# Patient Record
Sex: Female | Born: 1940 | Race: White | Hispanic: No | Marital: Married | Smoking: Never smoker
Health system: Southern US, Community
[De-identification: ages and names within clinical notes are randomized; demographics above are authoritative.]

## PROBLEM LIST (undated history)

## (undated) DIAGNOSIS — L259 Unspecified contact dermatitis, unspecified cause: Secondary | ICD-10-CM

## (undated) DIAGNOSIS — M7150 Other bursitis, not elsewhere classified, unspecified site: Secondary | ICD-10-CM

## (undated) DIAGNOSIS — R112 Nausea with vomiting, unspecified: Secondary | ICD-10-CM

## (undated) DIAGNOSIS — Z860100 Personal history of colon polyps, unspecified: Secondary | ICD-10-CM

## (undated) DIAGNOSIS — M4312 Spondylolisthesis, cervical region: Secondary | ICD-10-CM

## (undated) DIAGNOSIS — K8020 Calculus of gallbladder without cholecystitis without obstruction: Secondary | ICD-10-CM

## (undated) DIAGNOSIS — K219 Gastro-esophageal reflux disease without esophagitis: Secondary | ICD-10-CM

## (undated) DIAGNOSIS — E119 Type 2 diabetes mellitus without complications: Secondary | ICD-10-CM

## (undated) DIAGNOSIS — J45909 Unspecified asthma, uncomplicated: Secondary | ICD-10-CM

## (undated) DIAGNOSIS — I10 Essential (primary) hypertension: Secondary | ICD-10-CM

## (undated) DIAGNOSIS — M1990 Unspecified osteoarthritis, unspecified site: Secondary | ICD-10-CM

## (undated) DIAGNOSIS — E7800 Pure hypercholesterolemia, unspecified: Secondary | ICD-10-CM

## (undated) DIAGNOSIS — R7303 Prediabetes: Secondary | ICD-10-CM

## (undated) DIAGNOSIS — C801 Malignant (primary) neoplasm, unspecified: Secondary | ICD-10-CM

## (undated) DIAGNOSIS — F329 Major depressive disorder, single episode, unspecified: Secondary | ICD-10-CM

## (undated) DIAGNOSIS — E039 Hypothyroidism, unspecified: Secondary | ICD-10-CM

## (undated) DIAGNOSIS — M129 Arthropathy, unspecified: Secondary | ICD-10-CM

## (undated) DIAGNOSIS — G709 Myoneural disorder, unspecified: Secondary | ICD-10-CM

## (undated) DIAGNOSIS — C44329 Squamous cell carcinoma of skin of other parts of face: Secondary | ICD-10-CM

## (undated) DIAGNOSIS — E079 Disorder of thyroid, unspecified: Secondary | ICD-10-CM

## (undated) DIAGNOSIS — F32A Depression, unspecified: Secondary | ICD-10-CM

## (undated) DIAGNOSIS — Z9889 Other specified postprocedural states: Secondary | ICD-10-CM

## (undated) HISTORY — PX: WISDOM TOOTH EXTRACTION: SHX21

## (undated) HISTORY — PX: TONSILLECTOMY: SUR1361

## (undated) HISTORY — PX: ABDOMINAL HYSTERECTOMY: SHX81

## (undated) HISTORY — DX: Disorder of thyroid, unspecified: E07.9

## (undated) HISTORY — DX: Squamous cell carcinoma of skin of other parts of face: C44.329

## (undated) HISTORY — PX: TUBAL LIGATION: SHX77

## (undated) HISTORY — DX: Calculus of gallbladder without cholecystitis without obstruction: K80.20

## (undated) HISTORY — DX: Arthropathy, unspecified: M12.9

## (undated) HISTORY — DX: Personal history of colon polyps, unspecified: Z86.0100

## (undated) HISTORY — DX: Unspecified contact dermatitis, unspecified cause: L25.9

## (undated) HISTORY — DX: Pure hypercholesterolemia, unspecified: E78.00

## (undated) HISTORY — PX: EYE SURGERY: SHX253

## (undated) HISTORY — PX: COLONOSCOPY: SHX174

## (undated) HISTORY — DX: Unspecified asthma, uncomplicated: J45.909

## (undated) HISTORY — DX: Hypothyroidism, unspecified: E03.9

## (undated) HISTORY — DX: Personal history of colonic polyps: Z86.010

## (undated) HISTORY — PX: BREAST SURGERY: SHX581

## (undated) HISTORY — DX: Other bursitis, not elsewhere classified, unspecified site: M71.50

## (undated) HISTORY — DX: Type 2 diabetes mellitus without complications: E11.9

## (undated) HISTORY — PX: LUMBAR LAMINECTOMY: SHX95

---

## 1973-11-02 HISTORY — PX: OTHER SURGICAL HISTORY: SHX169

## 1999-02-11 ENCOUNTER — Inpatient Hospital Stay (HOSPITAL_COMMUNITY): Admission: EM | Admit: 1999-02-11 | Discharge: 1999-02-15 | Payer: Self-pay | Admitting: Emergency Medicine

## 1999-02-12 ENCOUNTER — Encounter: Payer: Self-pay | Admitting: Internal Medicine

## 1999-02-13 ENCOUNTER — Encounter: Payer: Self-pay | Admitting: Internal Medicine

## 1999-02-14 ENCOUNTER — Encounter: Payer: Self-pay | Admitting: Neurosurgery

## 1999-10-13 ENCOUNTER — Ambulatory Visit (HOSPITAL_COMMUNITY): Admission: RE | Admit: 1999-10-13 | Discharge: 1999-10-13 | Payer: Self-pay | Admitting: Neurosurgery

## 1999-10-13 ENCOUNTER — Encounter: Payer: Self-pay | Admitting: Neurosurgery

## 2000-08-09 ENCOUNTER — Encounter: Payer: Self-pay | Admitting: Gastroenterology

## 2000-08-10 ENCOUNTER — Other Ambulatory Visit: Admission: RE | Admit: 2000-08-10 | Discharge: 2000-08-10 | Payer: Self-pay | Admitting: Gastroenterology

## 2000-08-10 ENCOUNTER — Encounter (INDEPENDENT_AMBULATORY_CARE_PROVIDER_SITE_OTHER): Payer: Self-pay

## 2000-11-02 HISTORY — PX: BACK SURGERY: SHX140

## 2005-02-16 ENCOUNTER — Other Ambulatory Visit: Admission: RE | Admit: 2005-02-16 | Discharge: 2005-02-16 | Payer: Self-pay | Admitting: Obstetrics and Gynecology

## 2005-03-11 ENCOUNTER — Emergency Department (HOSPITAL_COMMUNITY): Admission: EM | Admit: 2005-03-11 | Discharge: 2005-03-11 | Payer: Self-pay | Admitting: Emergency Medicine

## 2005-05-18 ENCOUNTER — Ambulatory Visit: Payer: Self-pay | Admitting: Internal Medicine

## 2005-06-01 ENCOUNTER — Ambulatory Visit: Payer: Self-pay | Admitting: Internal Medicine

## 2005-08-19 ENCOUNTER — Ambulatory Visit: Payer: Self-pay | Admitting: Internal Medicine

## 2006-08-16 ENCOUNTER — Ambulatory Visit: Payer: Self-pay | Admitting: Internal Medicine

## 2006-08-16 LAB — CONVERTED CEMR LAB
ALT: 16 units/L (ref 0–40)
Alkaline Phosphatase: 70 units/L (ref 39–117)
BUN: 10 mg/dL (ref 6–23)
Basophils Relative: 0.4 % (ref 0.0–1.0)
Bilirubin Urine: NEGATIVE
CO2: 29 meq/L (ref 19–32)
Calcium: 9.6 mg/dL (ref 8.4–10.5)
Chol/HDL Ratio, serum: 5.4
Cholesterol: 206 mg/dL (ref 0–200)
Eosinophil percent: 4.4 % (ref 0.0–5.0)
HDL: 38.4 mg/dL — ABNORMAL LOW (ref >39.0)
Hemoglobin: 13.3 g/dL (ref 12.0–15.0)
Ketones, ur: NEGATIVE mg/dL
Lymphocytes Relative: 24.8 % (ref 12.0–46.0)
Neutrophils Relative %: 64.9 % (ref 43.0–77.0)
Nitrite: NEGATIVE
Potassium: 4 meq/L (ref 3.5–5.1)
TSH: 1.5 microintl units/mL (ref 0.35–5.50)
Total Protein, Urine: NEGATIVE mg/dL
Total Protein: 6.9 g/dL (ref 6.0–8.3)
Triglyceride fasting, serum: 98 mg/dL (ref 0–149)
WBC: 8.1 10*3/uL (ref 4.5–10.5)

## 2006-08-23 ENCOUNTER — Ambulatory Visit: Payer: Self-pay | Admitting: Internal Medicine

## 2007-09-16 ENCOUNTER — Encounter: Payer: Self-pay | Admitting: Internal Medicine

## 2007-09-17 ENCOUNTER — Encounter: Payer: Self-pay | Admitting: *Deleted

## 2007-09-17 DIAGNOSIS — Z9889 Other specified postprocedural states: Secondary | ICD-10-CM

## 2007-09-17 DIAGNOSIS — M7150 Other bursitis, not elsewhere classified, unspecified site: Secondary | ICD-10-CM | POA: Insufficient documentation

## 2007-09-17 DIAGNOSIS — E039 Hypothyroidism, unspecified: Secondary | ICD-10-CM | POA: Insufficient documentation

## 2007-09-17 DIAGNOSIS — E7800 Pure hypercholesterolemia, unspecified: Secondary | ICD-10-CM

## 2007-09-17 DIAGNOSIS — M19042 Primary osteoarthritis, left hand: Secondary | ICD-10-CM | POA: Insufficient documentation

## 2007-09-17 DIAGNOSIS — M19041 Primary osteoarthritis, right hand: Secondary | ICD-10-CM | POA: Insufficient documentation

## 2007-09-17 DIAGNOSIS — Z978 Presence of other specified devices: Secondary | ICD-10-CM

## 2007-09-19 ENCOUNTER — Telehealth (INDEPENDENT_AMBULATORY_CARE_PROVIDER_SITE_OTHER): Payer: Self-pay | Admitting: *Deleted

## 2007-10-03 ENCOUNTER — Ambulatory Visit: Payer: Self-pay | Admitting: Internal Medicine

## 2007-10-03 DIAGNOSIS — R7309 Other abnormal glucose: Secondary | ICD-10-CM

## 2007-10-03 DIAGNOSIS — E079 Disorder of thyroid, unspecified: Secondary | ICD-10-CM | POA: Insufficient documentation

## 2007-10-03 LAB — CONVERTED CEMR LAB
Albumin: 3.8 g/dL (ref 3.5–5.2)
Cholesterol: 202 mg/dL (ref 0–200)
Creatinine, Ser: 0.7 mg/dL (ref 0.4–1.2)
Direct LDL: 139.1 mg/dL
GFR calc non Af Amer: 89 mL/min
HDL: 42.3 mg/dL (ref >39.0)
Hgb A1c MFr Bld: 6 % (ref 4.6–6.0)
Potassium: 3.3 meq/L — ABNORMAL LOW (ref 3.5–5.1)
Sodium: 142 meq/L (ref 135–145)
Total Bilirubin: 1 mg/dL (ref 0.3–1.2)
Total CHOL/HDL Ratio: 4.8
VLDL: 15 mg/dL (ref 0–40)

## 2007-10-10 ENCOUNTER — Ambulatory Visit: Payer: Self-pay | Admitting: Internal Medicine

## 2007-10-10 DIAGNOSIS — M79609 Pain in unspecified limb: Secondary | ICD-10-CM | POA: Insufficient documentation

## 2007-10-10 DIAGNOSIS — I10 Essential (primary) hypertension: Secondary | ICD-10-CM

## 2007-10-10 DIAGNOSIS — M25519 Pain in unspecified shoulder: Secondary | ICD-10-CM | POA: Insufficient documentation

## 2007-10-11 ENCOUNTER — Encounter (INDEPENDENT_AMBULATORY_CARE_PROVIDER_SITE_OTHER): Payer: Self-pay | Admitting: *Deleted

## 2007-10-17 ENCOUNTER — Ambulatory Visit: Payer: Self-pay | Admitting: Internal Medicine

## 2007-10-17 ENCOUNTER — Encounter: Payer: Self-pay | Admitting: Internal Medicine

## 2007-10-24 ENCOUNTER — Encounter: Payer: Self-pay | Admitting: Internal Medicine

## 2007-11-22 ENCOUNTER — Encounter: Payer: Self-pay | Admitting: Internal Medicine

## 2007-11-28 ENCOUNTER — Ambulatory Visit: Payer: Self-pay | Admitting: Internal Medicine

## 2007-11-28 DIAGNOSIS — J069 Acute upper respiratory infection, unspecified: Secondary | ICD-10-CM | POA: Insufficient documentation

## 2007-11-29 LAB — CONVERTED CEMR LAB: CRP, High Sensitivity: 10.6 — ABNORMAL HIGH

## 2008-01-12 ENCOUNTER — Emergency Department (HOSPITAL_COMMUNITY): Admission: EM | Admit: 2008-01-12 | Discharge: 2008-01-12 | Payer: Self-pay | Admitting: Emergency Medicine

## 2008-01-29 ENCOUNTER — Emergency Department (HOSPITAL_COMMUNITY): Admission: EM | Admit: 2008-01-29 | Discharge: 2008-01-30 | Payer: Self-pay | Admitting: Emergency Medicine

## 2008-01-31 ENCOUNTER — Ambulatory Visit: Payer: Self-pay | Admitting: Internal Medicine

## 2008-02-27 ENCOUNTER — Ambulatory Visit: Payer: Self-pay | Admitting: Internal Medicine

## 2008-02-27 LAB — CONVERTED CEMR LAB
GFR calc Af Amer: 128 mL/min
Glucose, Bld: 103 mg/dL — ABNORMAL HIGH (ref 70–99)
Potassium: 4.3 meq/L (ref 3.5–5.1)
Sodium: 145 meq/L (ref 135–145)
Total CHOL/HDL Ratio: 4.4
VLDL: 19 mg/dL (ref 0–40)

## 2008-03-05 ENCOUNTER — Ambulatory Visit: Payer: Self-pay | Admitting: Internal Medicine

## 2008-03-05 DIAGNOSIS — L259 Unspecified contact dermatitis, unspecified cause: Secondary | ICD-10-CM

## 2009-02-06 ENCOUNTER — Encounter: Payer: Self-pay | Admitting: Internal Medicine

## 2009-02-07 ENCOUNTER — Encounter: Payer: Self-pay | Admitting: Internal Medicine

## 2009-02-11 ENCOUNTER — Ambulatory Visit: Payer: Self-pay | Admitting: Internal Medicine

## 2009-02-11 DIAGNOSIS — R5383 Other fatigue: Secondary | ICD-10-CM

## 2009-02-11 DIAGNOSIS — R5381 Other malaise: Secondary | ICD-10-CM

## 2009-02-12 ENCOUNTER — Encounter: Payer: Self-pay | Admitting: Internal Medicine

## 2009-03-06 ENCOUNTER — Encounter: Payer: Self-pay | Admitting: Internal Medicine

## 2009-03-18 ENCOUNTER — Ambulatory Visit: Payer: Self-pay | Admitting: Internal Medicine

## 2009-03-18 LAB — CONVERTED CEMR LAB
AST: 16 units/L (ref 0–37)
BUN: 15 mg/dL (ref 6–23)
Basophils Absolute: 0.1 10*3/uL (ref 0.0–0.1)
Bilirubin, Direct: 0.1 mg/dL (ref 0.0–0.3)
CO2: 30 meq/L (ref 19–32)
Direct LDL: 153 mg/dL
Glucose, Bld: 108 mg/dL — ABNORMAL HIGH (ref 70–99)
HCT: 38.2 % (ref 36.0–46.0)
HDL: 39.2 mg/dL (ref >39.00)
Hgb A1c MFr Bld: 6.5 % (ref 4.6–6.5)
Lymphocytes Relative: 21.9 % (ref 12.0–46.0)
Lymphs Abs: 2.1 10*3/uL (ref 0.7–4.0)
Monocytes Relative: 5.2 % (ref 3.0–12.0)
Neutrophils Relative %: 71.2 % (ref 43.0–77.0)
Platelets: 232 10*3/uL (ref 150.0–400.0)
Potassium: 4.4 meq/L (ref 3.5–5.1)
RDW: 12.8 % (ref 11.5–14.6)
Sodium: 144 meq/L (ref 135–145)
TSH: 3.26 microintl units/mL (ref 0.35–5.50)
Total Bilirubin: 0.8 mg/dL (ref 0.3–1.2)
Total CHOL/HDL Ratio: 5
VLDL: 24.6 mg/dL (ref 0.0–40.0)

## 2009-03-25 ENCOUNTER — Ambulatory Visit: Payer: Self-pay | Admitting: Internal Medicine

## 2009-05-10 ENCOUNTER — Telehealth: Payer: Self-pay | Admitting: Internal Medicine

## 2009-05-10 ENCOUNTER — Telehealth: Payer: Self-pay | Admitting: Gastroenterology

## 2009-05-10 DIAGNOSIS — K625 Hemorrhage of anus and rectum: Secondary | ICD-10-CM | POA: Insufficient documentation

## 2009-05-13 DIAGNOSIS — Z860100 Personal history of colon polyps, unspecified: Secondary | ICD-10-CM | POA: Insufficient documentation

## 2009-05-14 ENCOUNTER — Ambulatory Visit: Payer: Self-pay | Admitting: Gastroenterology

## 2009-05-14 DIAGNOSIS — K5900 Constipation, unspecified: Secondary | ICD-10-CM | POA: Insufficient documentation

## 2009-05-14 LAB — CONVERTED CEMR LAB
ALT: 17 units/L (ref 0–35)
AST: 18 units/L (ref 0–37)
BUN: 18 mg/dL (ref 6–23)
Bilirubin, Direct: 0.1 mg/dL (ref 0.0–0.3)
Chloride: 106 meq/L (ref 96–112)
Eosinophils Absolute: 0.1 10*3/uL (ref 0.0–0.7)
Iron: 45 ug/dL (ref 42–145)
MCHC: 34.6 g/dL (ref 30.0–36.0)
MCV: 88.9 fL (ref 78.0–100.0)
Monocytes Absolute: 0.6 10*3/uL (ref 0.1–1.0)
Neutrophils Relative %: 71.4 % (ref 43.0–77.0)
Platelets: 232 10*3/uL (ref 150.0–400.0)
Potassium: 4 meq/L (ref 3.5–5.1)
TSH: 3.32 microintl units/mL (ref 0.35–5.50)
Total Bilirubin: 0.8 mg/dL (ref 0.3–1.2)
WBC: 9.8 10*3/uL (ref 4.5–10.5)

## 2009-06-10 ENCOUNTER — Ambulatory Visit: Payer: Self-pay | Admitting: Gastroenterology

## 2009-06-25 ENCOUNTER — Encounter: Payer: Self-pay | Admitting: Internal Medicine

## 2009-06-25 LAB — HM DIABETES EYE EXAM: HM Diabetic Eye Exam: NORMAL

## 2009-06-26 ENCOUNTER — Encounter: Payer: Self-pay | Admitting: Internal Medicine

## 2009-08-09 ENCOUNTER — Encounter: Payer: Self-pay | Admitting: Internal Medicine

## 2009-08-19 ENCOUNTER — Encounter: Payer: Self-pay | Admitting: Internal Medicine

## 2009-08-20 ENCOUNTER — Encounter: Payer: Self-pay | Admitting: Internal Medicine

## 2009-08-28 ENCOUNTER — Telehealth: Payer: Self-pay | Admitting: Internal Medicine

## 2009-09-02 ENCOUNTER — Ambulatory Visit: Payer: Self-pay | Admitting: Internal Medicine

## 2009-09-02 DIAGNOSIS — R0601 Orthopnea: Secondary | ICD-10-CM

## 2009-09-02 DIAGNOSIS — R05 Cough: Secondary | ICD-10-CM

## 2009-09-11 ENCOUNTER — Encounter: Payer: Self-pay | Admitting: Internal Medicine

## 2009-09-11 ENCOUNTER — Ambulatory Visit: Payer: Self-pay

## 2009-09-11 ENCOUNTER — Ambulatory Visit (HOSPITAL_COMMUNITY): Admission: RE | Admit: 2009-09-11 | Discharge: 2009-09-11 | Payer: Self-pay | Admitting: Internal Medicine

## 2009-09-11 ENCOUNTER — Ambulatory Visit: Payer: Self-pay | Admitting: Internal Medicine

## 2009-09-11 ENCOUNTER — Telehealth: Payer: Self-pay | Admitting: Internal Medicine

## 2009-10-07 ENCOUNTER — Ambulatory Visit: Payer: Self-pay | Admitting: Pulmonary Disease

## 2009-10-07 DIAGNOSIS — J45909 Unspecified asthma, uncomplicated: Secondary | ICD-10-CM | POA: Insufficient documentation

## 2010-02-10 ENCOUNTER — Encounter: Payer: Self-pay | Admitting: Internal Medicine

## 2010-02-11 ENCOUNTER — Encounter: Payer: Self-pay | Admitting: Internal Medicine

## 2010-03-04 ENCOUNTER — Telehealth: Payer: Self-pay | Admitting: Internal Medicine

## 2010-05-01 ENCOUNTER — Ambulatory Visit: Payer: Self-pay | Admitting: Internal Medicine

## 2010-05-01 LAB — CONVERTED CEMR LAB
CO2: 22 meq/L (ref 19–32)
Glucose, Bld: 102 mg/dL — ABNORMAL HIGH (ref 70–99)
Potassium: 4.1 meq/L (ref 3.5–5.3)
Sodium: 143 meq/L (ref 135–145)

## 2010-05-02 ENCOUNTER — Telehealth: Payer: Self-pay | Admitting: Internal Medicine

## 2010-10-01 ENCOUNTER — Telehealth: Payer: Self-pay | Admitting: Internal Medicine

## 2010-10-01 ENCOUNTER — Ambulatory Visit: Payer: Self-pay | Admitting: Internal Medicine

## 2010-11-11 ENCOUNTER — Telehealth: Payer: Self-pay | Admitting: Internal Medicine

## 2010-12-04 NOTE — Assessment & Plan Note (Signed)
Summary: fu meds/dt   Vital Signs:  Patient profile:   70 year old female Height:      60 inches Weight:      139.50 pounds BMI:     27.34 O2 Sat:      96 % on Room air Temp:     97.6 degrees F oral Pulse rate:   87 / minute Pulse rhythm:   regular Resp:     16 per minute BP sitting:   116 / 70  (right arm) Cuff size:   regular  Vitals Entered By: Glendell Docker CMA (May 01, 2010 3:41 PM)  O2 Flow:  Room air CC: Rm 3- Meidcation Refill Is Patient Diabetic? No Comments medication reviewed no concerns   Primary Care Provider:  DThomos Lemons DO  CC:  Rm 3- Meidcation Refill.  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  dietary compliance is fair   Preventive Screening-Counseling & Management  Alcohol-Tobacco     Smoking Status: never  Allergies: 1)  ! Penicillin 2)  ! Codeine 3)  * Lisinopril  Past History:  Past Medical History:  ORTHOPNEA (ICD-786.02) COLONIC POLYPS, HX OF (ICD-V12.72) DERMATITIS (ICD-692.9) URI (ICD-465.9)  HYPERTENSION (ICD-401.9) UNSPECIFIED DISORDER OF THYROID (ICD-246.9) ARTHRITIS (ICD-716.90) TUBAL LIGATION, HX OF (ICD-V26.51) BREAST IMPLANTS, BILATERAL, HX OF (ICD-V43.82) HYPERCHOLESTEROLEMIA (ICD-272.0) LAMINECTOMY, LUMBAR, HX OF (ICD-V45.89) BURSITIS (ICD-727.3) HYPOTHYROIDISM (ICD-244.9)    Past Surgical History: TUBAL LIGATION, HX OF (ICD-V26.51) * Hx of BREAST IMPLANTS REMOVED BREAST IMPLANTS, BILATERAL, HX OF (ICD-V43.82) LAMINECTOMY, LUMBAR, HX OF (ICD-V45.89)        Family History: Father had a cerebrovascular accident and myocardial infarction at age 84 Mother is deceased with a history of breast cancer and lung cancer.  Sister is diabetic.   No FH of Colon Cancer: maternal aunt with emphysema     Social History: Occupation:  Publishing copy Married  with children. Never Smoked Alcohol use-no  Husband is  a smoker.   Illicit Drug Use - no     Review of Systems       intermittent insomnia.  wants to avoid sedatives  Physical Exam  General:  alert, well-developed, and well-nourished.   Lungs:  normal respiratory effort and normal breath sounds.   Heart:  normal rate, regular rhythm, and no gallop.   Neurologic:  cranial nerves II-XII intact and gait normal.   Psych:  normally interactive, good eye contact, not anxious appearing, and not depressed appearing.     Impression & Recommendations:  Problem # 1:  HYPOTHYROIDISM (ICD-244.9) monitor TSH.  adjust dose accordingly  Her updated medication list for this problem includes:    Synthroid 50 Mcg Tabs (Levothyroxine sodium) .Marland Kitchen... Take 1 tab by mouth once daily  Orders: T-TSH [phone])  Labs Reviewed: TSH: 3.32 (05/14/2009)    HgBA1c: 6.5 (03/18/2009) Chol: 207 (03/18/2009)   HDL: 39.20 (03/18/2009)   LDL: 105 (02/27/2008)   TG: 123.0 (03/18/2009)  Problem # 2:  OTHER ABNORMAL GLUCOSE (ICD-790.29) Pt counseled on diet and exercise.  Orders: T- Hemoglobin A1C ([id number]) T-Basic Metabolic Panel [phone])  Labs Reviewed: Creat: 0.8 (05/14/2009)     Last Eye Exam: normal (06/25/2009)  Complete Medication List: 1)  Synthroid 50 Mcg Tabs (Levothyroxine sodium) .... Take 1 tab by mouth once daily  Patient Instructions: 1)  Please schedule a follow-up appointment in 6 months. 2)  Limit your carbohydrate intake  to 30 grams per meal (100 grams per day) 3)  Avoid caffeinated beverages after 12 noon.  Current Allergies (reviewed today): ! PENICILLIN ! CODEINE * LISINOPRIL   Preventive Care Screening  Pap Smear:    Date:  02/10/2010    Results:  normal

## 2010-12-04 NOTE — Progress Notes (Signed)
Summary: Synthroid Refill  Phone Note Call from Patient Call back at [phone]   Caller: Patient Call For: [name] DO Summary of Call: patient called and left voice message requesting refill on Synthroid.  FYI patient has cancelled last 3 appointments and does not have one scheduled Initial call taken by: Glendell Docker CMA,  Mar 04, 2010 12:38 PM  Follow-up for Phone Call        refill x 1 only.  must have blood work and OV for further refillx Follow-up by: [name] DO,  Mar 04, 2010 1:21 PM  Additional Follow-up for Phone Call Additional follow up Details #1::        patient informed rx sent to pharmacy and office visit with blood wprk needed for future refills per Dr Artist Pais instructions. Patient verbalized understanding and states she will call back to schedule Additional Follow-up by: Glendell Docker CMA,  Mar 04, 2010 2:45 PM    Prescriptions: SYNTHROID 50 MCG  TABS (LEVOTHYROXINE SODIUM) Take 1/2  tablet by mouth once a day  #30 x 0   Entered by:   Glendell Docker CMA   Authorized by:   [name] DO   Signed by:   Glendell Docker CMA on 03/04/2010   Method used:   Electronically to        [facility]. [phone]* (retail)       [phone]       [place], [place]  [address]       Ph: [phone]       Fax: [phone]   RxID:   [id number]

## 2010-12-04 NOTE — Progress Notes (Signed)
Summary: Thyroid Refill  Phone Note Call from Patient Call back at [phone]   Summary of Call: patient called and left voice message requesting a refill on her thyroid medication.  Initial call taken by: Glendell Docker CMA,  October 01, 2010 10:53 AM  Follow-up for Phone Call        Call was returned to patient at [phone], she was advised that she was due for blood work in September. Patient acknowledged that she was to have blood work and states she just had not had the chance to have the blood work done. She was informed that she would need to have blood work done prior to medication being refilled. Patient verbalized understanding and states she will try to have it done today.  Follow-up by: Glendell Docker CMA,  October 01, 2010 10:57 AM  Additional Follow-up for Phone Call Additional follow up Details #1::        call pt - blood tests shows needs higher dose of synthroid. see rx I suggest we repeat TSH within 2-3 months  please confirm pt has been taking her medications regularly Additional Follow-up by: [name] DO,  October 01, 2010 7:34 PM    Additional Follow-up for Phone Call Additional follow up Details #2::    call was returned to patient at [phone], she has been informed per Dr Artist Pais instructions. Patients states that she had been taking a half tablet, but will pick the higher rx up today.  then she can take 1/2 of 75 mcg Follow-up by: Glendell Docker CMA,  October 02, 2010 10:06 AM  Additional Follow-up for Phone Call Additional follow up Details #3:: Details for Additional Follow-up Action Taken: call was returned to patient at  [phone], she has been advised per Dr. Artist Pais instructions to take 1/2 of the , patient has verbalized understanding Additional Follow-up by: Glendell Docker CMA,  October 02, 2010 2:45 PM  New/Updated Medications: SYNTHROID 75 MCG TABS (LEVOTHYROXINE SODIUM) one by mouth once daily Prescriptions: SYNTHROID 75 MCG TABS  (LEVOTHYROXINE SODIUM) one by mouth once daily  #30 x 3   Entered and Authorized by:   [name] DO   Signed by:   [name] DO on 10/01/2010   Method used:   Electronically to        [facility]. [phone]* (retail)       [phone]       [place], [place]  [address]       Ph: [phone]       Fax: [phone]   RxID:   [id number]

## 2010-12-04 NOTE — Miscellaneous (Signed)
Summary: Mammogram  Clinical Lists Changes  Observations: Added new observation of MAMMOGRAM: normal (02/10/2010 12:13)      Preventive Care Screening  Mammogram:    Date:  02/10/2010    Results:  normal

## 2010-12-04 NOTE — Progress Notes (Signed)
Summary: Hair Loss  Phone Note Call from Patient Call back at [phone]   Caller: Patient Call For: [name] DO Summary of Call: patient called and left voice message stating she is having a significant amount of hair loss. She states she is currently taking 75 micrograms of synthroid and cutting that in half. She would like to know if  there is a thyroid medication she could take that does not have any side effects Initial call taken by: Glendell Docker CMA,  November 11, 2010 11:04 AM  Follow-up for Phone Call        I suggest we try switching to armour thyroid.  however, armour thyroid may not be reimbursed by her ins co.   If pt will to pay for armour thyroid, I can send rx to pharm Follow-up by: [name] DO,  November 11, 2010 5:28 PM  Additional Follow-up for Phone Call Additional follow up Details #1::        call returned to patient at [phone], she has been advised per Dr Artist Pais instructions. Patient states that she is willing to pay out of pocket for the Armour Thyroid. She has been advised per Dr Artist Pais to return to 2 months for blood work to check her TSH. Patient has  verbalized understanding and agrees. Additional Follow-up by: Glendell Docker CMA,  November 11, 2010 5:36 PM    New/Updated Medications: ARMOUR THYROID 15 MG TABS (THYROID) Take 1 tablet by mouth once a day Prescriptions: ARMOUR THYROID 15 MG TABS (THYROID) Take 1 tablet by mouth once a day  #30 x 2   Entered by:   Glendell Docker CMA   Authorized by:   [name] DO   Signed by:   Glendell Docker CMA on 11/11/2010   Method used:   Electronically to        [facility]. [phone]* (retail)       [phone] [address]       [place], [place]  [address]       Ph: [phone]       Fax: [phone]   RxID:   [id number]

## 2010-12-04 NOTE — Progress Notes (Signed)
Summary: Lab Results  Phone Note Outgoing Call   Summary of Call: call pt - blood test shows she needs higher dose of thyroid medication.  Pt should take full tab of 50 mcg.  I suggest she return in 2 months for repeat TSH Initial call taken by: [name] DO,  May 02, 2010 10:18 AM  Follow-up for Phone Call        Left message on home phone to return my call. Nicki Guadalajara Fergerson CMA Duncan Dull)  May 02, 2010 11:11 AM   Additional Follow-up for Phone Call Additional follow up Details #1::        patient returned call and left voice message to contact her on her cell at [phone]. Call was placed to [phone],no answer. A detailed voice message was left informing patient per Dr Artist Pais instructions. Message left asking patient to return call with lab draw location. Additional Follow-up by: Glendell Docker CMA,  May 06, 2010 11:10 AM    Additional Follow-up for Phone Call Additional follow up Details #2::    call returned to patient she has been advised per Dr Artist Pais instructions. She will have her lab drawn at [facility] lab in September Follow-up by: Glendell Docker CMA,  May 08, 2010 8:32 AM  New/Updated Medications: SYNTHROID 50 MCG  TABS (LEVOTHYROXINE SODIUM) Take 1 tab by mouth once daily Prescriptions: SYNTHROID 50 MCG  TABS (LEVOTHYROXINE SODIUM) Take 1 tab by mouth once daily  #30 x 2   Entered and Authorized by:   [name] DO   Signed by:   [name] DO on 05/02/2010   Method used:   Electronically to        [facility]. [phone]* (retail)       [phone]       [place], [place]  [address]       Ph: [phone]       Fax: [phone]   RxID:   [id number]

## 2011-01-13 ENCOUNTER — Other Ambulatory Visit: Payer: Self-pay

## 2011-01-26 ENCOUNTER — Other Ambulatory Visit (INDEPENDENT_AMBULATORY_CARE_PROVIDER_SITE_OTHER): Payer: Medicare Other

## 2011-01-26 DIAGNOSIS — E039 Hypothyroidism, unspecified: Secondary | ICD-10-CM

## 2011-01-26 LAB — TSH: TSH: 4.65 u[IU]/mL (ref 0.35–5.50)

## 2011-01-27 ENCOUNTER — Telehealth: Payer: Self-pay | Admitting: Internal Medicine

## 2011-01-27 DIAGNOSIS — E039 Hypothyroidism, unspecified: Secondary | ICD-10-CM

## 2011-01-27 MED ORDER — THYROID 30 MG PO TABS: 30.0000 mg | ORAL_TABLET | Freq: Every day | ORAL | Status: DC

## 2011-01-27 NOTE — Telephone Encounter (Signed)
Call placed to patient at [phone], she has been informed per Dr Artist Pais instructions. Lab orders have been entered for May 2012

## 2011-01-27 NOTE — Telephone Encounter (Signed)
Call pt - recent thyroid blood test shows pt needs higher dose of thyroid medication.  See new rx.  Needs repeat TSH in 2 months

## 2011-01-27 NOTE — Telephone Encounter (Signed)
Left message on machine to return my call. 

## 2011-02-16 LAB — HM MAMMOGRAPHY

## 2011-02-19 ENCOUNTER — Encounter: Payer: Self-pay | Admitting: Internal Medicine

## 2011-03-18 ENCOUNTER — Encounter: Payer: Self-pay | Admitting: Internal Medicine

## 2011-05-13 ENCOUNTER — Other Ambulatory Visit: Payer: Self-pay | Admitting: Ophthalmology

## 2011-06-01 ENCOUNTER — Other Ambulatory Visit: Payer: Self-pay | Admitting: Internal Medicine

## 2011-06-01 NOTE — Telephone Encounter (Signed)
Refill denied office visit due. Last office visit 04/2010

## 2011-06-23 ENCOUNTER — Encounter: Payer: Self-pay | Admitting: Internal Medicine

## 2011-06-24 ENCOUNTER — Ambulatory Visit (INDEPENDENT_AMBULATORY_CARE_PROVIDER_SITE_OTHER): Payer: Medicare Other | Admitting: Internal Medicine

## 2011-06-24 VITALS — BP 130/80 | HR 72 | Temp 98.1°F | Resp 16 | Ht 61.0 in | Wt 148.0 lb

## 2011-06-24 DIAGNOSIS — M79609 Pain in unspecified limb: Secondary | ICD-10-CM

## 2011-06-24 DIAGNOSIS — G629 Polyneuropathy, unspecified: Secondary | ICD-10-CM

## 2011-06-24 DIAGNOSIS — E039 Hypothyroidism, unspecified: Secondary | ICD-10-CM

## 2011-06-24 DIAGNOSIS — R7309 Other abnormal glucose: Secondary | ICD-10-CM

## 2011-06-24 DIAGNOSIS — G589 Mononeuropathy, unspecified: Secondary | ICD-10-CM

## 2011-06-24 MED ORDER — THYROID 30 MG PO TABS: 30.0000 mg | ORAL_TABLET | Freq: Every day | ORAL | Status: DC

## 2011-06-24 NOTE — Assessment & Plan Note (Addendum)
Pt with intermittent pain in bother her feet.  She has occasional numbness in her toes.  I doubt peripheral neuropathy.  I suspect local compressive phenomenon.  Rule out B12 def. 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 Contact info for Dr. Victorino Dike provided

## 2011-06-24 NOTE — Patient Instructions (Signed)
We will contact you with your blood test results in October, 2012 Dr. Toni Arthurs - [facility]

## 2011-06-24 NOTE — Assessment & Plan Note (Signed)
Restart armour thyroid.  Levothyroxine stopped due side effect of hair loss. Arrange TSH in 4-6 weeks.  Adjust dose accordingly.

## 2011-06-24 NOTE — Progress Notes (Signed)
  Subjective:    Patient ID: ADAYA GARMANY, female    DOB: Jul 25, 1941, 70 y.o.   MRN: [id number]  HPI  70 y/o female with hx of hypothyroidism for follow up.  Overall doing well.  No sign wt change.  Ran out of thyroid replacement 3 weeks ago.   Still working as Producer, television/film/video.  She complains of chronic foot pain and intermittent numbness of her toes.  Borderline DM II - fairly healthy diet.  Due to f/u A1c  Review of Systems Negative for chest pain  Past Medical History  Diagnosis Date  . Orthopnea   . Personal history of colonic polyps   . Contact dermatitis and other eczema, due to unspecified cause   . Unspecified essential hypertension   . Unspecified disorder of thyroid   . Arthropathy, unspecified, site unspecified   . Pure hypercholesterolemia   . Unspecified hypothyroidism   . Other bursitis disorders     History   Social History  . Marital Status: Married    Spouse Name: N/A    Number of Children: N/A  . Years of Education: N/A   Occupational History  . Not on file.   Social History Main Topics  . Smoking status: Never Smoker   . Smokeless tobacco: Never Used  . Alcohol Use: No  . Drug Use: No  . Sexually Active:    Other Topics Concern  . Not on file   Social History Narrative  . No narrative on file    Past Surgical History  Procedure Date  . Tubal ligation   . Breast surgery     breast implants, later removed  . Lumbar laminectomy     Family History  Problem Relation Age of Onset  . Cancer Mother     breast and lung  . Stroke Father   . Heart attack Father   . Diabetes Sister   . Emphysema Maternal Aunt   . Colon cancer Neg Hx     Allergies  Allergen Reactions  . Codeine   . Lisinopril     REACTION: wheezing  . Penicillins     REACTION: causes tongue to swell    No current outpatient prescriptions on file prior to visit.    BP 130/80  Pulse 72  Temp 98.1 F (36.7 C)  Resp 16  Ht 5\' 1"  (1.549 m)  Wt 148 lb (67.132  kg)  BMI 27.96 kg/m2       Objective:   Physical Exam   Constitutional: Appears well-developed and well-nourished. No distress.  Eyes: Conjunctivae are normal. Pupils are equal, round, and reactive to light.  Neck: Normal range of motion. Neck supple. No thyromegaly present. No carotid bruit Cardiovascular: Normal rate, regular rhythm and normal heart sounds.  Exam reveals no gallop and no friction rub.   No murmur heard. Pulmonary/Chest: Effort normal and breath sounds normal.  No wheezes. No rales.  Foot exam:  Mild bilateral edema.  Mild sensation loss to monofilament in her toes bilaterally Skin: Skin is warm and dry.  Psychiatric: Normal mood and affect. Behavior is normal.       Assessment & Plan:

## 2011-07-29 ENCOUNTER — Other Ambulatory Visit (INDEPENDENT_AMBULATORY_CARE_PROVIDER_SITE_OTHER): Payer: Medicare Other

## 2011-07-29 DIAGNOSIS — E039 Hypothyroidism, unspecified: Secondary | ICD-10-CM

## 2011-07-29 DIAGNOSIS — R7309 Other abnormal glucose: Secondary | ICD-10-CM

## 2011-07-29 DIAGNOSIS — R209 Unspecified disturbances of skin sensation: Secondary | ICD-10-CM

## 2011-07-29 DIAGNOSIS — G589 Mononeuropathy, unspecified: Secondary | ICD-10-CM

## 2011-07-29 DIAGNOSIS — G629 Polyneuropathy, unspecified: Secondary | ICD-10-CM

## 2011-07-29 DIAGNOSIS — M79609 Pain in unspecified limb: Secondary | ICD-10-CM

## 2011-07-29 LAB — TSH: TSH: 3.02 u[IU]/mL (ref 0.35–5.50)

## 2011-07-30 LAB — BASIC METABOLIC PANEL WITH GFR
Calcium: 9.6 mg/dL (ref 8.4–10.5)
GFR, Est African American: >60 mL/min (ref >60)
Glucose, Bld: 130 mg/dL — ABNORMAL HIGH (ref 70–99)
Sodium: 140 mEq/L (ref 135–145)

## 2011-07-31 ENCOUNTER — Other Ambulatory Visit: Payer: Self-pay | Admitting: Internal Medicine

## 2011-07-31 ENCOUNTER — Encounter: Payer: Self-pay | Admitting: Internal Medicine

## 2011-07-31 MED ORDER — THYROID 30 MG PO TABS: 30.0000 mg | ORAL_TABLET | Freq: Every day | ORAL | Status: DC

## 2011-08-03 ENCOUNTER — Other Ambulatory Visit: Payer: Self-pay | Admitting: Dermatology

## 2011-10-23 ENCOUNTER — Encounter: Payer: Self-pay | Admitting: Internal Medicine

## 2011-10-23 ENCOUNTER — Ambulatory Visit (INDEPENDENT_AMBULATORY_CARE_PROVIDER_SITE_OTHER): Payer: Medicare Other | Admitting: Internal Medicine

## 2011-10-23 DIAGNOSIS — M25552 Pain in left hip: Secondary | ICD-10-CM | POA: Insufficient documentation

## 2011-10-23 DIAGNOSIS — M25519 Pain in unspecified shoulder: Secondary | ICD-10-CM

## 2011-10-23 DIAGNOSIS — R7309 Other abnormal glucose: Secondary | ICD-10-CM

## 2011-10-23 DIAGNOSIS — M25511 Pain in right shoulder: Secondary | ICD-10-CM

## 2011-10-23 DIAGNOSIS — M25559 Pain in unspecified hip: Secondary | ICD-10-CM

## 2011-10-23 MED ORDER — OMEPRAZOLE 40 MG PO CPDR: 40.0000 mg | DELAYED_RELEASE_CAPSULE | Freq: Every day | ORAL | Status: DC

## 2011-10-23 MED ORDER — METFORMIN HCL 500 MG PO TABS: 500.0000 mg | ORAL_TABLET | Freq: Two times a day (BID) | ORAL | Status: DC

## 2011-10-23 NOTE — Progress Notes (Signed)
Subjective:    Patient ID: DAJSHA MASSARO, female    DOB: 1941/06/20, 70 y.o.   MRN: [id number]  HPI  70 year old white female complains of intermittent right arm pain. It has been ongoing for the last one month. She works as a Interior and spatial designer and reports her right arm feels heavy with abduction. She denies localized pain. No precipitating injury or trauma.  Interval medical history-she was seen by orthopedist regarding left hip pain. She was given cortisone injection and a prescription for diclofenac 75 mg twice daily. Her hip pain has significantly improved. She notes mild stomach discomfort with taking diclofenac.  We reviewed previous labs. Her A1c is worse at 6.4. Her sister is diabetic on insulin.  Review of Systems Negative for chest pain,   Negative of unusual shortness of breath  Past Medical History  Diagnosis Date  . Orthopnea   . Personal history of colonic polyps   . Contact dermatitis and other eczema, due to unspecified cause   . Unspecified essential hypertension   . Unspecified disorder of thyroid   . Arthropathy, unspecified, site unspecified   . Pure hypercholesterolemia   . Unspecified hypothyroidism   . Other bursitis disorders     History   Social History  . Marital Status: Married    Spouse Name: N/A    Number of Children: N/A  . Years of Education: N/A   Occupational History  . Not on file.   Social History Main Topics  . Smoking status: Never Smoker   . Smokeless tobacco: Never Used  . Alcohol Use: No  . Drug Use: No  . Sexually Active:    Other Topics Concern  . Not on file   Social History Narrative  . No narrative on file    Past Surgical History  Procedure Date  . Tubal ligation   . Breast surgery     breast implants, later removed  . Lumbar laminectomy     Family History  Problem Relation Age of Onset  . Cancer Mother     breast and lung  . Stroke Father   . Heart attack Father   . Diabetes Sister   . Emphysema Maternal  Aunt   . Colon cancer Neg Hx     Allergies  Allergen Reactions  . Codeine   . Lisinopril     REACTION: wheezing  . Penicillins     REACTION: causes tongue to swell    Current Outpatient Prescriptions on File Prior to Visit  Medication Sig Dispense Refill  . thyroid (ARMOUR) 30 MG tablet Take 1 tablet (30 mg total) by mouth daily.  90 tablet  1    BP 128/80  Temp(Src) 97.9 F (36.6 C) (Oral)  Ht 5\' 1"  (1.549 m)  Wt 151 lb (68.493 kg)  BMI 28.53 kg/m2     Objective:   Physical Exam  Constitutional: She is oriented to person, place, and time. She appears well-developed and well-nourished.  HENT:  Head: Normocephalic and atraumatic.  Cardiovascular: Normal rate, regular rhythm and normal heart sounds.  Exam reveals no friction rub.   No murmur heard. Pulmonary/Chest: Effort normal and breath sounds normal. She has no wheezes. She has no rales.  Abdominal: Soft. Bowel sounds are normal.  Musculoskeletal:       Patient able to abduct right shoulder without difficulty. Mild tenderness at right a.c. joint.  Neurological: She is alert and oriented to person, place, and time.  Skin: Skin is warm and dry.  Psychiatric: She has  a normal mood and affect. Her behavior is normal.          Assessment & Plan:

## 2011-10-23 NOTE — Assessment & Plan Note (Signed)
Patient reports seeing an orthopedic physician within the last month for left hip pain. She was placed on diclofenac 75 mg twice a day. Her symptoms have improved. She is having some GI upset. I advised starting omeprazole for GI protection. Also we discussed the importance of monitoring renal function.

## 2011-10-23 NOTE — Assessment & Plan Note (Signed)
70 year old white female with right shoulder pain consistent with rotator cuff tendinitis. Continue diclofenac 75 mg twice a day for now. She defers referral to orthopedics for cortisone injection. She will let us know if her symptoms get worse.

## 2011-10-23 NOTE — Patient Instructions (Signed)
Please complete the following lab tests before your next follow up appointment: BMET, A1c - 790.29 TSH - 244.9 Call our office if your right shoulder pain gets worse.

## 2011-10-23 NOTE — Assessment & Plan Note (Signed)
Recent A1c is getting worse is 6.4. Start metformin 500 mg twice daily. Stressed importance of avoiding sweets and following reduced carbohydrate diet. Monitor A1c before next office visit.

## 2011-10-26 ENCOUNTER — Other Ambulatory Visit: Payer: Self-pay | Admitting: Internal Medicine

## 2011-10-26 DIAGNOSIS — E785 Hyperlipidemia, unspecified: Secondary | ICD-10-CM

## 2012-03-31 ENCOUNTER — Other Ambulatory Visit: Payer: Self-pay | Admitting: Orthopaedic Surgery

## 2012-03-31 DIAGNOSIS — M549 Dorsalgia, unspecified: Secondary | ICD-10-CM

## 2012-04-05 ENCOUNTER — Ambulatory Visit
Admission: RE | Admit: 2012-04-05 | Discharge: 2012-04-05 | Disposition: A | Discharge status: Home / Self Care | Payer: Medicare Other | Source: Ambulatory Visit | Attending: Orthopaedic Surgery | Admitting: Orthopaedic Surgery

## 2012-04-05 DIAGNOSIS — M549 Dorsalgia, unspecified: Secondary | ICD-10-CM

## 2012-04-05 MED ORDER — GADOBENATE DIMEGLUMINE 529 MG/ML IV SOLN
12.0000 mL | Freq: Once | INTRAVENOUS | Status: AC | PRN
  Administered 2012-04-05: 12 mL via INTRAVENOUS

## 2012-04-21 ENCOUNTER — Other Ambulatory Visit: Payer: Self-pay | Admitting: Internal Medicine

## 2012-04-24 ENCOUNTER — Other Ambulatory Visit: Payer: Self-pay | Admitting: Internal Medicine

## 2012-04-27 ENCOUNTER — Other Ambulatory Visit: Payer: Self-pay | Admitting: Internal Medicine

## 2012-07-12 ENCOUNTER — Telehealth: Payer: Self-pay | Admitting: Family Medicine

## 2012-07-12 NOTE — Telephone Encounter (Signed)
Please advise 

## 2012-07-12 NOTE — Telephone Encounter (Signed)
[facility] Triage Call Report Triage Record Num: [id number] Operator: Sula Rumple Patient Name: Julie [name] Call Date & Time: 07/11/2012 7:12:39PM Patient Phone: [phone] PCP: Thomos Lemons Patient Gender: Female PCP Fax : [phone] Patient DOB: 1941/06/24 [facility] Name[facility] Reason for Call: Caller: Breawna/Patient; PCP: Ethelene Hal Molly Maduro) (Adults only); CB#: [phone]; Call regarding Not sleeping; Pt calling on 07/11/12 states she had called office earlier today [id number] to help her sleep/pt missed a callback from the office/is still wanting something for sleep/she was advised to call in AM Protocol(s) Used: Medication Questions - Adult Recommended Outcome per Protocol: Provided Health Information Reason for Outcome: Caller has medication question(s) that was answered with available resources Care Advice: ~ 09/

## 2012-07-14 MED ORDER — ZOLPIDEM TARTRATE 5 MG PO TABS: 5.0000 mg | ORAL_TABLET | Freq: Every evening | ORAL | Status: DC | PRN

## 2012-07-14 NOTE — Addendum Note (Signed)
Addended by: Alfred Levins D on: 07/14/2012 05:25 PM   Modules accepted: Orders

## 2012-07-14 NOTE — Telephone Encounter (Signed)
rx called in, pt aware 

## 2012-07-14 NOTE — Telephone Encounter (Signed)
Pt has always been a light sleeper.  Nothing is going on in her life.  She thinks it might be her age.

## 2012-07-14 NOTE — Telephone Encounter (Signed)
Try taking over the counter melatonin for 1 -2 weeks.  If no improvement, ok to send rx for zolpidem 5mg  one po qhs prn.  # 30. No RF.   If she uses medication, I suggest OV within 1 month of starting medication

## 2012-08-20 ENCOUNTER — Other Ambulatory Visit: Payer: Self-pay | Admitting: Internal Medicine

## 2012-09-05 ENCOUNTER — Ambulatory Visit (INDEPENDENT_AMBULATORY_CARE_PROVIDER_SITE_OTHER): Payer: Medicare Other | Admitting: Internal Medicine

## 2012-09-05 ENCOUNTER — Encounter: Payer: Self-pay | Admitting: Internal Medicine

## 2012-09-05 VITALS — BP 132/76 | HR 76 | Temp 97.8°F | Wt 132.0 lb

## 2012-09-05 DIAGNOSIS — M1990 Unspecified osteoarthritis, unspecified site: Secondary | ICD-10-CM

## 2012-09-05 DIAGNOSIS — E039 Hypothyroidism, unspecified: Secondary | ICD-10-CM

## 2012-09-05 DIAGNOSIS — M949 Disorder of cartilage, unspecified: Secondary | ICD-10-CM

## 2012-09-05 DIAGNOSIS — R7309 Other abnormal glucose: Secondary | ICD-10-CM

## 2012-09-05 DIAGNOSIS — M109 Gout, unspecified: Secondary | ICD-10-CM

## 2012-09-05 DIAGNOSIS — F5104 Psychophysiologic insomnia: Secondary | ICD-10-CM

## 2012-09-05 DIAGNOSIS — M8580 Other specified disorders of bone density and structure, unspecified site: Secondary | ICD-10-CM

## 2012-09-05 DIAGNOSIS — G4700 Insomnia, unspecified: Secondary | ICD-10-CM

## 2012-09-05 LAB — BASIC METABOLIC PANEL
CO2: 25 mEq/L (ref 19–32)
Chloride: 105 mEq/L (ref 96–112)
Creatinine, Ser: 0.8 mg/dL (ref 0.4–1.2)
Potassium: 4.1 mEq/L (ref 3.5–5.1)
Sodium: 138 mEq/L (ref 135–145)

## 2012-09-05 LAB — HEMOGLOBIN A1C: Hgb A1c MFr Bld: 6.3 % (ref 4.6–6.5)

## 2012-09-05 LAB — TSH: TSH: 0.44 u[IU]/mL (ref 0.35–5.50)

## 2012-09-05 LAB — URIC ACID: Uric Acid, Serum: 3 mg/dL (ref 2.4–7.0)

## 2012-09-05 MED ORDER — ALLOPURINOL 100 MG PO TABS: 200.0000 mg | ORAL_TABLET | Freq: Every day | ORAL | Status: DC

## 2012-09-05 MED ORDER — ZOLPIDEM TARTRATE 10 MG PO TABS: 10.0000 mg | ORAL_TABLET | Freq: Every evening | ORAL | Status: DC | PRN

## 2012-09-05 NOTE — Assessment & Plan Note (Signed)
Monitor thyroid function test.

## 2012-09-05 NOTE — Progress Notes (Signed)
Subjective:    Patient ID: Julie [name], female    DOB: Mar 25, 1941, 71 y.o.   MRN: [id number]  HPI  71 year old white female for follow up. Patient was seen by neurosurgeon due to chronic low back pain. She received two out of three epidural steroid injections. She notes mild improvement for several months but symptoms seem to recur.  Since previous visit patient was seen in urgent care for right foot pain. Presumptive diagnosis of gout was made. She was started on allopurinol 100 mg once daily. Patient reports no further flares of gout since starting allopurinol. She is due for uric acid level.  She was also seen by orthopedic specialist for chronic left knee pain. She has significant end-stage degenerative joint disease. She is currently taking NSAIDs. She denies abdominal pain. No melena.  Insomnia-current dose of zolpidem is not working. (5 mg)  Review of Systems Negative for chest pain,  Negative for abdominal pain  Past Medical History  Diagnosis Date  . Orthopnea   . Personal history of colonic polyps   . Contact dermatitis and other eczema, due to unspecified cause   . Unspecified essential hypertension   . Unspecified disorder of thyroid   . Arthropathy, unspecified, site unspecified   . Pure hypercholesterolemia   . Unspecified hypothyroidism   . Other bursitis disorders     History   Social History  . Marital Status: Married    Spouse Name: N/A    Number of Children: N/A  . Years of Education: N/A   Occupational History  . Not on file.   Social History Main Topics  . Smoking status: Never Smoker   . Smokeless tobacco: Never Used  . Alcohol Use: No  . Drug Use: No  . Sexually Active:    Other Topics Concern  . Not on file   Social History Narrative  . No narrative on file    Past Surgical History  Procedure Date  . Tubal ligation   . Breast surgery     breast implants, later removed  . Lumbar laminectomy     Family History  Problem  Relation Age of Onset  . Cancer Mother     breast and lung  . Stroke Father   . Heart attack Father   . Diabetes Sister   . Emphysema Maternal Aunt   . Colon cancer Neg Hx     Allergies  Allergen Reactions  . Codeine   . Lisinopril     REACTION: wheezing  . Penicillins     REACTION: causes tongue to swell    Current Outpatient Prescriptions on File Prior to Visit  Medication Sig Dispense Refill  . allopurinol (ZYLOPRIM) 100 MG tablet Take 2 tablets (200 mg total) by mouth daily.  180 tablet  1  . ARMOUR THYROID 30 MG tablet take 1 tablet by mouth once daily  90 tablet  1  . omeprazole (PRILOSEC) 40 MG capsule take 1 capsule by mouth once daily  90 capsule  1    BP 132/76  Pulse 76  Temp 97.8 F (36.6 C) (Oral)  Wt 132 lb (59.875 kg)       Objective:   Physical Exam  Constitutional: She appears well-developed and well-nourished.  Cardiovascular: Normal rate, regular rhythm and normal heart sounds.   Pulmonary/Chest: Effort normal and breath sounds normal. She has no wheezes.  Abdominal: Soft. Bowel sounds are normal. There is no tenderness.  Musculoskeletal: She exhibits no edema.  Bilateral Heberden's nodes  Neurological:       Walks with limp due to left knee pain  Skin: Skin is warm and dry.  Psychiatric: She has a normal mood and affect. Her behavior is normal.          Assessment & Plan:

## 2012-09-05 NOTE — Assessment & Plan Note (Signed)
Patient having insufficient response to 5 mg of zolpidem. Increase to 10 mg. We discussed potential side effects.

## 2012-09-05 NOTE — Assessment & Plan Note (Signed)
Patient has severe osteoarthritis of left knee. She is followed by orthopedic specialist. She is trying Synvisc injections. She may eventually need total knee replacement. She is currently taking NSAIDs. Monitor kidney function. Continue PPI for gastric protection.

## 2012-09-05 NOTE — Assessment & Plan Note (Signed)
Patient reports she was diagnosed with gout of right foot several months ago at local urgent care. She is currently on 100 mg of allopurinol. She reports last uric acid level 7.4. Increase allopurinol to 200 mg. Monitor uric acid levels.

## 2012-09-05 NOTE — Assessment & Plan Note (Signed)
Monitor A1c. We discussed possibility that steroid epidural injections may exacerbate her blood sugars.

## 2012-09-07 ENCOUNTER — Telehealth: Payer: Self-pay | Admitting: Internal Medicine

## 2012-09-07 NOTE — Telephone Encounter (Signed)
Caller: Kissa/Patient; Patient Name: Julie [name]; PCP: Artist Pais, Doe-Hyun Molly Maduro) (Adults only); Best Callback Phone Number: [phone].  Patient is returning office call from [place].  Denies need for triage-was seen 09/05/12 by provider.  In review Epic for patient it was noted the following:  Notes Recorded by Lavada Mesi, CMA on 09/06/2012 at 4:59 PM Left message for pt to call back  Notes Recorded by Doe-Hyun Sherran Needs, DO on 09/05/2012 at 5:46 PM Call pt - uric acid level is 3.  Ok to continue just 100 mg of allopurinol.  A1c stable at 6.3  Electrolytes and kidney function are stable.     These results/instructions were given to the patient who demonstrated understanding.

## 2012-10-18 ENCOUNTER — Encounter (HOSPITAL_COMMUNITY): Payer: Self-pay | Admitting: *Deleted

## 2012-10-18 ENCOUNTER — Ambulatory Visit (HOSPITAL_COMMUNITY)
Admission: RE | Admit: 2012-10-18 | Discharge: 2012-10-18 | Disposition: A | Discharge status: Home / Self Care | Payer: Medicare Other | Source: Ambulatory Visit | Attending: Orthopaedic Surgery | Admitting: Orthopaedic Surgery

## 2012-10-18 ENCOUNTER — Other Ambulatory Visit (HOSPITAL_COMMUNITY): Payer: Self-pay

## 2012-10-18 ENCOUNTER — Encounter (HOSPITAL_COMMUNITY): Payer: Self-pay | Admitting: Pharmacy Technician

## 2012-10-18 ENCOUNTER — Encounter (HOSPITAL_COMMUNITY)
Admission: RE | Admit: 2012-10-18 | Discharge: 2012-10-18 | Disposition: A | Discharge status: Home / Self Care | Payer: Medicare Other | Source: Ambulatory Visit | Attending: Orthopaedic Surgery | Admitting: Orthopaedic Surgery

## 2012-10-18 ENCOUNTER — Other Ambulatory Visit (HOSPITAL_COMMUNITY): Payer: Self-pay | Admitting: Orthopaedic Surgery

## 2012-10-18 ENCOUNTER — Other Ambulatory Visit: Payer: Self-pay

## 2012-10-18 DIAGNOSIS — Z01812 Encounter for preprocedural laboratory examination: Secondary | ICD-10-CM | POA: Insufficient documentation

## 2012-10-18 DIAGNOSIS — Z01818 Encounter for other preprocedural examination: Secondary | ICD-10-CM | POA: Insufficient documentation

## 2012-10-18 DIAGNOSIS — IMO0002 Reserved for concepts with insufficient information to code with codable children: Secondary | ICD-10-CM | POA: Insufficient documentation

## 2012-10-18 DIAGNOSIS — X58XXXA Exposure to other specified factors, initial encounter: Secondary | ICD-10-CM | POA: Insufficient documentation

## 2012-10-18 LAB — CBC
Hemoglobin: 13.1 g/dL (ref 12.0–15.0)
MCH: 29.6 pg (ref 26.0–34.0)
MCHC: 33.1 g/dL (ref 30.0–36.0)
MCV: 89.6 fL (ref 78.0–100.0)
RBC: 4.42 MIL/uL (ref 3.87–5.11)

## 2012-10-18 NOTE — Patient Instructions (Signed)
20 Julie [name]  10/18/2012   Your procedure is scheduled on:  Friday, October 21, 2012  Report to [facility] at  AM.215 p.m.  Call this number if you have problems the morning of surgery: [phone]   Remember:   Do eat food After Midnight 12-19, 2013 Thursday night, may have clear liquids until 1045 a.m. Then nothing until after surgery      Take these medicines the morning of surgery with A SIP OF WATER: Thyroid med,Omepazole and may take Tramadol with sip of water   Do not wear jewelry, make-up or nail polish.  Do not wear lotions, powders, or perfumes. You may wear deodorant.  Do not shave 48 hours prior to surgery. Men may shave face and neck.  Do not bring valuables to the hospital.  Contacts, dentures or bridgework may not be worn into surgery.  Leave suitcase in the car. After surgery it may be brought to your room.  For patients admitted to the hospital, checkout time is 11:00 AM the day of discharge.   Patients discharged the day of surgery will not be allowed to drive home.  Name and phone number of your driver: Camelia Eng mother in law [phone]  See [facility] Preparing for surgery sheet.   Please read over the following fact sheets that you were given: MRSA Information

## 2012-10-20 NOTE — Progress Notes (Signed)
CXR and EKG from 10-18-2012 in Epic

## 2012-10-21 ENCOUNTER — Ambulatory Visit (HOSPITAL_COMMUNITY)
Admission: RE | Admit: 2012-10-21 | Discharge: 2012-10-21 | Disposition: A | Discharge status: Home / Self Care | Payer: Medicare Other | Source: Ambulatory Visit | Attending: Orthopaedic Surgery | Admitting: Orthopaedic Surgery

## 2012-10-21 ENCOUNTER — Encounter (HOSPITAL_COMMUNITY): Payer: Self-pay | Admitting: Anesthesiology

## 2012-10-21 ENCOUNTER — Encounter (HOSPITAL_COMMUNITY): Payer: Self-pay | Admitting: *Deleted

## 2012-10-21 ENCOUNTER — Ambulatory Visit (HOSPITAL_COMMUNITY): Payer: Medicare Other | Admitting: Anesthesiology

## 2012-10-21 ENCOUNTER — Encounter (HOSPITAL_COMMUNITY)
Admission: RE | Disposition: A | Discharge status: Home / Self Care | Payer: Self-pay | Source: Ambulatory Visit | Attending: Orthopaedic Surgery

## 2012-10-21 DIAGNOSIS — M12269 Villonodular synovitis (pigmented), unspecified knee: Secondary | ICD-10-CM | POA: Insufficient documentation

## 2012-10-21 DIAGNOSIS — E7800 Pure hypercholesterolemia, unspecified: Secondary | ICD-10-CM | POA: Insufficient documentation

## 2012-10-21 DIAGNOSIS — M12869 Other specific arthropathies, not elsewhere classified, unspecified knee: Secondary | ICD-10-CM | POA: Insufficient documentation

## 2012-10-21 DIAGNOSIS — E039 Hypothyroidism, unspecified: Secondary | ICD-10-CM | POA: Insufficient documentation

## 2012-10-21 DIAGNOSIS — Z79899 Other long term (current) drug therapy: Secondary | ICD-10-CM | POA: Insufficient documentation

## 2012-10-21 DIAGNOSIS — S83209A Unspecified tear of unspecified meniscus, current injury, unspecified knee, initial encounter: Secondary | ICD-10-CM

## 2012-10-21 HISTORY — PX: KNEE ARTHROSCOPY: SHX127

## 2012-10-21 HISTORY — DX: Other specified postprocedural states: Z98.890

## 2012-10-21 HISTORY — DX: Nausea with vomiting, unspecified: R11.2

## 2012-10-21 SURGERY — ARTHROSCOPY, KNEE
Anesthesia: General | Site: Knee | Laterality: Left | Wound class: Clean

## 2012-10-21 MED ORDER — MORPHINE SULFATE 4 MG/ML IJ SOLN
INTRAMUSCULAR | Status: DC | PRN
  Administered 2012-10-21: 4 mg via SUBCUTANEOUS

## 2012-10-21 MED ORDER — HYDROCODONE-ACETAMINOPHEN 5-325 MG PO TABS: 1.0000 | ORAL_TABLET | ORAL | Status: DC | PRN

## 2012-10-21 MED ORDER — ONDANSETRON HCL 4 MG/2ML IJ SOLN
INTRAMUSCULAR | Status: DC | PRN
  Administered 2012-10-21: 4 mg via INTRAVENOUS

## 2012-10-21 MED ORDER — LIDOCAINE HCL 1 % IJ SOLN
INTRAMUSCULAR | Status: DC | PRN
  Administered 2012-10-21: 80 mg via INTRADERMAL

## 2012-10-21 MED ORDER — KETOROLAC TROMETHAMINE 30 MG/ML IJ SOLN: 15.0000 mg | Freq: Once | INTRAMUSCULAR | Status: DC | PRN

## 2012-10-21 MED ORDER — ACETAMINOPHEN 10 MG/ML IV SOLN
INTRAVENOUS | Status: DC | PRN
  Administered 2012-10-21: 1000 mg via INTRAVENOUS

## 2012-10-21 MED ORDER — FENTANYL CITRATE 0.05 MG/ML IJ SOLN
INTRAMUSCULAR | Status: DC | PRN
  Administered 2012-10-21: 50 ug via INTRAVENOUS
  Administered 2012-10-21 (×2): 25 ug via INTRAVENOUS

## 2012-10-21 MED ORDER — BUPIVACAINE HCL (PF) 0.5 % IJ SOLN
INTRAMUSCULAR | Status: DC | PRN
  Administered 2012-10-21: 20 mL

## 2012-10-21 MED ORDER — LACTATED RINGERS IV SOLN
INTRAVENOUS | Status: DC | PRN
  Administered 2012-10-21: 16:00:00 via INTRAVENOUS

## 2012-10-21 MED ORDER — TRAMADOL HCL 50 MG PO TABS: 100.0000 mg | ORAL_TABLET | Freq: Four times a day (QID) | ORAL | Status: DC | PRN

## 2012-10-21 MED ORDER — ONDANSETRON HCL 4 MG PO TABS: 4.0000 mg | ORAL_TABLET | Freq: Three times a day (TID) | ORAL | Status: DC | PRN

## 2012-10-21 MED ORDER — PROMETHAZINE HCL 25 MG/ML IJ SOLN: 6.2500 mg | INTRAMUSCULAR | Status: DC | PRN

## 2012-10-21 MED ORDER — ACETAMINOPHEN 10 MG/ML IV SOLN
INTRAVENOUS | Status: AC
  Filled 2012-10-21: qty 100

## 2012-10-21 MED ORDER — BUPIVACAINE HCL (PF) 0.5 % IJ SOLN
INTRAMUSCULAR | Status: AC
  Filled 2012-10-21: qty 30

## 2012-10-21 MED ORDER — PROPOFOL 10 MG/ML IV EMUL
INTRAVENOUS | Status: DC | PRN
  Administered 2012-10-21: 30 mg via INTRAVENOUS
  Administered 2012-10-21: 170 mg via INTRAVENOUS

## 2012-10-21 MED ORDER — CLINDAMYCIN PHOSPHATE 900 MG/50ML IV SOLN
INTRAVENOUS | Status: DC | PRN
  Administered 2012-10-21: 900 mg via INTRAVENOUS

## 2012-10-21 MED ORDER — ONDANSETRON 4 MG PO TBDP: 4.0000 mg | ORAL_TABLET | Freq: Three times a day (TID) | ORAL | Status: DC | PRN

## 2012-10-21 MED ORDER — MORPHINE SULFATE 4 MG/ML IJ SOLN
INTRAMUSCULAR | Status: AC
  Filled 2012-10-21: qty 1

## 2012-10-21 MED ORDER — CLINDAMYCIN PHOSPHATE 900 MG/50ML IV SOLN
INTRAVENOUS | Status: AC
  Filled 2012-10-21: qty 50

## 2012-10-21 MED ORDER — LACTATED RINGERS IR SOLN
Status: DC | PRN
  Administered 2012-10-21: 6000 mL

## 2012-10-21 MED ORDER — FENTANYL CITRATE 0.05 MG/ML IJ SOLN: 25.0000 ug | INTRAMUSCULAR | Status: DC | PRN

## 2012-10-21 SURGICAL SUPPLY — 30 items
BANDAGE ELASTIC 6 VELCRO ST LF (GAUZE/BANDAGES/DRESSINGS) ×2 IMPLANT
BLADE CUDA SHAVER 3.5 (BLADE) ×2 IMPLANT
CLOTH BEACON ORANGE TIMEOUT ST (SAFETY) ×2 IMPLANT
COUNTER NEEDLE 20 DBL MAG RED (NEEDLE) ×2 IMPLANT
DRAPE U-SHAPE 47X51 STRL (DRAPES) ×2 IMPLANT
DRSG PAD ABDOMINAL 8X10 ST (GAUZE/BANDAGES/DRESSINGS) ×2 IMPLANT
DURAPREP 26ML APPLICATOR (WOUND CARE) ×2 IMPLANT
GAUZE XEROFORM 1X8 LF (GAUZE/BANDAGES/DRESSINGS) ×2 IMPLANT
GLOVE BIO SURGEON STRL SZ7.5 (GLOVE) ×2 IMPLANT
GLOVE BIOGEL PI IND STRL 8 (GLOVE) ×1 IMPLANT
GLOVE BIOGEL PI INDICATOR 8 (GLOVE) ×1
GLOVE INDICATOR 6.5 STRL GRN (GLOVE) ×2 IMPLANT
GLOVE SURG SS PI 6.5 STRL IVOR (GLOVE) ×2 IMPLANT
GOWN STRL REIN XL XLG (GOWN DISPOSABLE) ×2 IMPLANT
IV LACTATED RINGER IRRG 3000ML (IV SOLUTION) ×2
IV LR IRRIG 3000ML ARTHROMATIC (IV SOLUTION) ×2 IMPLANT
MANIFOLD NEPTUNE II (INSTRUMENTS) ×2 IMPLANT
PACK ARTHROSCOPY WL (CUSTOM PROCEDURE TRAY) ×2 IMPLANT
PADDING CAST ABS 6INX4YD NS (CAST SUPPLIES) ×1
PADDING CAST ABS COTTON 6X4 NS (CAST SUPPLIES) ×1 IMPLANT
PADDING CAST COTTON 6X4 STRL (CAST SUPPLIES) ×2 IMPLANT
SET ARTHROSCOPY TUBING (MISCELLANEOUS) ×1
SET ARTHROSCOPY TUBING LN (MISCELLANEOUS) ×1 IMPLANT
SPONGE GAUZE 4X4 12PLY (GAUZE/BANDAGES/DRESSINGS) ×2 IMPLANT
SUT ETHILON 4 0 PS 2 18 (SUTURE) ×2 IMPLANT
SYR 20CC LL (SYRINGE) ×2 IMPLANT
TOWEL OR 17X26 10 PK STRL BLUE (TOWEL DISPOSABLE) ×4 IMPLANT
TUBING CONNECTING 10 (TUBING) ×2 IMPLANT
WAND 90 DEG TURBOVAC W/CORD (SURGICAL WAND) IMPLANT
WRAP KNEE MAXI GEL POST OP (GAUZE/BANDAGES/DRESSINGS) ×2 IMPLANT

## 2012-10-21 NOTE — Anesthesia Postprocedure Evaluation (Signed)
  Anesthesia Post-op Note  Patient: Julie [name]  Procedure(s) Performed: Procedure(s) (LRB): ARTHROSCOPY KNEE (Left)  Patient Location: PACU  Anesthesia Type: General  Level of Consciousness: awake and alert   Airway and Oxygen Therapy: Patient Spontanous Breathing  Post-op Pain: mild  Post-op Assessment: Post-op Vital signs reviewed, Patient's Cardiovascular Status Stable, Respiratory Function Stable, Patent Airway and No signs of Nausea or vomiting  Last Vitals:  Filed Vitals:   10/21/12 1730  BP: 126/56  Pulse: 72  Temp: 36.7 C  Resp: 12    Post-op Vital Signs: stable   Complications: No apparent anesthesia complications

## 2012-10-21 NOTE — Anesthesia Preprocedure Evaluation (Addendum)
Anesthesia Evaluation  Patient identified by MRN, date of birth, ID band Patient awake    Reviewed: Allergy & Precautions, H&P , NPO status , Patient's Chart, lab work & pertinent test results  Airway Mallampati: II TM Distance: >3 FB Neck ROM: Full    Dental No notable dental hx.    Pulmonary neg pulmonary ROS,  breath sounds clear to auscultation  Pulmonary exam normal       Cardiovascular Rhythm:Regular Rate:Normal     Neuro/Psych negative neurological ROS  negative psych ROS   GI/Hepatic Neg liver ROS, GERD-  Medicated,  Endo/Other  Hypothyroidism   Renal/GU negative Renal ROS  negative genitourinary   Musculoskeletal negative musculoskeletal ROS (+)   Abdominal   Peds negative pediatric ROS (+)  Hematology negative hematology ROS (+)   Anesthesia Other Findings   Reproductive/Obstetrics negative OB ROS                           Anesthesia Physical Anesthesia Plan  ASA: II  Anesthesia Plan: General   Post-op Pain Management:    Induction: Intravenous  Airway Management Planned: LMA  Additional Equipment:   Intra-op Plan:   Post-operative Plan:   Informed Consent: I have reviewed the patients History and Physical, chart, labs and discussed the procedure including the risks, benefits and alternatives for the proposed anesthesia with the patient or authorized representative who has indicated his/her understanding and acceptance.     Plan Discussed with: CRNA and Surgeon  Anesthesia Plan Comments:         Anesthesia Quick Evaluation

## 2012-10-21 NOTE — H&P (Signed)
Julie [name] is an 71 y.o. female.   Chief Complaint:   Left knee pain with locking/catching HPI:   71 yo female with worsening acute left knee pain.  Has been unable to fully extend her left knee and has locking and catching.  This has greatly effected her mobility and she wishes for a surgical intervention.  Past Medical History  Diagnosis Date  . Orthopnea   . Personal history of colonic polyps   . Contact dermatitis and other eczema, due to unspecified cause   . Unspecified disorder of thyroid   . Arthropathy, unspecified, site unspecified   . Pure hypercholesterolemia   . Unspecified hypothyroidism   . Other bursitis disorders   . Unspecified essential hypertension   . PONV (postoperative nausea and vomiting)     Past Surgical History  Procedure Date  . Tubal ligation   . Breast surgery     breast implants, later removed  . Lumbar laminectomy   . Back surgery 2002    Family History  Problem Relation Age of Onset  . Cancer Mother     breast and lung  . Stroke Father   . Heart attack Father   . Diabetes Sister   . Emphysema Maternal Aunt   . Colon cancer Neg Hx    Social History:  reports that she has never smoked. She has never used smokeless tobacco. She reports that she does not drink alcohol or use illicit drugs.  Allergies:  Allergies  Allergen Reactions  . Penicillins Anaphylaxis    REACTION: causes tongue to swell  . Codeine Nausea Only  . Lisinopril     REACTION: wheezing    Medications Prior to Admission  Medication Sig Dispense Refill  . meloxicam (MOBIC) 15 MG tablet Take 15 mg by mouth daily.      Marland Kitchen omeprazole (PRILOSEC) 40 MG capsule take 1 capsule by mouth once daily  90 capsule  1  . thyroid (ARMOUR) 30 MG tablet Take 30 mg by mouth daily before breakfast.      . traMADol (ULTRAM) 50 MG tablet Take 50 mg by mouth every 8 (eight) hours as needed. Pain      . zolpidem (AMBIEN) 10 MG tablet Take 10 mg by mouth at bedtime as needed. Sleep       . gabapentin (NEURONTIN) 300 MG capsule Take 300 mg by mouth 3 (three) times daily as needed. Pain        No results found for this or any previous visit (from the past 48 hour(s)). No results found.  Review of Systems  All other systems reviewed and are negative.    Blood pressure 149/73, pulse 75, temperature 98.1 F (36.7 C), resp. rate 20, SpO2 96.00%. Physical Exam  Constitutional: She is oriented to person, place, and time. She appears well-developed and well-nourished.  HENT:  Head: Normocephalic and atraumatic.  Eyes: Conjunctivae normal are normal. Pupils are equal, round, and reactive to light.  Neck: Normal range of motion. Neck supple.  Cardiovascular: Normal rate and regular rhythm.   Respiratory: Effort normal and breath sounds normal.  GI: Soft. Bowel sounds are normal.  Musculoskeletal:       Left knee: She exhibits decreased range of motion and effusion. tenderness found. Medial joint line and lateral joint line tenderness noted.  Neurological: She is alert and oriented to person, place, and time.  Skin: Skin is warm and dry.  Psychiatric: She has a normal mood and affect.     Assessment/Plan  Left knee with symptomatic meniscal tear on top of deg joint disease 1) to the OR for an outpatient left knee arthroscopy  Julie [name] Y 10/21/2012, 2:21 PM

## 2012-10-21 NOTE — Transfer of Care (Signed)
Immediate Anesthesia Transfer of Care Note  Patient: Julie [name]  Procedure(s) Performed: Procedure(s) (LRB) with comments: ARTHROSCOPY KNEE (Left) - Left Knee Arthroscopy, Debridement, partial synovectomy  Patient Location: PACU  Anesthesia Type:General  Level of Consciousness: sedated, patient cooperative and responds to stimulation  Airway & Oxygen Therapy: Patient Spontanous Breathing and Patient connected to face mask oxygen  Post-op Assessment: Report given to PACU RN and Post -op Vital signs reviewed and stable  Post vital signs: Reviewed and stable  Complications: No apparent anesthesia complications

## 2012-10-21 NOTE — Preoperative (Signed)
Beta Blockers   Reason not to administer Beta Blockers:Not Applicable 

## 2012-10-21 NOTE — Progress Notes (Signed)
Ambulated patient to bathroom. Tolerated well. Voided x2 post op. Patient and family both able to teach back d/c instruction information. Home with husband.

## 2012-10-21 NOTE — Brief Op Note (Signed)
10/21/2012  5:29 PM  PATIENT:  Julie [name]  71 y.o. female  PRE-OPERATIVE DIAGNOSIS:  Left knee lateral meniscal tear  POST-OPERATIVE DIAGNOSIS:  Left knee lateral meniscal tear  PROCEDURE:  Procedure(s) (LRB) with comments: ARTHROSCOPY KNEE (Left) - Left Knee Arthroscopy, Debridement, partial synovectomy  SURGEON:  Surgeon(s) and Role:    * Kathryne Hitch, MD - Primary  PHYSICIAN ASSISTANT:   ASSISTANTS: none   ANESTHESIA:   local and general  EBL:  Total I/O In: 750 [I.V.:750] Out: 5 [Blood:5]  BLOOD ADMINISTERED:none  DRAINS: none   LOCAL MEDICATIONS USED:  MARCAINE     SPECIMEN:  No Specimen  DISPOSITION OF SPECIMEN:  N/A  COUNTS:  YES  TOURNIQUET:  * No tourniquets in log *  DICTATION: .Other Dictation: Dictation Number [id number]  PLAN OF CARE: Discharge to home after PACU  PATIENT DISPOSITION:  PACU - hemodynamically stable.   Delay start of Pharmacological VTE agent (>24hrs) due to surgical blood loss or risk of bleeding: not applicable

## 2012-10-22 NOTE — Op Note (Signed)
NAMESURYA, FOLDEN NO.:  [id number]  MEDICAL RECORD NO.:  [id number]  LOCATION:  [facility]                         FACILITY:  [facility]  PHYSICIAN:  Vanita Panda. Magnus Ivan, M.D.DATE OF BIRTH:  01-13-1941  DATE OF PROCEDURE:  10/21/2012 DATE OF DISCHARGE:  10/21/2012                              OPERATIVE REPORT   PREOPERATIVE DIAGNOSIS:  Left knee acute pain with likely meniscal tear and synovitis.  POSTOPERATIVE DIAGNOSES:  Left knee tricompartmental arthritis with full- thickness cartilage loss and pigmented villonodular synovitis.  PROCEDURE:  Left knee arthroscopy with extensive debridement and partial synovectomy.  SURGEON:  Vanita Panda. Magnus Ivan, M.D.  ANESTHESIA: 1. General. 2. Local in the knee with 0.25% plain Sensorcaine mixed with 4 mg of     morphine.  BLOOD LOSS:  Minimal.  COMPLICATIONS:  None.  INDICATIONS:  [name] is a 71 year old female with acute left knee pain, which she said she had not been having any problems with her knee at all.  She developed locking and catching in her knee with swelling. We recommended steroid injection, but this did not work for her.  I saw x-rays of her knee that showed significant lateral compartment arthritic changes, but due to the acute onset of her pain and swelling and the possibility of acute meniscal tear, she wished to proceed with arthroscopic intervention.  I talked to her even about the possibility of a total knee replacement, but she wanted to see if we could just temporize her knee with the arthroscopic procedure.  PROCEDURE DESCRIPTION:  After informed consent was obtained and appropriate left knee was marked, she was brought to the operating room, placed supine on the operating table where general anesthesia was then obtained.  Her left leg was prepped and draped from the thigh down to the ankle with DuraPrep and sterile drapes including sterile stockinette with the bed raised, a lateral  leg post was utilized, and the left knee was flexed off at the side of the table.  A time-out was called and she was identified as correct patient and correct left knee.  I then made an anterolateral arthroscopy portal, inserted a cannula in the knee and drained a large effusion from the knee.  I then placed a camera within the knee and went directly to the medial compartment and made an anterior medial incision.  There was definitely a large brown pigmented material throughout the knee and they could have pigmented villonodular synovitis.  Using arthroscopic shaver, I performed a partial synovectomy debriding in all three compartments of the knee.  I found areas of full- thickness cartilage loss on the medial and lateral sides with degenerative meniscal tearing.  The ACL was intact.  Once I thoroughly cleaned out the knee, I then allowed fluid to lavage the knee with several mL of fluid going through the knee.  I then drained all the fluid from the knee, closed the portal sites with interrupted nylon suture, and inserted a mixture of morphine and Marcaine into her knee. Xeroform followed by well-padded sterile dressing was applied, and she was awakened, extubated and taken to the recovery room in stable condition.  All final counts were correct. There were no complications  noted.  Postoperatively, I am going to have her to weightbear as tolerated with slowly increasing her activities and follow up in the office in a week.     Vanita Panda. Magnus Ivan, M.D.     CYB/MEDQ  D:  10/21/2012  T:  10/22/2012  Job:  [id number]

## 2012-10-24 ENCOUNTER — Encounter (HOSPITAL_COMMUNITY): Payer: Self-pay | Admitting: Orthopaedic Surgery

## 2012-10-24 ENCOUNTER — Other Ambulatory Visit: Payer: Self-pay | Admitting: Internal Medicine

## 2012-11-29 ENCOUNTER — Other Ambulatory Visit: Payer: Self-pay | Admitting: Anesthesiology

## 2012-11-29 DIAGNOSIS — M899 Disorder of bone, unspecified: Secondary | ICD-10-CM

## 2012-12-09 ENCOUNTER — Other Ambulatory Visit (HOSPITAL_COMMUNITY): Payer: Self-pay | Admitting: Orthopaedic Surgery

## 2012-12-17 ENCOUNTER — Other Ambulatory Visit: Payer: Self-pay

## 2012-12-18 ENCOUNTER — Other Ambulatory Visit: Payer: Self-pay | Admitting: Internal Medicine

## 2012-12-20 ENCOUNTER — Encounter (HOSPITAL_COMMUNITY): Payer: Self-pay | Admitting: Pharmacy Technician

## 2012-12-23 NOTE — Patient Instructions (Signed)
Julie [name]  12/23/2012   Your procedure is scheduled on: 12/30/12    Report to [facility] at12noon  Call this number if you have problems the morning of surgery: [phone]   Remember:   Do not eat food or drink liquids after midnight.   Take these medicines the morning of surgery with A SIP OF WATER:    Do not wear jewelry, make-up or nail polish.  Do not wear lotions, powders, or perfumes.   Do not shave 48 hours prior to surgery.   Do not bring valuables to the hospital.  Contacts, dentures or bridgework may not be worn into surgery.  Leave suitcase in the car. After surgery it may be brought to your room.  For patients admitted to the hospital, checkout time is 11:00 AM the day of  discharge.     SEE CHG INSTRUCTION SHEET    Please read over the following fact sheets that you were given: MRSA Information, coughing and deep breathing exercises, leg exercises, Blood Transfusion FAct Sheet                Failure to comply with these instructions may result in cancellation of your surgery.                Patient Signature ____________________________              Nurse Signature _____________________________

## 2012-12-26 ENCOUNTER — Encounter (HOSPITAL_COMMUNITY): Payer: Self-pay

## 2012-12-26 ENCOUNTER — Encounter (HOSPITAL_COMMUNITY)
Admission: RE | Admit: 2012-12-26 | Discharge: 2012-12-26 | Disposition: A | Discharge status: Home / Self Care | Payer: Medicare Other | Source: Ambulatory Visit | Attending: Orthopaedic Surgery | Admitting: Orthopaedic Surgery

## 2012-12-26 HISTORY — DX: Myoneural disorder, unspecified: G70.9

## 2012-12-26 HISTORY — DX: Gastro-esophageal reflux disease without esophagitis: K21.9

## 2012-12-26 HISTORY — DX: Unspecified osteoarthritis, unspecified site: M19.90

## 2012-12-26 LAB — APTT: aPTT: 28 seconds (ref 24–37)

## 2012-12-26 LAB — URINALYSIS, ROUTINE W REFLEX MICROSCOPIC
Nitrite: NEGATIVE
Specific Gravity, Urine: 1.026 (ref 1.005–1.030)
pH: 5.5 (ref 5.0–8.0)

## 2012-12-26 LAB — BASIC METABOLIC PANEL
CO2: 27 mEq/L (ref 19–32)
Calcium: 9.5 mg/dL (ref 8.4–10.5)
Sodium: 140 mEq/L (ref 135–145)

## 2012-12-26 LAB — CBC
MCV: 89 fL (ref 78.0–100.0)
Platelets: 301 10*3/uL (ref 150–400)
RBC: 4.29 MIL/uL (ref 3.87–5.11)
WBC: 18.8 10*3/uL — ABNORMAL HIGH (ref 4.0–10.5)

## 2012-12-26 LAB — PROTIME-INR
INR: 0.94 (ref 0.00–1.49)
Prothrombin Time: 12.5 seconds (ref 11.6–15.2)

## 2012-12-26 LAB — URINE MICROSCOPIC-ADD ON

## 2012-12-26 NOTE — Progress Notes (Signed)
CBC results faxed via EPIC to Dr Maureen Ralphs.

## 2012-12-26 NOTE — Progress Notes (Signed)
Urinalysis with micro results faxed via EPIC to Dr Doneen Poisson.

## 2012-12-27 LAB — URINE CULTURE
Colony Count: NO GROWTH
Culture: NO GROWTH

## 2012-12-30 ENCOUNTER — Encounter (HOSPITAL_COMMUNITY): Payer: Self-pay | Admitting: Anesthesiology

## 2012-12-30 ENCOUNTER — Inpatient Hospital Stay (HOSPITAL_COMMUNITY): Payer: Medicare Other

## 2012-12-30 ENCOUNTER — Encounter (HOSPITAL_COMMUNITY): Payer: Self-pay | Admitting: *Deleted

## 2012-12-30 ENCOUNTER — Inpatient Hospital Stay (HOSPITAL_COMMUNITY)
Admission: RE | Admit: 2012-12-30 | Discharge: 2013-01-02 | DRG: 470 | Disposition: A | Discharge status: Skilled Nursing Facility | Payer: Medicare Other | Source: Ambulatory Visit | Attending: Orthopaedic Surgery | Admitting: Orthopaedic Surgery

## 2012-12-30 ENCOUNTER — Inpatient Hospital Stay (HOSPITAL_COMMUNITY): Payer: Medicare Other | Admitting: Anesthesiology

## 2012-12-30 ENCOUNTER — Encounter (HOSPITAL_COMMUNITY)
Admission: RE | Disposition: A | Discharge status: Skilled Nursing Facility | Payer: Self-pay | Source: Ambulatory Visit | Attending: Orthopaedic Surgery

## 2012-12-30 DIAGNOSIS — I10 Essential (primary) hypertension: Secondary | ICD-10-CM | POA: Diagnosis present

## 2012-12-30 DIAGNOSIS — M12269 Villonodular synovitis (pigmented), unspecified knee: Secondary | ICD-10-CM | POA: Diagnosis present

## 2012-12-30 DIAGNOSIS — K219 Gastro-esophageal reflux disease without esophagitis: Secondary | ICD-10-CM | POA: Diagnosis present

## 2012-12-30 DIAGNOSIS — Z01812 Encounter for preprocedural laboratory examination: Secondary | ICD-10-CM

## 2012-12-30 DIAGNOSIS — Z79899 Other long term (current) drug therapy: Secondary | ICD-10-CM

## 2012-12-30 DIAGNOSIS — E039 Hypothyroidism, unspecified: Secondary | ICD-10-CM | POA: Diagnosis present

## 2012-12-30 DIAGNOSIS — E7800 Pure hypercholesterolemia, unspecified: Secondary | ICD-10-CM | POA: Diagnosis present

## 2012-12-30 DIAGNOSIS — J45909 Unspecified asthma, uncomplicated: Secondary | ICD-10-CM | POA: Diagnosis present

## 2012-12-30 DIAGNOSIS — E119 Type 2 diabetes mellitus without complications: Secondary | ICD-10-CM | POA: Diagnosis present

## 2012-12-30 DIAGNOSIS — M12262 Villonodular synovitis (pigmented), left knee: Secondary | ICD-10-CM

## 2012-12-30 DIAGNOSIS — M1710 Unilateral primary osteoarthritis, unspecified knee: Principal | ICD-10-CM | POA: Diagnosis present

## 2012-12-30 HISTORY — PX: TOTAL KNEE ARTHROPLASTY: SHX125

## 2012-12-30 LAB — ABO/RH: ABO/RH(D): A NEG

## 2012-12-30 LAB — TYPE AND SCREEN
ABO/RH(D): A NEG
Antibody Screen: NEGATIVE

## 2012-12-30 SURGERY — ARTHROPLASTY, KNEE, TOTAL
Anesthesia: General | Site: Knee | Laterality: Left | Wound class: Clean

## 2012-12-30 MED ORDER — RIVAROXABAN 10 MG PO TABS
10.0000 mg | ORAL_TABLET | Freq: Every day | ORAL | Status: DC
  Administered 2012-12-31 – 2013-01-02 (×3): 10 mg via ORAL
  Filled 2012-12-30 (×4): qty 1

## 2012-12-30 MED ORDER — KETOROLAC TROMETHAMINE 15 MG/ML IJ SOLN
7.5000 mg | Freq: Four times a day (QID) | INTRAMUSCULAR | Status: AC
  Administered 2012-12-30 – 2012-12-31 (×3): 7.5 mg via INTRAVENOUS
  Filled 2012-12-30 (×4): qty 1

## 2012-12-30 MED ORDER — PROPOFOL 10 MG/ML IV BOLUS
INTRAVENOUS | Status: DC | PRN
  Administered 2012-12-30: 80 mg via INTRAVENOUS

## 2012-12-30 MED ORDER — KETOROLAC TROMETHAMINE 15 MG/ML IJ SOLN: 7.5000 mg | Freq: Four times a day (QID) | INTRAMUSCULAR | Status: DC

## 2012-12-30 MED ORDER — ALUM & MAG HYDROXIDE-SIMETH 200-200-20 MG/5ML PO SUSP: 30.0000 mL | ORAL | Status: DC | PRN

## 2012-12-30 MED ORDER — MENTHOL 3 MG MT LOZG: 1.0000 | LOZENGE | OROMUCOSAL | Status: DC | PRN

## 2012-12-30 MED ORDER — CLINDAMYCIN PHOSPHATE 600 MG/50ML IV SOLN
600.0000 mg | Freq: Four times a day (QID) | INTRAVENOUS | Status: AC
  Administered 2012-12-30 – 2012-12-31 (×2): 600 mg via INTRAVENOUS
  Filled 2012-12-30 (×2): qty 50

## 2012-12-30 MED ORDER — ONDANSETRON HCL 4 MG PO TABS: 4.0000 mg | ORAL_TABLET | Freq: Four times a day (QID) | ORAL | Status: DC | PRN

## 2012-12-30 MED ORDER — ACETAMINOPHEN 10 MG/ML IV SOLN
INTRAVENOUS | Status: DC | PRN
  Administered 2012-12-30: 1000 mg via INTRAVENOUS

## 2012-12-30 MED ORDER — DOCUSATE SODIUM 100 MG PO CAPS
100.0000 mg | ORAL_CAPSULE | Freq: Two times a day (BID) | ORAL | Status: DC
  Administered 2012-12-30 – 2013-01-02 (×5): 100 mg via ORAL

## 2012-12-30 MED ORDER — SODIUM CHLORIDE 0.9 % IR SOLN
Status: DC | PRN
  Administered 2012-12-30: 1000 mL

## 2012-12-30 MED ORDER — PHENOL 1.4 % MT LIQD: 1.0000 | OROMUCOSAL | Status: DC | PRN

## 2012-12-30 MED ORDER — CIPROFLOXACIN IN D5W 400 MG/200ML IV SOLN
400.0000 mg | Freq: Two times a day (BID) | INTRAVENOUS | Status: DC
  Administered 2012-12-30: 400 mg via INTRAVENOUS
  Filled 2012-12-30 (×3): qty 200

## 2012-12-30 MED ORDER — CIPROFLOXACIN HCL 500 MG PO TABS
500.0000 mg | ORAL_TABLET | Freq: Two times a day (BID) | ORAL | Status: DC
  Administered 2012-12-31 – 2013-01-02 (×5): 500 mg via ORAL
  Filled 2012-12-30 (×7): qty 1

## 2012-12-30 MED ORDER — PROPOFOL INFUSION 10 MG/ML OPTIME
INTRAVENOUS | Status: DC | PRN
  Administered 2012-12-30: 140 ug/kg/min via INTRAVENOUS

## 2012-12-30 MED ORDER — ACETAMINOPHEN 325 MG PO TABS: 650.0000 mg | ORAL_TABLET | Freq: Four times a day (QID) | ORAL | Status: DC | PRN

## 2012-12-30 MED ORDER — BUPIVACAINE IN DEXTROSE 0.75-8.25 % IT SOLN
INTRATHECAL | Status: DC | PRN
  Administered 2012-12-30: 15 mg via INTRATHECAL

## 2012-12-30 MED ORDER — TRAMADOL HCL 50 MG PO TABS
100.0000 mg | ORAL_TABLET | Freq: Four times a day (QID) | ORAL | Status: DC
  Administered 2012-12-30 – 2013-01-02 (×9): 100 mg via ORAL
  Filled 2012-12-30 (×4): qty 2
  Filled 2012-12-30: qty 1
  Filled 2012-12-30 (×12): qty 2

## 2012-12-30 MED ORDER — METHOCARBAMOL 100 MG/ML IJ SOLN
500.0000 mg | Freq: Four times a day (QID) | INTRAVENOUS | Status: DC | PRN
  Filled 2012-12-30: qty 5

## 2012-12-30 MED ORDER — PANTOPRAZOLE SODIUM 40 MG PO TBEC
80.0000 mg | DELAYED_RELEASE_TABLET | Freq: Every day | ORAL | Status: DC
  Administered 2012-12-31 – 2013-01-02 (×3): 80 mg via ORAL
  Filled 2012-12-30 (×3): qty 2

## 2012-12-30 MED ORDER — HYDROMORPHONE HCL PF 1 MG/ML IJ SOLN
0.2500 mg | INTRAMUSCULAR | Status: DC | PRN
  Administered 2012-12-30 (×4): 0.5 mg via INTRAVENOUS

## 2012-12-30 MED ORDER — SODIUM CHLORIDE 0.9 % IV SOLN
INTRAVENOUS | Status: DC
  Administered 2012-12-30 – 2012-12-31 (×2): via INTRAVENOUS

## 2012-12-30 MED ORDER — ZOLPIDEM TARTRATE 5 MG PO TABS: 5.0000 mg | ORAL_TABLET | Freq: Every evening | ORAL | Status: DC | PRN

## 2012-12-30 MED ORDER — METHOCARBAMOL 500 MG PO TABS
500.0000 mg | ORAL_TABLET | Freq: Four times a day (QID) | ORAL | Status: DC | PRN
  Administered 2012-12-30 – 2013-01-02 (×5): 500 mg via ORAL
  Filled 2012-12-30 (×5): qty 1

## 2012-12-30 MED ORDER — THYROID 30 MG PO TABS
30.0000 mg | ORAL_TABLET | Freq: Every day | ORAL | Status: DC
  Administered 2012-12-31 – 2013-01-02 (×3): 30 mg via ORAL
  Filled 2012-12-30 (×4): qty 1

## 2012-12-30 MED ORDER — FENTANYL CITRATE 0.05 MG/ML IJ SOLN: 25.0000 ug | INTRAMUSCULAR | Status: DC | PRN

## 2012-12-30 MED ORDER — KETOROLAC TROMETHAMINE 30 MG/ML IJ SOLN
30.0000 mg | Freq: Once | INTRAMUSCULAR | Status: AC
  Administered 2012-12-30: 30 mg via INTRAVENOUS

## 2012-12-30 MED ORDER — METOCLOPRAMIDE HCL 10 MG PO TABS: 5.0000 mg | ORAL_TABLET | Freq: Three times a day (TID) | ORAL | Status: DC | PRN

## 2012-12-30 MED ORDER — FENTANYL CITRATE 0.05 MG/ML IJ SOLN
INTRAMUSCULAR | Status: DC | PRN
  Administered 2012-12-30 (×2): 25 ug via INTRAVENOUS
  Administered 2012-12-30: 50 ug via INTRAVENOUS
  Administered 2012-12-30: 25 ug via INTRAVENOUS
  Administered 2012-12-30: 50 ug via INTRAVENOUS
  Administered 2012-12-30 (×7): 25 ug via INTRAVENOUS

## 2012-12-30 MED ORDER — LACTATED RINGERS IV SOLN
INTRAVENOUS | Status: DC
  Administered 2012-12-30 (×2): via INTRAVENOUS
  Administered 2012-12-30: 1000 mL via INTRAVENOUS

## 2012-12-30 MED ORDER — DEXAMETHASONE SODIUM PHOSPHATE 10 MG/ML IJ SOLN
INTRAMUSCULAR | Status: DC | PRN
  Administered 2012-12-30: 10 mg via INTRAVENOUS

## 2012-12-30 MED ORDER — ONDANSETRON HCL 4 MG/2ML IJ SOLN
4.0000 mg | Freq: Four times a day (QID) | INTRAMUSCULAR | Status: DC | PRN
  Administered 2013-01-01: 4 mg via INTRAVENOUS
  Filled 2012-12-30: qty 2

## 2012-12-30 MED ORDER — OXYCODONE HCL ER 10 MG PO T12A
10.0000 mg | EXTENDED_RELEASE_TABLET | Freq: Two times a day (BID) | ORAL | Status: DC
  Administered 2012-12-30 – 2013-01-02 (×6): 10 mg via ORAL
  Filled 2012-12-30 (×6): qty 1

## 2012-12-30 MED ORDER — ONDANSETRON HCL 4 MG/2ML IJ SOLN
INTRAMUSCULAR | Status: DC | PRN
  Administered 2012-12-30: 4 mg via INTRAVENOUS

## 2012-12-30 MED ORDER — METOCLOPRAMIDE HCL 5 MG/ML IJ SOLN: 5.0000 mg | Freq: Three times a day (TID) | INTRAMUSCULAR | Status: DC | PRN

## 2012-12-30 MED ORDER — OXYCODONE HCL 5 MG PO TABS
5.0000 mg | ORAL_TABLET | ORAL | Status: DC | PRN
  Administered 2012-12-30: 5 mg via ORAL
  Administered 2012-12-31 – 2013-01-01 (×5): 10 mg via ORAL
  Filled 2012-12-30 (×2): qty 2
  Filled 2012-12-30: qty 1
  Filled 2012-12-30 (×4): qty 2

## 2012-12-30 MED ORDER — MEPERIDINE HCL 50 MG/ML IJ SOLN: 6.2500 mg | INTRAMUSCULAR | Status: DC | PRN

## 2012-12-30 MED ORDER — 0.9 % SODIUM CHLORIDE (POUR BTL) OPTIME
TOPICAL | Status: DC | PRN
  Administered 2012-12-30: 1000 mL

## 2012-12-30 MED ORDER — MIDAZOLAM HCL 2 MG/2ML IJ SOLN
0.5000 mg | Freq: Once | INTRAMUSCULAR | Status: AC | PRN
  Administered 2012-12-30 (×2): 1 mg via INTRAVENOUS

## 2012-12-30 MED ORDER — ACETAMINOPHEN 650 MG RE SUPP: 650.0000 mg | Freq: Four times a day (QID) | RECTAL | Status: DC | PRN

## 2012-12-30 MED ORDER — PROMETHAZINE HCL 25 MG/ML IJ SOLN
6.2500 mg | INTRAMUSCULAR | Status: DC | PRN
  Administered 2012-12-30: 12.5 mg via INTRAVENOUS

## 2012-12-30 MED ORDER — CLINDAMYCIN PHOSPHATE 900 MG/50ML IV SOLN
900.0000 mg | INTRAVENOUS | Status: AC
  Administered 2012-12-30: 900 mg via INTRAVENOUS

## 2012-12-30 MED ORDER — CHLORHEXIDINE GLUCONATE 4 % EX LIQD
60.0000 mL | Freq: Once | CUTANEOUS | Status: DC
  Filled 2012-12-30: qty 60

## 2012-12-30 MED ORDER — ACETAMINOPHEN 10 MG/ML IV SOLN: 1000.0000 mg | Freq: Once | INTRAVENOUS | Status: DC | PRN

## 2012-12-30 MED ORDER — HYDROMORPHONE HCL PF 1 MG/ML IJ SOLN
0.5000 mg | INTRAMUSCULAR | Status: DC | PRN
  Administered 2012-12-30: 21:00:00 via INTRAVENOUS
  Filled 2012-12-30: qty 1

## 2012-12-30 MED ORDER — DIPHENHYDRAMINE HCL 12.5 MG/5ML PO ELIX: 12.5000 mg | ORAL_SOLUTION | ORAL | Status: DC | PRN

## 2012-12-30 SURGICAL SUPPLY — 55 items
BAG ZIPLOCK 12X15 (MISCELLANEOUS) ×2 IMPLANT
BANDAGE ELASTIC 6 VELCRO ST LF (GAUZE/BANDAGES/DRESSINGS) ×2 IMPLANT
BANDAGE ESMARK 6X9 LF (GAUZE/BANDAGES/DRESSINGS) ×1 IMPLANT
BLADE SAG 18X100X1.27 (BLADE) ×2 IMPLANT
BLADE SAW SGTL 13.0X1.19X90.0M (BLADE) ×2 IMPLANT
BNDG ESMARK 6X9 LF (GAUZE/BANDAGES/DRESSINGS) ×2
BOWL SMART MIX CTS (DISPOSABLE) ×2 IMPLANT
CEMENT HV SMART SET (Cement) ×4 IMPLANT
CLOTH BEACON ORANGE TIMEOUT ST (SAFETY) ×2 IMPLANT
CUFF TOURN SGL QUICK 34 (TOURNIQUET CUFF) ×1
CUFF TRNQT CYL 34X4X40X1 (TOURNIQUET CUFF) ×1 IMPLANT
DRAPE EXTREMITY T 121X128X90 (DRAPE) ×2 IMPLANT
DRAPE LG THREE QUARTER DISP (DRAPES) ×2 IMPLANT
DRAPE POUCH INSTRU U-SHP 10X18 (DRAPES) ×2 IMPLANT
DRAPE U-SHAPE 47X51 STRL (DRAPES) ×2 IMPLANT
DRSG PAD ABDOMINAL 8X10 ST (GAUZE/BANDAGES/DRESSINGS) ×2 IMPLANT
DURAPREP 26ML APPLICATOR (WOUND CARE) ×2 IMPLANT
ELECT REM PT RETURN 9FT ADLT (ELECTROSURGICAL) ×2
ELECTRODE REM PT RTRN 9FT ADLT (ELECTROSURGICAL) ×1 IMPLANT
EVACUATOR 1/8 PVC DRAIN (DRAIN) ×2 IMPLANT
FACESHIELD LNG OPTICON STERILE (SAFETY) ×10 IMPLANT
GAUZE XEROFORM 5X9 LF (GAUZE/BANDAGES/DRESSINGS) ×2 IMPLANT
GLOVE BIO SURGEON STRL SZ7.5 (GLOVE) ×2 IMPLANT
GLOVE BIOGEL PI IND STRL 7.5 (GLOVE) ×1 IMPLANT
GLOVE BIOGEL PI IND STRL 8 (GLOVE) ×1 IMPLANT
GLOVE BIOGEL PI INDICATOR 7.5 (GLOVE) ×1
GLOVE BIOGEL PI INDICATOR 8 (GLOVE) ×1
GLOVE ECLIPSE 7.0 STRL STRAW (GLOVE) ×2 IMPLANT
GOWN STRL REIN XL XLG (GOWN DISPOSABLE) ×4 IMPLANT
HANDPIECE INTERPULSE COAX TIP (DISPOSABLE) ×1
IMMOBILIZER KNEE 20 (SOFTGOODS) ×2
IMMOBILIZER KNEE 20 THIGH 36 (SOFTGOODS) ×1 IMPLANT
KIT BASIN OR (CUSTOM PROCEDURE TRAY) ×2 IMPLANT
NS IRRIG 1000ML POUR BTL (IV SOLUTION) ×2 IMPLANT
PACK TOTAL JOINT (CUSTOM PROCEDURE TRAY) ×2 IMPLANT
PADDING CAST COTTON 6X4 STRL (CAST SUPPLIES) ×2 IMPLANT
POSITIONER SURGICAL ARM (MISCELLANEOUS) ×2 IMPLANT
SET HNDPC FAN SPRY TIP SCT (DISPOSABLE) ×1 IMPLANT
SET PAD KNEE POSITIONER (MISCELLANEOUS) ×2 IMPLANT
SPONGE GAUZE 4X4 12PLY (GAUZE/BANDAGES/DRESSINGS) ×2 IMPLANT
SPONGE LAP 18X18 X RAY DECT (DISPOSABLE) ×2 IMPLANT
STAPLER VISISTAT 35W (STAPLE) ×2 IMPLANT
SUCTION FRAZIER 12FR DISP (SUCTIONS) ×2 IMPLANT
SUT VIC AB 0 CT1 27 (SUTURE)
SUT VIC AB 0 CT1 27XBRD ANTBC (SUTURE) IMPLANT
SUT VIC AB 1 CT1 27 (SUTURE) ×3
SUT VIC AB 1 CT1 27XBRD ANTBC (SUTURE) ×3 IMPLANT
SUT VIC AB 2-0 CT1 27 (SUTURE) ×2
SUT VIC AB 2-0 CT1 TAPERPNT 27 (SUTURE) ×2 IMPLANT
SUT VLOC 180 0 24IN GS25 (SUTURE) ×2 IMPLANT
TOWEL OR 17X26 10 PK STRL BLUE (TOWEL DISPOSABLE) ×4 IMPLANT
TOWEL OR NON WOVEN STRL DISP B (DISPOSABLE) ×2 IMPLANT
TRAY FOLEY CATH 14FRSI W/METER (CATHETERS) ×2 IMPLANT
WATER STERILE IRR 1500ML POUR (IV SOLUTION) ×4 IMPLANT
WRAP KNEE MAXI GEL POST OP (GAUZE/BANDAGES/DRESSINGS) ×2 IMPLANT

## 2012-12-30 NOTE — Brief Op Note (Signed)
12/30/2012  5:20 PM  PATIENT:  Julie [name]  72 y.o. female  PRE-OPERATIVE DIAGNOSIS:  Left knee severe osteoarthritis  POST-OPERATIVE DIAGNOSIS:  Left knee severe osteoarthritis  PROCEDURE:  Procedure(s) with comments: TOTAL KNEE ARTHROPLASTY (Left) - Left Total Knee Arthroplasty  SURGEON:  Surgeon(s) and Role:    * Kathryne Hitch, MD - Primary  PHYSICIAN ASSISTANT:   ASSISTANTS: Maud Deed, PA-C   ANESTHESIA:   spinal and general  EBL:  Total I/O In: 1000 [I.V.:1000] Out: 325 [Urine:275; Blood:50]  BLOOD ADMINISTERED:none  DRAINS: (medium) Hemovact drain(s) in the knee with  Suction Open   LOCAL MEDICATIONS USED:  NONE  SPECIMEN:  No Specimen  DISPOSITION OF SPECIMEN:  N/A  COUNTS:  YES  TOURNIQUET:   Total Tourniquet Time Documented: Thigh (Left) - 54 minutes Total: Thigh (Left) - 54 minutes   DICTATION: .Other Dictation: Dictation Number [id number]  PLAN OF CARE: Admit to inpatient   PATIENT DISPOSITION:  PACU - hemodynamically stable.   Delay start of Pharmacological VTE agent (>24hrs) due to surgical blood loss or risk of bleeding: no

## 2012-12-30 NOTE — Anesthesia Preprocedure Evaluation (Addendum)
Anesthesia Evaluation  Patient identified by MRN, date of birth, ID band Patient awake    Reviewed: Allergy & Precautions, H&P , NPO status , Patient's Chart, lab work & pertinent test results  History of Anesthesia Complications (+) PONV  Airway Mallampati: II TM Distance: >3 FB Neck ROM: Full    Dental no notable dental hx. (+) Dental Advisory Given   Pulmonary neg pulmonary ROS, shortness of breath, asthma ,  breath sounds clear to auscultation  Pulmonary exam normal       Cardiovascular hypertension, Pt. on medications + Orthopnea Rhythm:Regular Rate:Normal     Neuro/Psych negative neurological ROS  negative psych ROS   GI/Hepatic Neg liver ROS, GERD-  Medicated,  Endo/Other  diabetes, Type 2Hypothyroidism   Renal/GU negative Renal ROS  negative genitourinary   Musculoskeletal negative musculoskeletal ROS (+)   Abdominal   Peds negative pediatric ROS (+)  Hematology negative hematology ROS (+)   Anesthesia Other Findings   Reproductive/Obstetrics negative OB ROS                          Anesthesia Physical  Anesthesia Plan  ASA: II  Anesthesia Plan: Spinal   Post-op Pain Management:    Induction:   Airway Management Planned:   Additional Equipment:   Intra-op Plan:   Post-operative Plan:   Informed Consent: I have reviewed the patients History and Physical, chart, labs and discussed the procedure including the risks, benefits and alternatives for the proposed anesthesia with the patient or authorized representative who has indicated his/her understanding and acceptance.   Dental advisory given  Plan Discussed with: CRNA  Anesthesia Plan Comments:       Anesthesia Quick Evaluation                                  Anesthesia Evaluation  Patient identified by MRN, date of birth, ID band Patient awake    Reviewed: Allergy & Precautions, H&P , NPO status ,  Patient's Chart, lab work & pertinent test results  Airway Mallampati: II TM Distance: >3 FB Neck ROM: Full    Dental No notable dental hx.    Pulmonary neg pulmonary ROS,  breath sounds clear to auscultation  Pulmonary exam normal       Cardiovascular Rhythm:Regular Rate:Normal     Neuro/Psych negative neurological ROS  negative psych ROS   GI/Hepatic Neg liver ROS, GERD-  Medicated,  Endo/Other  Hypothyroidism   Renal/GU negative Renal ROS  negative genitourinary   Musculoskeletal negative musculoskeletal ROS (+)   Abdominal   Peds negative pediatric ROS (+)  Hematology negative hematology ROS (+)   Anesthesia Other Findings   Reproductive/Obstetrics negative OB ROS                           Anesthesia Physical Anesthesia Plan  ASA: II  Anesthesia Plan: General   Post-op Pain Management:    Induction: Intravenous  Airway Management Planned: LMA  Additional Equipment:   Intra-op Plan:   Post-operative Plan:   Informed Consent: I have reviewed the patients History and Physical, chart, labs and discussed the procedure including the risks, benefits and alternatives for the proposed anesthesia with the patient or authorized representative who has indicated his/her understanding and acceptance.     Plan Discussed with: CRNA and Surgeon  Anesthesia Plan Comments:  Anesthesia Quick Evaluation

## 2012-12-30 NOTE — H&P (Signed)
TOTAL KNEE ADMISSION H&P  Patient is being admitted for left total knee arthroplasty.  Subjective:  Chief Complaint:left knee pain.  HPI: Julie [name], 72 y.o. female, has a history of pain and functional disability in the left knee due to arthritis and pigmented vilonodular synovitis and has failed non-surgical conservative treatments for greater than 12 weeks to includeNSAID's and/or analgesics, corticosteriod injections, activity modification and arthroscopy with synovectomy.  Onset of symptoms was gradual, starting 2 years ago with rapidlly worsening course since that time. The patient noted arthroscopy on the left knee(s).  Patient currently rates pain in the left knee(s) at 9 out of 10 with activity. Patient has night pain, worsening of pain with activity and weight bearing, pain that interferes with activities of daily living, pain with passive range of motion and joint swelling.  Patient has evidence of subchondral sclerosis, periarticular osteophytes and joint space narrowing by imaging studies. There is no active infection.  Patient Active Problem List   Diagnosis Date Noted  . Pigmented villonodular synovitis of left knee 12/30/2012  . Acute meniscal tear of knee 10/21/2012  . Chronic insomnia 09/05/2012  . Gout 09/05/2012  . Right shoulder pain 10/23/2011  . Hip pain, left 10/23/2011  . 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 CONSTIPATION 05/14/2009  . COLONIC POLYPS, HX OF 05/13/2009  . FATIGUE 02/11/2009  . DERMATITIS 03/05/2008  . HYPERTENSION 10/10/2007  . FOOT PAIN, RIGHT 10/10/2007  . UNSPECIFIED DISORDER OF THYROID 10/03/2007  . OTHER ABNORMAL GLUCOSE 10/03/2007  . HYPOTHYROIDISM 09/17/2007  . HYPERCHOLESTEROLEMIA 09/17/2007  . Osteoarthritis 09/17/2007  . BURSITIS 09/17/2007  . BREAST IMPLANTS, BILATERAL, HX OF 09/17/2007  . LAMINECTOMY, LUMBAR, HX OF 09/17/2007   Past Medical History  Diagnosis Date  . Orthopnea   . Personal history of colonic polyps   .  Contact dermatitis and other eczema, due to unspecified cause   . Unspecified disorder of thyroid   . Arthropathy, unspecified, site unspecified   . Pure hypercholesterolemia   . Unspecified hypothyroidism   . Other bursitis disorders   . PONV (postoperative nausea and vomiting)   . Unspecified essential hypertension     borderline on no meds   . Diabetes mellitus without complication     borderline on no meds   . GERD (gastroesophageal reflux disease)   . Arthritis   . Neuromuscular disorder     PVNS     Past Surgical History  Procedure Laterality Date  . Tubal ligation    . Breast surgery      breast implants, later removed  . Lumbar laminectomy    . Back surgery  2002  . Knee arthroscopy  10/21/2012    Procedure: ARTHROSCOPY KNEE;  Surgeon: Kathryne Hitch, MD;  Location: [facility];  Service: Orthopedics;  Laterality: Left;  Left Knee Arthroscopy, Debridement, partial synovectomy  . Devaited septum surgery    . Eye surgery      bilateral cataract surgery  . Blephorplasty       bilateral     Prescriptions prior to admission  Medication Sig Dispense Refill  . ciprofloxacin (CIPRO) 500 MG tablet Take 500 mg by mouth 2 (two) times daily.      Marland Kitchen HYDROcodone-acetaminophen (NORCO) 5-325 MG per tablet Take 1-2 tablets by mouth every 4 (four) hours as needed for pain (severe pain).  30 tablet  0  . omeprazole (PRILOSEC) 40 MG capsule take 1 capsule by mouth once daily  90 capsule  1  . thyroid (ARMOUR) 30  MG tablet Take 30 mg by mouth daily before breakfast.      . zolpidem (AMBIEN) 10 MG tablet Take 10 mg by mouth at bedtime as needed. Sleep      . nabumetone (RELAFEN) 500 MG tablet Take 500 mg by mouth 2 (two) times daily as needed for pain.      Marland Kitchen ondansetron (ZOFRAN) 4 MG tablet Take 1 tablet (4 mg total) by mouth every 8 (eight) hours as needed for nausea.  20 tablet  0  . traMADol (ULTRAM) 50 MG tablet Take 2 tablets (100 mg total) by mouth every 6 (six) hours as needed  for pain. Pain  60 tablet  0   Allergies  Allergen Reactions  . Penicillins Anaphylaxis    REACTION: causes tongue to swell  . Codeine Nausea Only  . Lisinopril     REACTION: wheezing    History  Substance Use Topics  . Smoking status: Never Smoker   . Smokeless tobacco: Never Used  . Alcohol Use: No    Family History  Problem Relation Age of Onset  . Cancer Mother     breast and lung  . Stroke Father   . Heart attack Father   . Diabetes Sister   . Emphysema Maternal Aunt   . Colon cancer Neg Hx      Review of Systems  Musculoskeletal: Positive for joint pain.  All other systems reviewed and are negative.    Objective:  Physical Exam  Constitutional: She is oriented to person, place, and time. She appears well-developed and well-nourished.  HENT:  Head: Normocephalic and atraumatic.  Eyes: EOM are normal. Pupils are equal, round, and reactive to light.  Neck: Normal range of motion. Neck supple.  Cardiovascular: Normal rate and regular rhythm.   Respiratory: Effort normal and breath sounds normal.  GI: Soft. Bowel sounds are normal.  Musculoskeletal:       Left knee: She exhibits decreased range of motion, swelling and effusion. Tenderness found. Medial joint line and lateral joint line tenderness noted.  Neurological: She is alert and oriented to person, place, and time.  Skin: Skin is warm and dry.  Psychiatric: She has a normal mood and affect.    Vital signs in last 24 hours: Temp:  [97.2 F (36.2 C)] 97.2 F (36.2 C) (02/28 1200) Pulse Rate:  [71] 71 (02/28 1200) Resp:  [20] 20 (02/28 1200) BP: (148)/(83) 148/83 mmHg (02/28 1200) SpO2:  [96 %] 96 % (02/28 1200)  Labs:   Estimated body mass index is 24.31 kg/(m^2) as calculated from the following:   Height as of 10/18/12: 5' (1.524 m).   Weight as of 10/18/12: 56.473 kg (124 lb 8 oz).   Imaging Review Plain radiographs demonstrate moderate degenerative joint disease of the left knee(s). The  overall alignment isneutral. The bone quality appears to be good for age and reported activity level.  Assessment/Plan:  End stage arthritis, left knee   The patient history, physical examination, clinical judgment of the provider and imaging studies are consistent with end stage degenerative joint disease of the left knee(s) and total knee arthroplasty is deemed medically necessary. The treatment options including medical management, injection therapy arthroscopy and arthroplasty were discussed at length. The risks and benefits of total knee arthroplasty were presented and reviewed. The risks due to aseptic loosening, infection, stiffness, patella tracking problems, thromboembolic complications and other imponderables were discussed. The patient acknowledged the explanation, agreed to proceed with the plan and consent was signed. Patient  is being admitted for inpatient treatment for surgery, pain control, PT, OT, prophylactic antibiotics, VTE prophylaxis, progressive ambulation and ADL's and discharge planning. The patient is planning to be discharged home with home health services

## 2012-12-30 NOTE — Progress Notes (Signed)
Pt c/o feeling "wet" at perineal area. States that she was diagnosed with UTI in pre-surgical testing and has been on Antibiotics since. 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 Entire area wet under buttocks. Deflated and re-inflated bulb on Foley cath with immediate return of clear yellow urine continuously to total 650 cc. No further c/o "leakage" around catheter.

## 2012-12-30 NOTE — Anesthesia Procedure Notes (Signed)
Spinal  Patient location during procedure: OR Start time: 12/30/2012 3:33 PM End time: 12/30/2012 3:29 PM Staffing Anesthesiologist: Lewie Loron R Performed by: anesthesiologist  Preanesthetic Checklist Completed: patient identified, site marked, surgical consent, pre-op evaluation, timeout performed, IV checked, risks and benefits discussed and monitors and equipment checked Spinal Block Patient position: sitting Prep: ChloraPrep Patient monitoring: heart rate, continuous pulse ox and blood pressure Approach: midline Location: L4-5 Injection technique: single-shot Needle Needle type: Sprotte and Quincke  Needle gauge: 22 G Needle length: 9 cm Assessment Sensory level: T10 Additional Notes Expiration date of kit checked and confirmed. Patient tolerated procedure well, without complications.

## 2012-12-30 NOTE — Anesthesia Postprocedure Evaluation (Signed)
Anesthesia Post Note  Patient: Julie [name] [place]  Procedure(s) Performed: Procedure(s) (LRB): TOTAL KNEE ARTHROPLASTY (Left)  Anesthesia type: Spinal and General  Patient location: PACU  Post pain: Pain level controlled  Post assessment: Post-op Vital signs reviewed  Last Vitals: BP 139/56  Pulse 67  Temp(Src) 36.6 C  Resp 18  SpO2 100%  Post vital signs: Reviewed  Level of consciousness: sedated  Complications: No apparent anesthesia complications

## 2012-12-30 NOTE — Preoperative (Signed)
Beta Blockers   Reason not to administer Beta Blockers:Not Applicable 

## 2012-12-30 NOTE — Transfer of Care (Signed)
Immediate Anesthesia Transfer of Care Note  Patient: Julie [name]  Procedure(s) Performed: Procedure(s) with comments: TOTAL KNEE ARTHROPLASTY (Left) - Left Total Knee Arthroplasty  Patient Location: PACU  Anesthesia Type:General and Spinal  Level of Consciousness: awake and patient cooperative  Airway & Oxygen Therapy: Patient Spontanous Breathing and Patient connected to face mask oxygen  Post-op Assessment: Report given to PACU RN and Post -op Vital signs reviewed and stable  Post vital signs: Reviewed and stable  Complications: No apparent anesthesia complications

## 2012-12-31 LAB — BASIC METABOLIC PANEL
BUN: 11 mg/dL (ref 6–23)
Chloride: 105 mEq/L (ref 96–112)
Creatinine, Ser: 0.63 mg/dL (ref 0.50–1.10)
GFR calc Af Amer: >90 mL/min (ref >90)

## 2012-12-31 LAB — CBC
HCT: 29.8 % — ABNORMAL LOW (ref 36.0–46.0)
MCH: 28.5 pg (ref 26.0–34.0)
MCV: 88.4 fL (ref 78.0–100.0)
RDW: 14.1 % (ref 11.5–15.5)
WBC: 12.8 10*3/uL — ABNORMAL HIGH (ref 4.0–10.5)

## 2012-12-31 MED ORDER — POLYETHYLENE GLYCOL 3350 17 G PO PACK
17.0000 g | PACK | Freq: Two times a day (BID) | ORAL | Status: DC
  Administered 2012-12-31 – 2013-01-01 (×2): 17 g via ORAL

## 2012-12-31 NOTE — Progress Notes (Signed)
UR completed 

## 2012-12-31 NOTE — Progress Notes (Signed)
Clinical Social Work Department BRIEF PSYCHOSOCIAL ASSESSMENT 12/31/2012  Patient:  Julie [name], Julie [name]     Account Number:  [id number]     Admit date:  12/30/2012  Clinical Social Worker:  Doroteo Glassman  Date/Time:  12/31/2012 10:44 AM  Referred by:  Physician  Date Referred:  12/31/2012 Referred for  SNF Placement   Other Referral:   Interview type:  Patient Other interview type:    PSYCHOSOCIAL DATA Living Status:  HUSBAND Admitted from facility:   Level of care:   Primary support name:  Mr. Pack Primary support relationship to patient:  SPOUSE Degree of support available:   adequate    CURRENT CONCERNS Current Concerns  Post-Acute Placement   Other Concerns:    SOCIAL WORK ASSESSMENT / PLAN Met with Pt to discuss d/c plans.    Pt confirmed that she intends to d/c to [facility], as she has visited friends there and was pleased with the facility.    Pt would like to be transported via private vehicle, if at all possible.    CSW thanked Pt for her time.   Assessment/plan status:  Psychosocial Support/Ongoing Assessment of Needs Other assessment/ plan:   Information/referral to community resources:   n/a--Pt to d/c to [place].    PATIENT'S/FAMILY'S RESPONSE TO PLAN OF CARE: Pt thanked CSW for time and assistance.   CSW to continue to follow.  Providence Crosby, LCSWA Clinical Social Work [phone]

## 2012-12-31 NOTE — Progress Notes (Signed)
Physical Therapy Treatment Patient Details Name: Julie [name] MRN: [id number] DOB: [birth year] Today's Date: 12/31/2012 Time: 1478-2956 PT Time Calculation (min): 23 min  PT Assessment / Plan / Recommendation Comments on Treatment Session  Pt progressing very well with all mobiltiy and exercises and with little c/o pain.     Follow Up Recommendations  SNF     Does the patient have the potential to tolerate intense rehabilitation     Barriers to Discharge        Equipment Recommendations  Rolling walker with 5" wheels    Recommendations for Other Services    Frequency 7X/week   Plan Discharge plan remains appropriate    Precautions / Restrictions Precautions Precautions: Knee;Fall Required Braces or Orthoses: Knee Immobilizer - Left Knee Immobilizer - Left: Discontinue once straight leg raise with < 10 degree lag Restrictions Weight Bearing Restrictions: No Other Position/Activity Restrictions: WBAT   Pertinent Vitals/Pain 2/10 pain    Mobility  Bed Mobility Bed Mobility: Supine to Sit;Sit to Supine Supine to Sit: 4: Min guard;HOB elevated Sit to Supine: 4: Min assist;HOB flat Details for Bed Mobility Assistance: Guarding assist for LLE out of bed, with some assist for LLE into bed and min cues for hand placement.  Transfers Transfers: Sit to Stand;Stand to Sit Sit to Stand: 4: Min guard;With upper extremity assist;From bed Stand to Sit: 4: Min guard;With upper extremity assist;To bed Details for Transfer Assistance: Min cues for hand placement and safety.  Ambulation/Gait Ambulation/Gait Assistance: 5: Supervision;4: Min guard Ambulation Distance (Feet): 110 Feet Assistive device: Rolling walker Ambulation/Gait Assistance Details: Min cues for step through vs step to gait pattern and to maintain upright posture.  Gait Pattern: Step-to pattern;Step-through pattern;Trunk flexed Gait velocity: decreased    Exercises Total Joint Exercises Ankle Circles/Pumps:  AROM;Both;20 reps Quad Sets: AROM;Left;10 reps Heel Slides: AAROM;Left;10 reps Hip ABduction/ADduction: AAROM;Left;10 reps Straight Leg Raises: AAROM;Left;10 reps   PT Diagnosis:    PT Problem List:   PT Treatment Interventions:     PT Goals Acute Rehab PT Goals PT Goal Formulation: With patient Time For Goal Achievement: 01/07/13 Potential to Achieve Goals: Good Pt will go Supine/Side to Sit: with supervision PT Goal: Supine/Side to Sit - Progress: Progressing toward goal Pt will go Sit to Supine/Side: with supervision PT Goal: Sit to Supine/Side - Progress: Progressing toward goal Pt will go Sit to Stand: with supervision PT Goal: Sit to Stand - Progress: Progressing toward goal Pt will go Stand to Sit: with supervision PT Goal: Stand to Sit - Progress: Progressing toward goal Pt will Ambulate: 51 - 150 feet;with supervision;with least restrictive assistive device PT Goal: Ambulate - Progress: Progressing toward goal Pt will Perform Home Exercise Program: with supervision, verbal cues required/provided PT Goal: Perform Home Exercise Program - Progress: Progressing toward goal  Visit Information  Last PT Received On: 12/31/12 Assistance Needed: +1    Subjective Data  Subjective: My pain is ok Patient Stated Goal: to return home after rehab.   Cognition  Cognition Overall Cognitive Status: Appears within functional limits for tasks assessed/performed Arousal/Alertness: Awake/alert Orientation Level: Appears intact for tasks assessed Behavior During Session: [facility] for tasks performed    Balance     End of Session PT - End of Session Equipment Utilized During Treatment: Left knee immobilizer Activity Tolerance: Patient tolerated treatment well Patient left: in bed;with call bell/phone within reach;with family/visitor present Nurse Communication: Mobility status   GP     Vista Deck 12/31/2012, 6:21 PM

## 2012-12-31 NOTE — Progress Notes (Signed)
Subjective: Pt stable - pain controlled - hv dced   Objective: Vital signs in last 24 hours: Temp:  [97.2 F (36.2 C)-99 F (37.2 C)] 97.9 F (36.6 C) (03/01 0800) Pulse Rate:  [65-86] 73 (03/01 0800) Resp:  [11-25] 18 (03/01 0800) BP: (114-172)/(55-83) 114/64 mmHg (03/01 0800) SpO2:  [96 %-100 %] 97 % (03/01 0800)  Intake/Output from previous day: 02/28 0701 - 03/01 0700 In: 3445 [P.O.:240; I.V.:3105; IV Piggyback:100] Out: 2705 [Urine:2125; Drains:530; Blood:50] Intake/Output this shift: Total I/O In: -  Out: 60 [Urine:50; Drains:10]  Exam:  Neurovascular intact Sensation intact distally Intact pulses distally Dorsiflexion/Plantar flexion intact  Labs:  Recent Labs  12/31/12 0451  HGB 9.6*    Recent Labs  12/31/12 0451  WBC 12.8*  RBC 3.37*  HCT 29.8*  PLT 234    Recent Labs  12/31/12 0451  NA 138  K 4.1  CL 105  CO2 26  BUN 11  CREATININE 0.63  GLUCOSE 160*  CALCIUM 8.6   No results found for this basename: LABPT, INR,  in the last 72 hours  Assessment/Plan: Pt doing well - cpm - dc foley - oob to chair - labs ok   DEAN,GREGORY SCOTT 12/31/2012, 8:57 AM

## 2012-12-31 NOTE — Op Note (Signed)
NAMECHARLCIE, PRISCO NO.:  [id number]  MEDICAL RECORD NO.:  [id number]  LOCATION:  1605                         FACILITY:  [facility]  PHYSICIAN:  Vanita Panda. Magnus Ivan, M.D.DATE OF BIRTH:  07-Apr-1941  DATE OF PROCEDURE:  12/30/2012 DATE OF DISCHARGE:                              OPERATIVE REPORT   PREOPERATIVE DIAGNOSES: 1. Pigmented villonodular synovitis, left knee. 2. End-stage arthritis, left knee.  POSTOPERATIVE DIAGNOSES: 1. Pigmented villonodular synovitis, left knee. 2. End-stage arthritis, left knee.  PROCEDURE:  Left total knee arthroplasty.  IMPLANTS:  Stryker triathlon knee with size 2 femur, size 2 tibial tray, 9-mm polyethylene insert, size 29 patellar button.  SURGEON:  Vanita Panda. Magnus Ivan, M.D.  ASSISTANT:  Wende Neighbors, PA-C.  ANESTHESIA: 1. Attempted spinal. 2. General.  BLOOD LOSS:  Less than 200 mL.  TOURNIQUET TIME:  53 minutes.  COMPLICATIONS:  None.  ANTIBIOTICS: 1. IV clindamycin. 2. IV Cipro.  COMPLICATIONS:  None.  INDICATIONS:  Ms. Allinson is a 72 year old female well known to me. She has had recurrent effusion about her left knee for sometime now.  We performed an arthroscopic intervention and found pigmented villonodular synovitis, and significant loss of cartilage in the knee especially in the lateral compartment.  At this point, due to recurrent effusions, the failure of conservative treatment including injections and time, she wished to proceed with total knee arthroplasty.  Her pain is daily.  Her mobility is quite limited and her recurrent effusions has been significant for her in terms of disabling.  She understands the risks of acute blood loss anemia, deep infection, nerve and vessel injury, DVT, and PE, and she does wish to proceed.  She knows the goals and decreased pain and improved mobility and quality of life.  PROCEDURE DESCRIPTION:  After informed consent was obtained, appropriate left  knee was marked.  She was brought to the operating room.  Spinal anesthesia was obtained.  She was then laid in the supine position.  A Foley catheter was placed, and a nonsterile tourniquet was placed around her upper left leg.  Her left leg was then prepped and draped with DuraPrep sterile drapes including a sterile stockinette.  A time-out was called, she was identified as the correct patient, correct left knee.  I then used an Esmarch to wrap out the leg.  Tourniquet was inflated to 300 mm of pressure but she got quite uncomfortable with this, so general anesthesia was obtained.  We then made a midline incision directly over the knee and dissected down the knee joint.  I performed a medial parapatellar arthrotomy.  Final large effusion in the knee and then findings of brown hemosiderin throughout the knee, synovium consistent with the pigmented villonodular synovitis.  I then removed remnants of the ACL, PCL, medial and lateral meniscus from the knee and cleaned off osteophytes.  First, with the knee in flexed position, attention was turned to the tibia, cutting the tibia with cutting guide on with 0 slope.  Correction of her varus and valgus, and making a 9-mm section off the high side.  We put the tibial guide in place.  We then made our tibial bone cut.  Next, attention was turned  to the femur.  We drilled the intramedullary guide for the scene and placed intramedullary drill in the intercondylar notch.  Then, placed an intramedullary guide in the femur.  We set this for 5 degrees left for a external rotated left with a distal cut of 10.  We made distal cut as well and then brought the knee back in extension with our extension block with 9 mm, she was fully extended and removed all pins.  Then, put my femoral sizing guide in place based off the epicondylar axis.  We chose a size 2 femur, so we put the form 1 cutting guide on and made our anterior and posterior cuts and our chamfer  cuts.  There was a small deficit in the bone that we noted the femoral canal.  They were packed with bone graft from the bone that we had removed.  We then did our femoral box cut and attention was turned back to the tibia.  We sized for a size 2 tibia, so we made our tibial keel cut for this.  With the trial size 2 tibia and the trial size 2 femur in place, we placed a 9-mm polyethylene insert.  I was pleased with the range of motion with stability.  I then made my patellar cut, taking a 10 mm off the undersurface patella and drilled 3 lug and placed a size 29 patellar button.  We then removed all instrumentation, removed significant amount of synovium from the pigmented villonodular synovitis.  We copiously irrigated with a liter of normal saline solution.  We then mixed the cement and cemented the real size 2 Stryker triathlon tibial tray followed by the real size 2 femur.  We placed a real 9-mm polyethylene cemented the patellar button. Once the cement had dried, we let the tourniquet down and irrigated with 1 more liter of normal saline solution.  Hemostasis was obtained with electrocautery.  We placed a medium Hemovac in the arthrotomy and closed the arthrotomy with interrupted #1 Vicryl suture followed by running 0 V- Loc suture, 2-0 Vicryl in subcutaneous tissue, and staples on the skin. Xeroform, a well-padded sterile dressing, and knee immobilizer were placed.  She was awakened, extubated, and taken to recovery room in stable condition.  All final counts correct.  There were no complications noted.  Of note, Maud Deed, PA-C was assisted throughout the entire case and her assistance was crucial in getting the case started and implant placement and completion with closure.     Vanita Panda. Magnus Ivan, M.D.     CYB/MEDQ  D:  12/30/2012  T:  12/31/2012  Job:  [id number]

## 2012-12-31 NOTE — Progress Notes (Signed)
Clinical Social Work Department CLINICAL SOCIAL WORK PLACEMENT NOTE 12/31/2012  Patient:  Julie [name], Julie [name]  Account Number:  [id number] Admit date:  12/30/2012  Clinical Social Worker:  Doroteo Glassman  Date/time:  12/31/2012 11:28 AM  Clinical Social Work is seeking post-discharge placement for this patient at the following level of care:   SKILLED NURSING   (*CSW will update this form in Epic as items are completed)     Patient/family provided with [facility] Department of Clinical Social Work's list of facilities offering this level of care within the geographic area requested by the patient (or if unable, by the patient's family).    Patient/family informed of their freedom to choose among providers that offer the needed level of care, that participate in Medicare, Medicaid or managed care program needed by the patient, have an available bed and are willing to accept the patient.    Patient/family informed of [facility]' ownership interest in [facility], as well as of the fact that they are under no obligation to receive care at this facility.  PASARR submitted to EDS on 12/30/2012 PASARR number received from EDS on 12/30/2012  FL2 transmitted to all facilities in geographic area requested by pt/family on  12/31/2012 FL2 transmitted to all facilities within larger geographic area on   Patient informed that his/her managed care company has contracts with or will negotiate with  certain facilities, including the following:     Patient/family informed of bed offers received:   Patient chooses bed at  Physician recommends and patient chooses bed at    Patient to be transferred to  on   Patient to be transferred to facility by   The following physician request were entered in Epic:   Additional Comments:  Providence Crosby, Theresia Majors Clinical Social Work [phone]

## 2012-12-31 NOTE — Evaluation (Signed)
Physical Therapy Evaluation Patient Details Name: Julie [name] MRN: [id number] DOB: 11/19/1940 Today's Date: 12/31/2012 Time: 7829-5621 PT Time Calculation (min): 27 min  PT Assessment / Plan / Recommendation Clinical Impression  Pt presents s/p L TKA POD 1 with decreased strength, ROM and mobility.  Tolerated OOB and ambulation in hallway very well with no c/o dizziness/light headedness.  Pt will benefit from skilled PT in acute venue to address deficits.  PT recommends ST SNF at D/C to maximize pts safety for eventual return home.     PT Assessment  Patient needs continued PT services    Follow Up Recommendations  SNF    Does the patient have the potential to tolerate intense rehabilitation      Barriers to Discharge None      Equipment Recommendations  Rolling walker with 5" wheels (may need RW depending on height)    Recommendations for Other Services OT consult   Frequency 7X/week    Precautions / Restrictions Precautions Precautions: Knee Required Braces or Orthoses: Knee Immobilizer - Left Knee Immobilizer - Left: Discontinue once straight leg raise with < 10 degree lag Restrictions Weight Bearing Restrictions: No Other Position/Activity Restrictions: WBAT   Pertinent Vitals/Pain Min c/o pain during session, ice packs applied      Mobility  Bed Mobility Bed Mobility: Supine to Sit Supine to Sit: 4: Min assist;HOB elevated Details for Bed Mobility Assistance: Assist for LLE out of bed with min cues for hand placement on bed to self assist.  Transfers Transfers: Sit to Stand;Stand to Sit Sit to Stand: 4: Min assist;From elevated surface;With upper extremity assist;From bed Stand to Sit: 4: Min assist;With upper extremity assist;With armrests;To chair/3-in-1 Details for Transfer Assistance: Cues for hand placement and LE management, esp when sitting as pt was somewhat impulsive to sit without reaching back.  Ambulation/Gait Ambulation/Gait Assistance: 4:  Min guard;4: Min Environmental consultant (Feet): 80 Feet Assistive device: Rolling walker Ambulation/Gait Assistance Details: Cues for sequencing/technique with RW and to maintain upright posture.  Pt actually progressed to step through gait pattern without cues on way back to room.  Gait Pattern: Step-to pattern;Step-through pattern;Trunk flexed Gait velocity: decreased Stairs: No Wheelchair Mobility Wheelchair Mobility: No    Exercises     PT Diagnosis: Difficulty walking;Abnormality of gait;Acute pain  PT Problem List: Decreased strength;Decreased range of motion;Decreased balance;Decreased mobility;Decreased activity tolerance;Decreased knowledge of use of DME;Decreased knowledge of precautions;Decreased safety awareness;Pain PT Treatment Interventions: DME instruction;Gait training;Functional mobility training;Therapeutic activities;Therapeutic exercise;Balance training;Patient/family education   PT Goals Acute Rehab PT Goals PT Goal Formulation: With patient Time For Goal Achievement: 01/07/13 Potential to Achieve Goals: Good Pt will go Supine/Side to Sit: with supervision PT Goal: Supine/Side to Sit - Progress: Goal set today Pt will go Sit to Supine/Side: with supervision PT Goal: Sit to Supine/Side - Progress: Goal set today Pt will go Sit to Stand: with supervision PT Goal: Sit to Stand - Progress: Goal set today Pt will go Stand to Sit: with supervision PT Goal: Stand to Sit - Progress: Goal set today Pt will Ambulate: 51 - 150 feet;with supervision;with least restrictive assistive device PT Goal: Ambulate - Progress: Goal set today Pt will Perform Home Exercise Program: with supervision, verbal cues required/provided PT Goal: Perform Home Exercise Program - Progress: Goal set today  Visit Information  Last PT Received On: 12/31/12 Assistance Needed: +1    Subjective Data  Subjective: I'm going to [facility] Patient Stated Goal: to return home after rehab.   Prior  Functioning  Home Living Lives With: Spouse Available Help at Discharge: Family Type of Home: House Home Access: Level entry Home Layout: One level Bathroom Shower/Tub: Engineer, manufacturing systems: Standard Home Adaptive Equipment: Shower chair with back;Walker - rolling;Straight cane Prior Function Level of Independence: Independent with assistive device(s) Able to Take Stairs?: Yes Driving: Yes Vocation: Retired Musician: No difficulties    Copywriter, advertising Overall Cognitive Status: Appears within functional limits for tasks assessed/performed Arousal/Alertness: Awake/alert Orientation Level: Appears intact for tasks assessed Behavior During Session: [facility] for tasks performed    Extremity/Trunk Assessment Right Lower Extremity Assessment RLE ROM/Strength/Tone: WFL for tasks assessed RLE Sensation: WFL - Light Touch Left Lower Extremity Assessment LLE ROM/Strength/Tone: Deficits LLE ROM/Strength/Tone Deficits: ankle motions WFL, almost able to perform SLR with little assist from therapist.  Maintained KI for safety.  LLE Sensation: WFL - Light Touch Trunk Assessment Trunk Assessment: Normal   Balance    End of Session PT - End of Session Equipment Utilized During Treatment: Left knee immobilizer Activity Tolerance: Patient tolerated treatment well Patient left: in chair;with call bell/phone within reach Nurse Communication: Mobility status  GP     Vista Deck 12/31/2012, 10:32 AM

## 2012-12-31 NOTE — Evaluation (Signed)
Occupational Therapy Evaluation Patient Details Name: Julie [name] MRN: [id number] DOB: 03/01/1941 Today's Date: 12/31/2012 Time: 0865-7846 OT Time Calculation (min): 17 min  OT Assessment / Plan / Recommendation Clinical Impression  Pt is recovering from L TKA.  Plans to go to SNF for ST rehab.  Began ADL and equipment education.  Will defer OT to SNF.    OT Assessment  All further OT needs can be met in the next venue of care    Follow Up Recommendations  SNF    Barriers to Discharge      Equipment Recommendations  None recommended by OT    Recommendations for Other Services    Frequency       Precautions / Restrictions Precautions Precautions: Knee;Fall Required Braces or Orthoses: Knee Immobilizer - Left Knee Immobilizer - Left: Discontinue once straight leg raise with < 10 degree lag Restrictions Weight Bearing Restrictions: No Other Position/Activity Restrictions: WBAT   Pertinent Vitals/Pain No c/o pain, premedicated    ADL  Eating/Feeding: Independent Where Assessed - Eating/Feeding: Bed level Grooming: Wash/dry hands;Brushing hair;Set up Where Assessed - Grooming: Supine, head of bed up Upper Body Bathing: Set up Where Assessed - Upper Body Bathing: Unsupported sitting Lower Body Bathing: Minimal assistance Where Assessed - Lower Body Bathing: Unsupported sitting;Supported sit to stand Upper Body Dressing: Set up Where Assessed - Upper Body Dressing: Unsupported sitting Lower Body Dressing: Minimal assistance Where Assessed - Lower Body Dressing: Unsupported sitting;Supported sit to stand ADL Comments: Showed pt AE for LB ADL.    OT Diagnosis: Generalized weakness;Acute pain  OT Problem List: Decreased strength;Decreased activity tolerance;Impaired balance (sitting and/or standing);Decreased knowledge of use of DME or AE;Pain OT Treatment Interventions:     OT Goals    Visit Information  Last OT Received On: 12/31/12 Assistance Needed: +1     Subjective Data  Subjective: "I'm a hair dresser." Patient Stated Goal: To [facility] for a week for therapy.   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 Communication Communication: No difficulties Dominant Hand: Right         Vision/Perception     Cognition  Cognition Overall Cognitive Status: Appears within functional limits for tasks assessed/performed Arousal/Alertness: Awake/alert Orientation Level: Appears intact for tasks assessed Behavior During Session: [facility] for tasks performed    Extremity/Trunk Assessment Right Upper Extremity Assessment RUE ROM/Strength/Tone: [facility] for tasks assessed Left Upper Extremity Assessment LUE ROM/Strength/Tone: WFL for tasks assessed     Mobility Bed Mobility Supine to Sit: 4: Min assist;HOB elevated Details for Bed Mobility Assistance: Assist for LLE out of bed with min cues for hand placement on bed to self assist.  Transfers Transfers: Sit to Stand;Stand to Sit Sit to Stand: 4: Min assist;From elevated surface;With upper extremity assist;From bed Stand to Sit: 4: Min assist;With upper extremity assist;With armrests;To chair/3-in-1 Details for Transfer Assistance: Cues for hand placement and LE management, esp when sitting as pt was somewhat impulsive to sit without reaching back.      Exercise     Balance     End of Session OT - End of Session Activity Tolerance: Patient tolerated  treatment well Patient left: in bed;with call bell/phone within reach  GO     Evern Bio 12/31/2012, 3:42 PM [phone]

## 2013-01-01 LAB — CBC
HCT: 24.7 % — ABNORMAL LOW (ref 36.0–46.0)
MCHC: 33.2 g/dL (ref 30.0–36.0)
MCV: 90.1 fL (ref 78.0–100.0)
RDW: 14.8 % (ref 11.5–15.5)

## 2013-01-01 MED ORDER — FLEET ENEMA 7-19 GM/118ML RE ENEM
1.0000 | ENEMA | Freq: Every day | RECTAL | Status: DC | PRN
  Administered 2013-01-01: 1 via RECTAL
  Filled 2013-01-01 (×2): qty 1

## 2013-01-01 MED ORDER — BISACODYL 10 MG RE SUPP
10.0000 mg | Freq: Every day | RECTAL | Status: DC | PRN
  Administered 2013-01-01: 10 mg via RECTAL
  Filled 2013-01-01: qty 1

## 2013-01-01 NOTE — Progress Notes (Signed)
Subjective: Pt stable - leg a little sore today   Objective: Vital signs in last 24 hours: Temp:  [97.2 F (36.2 C)-98.4 F (36.9 C)] 97.6 F (36.4 C) (03/02 0740) Pulse Rate:  [65-69] 65 (03/02 0740) Resp:  [16-18] 16 (03/02 0740) BP: (106-119)/(63-73) 109/65 mmHg (03/02 0740) SpO2:  [95 %-98 %] 95 % (03/02 0740)  Intake/Output from previous day: 03/01 0701 - 03/02 0700 In: 1578.8 [P.O.:480; I.V.:1098.8] Out: 690 [Urine:680; Drains:10] Intake/Output this shift: Total I/O In: 240 [P.O.:240] Out: 300 [Urine:300]  Exam:  Sensation intact distally Intact pulses distally Dorsiflexion/Plantar flexion intact  Labs:  Recent Labs  12/31/12 0451 01/01/13 0429  HGB 9.6* 8.2*    Recent Labs  12/31/12 0451 01/01/13 0429  WBC 12.8* 11.5*  RBC 3.37* 2.74*  HCT 29.8* 24.7*  PLT 234 176    Recent Labs  12/31/12 0451  NA 138  K 4.1  CL 105  CO2 26  BUN 11  CREATININE 0.63  GLUCOSE 160*  CALCIUM 8.6   No results found for this basename: LABPT, INR,  in the last 72 hours  Assessment/Plan: Pt doing well - cont PT   DEAN,GREGORY SCOTT 01/01/2013, 9:57 AM

## 2013-01-01 NOTE — Progress Notes (Signed)
Physical Therapy Treatment Patient Details Name: Julie [name] MRN: [id number] DOB: 07-20-1941 Today's Date: 01/01/2013 Time: 2536-6440 PT Time Calculation (min): 28 min  PT Assessment / Plan / Recommendation Comments on Treatment Session  Pt progressing very well with all mobiltiy and exercises and with little c/o pain.     Follow Up Recommendations  SNF     Does the patient have the potential to tolerate intense rehabilitation     Barriers to Discharge        Equipment Recommendations  Rolling walker with 5" wheels    Recommendations for Other Services    Frequency 7X/week   Plan Discharge plan remains appropriate    Precautions / Restrictions Precautions Precautions: Knee;Fall Required Braces or Orthoses: Knee Immobilizer - Left Knee Immobilizer - Left: Discontinue once straight leg raise with < 10 degree lag Restrictions Other Position/Activity Restrictions: WBAT   Pertinent Vitals/Pain     Mobility  Bed Mobility Bed Mobility: Supine to Sit;Sit to Supine Supine to Sit: 4: Min guard;HOB elevated Sit to Supine: 4: Min assist;HOB flat Details for Bed Mobility Assistance: Guarding assist for LLE out of bed, with some assist for LLE into bed and min cues for hand placement.  Transfers Transfers: Sit to Stand;Stand to Sit Sit to Stand: 4: Min guard;With upper extremity assist;From chair/3-in-1;From bed Stand to Sit: 4: Min guard;4: Min assist;To chair/3-in-1;To bed Details for Transfer Assistance: Min cues for hand placement and safety.  Ambulation/Gait Ambulation/Gait Assistance: 5: Supervision;4: Min guard Ambulation Distance (Feet): 120 Feet Assistive device: Rolling walker Ambulation/Gait Assistance Details: cues for Rw position, step length Gait Pattern: Step-to pattern;Step-through pattern;Antalgic Gait velocity: decreased    Exercises Total Joint Exercises Ankle Circles/Pumps: AROM;Both;20 reps Quad Sets: AROM;Left;10 reps Heel Slides: AAROM;Left;10  reps Hip ABduction/ADduction: AAROM;Left;10 reps Straight Leg Raises: AAROM;Left;10 reps   PT Diagnosis:    PT Problem List:   PT Treatment Interventions:     PT Goals Acute Rehab PT Goals Time For Goal Achievement: 01/07/13 Potential to Achieve Goals: Good Pt will go Supine/Side to Sit: with supervision PT Goal: Supine/Side to Sit - Progress: Progressing toward goal Pt will go Sit to Supine/Side: with supervision PT Goal: Sit to Supine/Side - Progress: Progressing toward goal Pt will go Sit to Stand: with supervision PT Goal: Sit to Stand - Progress: Progressing toward goal Pt will go Stand to Sit: with supervision PT Goal: Stand to Sit - Progress: Progressing toward goal Pt will Ambulate: 51 - 150 feet;with supervision;with least restrictive assistive device PT Goal: Ambulate - Progress: Progressing toward goal Pt will Perform Home Exercise Program: with supervision, verbal cues required/provided PT Goal: Perform Home Exercise Program - Progress: Progressing toward goal  Visit Information  Last PT Received On: 01/01/13 Assistance Needed: +1    Subjective Data  Subjective: just sore Patient Stated Goal: to return home after rehab.   Cognition  Cognition Overall Cognitive Status: Appears within functional limits for tasks assessed/performed Arousal/Alertness: Awake/alert Orientation Level: Appears intact for tasks assessed Behavior During Session: [facility] for tasks performed    Balance     End of Session PT - End of Session Equipment Utilized During Treatment: Left knee immobilizer Activity Tolerance: Patient tolerated treatment well Patient left: in bed;with call bell/phone within reach CPM Left Knee CPM Left Knee: On   GP     [facility] 01/01/2013, 4:49 PM

## 2013-01-02 ENCOUNTER — Encounter (HOSPITAL_COMMUNITY): Payer: Self-pay | Admitting: Orthopaedic Surgery

## 2013-01-02 LAB — CBC
HCT: 25.7 % — ABNORMAL LOW (ref 36.0–46.0)
Hemoglobin: 8.4 g/dL — ABNORMAL LOW (ref 12.0–15.0)
MCH: 29.7 pg (ref 26.0–34.0)
MCHC: 32.7 g/dL (ref 30.0–36.0)
MCV: 90.8 fL (ref 78.0–100.0)
Platelets: 189 10*3/uL (ref 150–400)
RBC: 2.83 MIL/uL — ABNORMAL LOW (ref 3.87–5.11)
RDW: 14.4 % (ref 11.5–15.5)
WBC: 12 10*3/uL — ABNORMAL HIGH (ref 4.0–10.5)

## 2013-01-02 MED ORDER — OXYCODONE-ACETAMINOPHEN 5-325 MG PO TABS: 1.0000 | ORAL_TABLET | ORAL | Status: DC | PRN

## 2013-01-02 MED ORDER — METHOCARBAMOL 500 MG PO TABS: 500.0000 mg | ORAL_TABLET | Freq: Four times a day (QID) | ORAL | Status: DC | PRN

## 2013-01-02 MED ORDER — ASPIRIN EC 325 MG PO TBEC: 325.0000 mg | DELAYED_RELEASE_TABLET | Freq: Two times a day (BID) | ORAL | Status: DC

## 2013-01-02 NOTE — Care Management Note (Signed)
    Page 1 of 1   01/02/2013     10:10:18 AM   CARE MANAGEMENT NOTE 01/02/2013  Patient:  Julie [name], Julie [name]   Account Number:  [id number]  Date Initiated:  01/02/2013  Documentation initiated by:  Lorenda Ishihara  Subjective/Objective Assessment:   72 yo female admitted s/p total knee arthroplasty. PTA lived at home with spouse.     Action/Plan:   Plan for SNF for rehab   Anticipated DC Date:  01/02/2013   Anticipated DC Plan:  SKILLED NURSING FACILITY  In-house referral  Clinical Social Worker      DC Planning Services  CM consult      Choice offered to / List presented to:             Status of service:  Completed, signed off Medicare Important Message given?   (If response is "NO", the following Medicare IM given date fields will be blank) Date Medicare IM given:   Date Additional Medicare IM given:    Discharge Disposition:  SKILLED NURSING FACILITY  Per UR Regulation:  Reviewed for med. necessity/level of care/duration of stay  If discussed at Long Length of Stay Meetings, dates discussed:    Comments:

## 2013-01-02 NOTE — Discharge Summary (Signed)
Patient ID: Julie [name] MRN: [id number] DOB/AGE: [birth year] 72 y.o.  Admit date: 12/30/2012 Discharge date: 01/02/2013  Admission Diagnoses:  Principal Problem:   Pigmented villonodular synovitis of left knee   Discharge Diagnoses:  Same  Past Medical History  Diagnosis Date  . Orthopnea   . Personal history of colonic polyps   . Contact dermatitis and other eczema, due to unspecified cause   . Unspecified disorder of thyroid   . Arthropathy, unspecified, site unspecified   . Pure hypercholesterolemia   . Unspecified hypothyroidism   . Other bursitis disorders   . PONV (postoperative nausea and vomiting)   . Unspecified essential hypertension     borderline on no meds   . Diabetes mellitus without complication     borderline on no meds   . GERD (gastroesophageal reflux disease)   . Arthritis   . Neuromuscular disorder     PVNS     Surgeries: Procedure(s): TOTAL KNEE ARTHROPLASTY on 12/30/2012   Consultants:    Discharged Condition: Improved  Hospital Course: [name] is an 72 y.o. female who was admitted 12/30/2012 for operative treatment ofPigmented villonodular synovitis of left knee. Patient has severe unremitting pain that affects sleep, daily activities, and work/hobbies. After pre-op clearance the patient was taken to the operating room on 12/30/2012 and underwent  Procedure(s): TOTAL KNEE ARTHROPLASTY.    Patient was given perioperative antibiotics: Anti-infectives   Start     Dose/Rate Route Frequency Ordered Stop   12/31/12 0800  ciprofloxacin (CIPRO) tablet 500 mg     500 mg Oral 2 times daily 12/30/12 1858     12/30/12 2200  ciprofloxacin (CIPRO) IVPB 400 mg  Status:  Discontinued     400 mg 200 mL/hr over 60 Minutes Intravenous Every 12 hours 12/30/12 1550 12/30/12 1848   12/30/12 2200  clindamycin (CLEOCIN) IVPB 600 mg     600 mg 100 mL/hr over 30 Minutes Intravenous Every 6 hours 12/30/12 1858 12/31/12 0514   12/30/12 1230  clindamycin  (CLEOCIN) IVPB 900 mg     900 mg 100 mL/hr over 30 Minutes Intravenous On call to O.R. 12/30/12 1204 12/30/12 1542       Patient was given sequential compression devices, early ambulation, and chemoprophylaxis to prevent DVT.  Patient benefited maximally from hospital stay and there were no complications.    Recent vital signs: Patient Vitals for the past 24 hrs:  BP Temp Temp src Pulse Resp SpO2 Height Weight  01/02/13 0535 124/76 mmHg 98.5 F (36.9 C) Oral 75 20 95 % - -  01/01/13 2113 124/69 mmHg 98.6 F (37 C) Oral 72 18 95 % - -  01/01/13 1428 107/58 mmHg 98.3 F (36.8 C) Oral 68 16 96 % - -  01/01/13 1244 - - - - - - 4' 11.5" (1.511 m) 56.246 kg (124 lb)     Recent laboratory studies:  Recent Labs  12/31/12 0451 01/01/13 0429 01/02/13 0415  WBC 12.8* 11.5* 12.0*  HGB 9.6* 8.2* 8.4*  HCT 29.8* 24.7* 25.7*  PLT 234 176 189  NA 138  --   --   K 4.1  --   --   CL 105  --   --   CO2 26  --   --   BUN 11  --   --   CREATININE 0.63  --   --   GLUCOSE 160*  --   --   CALCIUM 8.6  --   --  Discharge Medications:     Medication List    STOP taking these medications       HYDROcodone-acetaminophen 5-325 MG per tablet  Commonly known as:  NORCO     nabumetone 500 MG tablet  Commonly known as:  RELAFEN      TAKE these medications       aspirin EC 325 MG tablet  Take 1 tablet (325 mg total) by mouth 2 (two) times daily after a meal.     ciprofloxacin 500 MG tablet  Commonly known as:  CIPRO  Take 500 mg by mouth 2 (two) times daily.     methocarbamol 500 MG tablet  Commonly known as:  ROBAXIN  Take 1 tablet (500 mg total) by mouth every 6 (six) hours as needed.     omeprazole 40 MG capsule  Commonly known as:  PRILOSEC  take 1 capsule by mouth once daily     ondansetron 4 MG tablet  Commonly known as:  ZOFRAN  Take 1 tablet (4 mg total) by mouth every 8 (eight) hours as needed for nausea.     oxyCODONE-acetaminophen 5-325 MG per tablet   Commonly known as:  ROXICET  Take 1-2 tablets by mouth every 4 (four) hours as needed for pain.     thyroid 30 MG tablet  Commonly known as:  ARMOUR  Take 30 mg by mouth daily before breakfast.     traMADol 50 MG tablet  Commonly known as:  ULTRAM  Take 2 tablets (100 mg total) by mouth every 6 (six) hours as needed for pain. Pain     zolpidem 10 MG tablet  Commonly known as:  AMBIEN  Take 10 mg by mouth at bedtime as needed. Sleep        Diagnostic Studies: Dg Knee Left Port  12/30/2012  *RADIOLOGY REPORT*  Clinical Data: 72 year old female status post total knee replacement.  PORTABLE LEFT KNEE - 1-2 VIEW  Comparison: None.  Findings: Portable AP and cross-table lateral views at 1742 hours. Sequelae of left total knee arthroplasty.  Hardware intact and alignment within normal limits.  Postoperative drain in place. Overlying skin staples.  Postoperative changes to the soft tissues.  IMPRESSION: Left total knee arthroplasty with no adverse features identified.   Original Report Authenticated By: Erskine Speed, M.D.     Disposition:  To skilled nursing      Discharge Orders   Future Orders Complete By Expires     Call MD / Call 911  As directed     Comments:      If you experience chest pain or shortness of breath, CALL 911 and be transported to the hospital emergency room.  If you develope a fever above 101 F, pus (white drainage) or increased drainage or redness at the wound, or calf pain, call your surgeon's office.    Constipation Prevention  As directed     Comments:      Drink plenty of fluids.  Prune juice may be helpful.  You may use a stool softener, such as Colace (over the counter) 100 mg twice a day.  Use MiraLax (over the counter) for constipation as needed.    Diet - low sodium heart healthy  As directed     Discharge instructions  As directed     Comments:      Increase activities as comfort allows. Full weight bearing and range of motion left knee. Can get left  knee incision wet in the shower starting 01/04/13,  then dry dressing daily. Neosprin daily to left shin skin tear.    Discharge patient  As directed     Increase activity slowly as tolerated  As directed        Follow-up Information   Follow up with Kathryne Hitch, MD In 2 weeks.   Contact information:   [address] [place] [address] [phone]        Signed: Kathryne Hitch 01/02/2013, 7:52 AM

## 2013-01-02 NOTE — Progress Notes (Signed)
Clinical Social Work Department CLINICAL SOCIAL WORK PLACEMENT NOTE 01/02/2013  Patient:  [name], Julie [name]  Account Number:  [id number] Admit date:  12/30/2012  Clinical Social Worker:  Doroteo Glassman  Date/time:  12/31/2012 11:28 AM  Clinical Social Work is seeking post-discharge placement for this patient at the following level of care:   SKILLED NURSING   (*CSW will update this form in Epic as items are completed)     Patient/family provided with [facility] Department of Clinical Social Work's list of facilities offering this level of care within the geographic area requested by the patient (or if unable, by the patient's family).    Patient/family informed of their freedom to choose among providers that offer the needed level of care, that participate in Medicare, Medicaid or managed care program needed by the patient, have an available bed and are willing to accept the patient.    Patient/family informed of [facility]' ownership interest in [facility], as well as of the fact that they are under no obligation to receive care at this facility.  PASARR submitted to EDS on 12/30/2012 PASARR number received from EDS on 12/30/2012  FL2 transmitted to all facilities in geographic area requested by pt/family on  12/31/2012 FL2 transmitted to all facilities within larger geographic area on   Patient informed that his/her managed care company has contracts with or will negotiate with  certain facilities, including the following:     Patient/family informed of bed offers received:  12/31/2012 Patient chooses bed at [facility] Physician recommends and patient chooses bed at    Patient to be transferred to [facility] on  01/02/2013 Patient to be transferred to facility by [facility]  The following physician request were entered in Epic:   Additional Comments:  Cori Razor LCSW [phone]

## 2013-01-02 NOTE — Progress Notes (Signed)
Physical Therapy Treatment Patient Details Name: Julie [name] MRN: [id number] DOB: 1941/05/22 Today's Date: 01/02/2013 Time: 4098-1191 PT Time Calculation (min): 41 min  PT Assessment / Plan / Recommendation Comments on Treatment Session  POD # 3 L TKR pt plans to D/C to SNF for ST Rehab.  Pt asked about going to facility by truck.  Simulated high truck and has pt step up one step backward using RW.  Pt performed at [employer] assist.  Amb and performed TKR TE's.    Follow Up Recommendations  SNF     Does the patient have the potential to tolerate intense rehabilitation     Barriers to Discharge        Equipment Recommendations  Rolling walker with 5" wheels    Recommendations for Other Services    Frequency 7X/week   Plan Discharge plan remains appropriate    Precautions / Restrictions Precautions Precautions: Knee;Fall Precaution Comments: pt instructed on KI use Required Braces or Orthoses: Knee Immobilizer - Left Knee Immobilizer - Left: Discontinue once straight leg raise with < 10 degree lag Restrictions Weight Bearing Restrictions: No Other Position/Activity Restrictions: WBAT   Pertinent Vitals/Pain C/o 4/10 L knee pain during TE's ICE applied    Mobility  Bed Mobility Bed Mobility: Supine to Sit;Sit to Supine Supine to Sit: 4: Min guard;HOB elevated Sit to Supine: 4: Min assist;HOB flat Details for Bed Mobility Assistance: Guarding assist for LLE out of bed, with some assist for LLE into bed and min cues for hand placement.  Transfers Transfers: Sit to Stand;Stand to Sit Sit to Stand: 4: Min guard;With upper extremity assist;From chair/3-in-1;From bed Stand to Sit: 4: Min guard;4: Min assist;To chair/3-in-1;To bed Details for Transfer Assistance: Min cues for hand placement and safety.  Ambulation/Gait Ambulation/Gait Assistance: 5: Supervision;4: Min guard Ambulation Distance (Feet): 15 Feet Assistive device: Rolling walker Ambulation/Gait Assistance  Details: one VC for safety with turns staying within RW when turned to wash hands at sink vs reach accross beyong front RW. Gait Pattern: Step-to pattern;Step-through pattern;Antalgic Gait velocity: decreased Stairs: No    Exercises Total Joint Exercises Ankle Circles/Pumps: AROM;Both;20 reps;Supine Quad Sets: AROM;10 reps;Both;Supine Gluteal Sets: AROM;Both;10 reps;Supine Towel Squeeze: AROM;Both;10 reps;Supine Short Arc Quad: AAROM;Left;10 reps;Supine Heel Slides: AAROM;Left;10 reps;Supine Hip ABduction/ADduction: AROM;Left;10 reps;Supine Straight Leg Raises: AAROM;Left;10 reps;Supine    PT Goals                                                               progressing    Visit Information  Last PT Received On: 01/02/13 Assistance Needed: +1    Subjective Data      Cognition    good   Balance   fair +  End of Session PT - End of Session Equipment Utilized During Treatment: Left knee immobilizer;Gait belt Activity Tolerance: Patient tolerated treatment well Patient left: in bed;with call bell/phone within reach (ICE to L knee) Nurse Communication: Mobility status   Felecia Shelling  PTA [facility]  Acute  Rehab Pager      [phone]

## 2013-01-23 ENCOUNTER — Encounter: Payer: Self-pay | Admitting: *Deleted

## 2013-01-30 ENCOUNTER — Encounter: Payer: Self-pay | Admitting: Internal Medicine

## 2013-01-30 ENCOUNTER — Ambulatory Visit (INDEPENDENT_AMBULATORY_CARE_PROVIDER_SITE_OTHER): Payer: Medicare Other | Admitting: Internal Medicine

## 2013-01-30 VITALS — BP 142/74 | Temp 97.9°F | Wt 117.0 lb

## 2013-01-30 DIAGNOSIS — M810 Age-related osteoporosis without current pathological fracture: Secondary | ICD-10-CM

## 2013-01-30 DIAGNOSIS — R197 Diarrhea, unspecified: Secondary | ICD-10-CM

## 2013-01-30 DIAGNOSIS — D62 Acute posthemorrhagic anemia: Secondary | ICD-10-CM

## 2013-01-30 LAB — VITAMIN B12: Vitamin B-12: 283 pg/mL (ref 211–911)

## 2013-01-30 NOTE — Patient Instructions (Addendum)
Our office will contact you re: stool test results Consider using Prolia for treating your osteoporosis

## 2013-01-30 NOTE — Assessment & Plan Note (Addendum)
Her hemoglobin in 01/02/2013 was 8.4. Patient advised to start oral iron replacement. If no improvement in her blood counts consider IV iron. She may have difficulty absorbing oral iron due to chronic PPI use.

## 2013-01-30 NOTE — Assessment & Plan Note (Signed)
Patient's bone density scan in January of 2014 shows osteoporosis. Her T score in right hip was -2.8. She is currently experiencing gastric irritation due to aspirin use. She is not a candidate for oral bisphosphonates.  I recommended patient consider use Prolia injections q. 6 months. She will discuss this with her orthopedic physician.

## 2013-01-30 NOTE — Assessment & Plan Note (Signed)
Patient experiencing loose stools. She was recently hospitalized for left total knee replacement was given clindamycin. Check stools for C. Difficile colitis.

## 2013-01-30 NOTE — Progress Notes (Signed)
Subjective:    Patient ID: Julie [name], female    DOB: 02/09/1941, 72 y.o.   MRN: [id number]  HPI  72 year old white female with history of hypothyroidism and recent left knee pain for followup. Patient noted to have pigmented villonodular synovitis of left knee. Patient underwent total left knee replacement without significant complications. Patient initially constipated but now having issues with diarrhea. Patient was treated with clindamycin during hospitalization.  Osteoporosis-her bone density in January of 2014 showed worsening osteoporosis. Patient's T score in right hip was -2.8.  She has been taking 2 tablets of aspirin 325 mg once daily since her knee replacement. She reports stomach irritation.  Medical records reviewed. Patient has postop anemia. Her last hemoglobin -  Lab Results  Component Value Date   HGB 8.4* 01/02/2013     Review of Systems    fatigue, mild swelling of left knee Negative for fever or chills  Past Medical History  Diagnosis Date  . Orthopnea   . Personal history of colonic polyps   . Contact dermatitis and other eczema, due to unspecified cause   . Unspecified disorder of thyroid   . Arthropathy, unspecified, site unspecified   . Pure hypercholesterolemia   . Unspecified hypothyroidism   . Other bursitis disorders   . PONV (postoperative nausea and vomiting)   . Unspecified essential hypertension     borderline on no meds   . Diabetes mellitus without complication     borderline on no meds   . GERD (gastroesophageal reflux disease)   . Arthritis   . Neuromuscular disorder     PVNS     History   Social History  . Marital Status: Married    Spouse Name: N/A    Number of Children: N/A  . Years of Education: N/A   Occupational History  . Not on file.   Social History Main Topics  . Smoking status: Never Smoker   . Smokeless tobacco: Never Used  . Alcohol Use: No  . Drug Use: No  . Sexually Active: Not on file   Other  Topics Concern  . Not on file   Social History Narrative  . No narrative on file    Past Surgical History  Procedure Laterality Date  . Tubal ligation    . Breast surgery      breast implants, later removed  . Lumbar laminectomy    . Back surgery  2002  . Knee arthroscopy  10/21/2012    Procedure: ARTHROSCOPY KNEE;  Surgeon: Kathryne Hitch, MD;  Location: [facility];  Service: Orthopedics;  Laterality: Left;  Left Knee Arthroscopy, Debridement, partial synovectomy  . Devaited septum surgery    . Eye surgery      bilateral cataract surgery  . Blephorplasty       bilateral   . Total knee arthroplasty Left 12/30/2012    Procedure: TOTAL KNEE ARTHROPLASTY;  Surgeon: Kathryne Hitch, MD;  Location: [facility];  Service: Orthopedics;  Laterality: Left;  Left Total Knee Arthroplasty    Family History  Problem Relation Age of Onset  . Cancer Mother     breast and lung  . Stroke Father   . Heart attack Father   . Diabetes Sister   . Emphysema Maternal Aunt   . Colon cancer Neg Hx     Allergies  Allergen Reactions  . Penicillins Anaphylaxis    REACTION: causes tongue to swell  . Codeine Nausea Only  . Lisinopril     REACTION:  wheezing    Current Outpatient Prescriptions on File Prior to Visit  Medication Sig Dispense Refill  . aspirin EC 325 MG tablet Take 1 tablet (325 mg total) by mouth 2 (two) times daily after a meal.  30 tablet  0  . methocarbamol (ROBAXIN) 500 MG tablet Take 1 tablet (500 mg total) by mouth every 6 (six) hours as needed.  30 tablet  0  . omeprazole (PRILOSEC) 40 MG capsule take 1 capsule by mouth once daily  90 capsule  1  . ondansetron (ZOFRAN) 4 MG tablet Take 1 tablet (4 mg total) by mouth every 8 (eight) hours as needed for nausea.  20 tablet  0  . thyroid (ARMOUR) 30 MG tablet Take 30 mg by mouth daily before breakfast.      . traMADol (ULTRAM) 50 MG tablet Take 2 tablets (100 mg total) by mouth every 6 (six) hours as needed for pain. Pain   60 tablet  0  . zolpidem (AMBIEN) 10 MG tablet Take 10 mg by mouth at bedtime as needed. Sleep       No current facility-administered medications on file prior to visit.    BP 142/74  Temp(Src) 97.9 F (36.6 C) (Oral)  Wt 117 lb (53.071 kg)  BMI 23.24 kg/m2    Objective:   Physical Exam  Constitutional: She is oriented to person, place, and time. She appears well-developed and well-nourished.  HENT:  Head: Normocephalic and atraumatic.  Cardiovascular: Normal rate, regular rhythm and normal heart sounds.   No murmur heard. Pulmonary/Chest: Effort normal and breath sounds normal. She has no wheezes.  Neurological: She is alert and oriented to person, place, and time. No cranial nerve deficit.  Skin: Skin is warm and dry.  Psychiatric: She has a normal mood and affect. Her behavior is normal.          Assessment & Plan:

## 2013-02-01 LAB — CLOSTRIDIUM DIFFICILE BY PCR: Toxigenic C. Difficile by PCR: NOT DETECTED

## 2013-02-22 ENCOUNTER — Other Ambulatory Visit: Payer: Self-pay | Admitting: Internal Medicine

## 2013-02-22 NOTE — Telephone Encounter (Signed)
Routed to Dr Kirtland Bouchard for approval since Dr Artist Pais is out of the office

## 2013-02-23 NOTE — Telephone Encounter (Signed)
ok 

## 2013-03-11 ENCOUNTER — Other Ambulatory Visit: Payer: Self-pay | Admitting: Internal Medicine

## 2013-03-19 ENCOUNTER — Ambulatory Visit (INDEPENDENT_AMBULATORY_CARE_PROVIDER_SITE_OTHER): Payer: Medicare Other | Admitting: Family Medicine

## 2013-03-19 VITALS — BP 135/75 | HR 78 | Temp 97.8°F | Resp 18 | Ht 60.0 in | Wt 125.0 lb

## 2013-03-19 DIAGNOSIS — K2970 Gastritis, unspecified, without bleeding: Secondary | ICD-10-CM

## 2013-03-19 DIAGNOSIS — M810 Age-related osteoporosis without current pathological fracture: Secondary | ICD-10-CM

## 2013-03-19 DIAGNOSIS — R7303 Prediabetes: Secondary | ICD-10-CM

## 2013-03-19 DIAGNOSIS — M1990 Unspecified osteoarthritis, unspecified site: Secondary | ICD-10-CM

## 2013-03-19 DIAGNOSIS — D649 Anemia, unspecified: Secondary | ICD-10-CM

## 2013-03-19 DIAGNOSIS — M109 Gout, unspecified: Secondary | ICD-10-CM

## 2013-03-19 DIAGNOSIS — R7309 Other abnormal glucose: Secondary | ICD-10-CM

## 2013-03-19 DIAGNOSIS — M064 Inflammatory polyarthropathy: Secondary | ICD-10-CM

## 2013-03-19 LAB — URIC ACID: Uric Acid, Serum: 4.6 mg/dL (ref 2.4–7.0)

## 2013-03-19 LAB — POCT CBC
HCT, POC: 38.9 % (ref 37.7–47.9)
Lymph, poc: 3 (ref 0.6–3.4)
MCHC: 30.1 g/dL — AB (ref 31.8–35.4)
POC Granulocyte: 11.2 — AB (ref 2–6.9)
POC LYMPH PERCENT: 20.2 %L (ref 10–50)
POC MID %: 4.8 %M (ref 0–12)
RDW, POC: 15.9 %

## 2013-03-19 MED ORDER — METHYLPREDNISOLONE 4 MG PO KIT: PACK | ORAL | Status: DC

## 2013-03-19 NOTE — Patient Instructions (Addendum)
Take 2 tabs of the colchicine now and take another tab in an hour.  This may cause some diarrhea.  If you are still having any pain and swelling in your wrist tomorrow morning, then start the medrol dose pack but if your wrist is much better, do NOT use the medrol dose pack and instead start on meloxicam 15mg  once per day (which you should have at home.)  If you do need the course of steroids (medrol dose pack), start taking the meloxicam and consider restarting your allopurinol once your symptoms have completely resolved and your wrist is back to baseline.  If the wrist is getting worse AT ALL with any increased redness, swelling, warmth, or tenderness, return to clinic immediately.

## 2013-03-19 NOTE — Progress Notes (Signed)
Subjective:    Patient ID: Julie [name], female    DOB: February 27, 1941, 72 y.o.   MRN: [id number] Chief Complaint  Patient presents with  . Wrist Pain    pain left at base of left thumb x yesterday morning; hisotry of gout    HPI  Yesterday, pt woke in the morning w/ left wrist/hand pain on the radial side of posterior hand swollen.  Has never tried colchicine before but had some rx'ed so took 1 tab colchicine 0.6mg  last night and again took another this morning. Prior she had only had gout flair in Rt foot - has never had any hand or wrist problems before but sudden onset of severity pain and swelling w/o injury or inciting event reminded her of her prior gout flairs.  No injury. No prior problems w/ wrist.  Has been fatigued since her knee replacement but not otherwise feeling ill at all.  Past Medical History  Diagnosis Date  . Orthopnea   . Personal history of colonic polyps   . Contact dermatitis and other eczema, due to unspecified cause   . Unspecified disorder of thyroid   . Arthropathy, unspecified, site unspecified   . Pure hypercholesterolemia   . Unspecified hypothyroidism   . Other bursitis disorders   . PONV (postoperative nausea and vomiting)   . Unspecified essential hypertension     borderline on no meds   . Diabetes mellitus without complication     borderline on no meds   . GERD (gastroesophageal reflux disease)   . Arthritis   . Neuromuscular disorder     PVNS    Current Outpatient Prescriptions on File Prior to Visit  Medication Sig Dispense Refill  . HYDROcodone-acetaminophen (NORCO/VICODIN) 5-325 MG per tablet Take 1 tablet by mouth every 6 (six) hours.      Marland Kitchen omeprazole (PRILOSEC) 40 MG capsule take 1 capsule by mouth once daily  90 capsule  1  . thyroid (ARMOUR) 30 MG tablet Take 30 mg by mouth daily before breakfast.      . traMADol (ULTRAM) 50 MG tablet Take 2 tablets (100 mg total) by mouth every 6 (six) hours as needed for pain. Pain  60 tablet  0   . zolpidem (AMBIEN) 10 MG tablet take 1 tablet by mouth at bedtime if needed  30 tablet  0  . aspirin EC 325 MG tablet Take 1 tablet (325 mg total) by mouth 2 (two) times daily after a meal.  30 tablet  0  . methocarbamol (ROBAXIN) 500 MG tablet Take 1 tablet (500 mg total) by mouth every 6 (six) hours as needed.  30 tablet  0  . ondansetron (ZOFRAN) 4 MG tablet Take 1 tablet (4 mg total) by mouth every 8 (eight) hours as needed for nausea.  20 tablet  0   No current facility-administered medications on file prior to visit.   Allergies  Allergen Reactions  . Penicillins Anaphylaxis    REACTION: causes tongue to swell  . Codeine Nausea Only  . Lisinopril     REACTION: wheezing     Review of Systems  Constitutional: Positive for activity change and fatigue. Negative for fever, chills, diaphoresis, appetite change and unexpected weight change.  Genitourinary: Negative for dysuria, urgency and decreased urine volume.  Musculoskeletal: Positive for myalgias, joint swelling, arthralgias and gait problem. Negative for back pain.  Skin: Positive for color change. Negative for pallor, rash and wound.  Neurological: Negative for weakness and numbness.  BP 135/75  Pulse 78  Temp(Src) 97.8 F (36.6 C)  Resp 18  Ht 5' (1.524 m)  Wt 125 lb (56.7 kg)  BMI 24.41 kg/m2  SpO2 97% Objective:   Physical Exam  Constitutional: She is oriented to person, place, and time. She appears well-developed and well-nourished. No distress.  HENT:  Head: Normocephalic and atraumatic.  Right Ear: External ear normal.  Eyes: Conjunctivae are normal. No scleral icterus.  Pulmonary/Chest: Effort normal.  Musculoskeletal:       Left wrist: She exhibits decreased range of motion, tenderness and swelling. She exhibits no bony tenderness, no crepitus and no laceration.       Left hand: She exhibits decreased range of motion and tenderness. She exhibits normal capillary refill. Normal sensation noted.  Decreased strength noted. She exhibits finger abduction, thumb/finger opposition and wrist extension trouble.  Volar surface of left wrist, radial aspect, with warmth, erythema, visible swelling, severe tenderness  Neurological: She is alert and oriented to person, place, and time.  Skin: Skin is warm and dry. She is not diaphoretic. No erythema.  Psychiatric: She has a normal mood and affect. Her behavior is normal.      Results for orders placed in visit on 03/19/13  POCT CBC      Result Value Range   WBC 14.9 (*) 4.6 - 10.2 K/uL   Lymph, poc 3.0  0.6 - 3.4   POC LYMPH PERCENT 20.2  10 - 50 %L   MID (cbc) 0.7  0 - 0.9   POC MID % 4.8  0 - 12 %M   POC Granulocyte 11.2 (*) 2 - 6.9   Granulocyte percent 75.0  37 - 80 %G   RBC 4.34  4.04 - 5.48 M/uL   Hemoglobin 11.7 (*) 12.2 - 16.2 g/dL   HCT, POC 40.9  81.1 - 47.9 %   MCV 89.7  80 - 97 fL   MCH, POC 27.0  27 - 31.2 pg   MCHC 30.1 (*) 31.8 - 35.4 g/dL   RDW, POC 91.4     Platelet Count, POC 324  142 - 424 K/uL   MPV 9.5  0 - 99.8 fL  GLUCOSE, POCT (MANUAL RESULT ENTRY)      Result Value Range   POC Glucose 139 (*) 70 - 99 mg/dl    Assessment & Plan:  Osteoporosis  Osteoarthritis  Gout - Plan: POCT SEDIMENTATION RATE, POCT CBC, Uric Acid - will start with higher dose of colchicine to try to abort - see pt instructions - take 2 tabs po x 1 now and follow by another in 1 hr.  If still having swelling and pain tomorrow morning, start medrol dose pack. If sxs largely resolve, do NOT use steroid and instead start daily meloxicam.  Concerned about leukocytosis so warned if ANY signs of worsening infection, need to be seen immediately.  Prediabetes - Plan: POCT glucose (manual entry)  Anemia  Gastritis - use cox-2 inh rather than otc nsaids  Inflammatory arthritis  Meds ordered this encounter  Medications  . colchicine 0.6 MG tablet    Sig: Take 0.6 mg by mouth daily. Took it last night to see if it would help with wrist pain   . methylPREDNISolone (MEDROL, PAK,) 4 MG tablet    Sig: follow package directions    Dispense:  21 tablet    Refill:  0

## 2013-04-03 ENCOUNTER — Other Ambulatory Visit: Payer: Self-pay | Admitting: Internal Medicine

## 2013-04-14 ENCOUNTER — Other Ambulatory Visit: Payer: Self-pay | Admitting: Internal Medicine

## 2013-04-17 NOTE — Telephone Encounter (Signed)
Pt following up on request for zolpidem (AMBIEN) 10 MG tablet. Pharm: [facility]/ [place]

## 2013-04-25 ENCOUNTER — Other Ambulatory Visit: Payer: Self-pay | Admitting: *Deleted

## 2013-04-28 ENCOUNTER — Other Ambulatory Visit: Payer: Self-pay | Admitting: *Deleted

## 2013-04-28 MED ORDER — ZOLPIDEM TARTRATE 10 MG PO TABS: ORAL_TABLET | ORAL | Status: DC

## 2013-06-07 ENCOUNTER — Other Ambulatory Visit: Payer: Self-pay

## 2013-06-21 ENCOUNTER — Other Ambulatory Visit: Payer: Self-pay | Admitting: Internal Medicine

## 2013-07-01 ENCOUNTER — Other Ambulatory Visit: Payer: Self-pay | Admitting: Internal Medicine

## 2013-08-25 ENCOUNTER — Other Ambulatory Visit: Payer: Self-pay | Admitting: Internal Medicine

## 2013-09-07 ENCOUNTER — Other Ambulatory Visit: Payer: Self-pay

## 2013-10-17 ENCOUNTER — Encounter: Payer: Medicare Other | Admitting: Internal Medicine

## 2013-10-30 ENCOUNTER — Encounter: Payer: Self-pay | Admitting: Internal Medicine

## 2013-10-30 ENCOUNTER — Ambulatory Visit (INDEPENDENT_AMBULATORY_CARE_PROVIDER_SITE_OTHER): Payer: Medicare Other | Admitting: Internal Medicine

## 2013-10-30 VITALS — BP 122/72 | HR 76 | Temp 98.0°F | Ht 60.0 in | Wt 130.0 lb

## 2013-10-30 DIAGNOSIS — R7303 Prediabetes: Secondary | ICD-10-CM

## 2013-10-30 DIAGNOSIS — E039 Hypothyroidism, unspecified: Secondary | ICD-10-CM

## 2013-10-30 DIAGNOSIS — M810 Age-related osteoporosis without current pathological fracture: Secondary | ICD-10-CM

## 2013-10-30 DIAGNOSIS — I10 Essential (primary) hypertension: Secondary | ICD-10-CM

## 2013-10-30 DIAGNOSIS — R7309 Other abnormal glucose: Secondary | ICD-10-CM

## 2013-10-30 DIAGNOSIS — Z0000 Encounter for general adult medical examination without abnormal findings: Secondary | ICD-10-CM | POA: Insufficient documentation

## 2013-10-30 DIAGNOSIS — E7800 Pure hypercholesterolemia, unspecified: Secondary | ICD-10-CM

## 2013-10-30 LAB — CBC WITH DIFFERENTIAL/PLATELET
Basophils Absolute: 0 10*3/uL (ref 0.0–0.1)
Basophils Relative: 0.4 % (ref 0.0–3.0)
Eosinophils Absolute: 0.2 10*3/uL (ref 0.0–0.7)
Hemoglobin: 12.1 g/dL (ref 12.0–15.0)
Lymphocytes Relative: 18.4 % (ref 12.0–46.0)
MCHC: 33.4 g/dL (ref 30.0–36.0)
MCV: 85.6 fl (ref 78.0–100.0)
Monocytes Absolute: 0.7 10*3/uL (ref 0.1–1.0)
Neutro Abs: 8.1 10*3/uL — ABNORMAL HIGH (ref 1.4–7.7)
RDW: 15.4 % — ABNORMAL HIGH (ref 11.5–14.6)

## 2013-10-30 MED ORDER — OMEPRAZOLE 40 MG PO CPDR: 40.0000 mg | DELAYED_RELEASE_CAPSULE | Freq: Every day | ORAL | Status: DC

## 2013-10-30 MED ORDER — ZOLPIDEM TARTRATE 10 MG PO TABS: ORAL_TABLET | ORAL | Status: DC

## 2013-10-30 NOTE — Assessment & Plan Note (Signed)
Start Prolia injections every 6 months. She has history of reflux and is not a candidate for bisphosphonate therapy.

## 2013-10-30 NOTE — Progress Notes (Signed)
Pre visit review using our clinic review tool, if applicable. No additional management support is needed unless otherwise documented below in the visit note. 

## 2013-10-30 NOTE — Assessment & Plan Note (Signed)
Monitor thyroid function. Adjust Armour Thyroid dose accordingly.

## 2013-10-30 NOTE — Assessment & Plan Note (Signed)
Reviewed adult health maintenance protocols.  Reviewed adult her immunizations. She declines influenza vaccine. Medicare questionnaire reviewed in detail see attached sheet. Patient to monitor for worsening of adjustment disorder with mild depressive symptoms. Patient advised to obtain screening mammogram next month.

## 2013-10-30 NOTE — Patient Instructions (Signed)
Follow low carb diet Obtain screening mammogram next month Avoid concentrated sweets and follow low carb diet (30-35 grams per meal) We will contact you re: blood test results.

## 2013-10-30 NOTE — Progress Notes (Signed)
Subjective:    Patient ID: Julie [name], female    DOB: Nov 20, 1940, 72 y.o.   MRN: [id number]  HPI  72 year old white female with history of hypothyroidism, osteoporosis and abnormal glucose for routine Medicare physical. Medicare questionnaire reviewed in detail. Patient underwent left knee replacement in May of 2014 by Dr. Allie Bossier of [facility] orthopedics. Her last eye exam was in November of 2014 by Dr. Burgess Estelle. She does not have glaucoma and does not wear corrective lenses. She has history of cataract surgery.  Patient never smoked. She does not drink. 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 She is not sexually active. Questions regarding activities of daily living reviewed in detail. She has no history of falls. She has no issues with hearing loss. She has mild situational depression. She takes care of her sister with advanced dementia and type 2 diabetes.  She denies any problems with memory loss. She does not misplace items and she balances her on sugar count.  Osteoporosis-she has not started her Prolia injections yet.  Review of Systems  Constitutional: Negative for activity change, appetite change and unexpected weight change.  Eyes: Negative for visual disturbance.  Respiratory: Negative for cough, chest tightness and shortness of breath.   Cardiovascular: Negative for chest pain.  Genitourinary: Negative for difficulty urinating.  Neurological: Negative for headaches.  Gastrointestinal: Negative for abdominal pain, heartburn melena or hematochezia Psych: Negative for depression or anxiety Endo:  No polyuria or polydypsia,  She does not avoid sweets        Past Medical History  Diagnosis Date  . Orthopnea   . Personal history of colonic polyps   . Contact dermatitis and other eczema, due to unspecified cause   . Unspecified disorder of thyroid   . Arthropathy, unspecified, site unspecified   . Pure hypercholesterolemia   . Unspecified hypothyroidism   .  Other bursitis disorders   . PONV (postoperative nausea and vomiting)   . Unspecified essential hypertension     borderline on no meds   . Diabetes mellitus without complication     borderline on no meds   . GERD (gastroesophageal reflux disease)   . Arthritis   . Neuromuscular disorder     PVNS     History   Social History  . Marital Status: Married    Spouse Name: N/A    Number of Children: N/A  . Years of Education: N/A   Occupational History  . Not on file.   Social History Main Topics  . Smoking status: Never Smoker   . Smokeless tobacco: Never Used  . Alcohol Use: No  . Drug Use: No  . Sexual Activity: Yes   Other Topics Concern  . Not on file   Social History Narrative  . No narrative on file    Past Surgical History  Procedure Laterality Date  . Tubal ligation    . Breast surgery      breast implants, later removed  . Lumbar laminectomy    . Back surgery  2002  . Knee arthroscopy  10/21/2012    Procedure: ARTHROSCOPY KNEE;  Surgeon: Kathryne Hitch, MD;  Location: [facility];  Service: Orthopedics;  Laterality: Left;  Left Knee Arthroscopy, Debridement, partial synovectomy  . Devaited septum surgery    . Eye surgery      bilateral cataract surgery  . Blephorplasty       bilateral   . Total knee arthroplasty Left 12/30/2012    Procedure: TOTAL KNEE ARTHROPLASTY;  Surgeon: Kathryne Hitch, MD;  Location: [facility];  Service: Orthopedics;  Laterality: Left;  Left Total Knee Arthroplasty    Family History  Problem Relation Age of Onset  . Cancer Mother     breast and lung  . Stroke Father   . Heart attack Father   . Diabetes Sister   . Emphysema Maternal Aunt   . Colon cancer Neg Hx     Allergies  Allergen Reactions  . Penicillins Anaphylaxis    REACTION: causes tongue to swell  . Codeine Nausea Only  . Lisinopril     REACTION: wheezing    Current Outpatient Prescriptions on File Prior to Visit  Medication Sig Dispense Refill  .  ARMOUR THYROID 30 MG tablet take 1 tablet by mouth once daily  30 tablet  1   No current facility-administered medications on file prior to visit.    BP 122/72  Pulse 76  Temp(Src) 98 F (36.7 C) (Oral)  Ht 5' (1.524 m)  Wt 130 lb (58.968 kg)  BMI 25.39 kg/m2    Objective:   Physical Exam  Constitutional: She is oriented to person, place, and time. She appears well-developed and well-nourished. No distress.  HENT:  Head: Normocephalic and atraumatic.  Right Ear: External ear normal.  Left Ear: External ear normal.  Mouth/Throat: Oropharynx is clear and moist.  Eyes: Conjunctivae and EOM are normal. Pupils are equal, round, and reactive to light.  Neck: Neck supple.  No carotid bruit  Cardiovascular: Normal rate, regular rhythm and normal heart sounds.   No murmur heard. Pulmonary/Chest: Effort normal and breath sounds normal. She has no wheezes.  Abdominal: Soft. Bowel sounds are normal. She exhibits no distension and no mass. There is no tenderness. There is no guarding.  Musculoskeletal: Normal range of motion. She exhibits no edema.  Lymphadenopathy:    She has no cervical adenopathy.  Neurological: She is alert and oriented to person, place, and time. No cranial nerve deficit.  Skin: Skin is warm and dry.  She has irregularly shaped abrasion/laceration left anterior shin. Minimal surrounding erythema. No tenderness  Psychiatric: She has a normal mood and affect. Her behavior is normal.          Assessment & Plan:

## 2013-10-30 NOTE — Assessment & Plan Note (Signed)
Monitor A1c. Continue to avoid sweets and follow a low carbohydrate diet. If worsening A1c consider start metformin therapy.

## 2013-10-31 LAB — HEPATIC FUNCTION PANEL
ALT: 15 U/L (ref 0–35)
Albumin: 4 g/dL (ref 3.5–5.2)
Bilirubin, Direct: 0 mg/dL (ref 0.0–0.3)
Total Protein: 6.9 g/dL (ref 6.0–8.3)

## 2013-10-31 LAB — BASIC METABOLIC PANEL
CO2: 29 mEq/L (ref 19–32)
Calcium: 9.4 mg/dL (ref 8.4–10.5)
Chloride: 106 mEq/L (ref 96–112)
Creatinine, Ser: 0.6 mg/dL (ref 0.4–1.2)
Glucose, Bld: 85 mg/dL (ref 70–99)
Sodium: 141 mEq/L (ref 135–145)

## 2013-10-31 LAB — LIPID PANEL
Cholesterol: 218 mg/dL — ABNORMAL HIGH (ref 0–200)
HDL: 39.4 mg/dL (ref >39.00)
Triglycerides: 200 mg/dL — ABNORMAL HIGH (ref 0.0–149.0)

## 2013-10-31 LAB — TSH: TSH: 1.81 u[IU]/mL (ref 0.35–5.50)

## 2013-11-01 ENCOUNTER — Other Ambulatory Visit: Payer: Self-pay | Admitting: Internal Medicine

## 2013-11-03 ENCOUNTER — Other Ambulatory Visit: Payer: Self-pay | Admitting: Internal Medicine

## 2013-11-03 MED ORDER — PRAVASTATIN SODIUM 40 MG PO TABS: 40.0000 mg | ORAL_TABLET | Freq: Every day | ORAL | Status: DC

## 2014-01-01 ENCOUNTER — Other Ambulatory Visit: Payer: Self-pay | Admitting: Internal Medicine

## 2014-01-02 ENCOUNTER — Other Ambulatory Visit: Payer: Self-pay | Admitting: Internal Medicine

## 2014-02-23 ENCOUNTER — Other Ambulatory Visit: Payer: Self-pay | Admitting: Obstetrics & Gynecology

## 2014-03-01 NOTE — Patient Instructions (Addendum)
   Your procedure is scheduled on: Wednesday, May 6  Enter through the Main Entrance of [facility] at: 6 AM Pick up the phone at the desk and dial [phone] and inform us of your arrival.  Please call this number if you have any problems the morning of surgery: [phone]  Remember: Do not eat or drink after midnight: Tuesday Take these medicines the morning of surgery with a SIP OF WATER: armour, omeprazole  Do not wear jewelry, make-up, or FINGER nail polish No metal in your hair or on your body. Do not wear lotions, powders, perfumes.  You may wear deodorant.  Do not bring valuables to the hospital. Contacts, dentures or bridgework may not be worn into surgery.  Leave suitcase in the car. After Surgery it may be brought to your room. For patients being admitted to the hospital, checkout time is 11:00am the day of discharge.  Home with granddaughter Artis Delay .

## 2014-03-05 ENCOUNTER — Encounter (INDEPENDENT_AMBULATORY_CARE_PROVIDER_SITE_OTHER): Payer: Self-pay

## 2014-03-05 ENCOUNTER — Encounter (HOSPITAL_COMMUNITY): Payer: Self-pay

## 2014-03-05 ENCOUNTER — Encounter (HOSPITAL_COMMUNITY)
Admission: RE | Admit: 2014-03-05 | Discharge: 2014-03-05 | Disposition: A | Discharge status: Home / Self Care | Payer: Medicare Other | Source: Ambulatory Visit | Attending: Obstetrics & Gynecology | Admitting: Obstetrics & Gynecology

## 2014-03-05 ENCOUNTER — Ambulatory Visit (HOSPITAL_COMMUNITY): Payer: Medicare Other

## 2014-03-05 HISTORY — DX: Malignant (primary) neoplasm, unspecified: C80.1

## 2014-03-05 LAB — TYPE AND SCREEN
ABO/RH(D): A NEG
ANTIBODY SCREEN: NEGATIVE

## 2014-03-05 LAB — CBC
HEMATOCRIT: 38.7 % (ref 36.0–46.0)
HEMOGLOBIN: 12.6 g/dL (ref 12.0–15.0)
MCH: 28.4 pg (ref 26.0–34.0)
MCHC: 32.6 g/dL (ref 30.0–36.0)
MCV: 87.4 fL (ref 78.0–100.0)
Platelets: 277 10*3/uL (ref 150–400)
RBC: 4.43 MIL/uL (ref 3.87–5.11)
RDW: 14.9 % (ref 11.5–15.5)
WBC: 10.7 10*3/uL — ABNORMAL HIGH (ref 4.0–10.5)

## 2014-03-05 LAB — ABO/RH: ABO/RH(D): A NEG

## 2014-03-06 MED ORDER — GENTAMICIN SULFATE 40 MG/ML IJ SOLN
INTRAVENOUS | Status: AC
  Administered 2014-03-07: 100 mL via INTRAVENOUS
  Filled 2014-03-06: qty 7.5

## 2014-03-07 ENCOUNTER — Encounter (HOSPITAL_COMMUNITY): Payer: Medicare Other | Admitting: Anesthesiology

## 2014-03-07 ENCOUNTER — Observation Stay (HOSPITAL_COMMUNITY)
Admission: RE | Admit: 2014-03-07 | Discharge: 2014-03-08 | Disposition: A | Discharge status: Home / Self Care | Payer: Medicare Other | Source: Ambulatory Visit | Attending: Obstetrics & Gynecology | Admitting: Obstetrics & Gynecology

## 2014-03-07 ENCOUNTER — Encounter (HOSPITAL_COMMUNITY): Payer: Self-pay | Admitting: *Deleted

## 2014-03-07 ENCOUNTER — Ambulatory Visit (HOSPITAL_COMMUNITY): Payer: Medicare Other | Admitting: Anesthesiology

## 2014-03-07 ENCOUNTER — Encounter (HOSPITAL_COMMUNITY)
Admission: RE | Disposition: A | Discharge status: Home / Self Care | Payer: Self-pay | Source: Ambulatory Visit | Attending: Obstetrics & Gynecology

## 2014-03-07 DIAGNOSIS — N393 Stress incontinence (female) (male): Secondary | ICD-10-CM | POA: Insufficient documentation

## 2014-03-07 DIAGNOSIS — E039 Hypothyroidism, unspecified: Secondary | ICD-10-CM | POA: Insufficient documentation

## 2014-03-07 DIAGNOSIS — N3641 Hypermobility of urethra: Secondary | ICD-10-CM | POA: Insufficient documentation

## 2014-03-07 DIAGNOSIS — Z9889 Other specified postprocedural states: Secondary | ICD-10-CM

## 2014-03-07 DIAGNOSIS — N840 Polyp of corpus uteri: Secondary | ICD-10-CM | POA: Insufficient documentation

## 2014-03-07 DIAGNOSIS — D259 Leiomyoma of uterus, unspecified: Secondary | ICD-10-CM | POA: Insufficient documentation

## 2014-03-07 DIAGNOSIS — N859 Noninflammatory disorder of uterus, unspecified: Secondary | ICD-10-CM | POA: Insufficient documentation

## 2014-03-07 DIAGNOSIS — N813 Complete uterovaginal prolapse: Principal | ICD-10-CM | POA: Insufficient documentation

## 2014-03-07 DIAGNOSIS — K219 Gastro-esophageal reflux disease without esophagitis: Secondary | ICD-10-CM | POA: Insufficient documentation

## 2014-03-07 DIAGNOSIS — I10 Essential (primary) hypertension: Secondary | ICD-10-CM | POA: Insufficient documentation

## 2014-03-07 DIAGNOSIS — E119 Type 2 diabetes mellitus without complications: Secondary | ICD-10-CM | POA: Insufficient documentation

## 2014-03-07 HISTORY — PX: CYSTOCELE REPAIR: SHX163

## 2014-03-07 HISTORY — PX: ROBOTIC ASSISTED LAPAROSCOPIC SACROCOLPOPEXY: SHX5388

## 2014-03-07 HISTORY — PX: ROBOTIC ASSISTED SUPRACERVICAL HYSTERECTOMY WITH BILATERAL SALPINGO OOPHERECTOMY: SHX6084

## 2014-03-07 HISTORY — PX: BLADDER SUSPENSION: SHX72

## 2014-03-07 SURGERY — ROBOTIC ASSISTED SUPRACERVICAL HYSTERECTOMY WITH BILATERAL SALPINGO OOPHORECTOMY
Anesthesia: General | Site: Vagina

## 2014-03-07 MED ORDER — VASOPRESSIN 20 UNIT/ML IJ SOLN
INTRAMUSCULAR | Status: AC
  Filled 2014-03-07: qty 1

## 2014-03-07 MED ORDER — THYROID 30 MG PO TABS
30.0000 mg | ORAL_TABLET | Freq: Every day | ORAL | Status: DC
  Filled 2014-03-07 (×2): qty 1

## 2014-03-07 MED ORDER — BUPIVACAINE HCL (PF) 0.25 % IJ SOLN
INTRAMUSCULAR | Status: DC | PRN
Start: 2014-03-07 — End: 2014-03-07
  Administered 2014-03-07: 19 mL

## 2014-03-07 MED ORDER — PHENYLEPHRINE HCL 10 MG/ML IJ SOLN
INTRAMUSCULAR | Status: DC | PRN
  Administered 2014-03-07: 40 ug via INTRAVENOUS
  Administered 2014-03-07 (×2): 80 ug via INTRAVENOUS
  Administered 2014-03-07: 40 ug via INTRAVENOUS
  Administered 2014-03-07: 80 ug via INTRAVENOUS

## 2014-03-07 MED ORDER — METHYLENE BLUE 1 % INJ SOLN
INTRAMUSCULAR | Status: AC
  Filled 2014-03-07: qty 10

## 2014-03-07 MED ORDER — METHYLENE BLUE 1 % INJ SOLN
INTRAMUSCULAR | Status: DC | PRN
  Administered 2014-03-07: 50 mg via INTRAVENOUS

## 2014-03-07 MED ORDER — PHENYLEPHRINE 40 MCG/ML (10ML) SYRINGE FOR IV PUSH (FOR BLOOD PRESSURE SUPPORT)
PREFILLED_SYRINGE | INTRAVENOUS | Status: AC
  Filled 2014-03-07: qty 5

## 2014-03-07 MED ORDER — METHYLENE BLUE 1 % INJ SOLN
INTRAMUSCULAR | Status: DC | PRN
  Administered 2014-03-07: 10 mL via SUBMUCOSAL

## 2014-03-07 MED ORDER — FENTANYL CITRATE 0.05 MG/ML IJ SOLN
INTRAMUSCULAR | Status: DC | PRN
  Administered 2014-03-07: 100 ug via INTRAVENOUS
  Administered 2014-03-07 (×3): 50 ug via INTRAVENOUS

## 2014-03-07 MED ORDER — LACTATED RINGERS IV SOLN
INTRAVENOUS | Status: DC
  Administered 2014-03-07 (×4): via INTRAVENOUS

## 2014-03-07 MED ORDER — METOCLOPRAMIDE HCL 5 MG/ML IJ SOLN
INTRAMUSCULAR | Status: AC
  Administered 2014-03-07: 10 mg via INTRAVENOUS
  Filled 2014-03-07: qty 2

## 2014-03-07 MED ORDER — SODIUM CHLORIDE 0.9 % IJ SOLN
INTRAMUSCULAR | Status: AC
  Filled 2014-03-07: qty 100

## 2014-03-07 MED ORDER — LIDOCAINE-EPINEPHRINE 1 %-1:100000 IJ SOLN
INTRAMUSCULAR | Status: AC
  Filled 2014-03-07: qty 1

## 2014-03-07 MED ORDER — SCOPOLAMINE 1 MG/3DAYS TD PT72
MEDICATED_PATCH | TRANSDERMAL | Status: AC
  Filled 2014-03-07: qty 1

## 2014-03-07 MED ORDER — ACETAMINOPHEN 325 MG PO TABS: 325.0000 mg | ORAL_TABLET | ORAL | Status: DC | PRN

## 2014-03-07 MED ORDER — ZOLPIDEM TARTRATE 5 MG PO TABS: 5.0000 mg | ORAL_TABLET | Freq: Every evening | ORAL | Status: DC | PRN

## 2014-03-07 MED ORDER — LACTATED RINGERS IV SOLN
INTRAVENOUS | Status: DC
  Administered 2014-03-07 – 2014-03-08 (×2): via INTRAVENOUS

## 2014-03-07 MED ORDER — OXYCODONE-ACETAMINOPHEN 5-325 MG PO TABS: 1.0000 | ORAL_TABLET | ORAL | Status: DC | PRN

## 2014-03-07 MED ORDER — PROPOFOL 10 MG/ML IV BOLUS
INTRAVENOUS | Status: DC | PRN
  Administered 2014-03-07: 150 mg via INTRAVENOUS

## 2014-03-07 MED ORDER — LIDOCAINE HCL 1 % IJ SOLN
INTRAMUSCULAR | Status: AC
  Filled 2014-03-07: qty 20

## 2014-03-07 MED ORDER — ESTRADIOL 0.1 MG/GM VA CREA
TOPICAL_CREAM | VAGINAL | Status: DC | PRN
Start: 2014-03-07 — End: 2014-03-07
  Administered 2014-03-07: 1 via VAGINAL

## 2014-03-07 MED ORDER — DEXAMETHASONE SODIUM PHOSPHATE 4 MG/ML IJ SOLN
INTRAMUSCULAR | Status: DC | PRN
  Administered 2014-03-07: 10 mg via INTRAVENOUS

## 2014-03-07 MED ORDER — LIDOCAINE HCL 1 % IJ SOLN
INTRAMUSCULAR | Status: DC | PRN
  Administered 2014-03-07: 20 mL

## 2014-03-07 MED ORDER — DEXAMETHASONE SODIUM PHOSPHATE 10 MG/ML IJ SOLN
INTRAMUSCULAR | Status: AC
  Filled 2014-03-07: qty 1

## 2014-03-07 MED ORDER — NEOSTIGMINE METHYLSULFATE 10 MG/10ML IV SOLN
INTRAVENOUS | Status: DC | PRN
  Administered 2014-03-07: 4 mg via INTRAVENOUS

## 2014-03-07 MED ORDER — ROCURONIUM BROMIDE 100 MG/10ML IV SOLN
INTRAVENOUS | Status: DC | PRN
  Administered 2014-03-07: 50 mg via INTRAVENOUS
  Administered 2014-03-07 (×3): 10 mg via INTRAVENOUS

## 2014-03-07 MED ORDER — ONDANSETRON HCL 4 MG/2ML IJ SOLN
INTRAMUSCULAR | Status: DC | PRN
  Administered 2014-03-07: 4 mg via INTRAVENOUS

## 2014-03-07 MED ORDER — BUPIVACAINE-EPINEPHRINE (PF) 0.5% -1:200000 IJ SOLN
INTRAMUSCULAR | Status: AC
  Filled 2014-03-07: qty 30

## 2014-03-07 MED ORDER — SODIUM CHLORIDE 0.9 % IJ SOLN
INTRAMUSCULAR | Status: AC
  Filled 2014-03-07: qty 10

## 2014-03-07 MED ORDER — LIDOCAINE HCL (CARDIAC) 20 MG/ML IV SOLN
INTRAVENOUS | Status: DC | PRN
  Administered 2014-03-07: 50 mg via INTRAVENOUS

## 2014-03-07 MED ORDER — SODIUM CHLORIDE 0.9 % IJ SOLN
INTRAMUSCULAR | Status: DC | PRN
  Administered 2014-03-07: 50 mL via INTRAVENOUS

## 2014-03-07 MED ORDER — LIDOCAINE HCL (CARDIAC) 20 MG/ML IV SOLN
INTRAVENOUS | Status: AC
  Filled 2014-03-07: qty 5

## 2014-03-07 MED ORDER — PANTOPRAZOLE SODIUM 40 MG PO TBEC
80.0000 mg | DELAYED_RELEASE_TABLET | Freq: Every day | ORAL | Status: DC
  Filled 2014-03-07 (×2): qty 2

## 2014-03-07 MED ORDER — METOCLOPRAMIDE HCL 5 MG/ML IJ SOLN
10.0000 mg | Freq: Once | INTRAMUSCULAR | Status: AC | PRN
  Administered 2014-03-07: 10 mg via INTRAVENOUS

## 2014-03-07 MED ORDER — LIDOCAINE-EPINEPHRINE 1 %-1:100000 IJ SOLN
INTRAMUSCULAR | Status: DC | PRN
  Administered 2014-03-07: 10 mL

## 2014-03-07 MED ORDER — STERILE WATER FOR IRRIGATION IR SOLN
Status: DC | PRN
Start: 2014-03-07 — End: 2014-03-07
  Administered 2014-03-07: 1000 mL via INTRAVESICAL

## 2014-03-07 MED ORDER — FENTANYL CITRATE 0.05 MG/ML IJ SOLN
INTRAMUSCULAR | Status: AC
  Administered 2014-03-07: 25 ug via INTRAVENOUS
  Filled 2014-03-07: qty 2

## 2014-03-07 MED ORDER — ROCURONIUM BROMIDE 100 MG/10ML IV SOLN
INTRAVENOUS | Status: AC
  Filled 2014-03-07: qty 1

## 2014-03-07 MED ORDER — GLYCOPYRROLATE 0.2 MG/ML IJ SOLN
INTRAMUSCULAR | Status: DC | PRN
  Administered 2014-03-07: .8 mg via INTRAVENOUS

## 2014-03-07 MED ORDER — BUPIVACAINE HCL (PF) 0.25 % IJ SOLN
INTRAMUSCULAR | Status: AC
  Filled 2014-03-07: qty 30

## 2014-03-07 MED ORDER — IBUPROFEN 600 MG PO TABS
600.0000 mg | ORAL_TABLET | Freq: Four times a day (QID) | ORAL | Status: DC | PRN
  Administered 2014-03-07 – 2014-03-08 (×3): 600 mg via ORAL
  Filled 2014-03-07 (×3): qty 1

## 2014-03-07 MED ORDER — PROPOFOL 10 MG/ML IV EMUL
INTRAVENOUS | Status: AC
  Filled 2014-03-07: qty 20

## 2014-03-07 MED ORDER — FENTANYL CITRATE 0.05 MG/ML IJ SOLN
INTRAMUSCULAR | Status: AC
  Filled 2014-03-07: qty 5

## 2014-03-07 MED ORDER — ONDANSETRON HCL 4 MG/2ML IJ SOLN
INTRAMUSCULAR | Status: AC
  Filled 2014-03-07: qty 2

## 2014-03-07 MED ORDER — ESTRADIOL 0.1 MG/GM VA CREA
TOPICAL_CREAM | VAGINAL | Status: AC
Start: 2014-03-07 — End: 2014-03-07
  Filled 2014-03-07: qty 42.5

## 2014-03-07 MED ORDER — ACETAMINOPHEN 160 MG/5ML PO SOLN: 325.0000 mg | ORAL | Status: DC | PRN

## 2014-03-07 MED ORDER — HYDROMORPHONE HCL PF 1 MG/ML IJ SOLN: 1.0000 mg | INTRAMUSCULAR | Status: DC | PRN

## 2014-03-07 MED ORDER — ACETAMINOPHEN 10 MG/ML IV SOLN
1000.0000 mg | Freq: Once | INTRAVENOUS | Status: AC
  Administered 2014-03-07: 1000 mg via INTRAVENOUS
  Filled 2014-03-07: qty 100

## 2014-03-07 MED ORDER — FENTANYL CITRATE 0.05 MG/ML IJ SOLN
25.0000 ug | INTRAMUSCULAR | Status: DC | PRN
Start: 2014-03-07 — End: 2014-03-07
  Administered 2014-03-07 (×2): 25 ug via INTRAVENOUS

## 2014-03-07 MED ORDER — NEOSTIGMINE METHYLSULFATE 10 MG/10ML IV SOLN
INTRAVENOUS | Status: AC
  Filled 2014-03-07: qty 1

## 2014-03-07 MED ORDER — MIDAZOLAM HCL 5 MG/5ML IJ SOLN
INTRAMUSCULAR | Status: DC | PRN
  Administered 2014-03-07: 2 mg via INTRAVENOUS

## 2014-03-07 MED ORDER — SCOPOLAMINE 1 MG/3DAYS TD PT72: 1.0000 | MEDICATED_PATCH | TRANSDERMAL | Status: DC

## 2014-03-07 MED ORDER — GLYCOPYRROLATE 0.2 MG/ML IJ SOLN
INTRAMUSCULAR | Status: AC
  Filled 2014-03-07: qty 4

## 2014-03-07 MED ORDER — MIDAZOLAM HCL 2 MG/2ML IJ SOLN
INTRAMUSCULAR | Status: AC
  Filled 2014-03-07: qty 2

## 2014-03-07 SURGICAL SUPPLY — 99 items
BAG URINE DRAINAGE (UROLOGICAL SUPPLIES) ×4 IMPLANT
BARRIER ADHS 3X4 INTERCEED (GAUZE/BANDAGES/DRESSINGS) IMPLANT
BLADE 15 SAFETY STRL DISP (BLADE) ×8 IMPLANT
CABLE HIGH FREQUENCY MONO STRZ (ELECTRODE) ×4 IMPLANT
CANISTER SUCT 3000ML (MISCELLANEOUS) ×4 IMPLANT
CATH FOLEY 2WAY SLVR  5CC 18FR (CATHETERS) ×1
CATH FOLEY 2WAY SLVR 5CC 18FR (CATHETERS) ×3 IMPLANT
CATH FOLEY 3WAY  5CC 16FR (CATHETERS) ×1
CATH FOLEY 3WAY 30CC 18FR (CATHETERS) ×4 IMPLANT
CATH FOLEY 3WAY 5CC 16FR (CATHETERS) ×3 IMPLANT
CHLORAPREP W/TINT 26ML (MISCELLANEOUS) ×4 IMPLANT
CLOTH BEACON ORANGE TIMEOUT ST (SAFETY) ×4 IMPLANT
CONT PATH 16OZ SNAP LID 3702 (MISCELLANEOUS) ×4 IMPLANT
COVER MAYO STAND STRL (DRAPES) ×4 IMPLANT
COVER TABLE BACK 60X90 (DRAPES) ×8 IMPLANT
COVER TIP SHEARS 8 DVNC (MISCELLANEOUS) ×3 IMPLANT
COVER TIP SHEARS 8MM DA VINCI (MISCELLANEOUS) ×1
DECANTER SPIKE VIAL GLASS SM (MISCELLANEOUS) ×16 IMPLANT
DERMABOND ADVANCED (GAUZE/BANDAGES/DRESSINGS) ×2
DERMABOND ADVANCED .7 DNX12 (GAUZE/BANDAGES/DRESSINGS) ×6 IMPLANT
DEVICE CAPIO SLIM SINGLE (INSTRUMENTS) IMPLANT
DRAPE HUG U DISPOSABLE (DRAPE) ×4 IMPLANT
DRAPE HYSTEROSCOPY (DRAPE) ×4 IMPLANT
DRAPE LG THREE QUARTER DISP (DRAPES) ×8 IMPLANT
DRAPE WARM FLUID 44X44 (DRAPE) ×4 IMPLANT
ELECT REM PT RETURN 9FT ADLT (ELECTROSURGICAL) ×4
ELECTRODE REM PT RTRN 9FT ADLT (ELECTROSURGICAL) ×3 IMPLANT
EVACUATOR SMOKE 8.L (FILTER) ×4 IMPLANT
GAUZE PACKING IODOFORM 2 (PACKING) IMPLANT
GAUZE VASELINE 3X9 (GAUZE/BANDAGES/DRESSINGS) IMPLANT
GLOVE BIO SURGEON STRL SZ 6.5 (GLOVE) ×16 IMPLANT
GLOVE BIO SURGEON STRL SZ7 (GLOVE) ×24 IMPLANT
GLOVE BIOGEL PI IND STRL 6.5 (GLOVE) ×12 IMPLANT
GLOVE BIOGEL PI IND STRL 7.0 (GLOVE) ×3 IMPLANT
GLOVE BIOGEL PI INDICATOR 6.5 (GLOVE) ×4
GLOVE BIOGEL PI INDICATOR 7.0 (GLOVE) ×1
GOWN STRL REUS W/TWL LRG LVL3 (GOWN DISPOSABLE) ×24 IMPLANT
GRAFT Y-MESH ALYTE (Mesh General) ×4 IMPLANT
IV STOPCOCK 4 WAY 40  W/Y SET (IV SOLUTION) ×1
IV STOPCOCK 4 WAY 40 W/Y SET (IV SOLUTION) ×3 IMPLANT
KIT ACCESSORY DA VINCI DISP (KITS) ×1
KIT ACCESSORY DVNC DISP (KITS) ×3 IMPLANT
LEGGING LITHOTOMY PAIR STRL (DRAPES) ×4 IMPLANT
NEEDLE HYPO 22GX1.5 SAFETY (NEEDLE) ×4 IMPLANT
NEEDLE SPNL 20GX3.5 QUINCKE YW (NEEDLE) ×4 IMPLANT
NEEDLE SPNL 22GX3.5 QUINCKE BK (NEEDLE) IMPLANT
NS IRRIG 1000ML POUR BTL (IV SOLUTION) ×4 IMPLANT
OCCLUDER COLPOPNEUMO (BALLOONS) IMPLANT
PACK LAVH (CUSTOM PROCEDURE TRAY) ×4 IMPLANT
PACK VAGINAL WOMENS (CUSTOM PROCEDURE TRAY) ×4 IMPLANT
PAD PREP 24X48 CUFFED NSTRL (MISCELLANEOUS) ×8 IMPLANT
PLUG CATH AND CAP STER (CATHETERS) ×4 IMPLANT
POUCH SPECIMEN RETRIEVAL 10MM (ENDOMECHANICALS) ×4 IMPLANT
PROTECTOR NERVE ULNAR (MISCELLANEOUS) ×8 IMPLANT
SET CYSTO W/LG BORE CLAMP LF (SET/KITS/TRAYS/PACK) ×8 IMPLANT
SET IRRIG TUBING LAPAROSCOPIC (IRRIGATION / IRRIGATOR) ×4 IMPLANT
SLING TVT EXACT (Sling) ×4 IMPLANT
SOLUTION ELECTROLUBE (MISCELLANEOUS) ×4 IMPLANT
SUT CAPIO ETHIBPND (SUTURE) IMPLANT
SUT GORETEX NAB #0 THX26 36IN (SUTURE) ×28 IMPLANT
SUT VIC AB 0 CT1 27 (SUTURE)
SUT VIC AB 0 CT1 27XBRD ANBCTR (SUTURE) IMPLANT
SUT VIC AB 0 CT1 27XBRD ANTBC (SUTURE) IMPLANT
SUT VIC AB 0 CT2 27 (SUTURE) IMPLANT
SUT VIC AB 2-0 CT1 27 (SUTURE) ×2
SUT VIC AB 2-0 CT1 TAPERPNT 27 (SUTURE) ×6 IMPLANT
SUT VIC AB 2-0 CT2 27 (SUTURE) ×8 IMPLANT
SUT VIC AB 2-0 SH 27 (SUTURE) ×2
SUT VIC AB 2-0 SH 27XBRD (SUTURE) ×6 IMPLANT
SUT VIC AB 2-0 UR5 27 (SUTURE) ×16 IMPLANT
SUT VIC AB 3-0 SH 27 (SUTURE)
SUT VIC AB 3-0 SH 27X BRD (SUTURE) IMPLANT
SUT VIC AB 4-0 PS2 27 (SUTURE) ×8 IMPLANT
SUT VICRYL 0 27 CT2 27 ABS (SUTURE) IMPLANT
SUT VICRYL 0 UR6 27IN ABS (SUTURE) ×8 IMPLANT
SUT VICRYL 2 0 18  UND BR (SUTURE)
SUT VICRYL 2 0 18 UND BR (SUTURE) IMPLANT
SUT VLOC 180 0 9IN  GS21 (SUTURE)
SUT VLOC 180 0 9IN GS21 (SUTURE) IMPLANT
SUT VLOC 180 2-0 9IN GS21 (SUTURE) ×4 IMPLANT
SYR 50ML LL SCALE MARK (SYRINGE) ×4 IMPLANT
SYRINGE 10CC LL (SYRINGE) IMPLANT
SYSTEM CONVERTIBLE TROCAR (TROCAR) ×4 IMPLANT
TIP RUMI ORANGE 6.7MMX12CM (TIP) IMPLANT
TIP UTERINE 5.1X6CM LAV DISP (MISCELLANEOUS) ×8 IMPLANT
TIP UTERINE 6.7X10CM GRN DISP (MISCELLANEOUS) IMPLANT
TIP UTERINE 6.7X6CM WHT DISP (MISCELLANEOUS) IMPLANT
TIP UTERINE 6.7X8CM BLUE DISP (MISCELLANEOUS) IMPLANT
TOWEL OR 17X24 6PK STRL BLUE (TOWEL DISPOSABLE) ×12 IMPLANT
TRAY FOLEY BAG SILVER LF 16FR (CATHETERS) IMPLANT
TRAY FOLEY CATH 14FR (SET/KITS/TRAYS/PACK) IMPLANT
TROCAR 12M 150ML BLUNT (TROCAR) ×4 IMPLANT
TROCAR DISP BLADELESS 8 DVNC (TROCAR) ×3 IMPLANT
TROCAR DISP BLADELESS 8MM (TROCAR) ×1
TROCAR XCEL 12X100 BLDLESS (ENDOMECHANICALS) ×4 IMPLANT
TROCAR XCEL NON-BLD 5MMX100MML (ENDOMECHANICALS) ×4 IMPLANT
TUBING FILTER THERMOFLATOR (ELECTROSURGICAL) ×8 IMPLANT
WARMER LAPAROSCOPE (MISCELLANEOUS) ×4 IMPLANT
WATER STERILE IRR 1000ML POUR (IV SOLUTION) ×12 IMPLANT

## 2014-03-07 NOTE — Op Note (Signed)
03/07/2014  12:29 PM  PATIENT:  Julie [name]  73 y.o. female  PRE-OPERATIVE DIAGNOSIS:  Complete Uterine Prolapse, Eversion, Stress urinary incontinence  POST-OPERATIVE DIAGNOSIS:  Complete Uterine Prolapse, Eversion, Stress urinary incontinence  PROCEDURE:  Procedure(s): ROBOTIC ASSISTED SUPRACERVICAL HYSTERECTOMY WITH BILATERAL SALPINGO OOPHORECTOMY ROBOTIC ASSISTED LAPAROSCOPIC SACROCOLPOPEXY With Y-Mesh ANTERIOR REPAIR (CYSTOCELE) TRANSVAGINAL TAPE (TVT) PROCEDURE WITH CYSTOSCOPY  SURGEON:  Surgeon(s): Kelly A. Pamala Hurry, MD Princess Bruins, MD  ASSISTANTS: Dr Floyce Stakes. Fogleman   ANESTHESIA:   general  PROCEDURE:  Under general anesthesia with endotracheal intubation the patient is in lithotomy position. She is prepped with ChloraPrep on the abdomen and with Betadine on the suprapubic, vulvar and vaginal areas. The patient is draped as usual. A timeout is done.  The vaginal exam reveals a completely everted prolapse uterus.  No adnexal mass. The bladder is catheterized and the Foley is left in place.  The weighted speculum is inserted in the vagina and the anterior lip of the cervix is grasped with a tenaculum. The hysterometry is 7 cm.  We are put in place a 6 roomy with a small Co-ring. The other instruments are removed.  Abdominally we infiltrate the subcutaneous tissue with Marcaine one quarter plain at the supraumbilical area.  We make a 1.5 cm incision with a scalpel at that level.  The aponeurosis is opened with the Mayo scissors under direct vision. The peritoneum is opened bluntly with a finger. We put a pursestring stitch of Vicryl at the aponeurosis.  The Sheryle Hail is inserted under direct vision. A pneumoperitoneum is created with CO2. The camera is inserted at that port.  Inspection of the abdominopelvic cavity reveals no adhesions.  The uterus is normal except for a small sub-serosal myoma at the posterior fundus.  Both tubes and ovaries are normal to inspection.  We used a  semicircular configuration for the port placement.  2 robotic ports on the left and one robotic port on the lower right with the 5 mm assistant port on the upper right. The robot his docked on the left side. The instruments are inserted under direct vision with the Endo Shears scissor in the first arm, the PK in the second arm and the fenestrated clamp in the third arm. We go to the console.  Both ureters are normal anatomic position. We starts on the left side with cauterization and section of the left round ligament. We then cauterized and sectioned the left infundibulopelvic ligament. We cauterized and sectioned just below the left ovary.  We then opened the anterior peritoneum with the Endo Shears scissors. We starts lowering the bladder passed the co-ring.  We proceeded exactly the same way on the right side. The bladder was further descended well passed the Co-ring.  We also descended the peritoneum at the posterior aspect of the uterus.  We then cauterized the left uterine artery at the junction between the uterus and the cervix.  We cauterized the right uterine artery at the same level. We sectioned both sides.  We then used the Endo Shears to section just below the uterus at the junction with the upper aspect of the cervix.  The uterus was completely detached with both ovaries and both tubes.  It was put in the posterior cul-de-sac.  We completed hemostasis on the cervix.  We cauterized the endocervical canal.  The bladder was filled to make sure we visualized the area where it was descended to.  We then cut the Y. Mesh at 5 cm anteriorly and  posteriorly.  The instruments were switched to the cutting needle driver in the first arm, the mega needle driver in the second arm and the PK in the third arm.  The mesh was inserted in the abdominal cavity.  We sutured the mesh to the anterior vagina, the cervix and the posterior vagina.  We used a rectal probe in the vagina to expose the field.  We used Gore-Tex 0  to  secure the mesh with separate stitches.  A total of 15 stitches were done anteriorly and posteriorly.  We then opened the peritoneum above the promontory of the sacrum.  We continued opening the peritoneum all the way to the cervix.  We cleared the promontory of the sacrum to visualize the anterior ligament.  2 solid stitches of Gore-Tex 0 were applied at that level to secure the mesh.  We then used a V. LOC 2-0 in a running stitch to close the peritoneum above the mesh.  The mesh was completely covered with the peritoneum.  Hemostasis was adequate at all levels.  We removed the robotic instruments.  We went to laparoscopy.  All 8 needles were removed from the abdomen.  We then put the uterus with both tubes and ovaries an Endobag.  The specimen was removed at the supraumbilical incision.  It was sent to pathology.  We then went back to laparoscopy and confirmed good hemostasis.  All laparoscopy instruments were removed.  The CO2 was evacuated.  The supraumbilical incision was closed by attaching the pursestring stitch at the aponeurosis.  All incisions were closed with a subcuticular stitch of Vicryl 3-0.  Dermabond was added on all incisions.  Hemostasis was adequate on all incisions.        We then proceeded with the anterior repair.  Allises were applied at the vaginal mucosa at the apex of the cystocele.  The mucosa was infiltrated with lidocaine with epinephrine.  We used the knife to make a small incision at that level. We then used then Metzenbaums scissors to cut just under the mucosa and sectioned to about 2 cm of the urethral meatus.  We then dissected the cystocele from the mucosa, using the knife, the Metzenbaums scissors and a sponge on the finger.  We then sectioned the excess mucosa.  We reduce the cystocele with a running suture of Vicryl 2-0.  We closed the mucosa with a running suture of Vicryl 2-0.  Hemostasis was adequate at all levels.  A packing with Estrace will be put in the vagina.       At this point Dr. Aloha Gell took over to proceed with the Sling procedure and cystoscopy.  She will dictate that part.      After the sling procedure and cystoscopy, the patient was brought to recovery room in good and stable status.  ESTIMATED BLOOD LOSS: 50 cc   Intake/Output Summary (Last 24 hours) at 03/07/14 1229 Last data filed at 03/07/14 1215  Gross per 24 hour  Intake   1000 ml  Output    400 ml  Net    600 ml     BLOOD ADMINISTERED:none   LOCAL MEDICATIONS USED:  MARCAINE     SPECIMEN:  Source of Specimen:  Uterus without cervix, both tubes and ovaries  DISPOSITION OF SPECIMEN:  PATHOLOGY  COUNTS:  YES  PLAN OF CARE: Transfer to PACU  Princess Bruins MD  03/07/2014 at 12:29

## 2014-03-07 NOTE — H&P (Signed)
Julie [name] is an 73 y.o. female G1P1  RP:  Complete uterine prolapse with eversion, SUI for Robotic supracervical hysterectomy with BSO, Sacrocolpopexy, anterior repair, Sling procedure  Pertinent Gynecological History: Menses: post-menopausal Contraception: post menopausal status Blood transfusions: none Sexually transmitted diseases: no past history Previous GYN Procedures: None  Last mammogram: normal Last pap: normal  OB History: G1P1   Menstrual History:  No LMP recorded. Patient is postmenopausal.    Past Medical History  Diagnosis Date  . Personal history of colonic polyps   . Contact dermatitis and other eczema, due to unspecified cause     History  . Unspecified disorder of thyroid   . Arthropathy, unspecified, site unspecified   . Pure hypercholesterolemia   . Unspecified hypothyroidism   . Other bursitis disorders     history  . PONV (postoperative nausea and vomiting)   . SVD (spontaneous vaginal delivery)     x 1  . GERD (gastroesophageal reflux disease)     history - no current prob - no med  . 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 Arthritis     lower back shoulder  . 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 Tubal ligation    . Lumbar laminectomy    . Knee arthroscopy  10/21/2012    right - Procedure: ARTHROSCOPY KNEE;  Surgeon: Mcarthur Rossetti, MD;  Location: [facility];  Service: Orthopedics;  Laterality: Left;  Left Knee Arthroscopy, Debridement, partial synovectomy  . Devaited septum surgery    . Eye surgery      bilateral cataract surgery  . Blephorplasty       bilateral   . 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 Back surgery  2002    L4/L5  . Breast surgery      x  2 breast implants, later removed  . Wisdom tooth extraction    . Colonoscopy      Family History  Problem Relation Age of Onset  . Cancer Mother     breast and lung  . Stroke Father   . Heart attack Father   . Diabetes Sister   . Emphysema Maternal Aunt   . Colon cancer Neg Hx     Social History:  reports that she has never smoked. She has never used smokeless tobacco. She reports that she drinks alcohol. She reports that she does not use illicit drugs.  Allergies:  Allergies  Allergen Reactions  . Penicillins Anaphylaxis    REACTION: causes tongue to swell  . Codeine Nausea Only  . Lisinopril     REACTION: wheezing    Prescriptions prior to admission  Medication Sig Dispense Refill  . ARMOUR THYROID 30 MG tablet take 1 tablet by mouth once daily  30 tablet  1  . omeprazole (PRILOSEC) 40 MG capsule Take 1 capsule (40 mg total) by mouth daily.  90 capsule  1  . zolpidem (AMBIEN) 10 MG tablet take 1 tablet by mouth at bedtime if needed  30 tablet  5    ROS  Blood pressure 153/69, pulse 79, temperature 98.4 F (36.9 C), temperature source Oral, resp. rate 16, SpO2 96.00%. Physical Exam  No results  found for this or any previous visit (from the past 24 hour(s)).  No results found.  Assessment/Plan: Complete uterine prolapse with eversion for Robotic supracervical hysterectomy with BSO, Sacrocolpopexy, anterior repair, Sling procedure.  Surgery and risks reviewed thoroughly at office visit.  Patient has no further question this morning.  Marie-Lyne Idelia Caudell 03/07/2014, 7:21 AM

## 2014-03-07 NOTE — H&P (Signed)
See scanned H&P for full details.  In short, 73 yo pt w/ complete prolapse planning robotic assisted sacrocolpopexy by Dr. Dellis Filbert who I am planning a prophylactic TVT sling. Pt w/ h/o stress incontinence, though this has not been a problem in recent years due to her significant prolpase.  Pt has been using vaginal estrogen cream and a pessary for the last month.  Past Medical History  Diagnosis Date  . Personal history of colonic polyps   . Contact dermatitis and other eczema, due to unspecified cause     History  . Unspecified disorder of thyroid   . Arthropathy, unspecified, site unspecified   . Pure hypercholesterolemia   . Unspecified hypothyroidism   . Other bursitis disorders     history  . PONV (postoperative nausea and vomiting)   . SVD (spontaneous vaginal delivery)     x 1  . GERD (gastroesophageal reflux disease)     history - no current prob - no med  . 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 Arthritis     lower back shoulder  . 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 Tubal ligation    . Lumbar laminectomy    . Knee arthroscopy  10/21/2012    right - Procedure: ARTHROSCOPY KNEE;  Surgeon: Mcarthur Rossetti, MD;  Location: [facility];  Service: Orthopedics;  Laterality: Left;  Left Knee Arthroscopy, Debridement, partial synovectomy  . Devaited septum surgery    . Eye surgery      bilateral cataract surgery  . Blephorplasty       bilateral   . 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 Back surgery  2002    L4/L5  . Breast surgery      x 2 breast implants, later removed  . Wisdom tooth extraction    . Colonoscopy      PE:  Filed Vitals:   03/07/14 0610  BP: 153/69  Pulse: 79  Temp: 98.4 F (36.9  C)  Resp: 16   Deferred to OR.  A/P: SUrgery by Dr. Dellis Filbert, prophylactic sling by me. Pt aware risks of sling erosion, urinary retention, need for post-op catheter, immediate surgical complications. Pt agrees w/ prophylactic TVT sling.  Whitney Hillegass A. Yoanna Jurczyk 03/07/2014 7:10 AM '

## 2014-03-07 NOTE — Anesthesia Postprocedure Evaluation (Signed)
  Anesthesia Post-op Note  Patient: Julie [name]  Procedure(s) Performed: Procedure(s) with comments: ROBOTIC ASSISTED SUPRACERVICAL HYSTERECTOMY WITH BILATERAL SALPINGO OOPHORECTOMY (N/A) - 4 hrs. ROBOTIC ASSISTED LAPAROSCOPIC SACROCOLPOPEXY With Carrolyn Meiers (N/A) ANTERIOR REPAIR (CYSTOCELE) (N/A) TRANSVAGINAL TAPE (TVT) PROCEDURE WITH CYSTO (N/A) - vaginal   Patient Location: PACU  Anesthesia Type:General  Level of Consciousness: awake, alert  and oriented  Airway and Oxygen Therapy: Patient Spontanous Breathing  Post-op Pain: mild  Post-op Assessment: Post-op Vital signs reviewed, Patient's Cardiovascular Status Stable, Respiratory Function Stable, Patent Airway, No signs of Nausea or vomiting and Pain level controlled  Post-op Vital Signs: Reviewed and stable  Last Vitals:  Filed Vitals:   03/07/14 1330  BP: 121/55  Pulse: 81  Temp:   Resp: 18    Complications: No apparent anesthesia complications

## 2014-03-07 NOTE — Op Note (Signed)
Preoperative diagnosis: Stress urinary incontinence, urethral hypermobility  Postoperative diagnosis: stress urinary incontinence, urethral hypermobility Procedure tension-free vaginal tape sling placement, retropubic placement, cystoscopy Surgeon: Pamala Hurry Assistant none Anesthesia Gen. endotracheal anesthesia Antibiotics Gent/ Clinda Complications: None Estimated blood loss: 10 cc Findings: Hypermobile urethra,  atrophic vaginal mucosa, normal bladder and urethra, no sling material seen in bladder or urethra post procedure, normal peristalsing ureters post-procedure.   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 Given the high liklihood of post-op stress incontinence, pt is having a prophylactic TVT sling.  Patient has been counseled on the risk and benefits of this procedure including the risk of urinary retention (and probably needing to go home with a catheter), risk of sling erosion, immediate surgical and anaesthesia risks,  and possibly worsening or causing de-novo urge incontinence. As stress incontinence is her most likley outome after sacrocolpopext,  patient consented to TVT sling.  Procedure: After informed consent was obtained from the patient she was taken to the operating room where general anesthesia was initiated without difficulty.  The pt underwent above surgery by Dr. Dellis Filbert. See her op note for full details.  The mons pubis was shaved, the pubic symphysis was marked for the planned bilateral exit points, 2 cm lateral to the midline just above the a pubic symphysis.  A solution of 30 cc of 1% lidocaine with 1:100,000 units of epinephrine mixed with 60 cc of normal saline was used as the injection. 30 cc of this solution was injected into the suprapubic space bilaterally, an additional 10 cc of this solution was injected along the planned exit routes bilaterally. This placement was accomplished by placing a  20-gauge spinal needle from the planned abdominal exit points behind the pubic symphysis and into the retropubic space. This space was confirmed with the use of the hand in the vagina to feel the needle tip prior to aspirating and injecting. A Foley catheter was already in place from the prior surgery. The balloon was used to help identify the bladder and to plan the sling placement in the mid urethral position. The prior cytocele repair was evaluated and the 2 cm most distal angle of the repair (closest to the urethra) was not closed.  An Allis clamp was placed on the edges of the open incision on the vaginal mucosa. 10 cc of the injecting solution were placed in the vaginal mucosal skin under the urethra. Covington scissors were used to dissect the vaginal mucosa laterally. The dissection was taken to just below the level of the pubic symphysis. Once the planned entry routes were dissected a rigid Foley was placed into the bladder. The rigid Foley was deviated towards the patient's right knee. With the anesthesiologist pointing out the patient's right shoulder so that I could aim to the midclavicular line, the trocar with trocar sleeve and sling attached was inserted into the pre-dissected space and was pushed under the pubic symphysis. With dropping the trocar handle towards the ground and sharply rotating the trocar to the abdominal wall I was able to guide the trocar out the abdominal wall at the planned exit point. A Charleen Madera clamp was placed on the trocar sleeve the trocar was removed through the vagina. The sling tape was assessed and straightened and the trocar was placed in the opposite trocar sleeve. Again with retracting the dissected vaginal mucosa and placing the trocar in the planned left sided entry route, along with deviating the rigid Foley to the patient's left  knee and aiming towards the left midclavicular line the trocar was passed under the pubic symphysis sharply rotated out of the abdominal wall  and out the planned exit route. At this point cystoscopy was carried out. The bladder was filled to 300 cc and evaluation was done of the entire bladder mucosa and the urethra to ensure no sling material was eroding through the mucosa.  Using a Babcock clamp to protect a small knuckle of sling material in the midpoint of the sling just under the urethra, the sling was pulled out through the abdominal entry points. Proper tensioning was assured with the use of a Babcock clamp and once I was assured a tension-free placement Shantese Raven clamps were placed on the plastic sheaths at the abdominal exit points and the abdominal sheaths were removed. At this point a Kary Kos was released and a tension-free placement was seen. Good hemostasis was noted, a repeat cystoscopy was done to confirm no sling material and the bladder or urethra. Given the prior surgery, methylene blue was administered and blue urine was seen from both ureteral orifices. The vaginal mucosa was closed with 2-0 Vicryl on a CT-1. The patient tolerated the procedure well. Sponge, lap and needle counts were correct x3 and the patient was taken to the recovery room in stable condition.  Jennet Scroggin A. Lorri Fukuhara 03/07/2014 10:34 PM

## 2014-03-07 NOTE — Transfer of Care (Signed)
Immediate Anesthesia Transfer of Care Note  Patient: Julie [name]  Procedure(s) Performed: Procedure(s) with comments: ROBOTIC ASSISTED SUPRACERVICAL HYSTERECTOMY WITH BILATERAL SALPINGO OOPHORECTOMY (N/A) - 4 hrs. ROBOTIC ASSISTED LAPAROSCOPIC SACROCOLPOPEXY With Carrolyn Meiers (N/A) ANTERIOR REPAIR (CYSTOCELE) (N/A) TRANSVAGINAL TAPE (TVT) PROCEDURE WITH CYSTO (N/A) - vaginal   Patient Location: PACU  Anesthesia Type:General  Level of Consciousness: awake, alert , oriented and patient cooperative  Airway & Oxygen Therapy: Patient Spontanous Breathing and Patient connected to nasal cannula oxygen  Post-op Assessment: Report given to PACU RN, Post -op Vital signs reviewed and stable and Patient moving all extremities X 4  Post vital signs: Reviewed and stable  Complications: No apparent anesthesia complications

## 2014-03-07 NOTE — Anesthesia Preprocedure Evaluation (Addendum)
Anesthesia Evaluation  Patient identified by MRN, date of birth, ID band Patient awake    Reviewed: Allergy & Precautions, H&P , NPO status , Patient's Chart, lab work & pertinent test results  History of Anesthesia Complications (+) PONV and history of anesthetic complications (spinal for knee surgery "didn't work")  Airway Mallampati: I TM Distance: >3 FB Neck ROM: full    Dental  (+) Dental Advisory Given, Partial Lower, Upper Dentures, Edentulous Upper   Pulmonary neg pulmonary ROS,  breath sounds clear to auscultation  Pulmonary exam normal       Cardiovascular Exercise Tolerance: Good hypertension (BP elevated today - 153/69.  Pt reports her BP is normally 120/80.  Doesnt take BP meds becasue they make her sick), Rhythm:Regular Rate:Normal     Neuro/Psych negative neurological ROS  negative psych ROS   GI/Hepatic Neg liver ROS, GERD-  Medicated,  Endo/Other  diabetes (borderline)Hypothyroidism   Renal/GU negative Renal ROS Bladder dysfunction Female GU complaint     Musculoskeletal negative musculoskeletal ROS (+)   Abdominal   Peds negative pediatric ROS (+)  Hematology negative hematology ROS (+)   Anesthesia Other Findings Careful with positioning of left knee and lower back  Reproductive/Obstetrics negative OB ROS                         Anesthesia Physical  Anesthesia Plan  ASA: II  Anesthesia Plan: General ETT   Post-op Pain Management:    Induction: Intravenous  Airway Management Planned: Oral ETT  Additional Equipment:   Intra-op Plan:   Post-operative Plan: Extubation in OR  Informed Consent: I have reviewed the patients History and Physical, chart, labs and discussed the procedure including the risks, benefits and alternatives for the proposed anesthesia with the patient or authorized representative who has indicated his/her understanding and acceptance.    Dental advisory given  Plan Discussed with: CRNA and Surgeon  Anesthesia Plan Comments: (  Discussed general anesthesia, including possible nausea, instrumentation of airway, sore throat,pulmonary aspiration, etc. I asked if the were any outstanding questions, or  concerns before we proceeded. )       Anesthesia Quick Evaluation

## 2014-03-08 ENCOUNTER — Encounter (HOSPITAL_COMMUNITY): Payer: Self-pay | Admitting: Obstetrics & Gynecology

## 2014-03-08 LAB — CBC
HCT: 30.6 % — ABNORMAL LOW (ref 36.0–46.0)
HEMOGLOBIN: 9.9 g/dL — AB (ref 12.0–15.0)
MCH: 28.6 pg (ref 26.0–34.0)
MCHC: 32.4 g/dL (ref 30.0–36.0)
MCV: 88.4 fL (ref 78.0–100.0)
Platelets: 218 10*3/uL (ref 150–400)
RBC: 3.46 MIL/uL — AB (ref 3.87–5.11)
RDW: 14.9 % (ref 11.5–15.5)
WBC: 15.1 10*3/uL — ABNORMAL HIGH (ref 4.0–10.5)

## 2014-03-08 MED ORDER — HYDROCODONE-IBUPROFEN 5-200 MG PO TABS: 1.0000 | ORAL_TABLET | Freq: Three times a day (TID) | ORAL | Status: DC | PRN

## 2014-03-08 NOTE — Progress Notes (Signed)
Pt is discharged in the care of grand daugther with Allisonia. Denies any pain or discomfort, or problems voiding Denies any heavy vaginal bleeding . Abdominal lapsites are clean and dry. Discharge instructions with Rx were given to pt States she understands all instructions well. Questions were asked and answered.. Stable.Marland Kitchen

## 2014-03-08 NOTE — Discharge Instructions (Signed)
Supracervical Hysterectomy, Care After Refer to this sheet in the next few weeks. These instructions provide you with information on caring for yourself after your procedure. Your health care provider may also give you more specific instructions. Your treatment has been planned according to current medical practices, but problems sometimes occur. Call your health care provider if you have any problems or questions after your procedure.  WHAT TO EXPECT AFTER THE PROCEDURE After your procedure, it is typical to have some discomfort, tenderness, swelling, and bruising at the surgical sites. This normally lasts for about 2 weeks.  HOME CARE INSTRUCTIONS   Get plenty of rest and sleep.  Only take over-the-counter or prescription medicines as directed by your health care provider.  Do not take aspirin. It can cause bleeding.  Do not drive until your health care provider approves.  Follow your health care provider's advice regarding exercise, lifting, and general activities.  Resume your usual diet as directed by your health care provider.  Do not douche, use tampons, or have sexual intercourse for at least 6 weeks or until your health care provider gives you permission.  Change your bandages (dressings) only as directed by your health care provider.  Monitor your temperature.  Take showers instead of baths for 2 3 weeks or as directed by your health care provider.  Drink enough fluids to keep your urine clear or pale yellow.  Do not drink alcohol until your health care provider gives you permission.  If you are constipated, you may take a mild laxative if your health care provider approves. Bran foods may also help with constipation problems.  Try to have someone home with you for 1 2 weeks to help with activities.  Follow up with your health care provider as directed. SEEK MEDICAL CARE IF:  You have swelling, redness, or increasing pain in the incision area.  You have pus coming  from an incision.  You notice a bad smell coming from the incision or dressing.  You have swelling, redness, or pain in the area around the IV site.  Your incision breaks open.  You feel dizzy or lightheaded.  You have pain or bleeding when you urinate.  You have persistent diarrhea.  You have persistent nausea and vomiting.  You have abnormal vaginal discharge.  You have a rash.  Your pain is not controlled with your prescribed medicine. SEEK IMMEDIATE MEDICAL CARE IF:  You have a fever.  You have severe abdominal pain.  You have chest pain.  You have shortness of breath.  You faint.  You have pain, swelling, or redness in your leg.  You have heavy vaginal bleeding with blood clots. Document Released: 08/09/2013 Document Reviewed: 04/21/2013 Exit[employer] Patient Information 2014 [employer].  

## 2014-03-08 NOTE — Progress Notes (Signed)
1 Day Post-Op Procedure(s) (LRB): ROBOTIC ASSISTED SUPRACERVICAL HYSTERECTOMY WITH BILATERAL SALPINGO OOPHORECTOMY (N/A) ROBOTIC ASSISTED LAPAROSCOPIC SACROCOLPOPEXY With Carrolyn Meiers (N/A) ANTERIOR REPAIR (CYSTOCELE) (N/A) TRANSVAGINAL TAPE (TVT) PROCEDURE WITH CYSTO (N/A)  Subjective: Patient reports that pain is well managed.  Tolerating normal diet as tolerated  diet without difficulty. No nausea / vomiting.  Ambulating and voiding.  Foley successfully weaned  Objective: BP 117/54  Pulse 64  Temp(Src) 98 F (36.7 C) (Oral)  Resp 18  Ht 4\' 10"  (1.473 m)  Wt 58.514 kg (129 lb)  BMI 26.97 kg/m2  SpO2 96% Lungs: clear Heart: normal rate and rhythm Abdomen:soft and appropriately tender Extremities: Homans sign is negative, no sign of DVT Incision: healing well  Hb postop 9.9  Assessment: s/p Procedure(s): ROBOTIC ASSISTED SUPRACERVICAL HYSTERECTOMY WITH BILATERAL SALPINGO OOPHORECTOMY ROBOTIC ASSISTED LAPAROSCOPIC SACROCOLPOPEXY With Y-Mesh ANTERIOR REPAIR (CYSTOCELE) TRANSVAGINAL TAPE (TVT) PROCEDURE WITH CYSTO: progressing well  Plan: Discharge home  LOS: 1 day    Julie [name] 03/08/2014, 4:21 PM

## 2014-03-08 NOTE — Discharge Summary (Signed)
  Physician Discharge Summary  Patient ID: SHAHEEN STAR MRN: [id number] DOB/AGE: 75-04-1941 75 y.o.  Admit date: 03/07/2014 Discharge date: 03/08/2014  Admission Diagnoses: Complete Uterine Prolapse Eversion  Discharge Diagnoses: Complete Uterine Prolapse Eversion        Active Problems:   Postoperative state   Discharged Condition: good  Hospital Course: good  Consults: None  Treatments: surgery: Robotic supracervical hysterectomy with bilateral salpingo-oophorectomy, Sacrocolpopexy, anterior repair, Sling procedure  Disposition: 03-Skilled Nursing Facility   Future Appointments Provider Department Dept Phone   04/30/2014 2:15 PM Doe-Hyun Kyra Searles, [place] [facility]       Medication List         ARMOUR THYROID 30 MG tablet  Generic drug:  thyroid  take 1 tablet by mouth once daily     hydrocodone-ibuprofen 5-200 MG per tablet  Commonly known as:  VICOPROFEN  Take 1 tablet by mouth every 8 (eight) hours as needed for pain.     omeprazole 40 MG capsule  Commonly known as:  PRILOSEC  Take 1 capsule (40 mg total) by mouth daily.     zolpidem 10 MG tablet  Commonly known as:  AMBIEN  take 1 tablet by mouth at bedtime if needed           Follow-up Information   Follow up with Boe Deans,MARIE-LYNE, MD In 3 weeks.   Specialty:  Obstetrics and Gynecology   Contact information:   [address] [place] [address] [phone]       Signed: Princess Bruins, MD 03/08/2014, 4:25 PM

## 2014-04-30 ENCOUNTER — Encounter: Payer: Self-pay | Admitting: Internal Medicine

## 2014-04-30 ENCOUNTER — Ambulatory Visit (INDEPENDENT_AMBULATORY_CARE_PROVIDER_SITE_OTHER): Payer: Medicare Other | Admitting: Internal Medicine

## 2014-04-30 VITALS — BP 136/82 | HR 72 | Temp 97.9°F | Ht 58.75 in | Wt 132.0 lb

## 2014-04-30 DIAGNOSIS — E039 Hypothyroidism, unspecified: Secondary | ICD-10-CM

## 2014-04-30 DIAGNOSIS — E7800 Pure hypercholesterolemia, unspecified: Secondary | ICD-10-CM

## 2014-04-30 DIAGNOSIS — M810 Age-related osteoporosis without current pathological fracture: Secondary | ICD-10-CM

## 2014-04-30 MED ORDER — DENOSUMAB 60 MG/ML ~~LOC~~ SOLN
60.0000 mg | Freq: Once | SUBCUTANEOUS | Status: AC
  Administered 2014-04-30: 60 mg via SUBCUTANEOUS

## 2014-04-30 NOTE — Patient Instructions (Addendum)
Please follow low cholesterol diet as directed. Please complete the following lab tests before your next follow up appointment: BMET, FLP, LFTs - 272.4 TSH - 244.9 CBCD-285.9  Please contact our office about Tetanus vaccine before your next office visit You can receive the Pertussis vaccine at [facility] office

## 2014-04-30 NOTE — Assessment & Plan Note (Signed)
Patient's 10 year cardiovascular risk is 4.5%. Continue management dietary changes. Handout for low saturated fat diet provided.

## 2014-04-30 NOTE — Progress Notes (Signed)
Pre visit review using our clinic review tool, if applicable. No additional management support is needed unless otherwise documented below in the visit note. 

## 2014-04-30 NOTE — Progress Notes (Signed)
Subjective:    Patient ID: Julie [name], female    DOB: Feb 20, 1941, 73 y.o.   MRN: [id number]  HPI  73 year old white female with history of osteoporosis, hyperlipidemia and hypothyroidism for routine followup. Interval medical history patient's was seen by her gynecologist for complete uterine prolapse eversion. She underwent robotic supracervical hysterectomy with bilateral salpingo-oophorectomy, sacrolcolpopexy, anterior repair, and sling procedure. She tolerated procedure well.  Osteoporosis - she is due for Prolia injection.  No adverse reaction to previous reaction.  Hyperlipidemia - diet controlled.  She is not interested in statin therapy   Review of Systems No weight change.  No fatigue    Past Medical History  Diagnosis Date  . Personal history of colonic polyps   . Contact dermatitis and other eczema, due to unspecified cause     History  . Unspecified disorder of thyroid   . Arthropathy, unspecified, site unspecified   . Pure hypercholesterolemia   . Unspecified hypothyroidism   . Other bursitis disorders     history  . PONV (postoperative nausea and vomiting)   . SVD (spontaneous vaginal delivery)     x 1  . GERD (gastroesophageal reflux disease)     history - no current prob - no med  . 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 Arthritis     lower back shoulder  . Cancer     skin -spots on face removed    History   Social History  . Marital Status: Married    Spouse Name: N/A    Number of Children: N/A  . Years of Education: N/A   Occupational History  . Not on file.   Social History Main Topics  . Smoking status: Never Smoker   . Smokeless tobacco: Never Used  . Alcohol Use: Yes     Comment: rare  . Drug Use: No  . Sexual Activity: Yes    Birth Control/ Protection: Post-menopausal   Other Topics Concern  . Not on file   Social History Narrative  . No narrative on file    Past Surgical  History  Procedure Laterality Date  . Tubal ligation    . Lumbar laminectomy    . Knee arthroscopy  10/21/2012    right - Procedure: ARTHROSCOPY KNEE;  Surgeon: Mcarthur Rossetti, MD;  Location: [facility];  Service: Orthopedics;  Laterality: Left;  Left Knee Arthroscopy, Debridement, partial synovectomy  . Devaited septum surgery    . Eye surgery      bilateral cataract surgery  . Blephorplasty       bilateral   . 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 Back surgery  2002    L4/L5  . Breast surgery      x 2 breast implants, later removed  . Wisdom tooth extraction    . Colonoscopy    . Robotic assisted supracervical hysterectomy with bilateral salpingo oopherectomy N/A 03/07/2014    Procedure: ROBOTIC ASSISTED SUPRACERVICAL HYSTERECTOMY WITH BILATERAL SALPINGO OOPHORECTOMY;  Surgeon: Princess Bruins, MD;  Location: [facility];  Service: Gynecology;  Laterality: N/A;  4 hrs.  . Robotic assisted laparoscopic sacrocolpopexy N/A 03/07/2014    Procedure: ROBOTIC ASSISTED LAPAROSCOPIC SACROCOLPOPEXY With Carrolyn Meiers;  Surgeon: Princess Bruins, MD;  Location: [facility];  Service: Gynecology;  Laterality: N/A;  . Cystocele repair N/A 03/07/2014    Procedure: ANTERIOR REPAIR (CYSTOCELE);  Surgeon: Princess Bruins, MD;  Location: [facility];  Service: Gynecology;  Laterality: N/A;  . Bladder suspension N/A 03/07/2014    Procedure: TRANSVAGINAL TAPE (TVT) PROCEDURE WITH CYSTO;  Surgeon: Princess Bruins, MD;  Location: [facility];  Service: Gynecology;  Laterality: N/A;  vaginal     Family History  Problem Relation Age of Onset  . Cancer Mother     breast and lung  . Stroke Father   . Heart attack Father   . Diabetes Sister   . Emphysema Maternal Aunt   . Colon cancer Neg Hx     Allergies  Allergen Reactions  . Penicillins Anaphylaxis     REACTION: causes tongue to swell  . Codeine Nausea Only  . Lisinopril     REACTION: wheezing    Current Outpatient Prescriptions on File Prior to Visit  Medication Sig Dispense Refill  . ARMOUR THYROID 30 MG tablet take 1 tablet by mouth once daily  30 tablet  1  . hydrocodone-ibuprofen (VICOPROFEN) 5-200 MG per tablet Take 1 tablet by mouth every 8 (eight) hours as needed for pain.  10 tablet  0  . omeprazole (PRILOSEC) 40 MG capsule Take 1 capsule (40 mg total) by mouth daily.  90 capsule  1  . zolpidem (AMBIEN) 10 MG tablet take 1 tablet by mouth at bedtime if needed  30 tablet  5   No current facility-administered medications on file prior to visit.    BP 136/82  Pulse 72  Temp(Src) 97.9 F (36.6 C) (Oral)  Ht 4' 10.75" (1.492 m)  Wt 132 lb (59.875 kg)  BMI 26.90 kg/m2    Objective:   Physical Exam  Constitutional: She appears well-developed and well-nourished.  Neck: Neck supple.  No carotid bruit  Cardiovascular: Normal rate, regular rhythm and normal heart sounds.   Pulmonary/Chest: Effort normal and breath sounds normal. She has no wheezes.          Assessment & Plan:

## 2014-04-30 NOTE — Assessment & Plan Note (Signed)
Patient received her second Prolia injections today. No adverse reactions.

## 2014-04-30 NOTE — Assessment & Plan Note (Signed)
Stable. We discussed obtaining thyroid function tests before next office visit in 6 months.

## 2014-05-01 ENCOUNTER — Other Ambulatory Visit: Payer: Self-pay | Admitting: Internal Medicine

## 2014-05-04 ENCOUNTER — Other Ambulatory Visit: Payer: Self-pay | Admitting: Internal Medicine

## 2014-06-05 ENCOUNTER — Other Ambulatory Visit: Payer: Self-pay | Admitting: Dermatology

## 2014-08-30 ENCOUNTER — Telehealth: Payer: Self-pay

## 2014-08-30 NOTE — Telephone Encounter (Signed)
Called pt to change the 10/29/14 appt time from 2:15 to 2:30. Pt stated that they wanted the appt canceled and not rescheduled and will call back.

## 2014-10-24 ENCOUNTER — Other Ambulatory Visit: Payer: Self-pay | Admitting: Internal Medicine

## 2014-10-25 ENCOUNTER — Other Ambulatory Visit: Payer: Self-pay | Admitting: Internal Medicine

## 2014-10-29 ENCOUNTER — Ambulatory Visit: Payer: Medicare Other | Admitting: Internal Medicine

## 2015-01-21 ENCOUNTER — Other Ambulatory Visit: Payer: Self-pay | Admitting: Dermatology

## 2015-02-11 ENCOUNTER — Ambulatory Visit (INDEPENDENT_AMBULATORY_CARE_PROVIDER_SITE_OTHER): Payer: Commercial Managed Care - HMO | Admitting: Internal Medicine

## 2015-02-11 ENCOUNTER — Encounter: Payer: Self-pay | Admitting: Internal Medicine

## 2015-02-11 VITALS — BP 130/70 | HR 76 | Temp 97.6°F | Ht 58.75 in | Wt 143.0 lb

## 2015-02-11 DIAGNOSIS — E7800 Pure hypercholesterolemia, unspecified: Secondary | ICD-10-CM

## 2015-02-11 DIAGNOSIS — Z0000 Encounter for general adult medical examination without abnormal findings: Secondary | ICD-10-CM | POA: Diagnosis not present

## 2015-02-11 DIAGNOSIS — M810 Age-related osteoporosis without current pathological fracture: Secondary | ICD-10-CM

## 2015-02-11 DIAGNOSIS — Z23 Encounter for immunization: Secondary | ICD-10-CM | POA: Diagnosis not present

## 2015-02-11 DIAGNOSIS — M25579 Pain in unspecified ankle and joints of unspecified foot: Secondary | ICD-10-CM

## 2015-02-11 DIAGNOSIS — I10 Essential (primary) hypertension: Secondary | ICD-10-CM

## 2015-02-11 DIAGNOSIS — E039 Hypothyroidism, unspecified: Secondary | ICD-10-CM | POA: Diagnosis not present

## 2015-02-11 LAB — CBC WITH DIFFERENTIAL/PLATELET
Basophils Absolute: 0 10*3/uL (ref 0.0–0.1)
Basophils Relative: 0.1 % (ref 0.0–3.0)
EOS PCT: 1.4 % (ref 0.0–5.0)
Eosinophils Absolute: 0.1 10*3/uL (ref 0.0–0.7)
HEMATOCRIT: 36.5 % (ref 36.0–46.0)
HEMOGLOBIN: 12.1 g/dL (ref 12.0–15.0)
Lymphocytes Relative: 22.9 % (ref 12.0–46.0)
Lymphs Abs: 2 10*3/uL (ref 0.7–4.0)
MCHC: 33.2 g/dL (ref 30.0–36.0)
MCV: 85.1 fl (ref 78.0–100.0)
Monocytes Absolute: 0.6 10*3/uL (ref 0.1–1.0)
Monocytes Relative: 6.5 % (ref 3.0–12.0)
NEUTROS ABS: 5.9 10*3/uL (ref 1.4–7.7)
Neutrophils Relative %: 69.1 % (ref 43.0–77.0)
Platelets: 257 10*3/uL (ref 150.0–400.0)
RBC: 4.28 Mil/uL (ref 3.87–5.11)
RDW: 14.5 % (ref 11.5–15.5)
WBC: 8.6 10*3/uL (ref 4.0–10.5)

## 2015-02-11 LAB — HEPATIC FUNCTION PANEL
ALBUMIN: 3.9 g/dL (ref 3.5–5.2)
ALK PHOS: 87 U/L (ref 39–117)
ALT: 11 U/L (ref 0–35)
AST: 14 U/L (ref 0–37)
Bilirubin, Direct: 0.1 mg/dL (ref 0.0–0.3)
TOTAL PROTEIN: 6.8 g/dL (ref 6.0–8.3)
Total Bilirubin: 0.4 mg/dL (ref 0.2–1.2)

## 2015-02-11 LAB — BASIC METABOLIC PANEL
BUN: 13 mg/dL (ref 6–23)
CALCIUM: 9.5 mg/dL (ref 8.4–10.5)
CHLORIDE: 108 meq/L (ref 96–112)
CO2: 28 meq/L (ref 19–32)
CREATININE: 0.71 mg/dL (ref 0.40–1.20)
GFR: 85.23 mL/min (ref >60.00)
GLUCOSE: 102 mg/dL — AB (ref 70–99)
POTASSIUM: 3.5 meq/L (ref 3.5–5.1)
SODIUM: 142 meq/L (ref 135–145)

## 2015-02-11 LAB — LIPID PANEL
CHOL/HDL RATIO: 5
Cholesterol: 191 mg/dL (ref 0–200)
HDL: 40.5 mg/dL (ref >39.00)
LDL Cholesterol: 113 mg/dL — ABNORMAL HIGH (ref 0–99)
NonHDL: 150.5
Triglycerides: 189 mg/dL — ABNORMAL HIGH (ref 0.0–149.0)
VLDL: 37.8 mg/dL (ref 0.0–40.0)

## 2015-02-11 LAB — TSH: TSH: 2.07 u[IU]/mL (ref 0.35–4.50)

## 2015-02-11 MED ORDER — ALENDRONATE SODIUM 70 MG PO TABS: 70.0000 mg | ORAL_TABLET | ORAL | Status: DC

## 2015-02-11 NOTE — Progress Notes (Signed)
Subjective:    Patient ID: Julie [name], female    DOB: Feb 12, 1941, 74 y.o.   MRN: [id number]  HPI  74 year old white female with history of hypertension, osteoporosis, and hyperlipidemia presents for Medicare Wellness exam.  Medicare questionnaire reviewed in detail. See attached forms.  Osteoporosis-patient did not continue Prolia. She discontinued due to concern for possible side effects.  Hypothyroidism-she would like to continue Armour Thyroid. She tried levothyroxine in the past but discontinued due to side effect of alopecia.   Review of Systems  Constitutional: Negative for activity change, appetite change and unexpected weight change.  Eyes: Negative for visual disturbance.  Respiratory: Negative for cough, chest tightness and shortness of breath.  Cardiovascular: Negative for chest pain.  Genitourinary: Negative for difficulty urinating.  Neurological: Negative for headaches.  Gastrointestinal: Negative for abdominal pain, heartburn melena or hematochezia Psych: Negative for depression or anxiety Musculoskeletal:  She complained of chronic foot discomfort due to deformity.  She also complains of chronic hand pain        Past Medical History  Diagnosis Date  . Personal history of colonic polyps   . Contact dermatitis and other eczema, due to unspecified cause     History  . Unspecified disorder of thyroid   . Arthropathy, unspecified, site unspecified   . Pure hypercholesterolemia   . Unspecified hypothyroidism   . Other bursitis disorders     history  . PONV (postoperative nausea and vomiting)   . SVD (spontaneous vaginal delivery)     x 1  . GERD (gastroesophageal reflux disease)     history - no current prob - no med  . 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 Arthritis     lower back shoulder  . Cancer     skin -spots on face removed    History   Social History  . Marital Status: Married    Spouse  Name: N/A  . Number of Children: N/A  . Years of Education: N/A   Occupational History  . Not on file.   Social History Main Topics  . Smoking status: Never Smoker   . Smokeless tobacco: Never Used  . Alcohol Use: Yes     Comment: rare  . Drug Use: No  . Sexual Activity: Yes    Birth Control/ Protection: Post-menopausal   Other Topics Concern  . Not on file   Social History Narrative    Past Surgical History  Procedure Laterality Date  . Tubal ligation    . Lumbar laminectomy    . Knee arthroscopy  10/21/2012    right - Procedure: ARTHROSCOPY KNEE;  Surgeon: Mcarthur Rossetti, MD;  Location: [facility];  Service: Orthopedics;  Laterality: Left;  Left Knee Arthroscopy, Debridement, partial synovectomy  . Devaited septum surgery    . Eye surgery      bilateral cataract surgery  . Blephorplasty       bilateral   . 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 Back surgery  2002    L4/L5  . Breast surgery      x 2 breast implants, later removed  . Wisdom tooth extraction    . Colonoscopy    . Robotic assisted supracervical hysterectomy with bilateral salpingo oopherectomy N/A 03/07/2014  Procedure: ROBOTIC ASSISTED SUPRACERVICAL HYSTERECTOMY WITH BILATERAL SALPINGO OOPHORECTOMY;  Surgeon: Princess Bruins, MD;  Location: [facility];  Service: Gynecology;  Laterality: N/A;  4 hrs.  . Robotic assisted laparoscopic sacrocolpopexy N/A 03/07/2014    Procedure: ROBOTIC ASSISTED LAPAROSCOPIC SACROCOLPOPEXY With Carrolyn Meiers;  Surgeon: Princess Bruins, MD;  Location: [facility];  Service: Gynecology;  Laterality: N/A;  . Cystocele repair N/A 03/07/2014    Procedure: ANTERIOR REPAIR (CYSTOCELE);  Surgeon: Princess Bruins, MD;  Location: [facility];  Service: Gynecology;  Laterality: N/A;  . Bladder suspension N/A 03/07/2014    Procedure:  TRANSVAGINAL TAPE (TVT) PROCEDURE WITH CYSTO;  Surgeon: Princess Bruins, MD;  Location: [facility];  Service: Gynecology;  Laterality: N/A;  vaginal     Family History  Problem Relation Age of Onset  . Cancer Mother     breast and lung  . Stroke Father   . Heart attack Father   . Diabetes Sister   . Emphysema Maternal Aunt   . Colon cancer Neg Hx     Allergies  Allergen Reactions  . Penicillins Anaphylaxis    REACTION: causes tongue to swell  . Codeine Nausea Only  . Lisinopril     REACTION: wheezing    Current Outpatient Prescriptions on File Prior to Visit  Medication Sig Dispense Refill  . ARMOUR THYROID 30 MG tablet take 1 tablet by mouth once daily 90 tablet 1  . omeprazole (PRILOSEC) 40 MG capsule take 1 capsule by mouth once daily 90 capsule 1  . zolpidem (AMBIEN) 10 MG tablet take 1 tablet by mouth at bedtime if needed 30 tablet 0   No current facility-administered medications on file prior to visit.    BP 130/70 mmHg  Pulse 76  Temp(Src) 97.6 F (36.4 C) (Oral)  Ht 4' 10.75" (1.492 m)  Wt 143 lb (64.864 kg)  BMI 29.14 kg/m2       Objective:   Physical Exam  Constitutional: She is oriented to person, place, and time. She appears well-developed and well-nourished. No distress.  HENT:  Head: Normocephalic and atraumatic.  Right Ear: External ear normal.  Left Ear: External ear normal.  Eyes: Conjunctivae and EOM are normal. Pupils are equal, round, and reactive to light.  Neck: Neck supple.  No carotid bruit  Cardiovascular: Normal rate, regular rhythm, normal heart sounds and intact distal pulses.   No murmur heard. Pulmonary/Chest: Effort normal and breath sounds normal. She has no wheezes.  Abdominal: Bowel sounds are normal. She exhibits no distension and no mass. There is no tenderness.  Musculoskeletal: She exhibits no edema.  Neurological: She is alert and oriented to person, place, and time. No cranial nerve deficit.  Skin: Skin is warm and dry.   Psychiatric: She has a normal mood and affect. Her behavior is normal.          Assessment & Plan:

## 2015-02-11 NOTE — Assessment & Plan Note (Addendum)
She complains of chronic foot pain.  Refer to podiatrist for further evaluation and treatment.  Dr. Blenda Mounts

## 2015-02-11 NOTE — Assessment & Plan Note (Signed)
Monitor TFTs.  Adjust armour thyroid dose accordingly.  She could not tolerate levothyroxine due to side effect of hair loss.  Initiate prior authorization for armour thyroid.

## 2015-02-11 NOTE — Assessment & Plan Note (Signed)
Medicare questionnaire reviewed in detail with patient. Screening for memory loss normal.  Patient denies any issues with hearing loss, pressed mood or gait issues. Patient updated with adult vaccines.  Patient to inquire about coverage for Zostavax.  Refer for repeat mammogram and DEXA.

## 2015-02-11 NOTE — Assessment & Plan Note (Signed)
Patient stopped Prolia due to concern for possible side effects.  Trial of weekly Fosamax.  We discussed potential side effects.

## 2015-02-11 NOTE — Assessment & Plan Note (Signed)
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  Use voltaren gel as directed.

## 2015-02-11 NOTE — Patient Instructions (Signed)
Please ask your insurance company about shingles vaccine. Our office will contact you re: scheduling mammogram and bone density scan

## 2015-02-11 NOTE — Progress Notes (Signed)
Pre visit review using our clinic review tool, if applicable. No additional management support is needed unless otherwise documented below in the visit note. 

## 2015-02-12 ENCOUNTER — Other Ambulatory Visit: Payer: Self-pay | Admitting: *Deleted

## 2015-02-12 MED ORDER — THYROID 30 MG PO TABS: 30.0000 mg | ORAL_TABLET | Freq: Every day | ORAL | Status: DC

## 2015-02-25 ENCOUNTER — Ambulatory Visit (INDEPENDENT_AMBULATORY_CARE_PROVIDER_SITE_OTHER): Payer: Commercial Managed Care - HMO

## 2015-02-25 ENCOUNTER — Ambulatory Visit (INDEPENDENT_AMBULATORY_CARE_PROVIDER_SITE_OTHER): Payer: Commercial Managed Care - HMO | Admitting: Podiatry

## 2015-02-25 ENCOUNTER — Encounter: Payer: Self-pay | Admitting: Podiatry

## 2015-02-25 VITALS — BP 133/73 | HR 78 | Resp 12 | Ht 59.5 in | Wt 139.0 lb

## 2015-02-25 DIAGNOSIS — M79672 Pain in left foot: Secondary | ICD-10-CM

## 2015-02-25 DIAGNOSIS — L84 Corns and callosities: Secondary | ICD-10-CM | POA: Diagnosis not present

## 2015-02-25 DIAGNOSIS — M2041 Other hammer toe(s) (acquired), right foot: Secondary | ICD-10-CM

## 2015-02-25 DIAGNOSIS — M79671 Pain in right foot: Secondary | ICD-10-CM | POA: Diagnosis not present

## 2015-02-25 NOTE — Progress Notes (Signed)
   Subjective:    Patient ID: Julie [name], female    DOB: [birth year], 74 y.o.   MRN: [id number]  HPI Comments: Pt complains of a painful lump on the bottom the left foot for 2 - weeks.  Pt states she has two toes that cross and rub a corn on the right 2nd toe - 2, 3rd toes for years.  Foot Pain      Review of Systems  Musculoskeletal: Positive for back pain.  Hematological: Bruises/bleeds easily.  All other systems reviewed and are negative.      Objective:   Physical Exam        Assessment & Plan:

## 2015-02-26 NOTE — Progress Notes (Signed)
Subjective:     Patient ID: Julie [name], female   DOB: [birth year], 74 y.o.   MRN: [id number]  HPI patient presents with a lesion on the plantar aspect of the left foot that is painful when pressed and digital deformity second and third right stating that she like to know if there is a brace that she could use   Review of Systems  All other systems reviewed and are negative.      Objective:   Physical Exam  Constitutional: She is oriented to person, place, and time.  Cardiovascular: Intact distal pulses.   Musculoskeletal: Normal range of motion.  Neurological: She is oriented to person, place, and time.  Skin: Skin is warm and dry.  Nursing note and vitals reviewed.  neurovascular status intact muscle strength was noted to be immediate with range of motion subtalar midtarsal joint within normal limits. 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 Today I debrided plantar lesion left and then went ahead and debrided lesion between the second and third toes right and applied digital splint to the right to try to separate the toes. Reappoint to recheck

## 2015-02-27 ENCOUNTER — Telehealth: Payer: Self-pay | Admitting: Internal Medicine

## 2015-02-27 NOTE — Telephone Encounter (Signed)
Pt would like to go to [facility] for a Mammogram

## 2015-02-27 NOTE — Telephone Encounter (Signed)
Order faxed to [facility] for mammogram and bone density

## 2015-03-05 LAB — HM MAMMOGRAPHY: HM Mammogram: NORMAL

## 2015-03-26 ENCOUNTER — Encounter: Payer: Self-pay | Admitting: Podiatry

## 2015-03-26 ENCOUNTER — Ambulatory Visit (INDEPENDENT_AMBULATORY_CARE_PROVIDER_SITE_OTHER): Payer: Commercial Managed Care - HMO | Admitting: Podiatry

## 2015-03-26 VITALS — BP 147/89 | HR 66 | Resp 12

## 2015-03-26 DIAGNOSIS — M216X9 Other acquired deformities of unspecified foot: Secondary | ICD-10-CM

## 2015-03-26 DIAGNOSIS — Q667 Congenital pes cavus: Secondary | ICD-10-CM | POA: Diagnosis not present

## 2015-03-26 DIAGNOSIS — L84 Corns and callosities: Secondary | ICD-10-CM

## 2015-03-27 NOTE — Progress Notes (Signed)
Subjective:     Patient ID: Julie [name], female   DOB: 1941-08-15, 74 y.o.   MRN: [id number]  HPI patient is found to have lesion underneath the fourth metatarsal left foot that's painful when pressed and has also what appears to be a bone deformity that is part of the pathological process. Patient states the pain has increased   Review of Systems     Objective:   Physical Exam Neurovascular status intact with muscle strength adequate range of motion within normal limits. Patient's noted to have a plantarflexed metatarsal fourth left it's painful when pressed with keratotic lesion formation that the patient has trouble walking comfortably with    Assessment:     Plantarflexed metatarsal with lesion formation and pain    Plan:     Discussed possibility at one point for elevating osteotomy and at this time debrided the lesion on the left foot with no iatrogenic bleeding noted

## 2015-04-03 ENCOUNTER — Encounter: Payer: Self-pay | Admitting: Internal Medicine

## 2015-04-12 ENCOUNTER — Encounter: Payer: Self-pay | Admitting: Gastroenterology

## 2015-04-27 ENCOUNTER — Other Ambulatory Visit: Payer: Self-pay | Admitting: Internal Medicine

## 2015-05-01 ENCOUNTER — Other Ambulatory Visit: Payer: Self-pay | Admitting: Internal Medicine

## 2015-05-13 ENCOUNTER — Ambulatory Visit (INDEPENDENT_AMBULATORY_CARE_PROVIDER_SITE_OTHER): Payer: Commercial Managed Care - HMO | Admitting: Internal Medicine

## 2015-05-13 ENCOUNTER — Encounter: Payer: Self-pay | Admitting: Internal Medicine

## 2015-05-13 VITALS — BP 102/78 | HR 92 | Temp 98.2°F | Ht 59.5 in | Wt 143.1 lb

## 2015-05-13 DIAGNOSIS — M810 Age-related osteoporosis without current pathological fracture: Secondary | ICD-10-CM

## 2015-05-13 NOTE — Assessment & Plan Note (Addendum)
Patient tolerating oral Fosmax despite history of GERD.  We discussed potential side effects.  Continue same dose along with calcium and vitamin D.    If she develops GI side effects, we discussed switching to IV ReClast.

## 2015-05-13 NOTE — Progress Notes (Signed)
Pre visit review using our clinic review tool, if applicable. No additional management support is needed unless otherwise documented below in the visit note. 

## 2015-05-13 NOTE — Progress Notes (Signed)
Subjective:    Patient ID: Julie [name], female    DOB: 09-19-1941, 74 y.o.   MRN: [id number]  HPI  74 year old white female for follow-up regarding osteoporosis. Patient discontinued Prolia injections due to fear of side effects. Despite history of GERD we elected for trial of Fosamax. She has been taking medication for 7 weeks. She denies any adverse effects. She also takes calcium and vitamin D supplementation on a regular basis.   Review of Systems Negative for stomach pain or irritation    Past Medical History  Diagnosis Date  . Personal history of colonic polyps   . Contact dermatitis and other eczema, due to unspecified cause     History  . Unspecified disorder of thyroid   . Arthropathy, unspecified, site unspecified   . Pure hypercholesterolemia   . Unspecified hypothyroidism   . Other bursitis disorders     history  . PONV (postoperative nausea and vomiting)   . SVD (spontaneous vaginal delivery)     x 1  . GERD (gastroesophageal reflux disease)     history - no current prob - no med  . 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 Arthritis     lower back shoulder  . Cancer     skin -spots on face removed    History   Social History  . Marital Status: Married    Spouse Name: N/A  . Number of Children: N/A  . Years of Education: N/A   Occupational History  . Not on file.   Social History Main Topics  . Smoking status: Never Smoker   . Smokeless tobacco: Never Used  . Alcohol Use: Yes     Comment: rare  . Drug Use: No  . Sexual Activity: Yes    Birth Control/ Protection: Post-menopausal   Other Topics Concern  . Not on file   Social History Narrative    Past Surgical History  Procedure Laterality Date  . Tubal ligation    . Lumbar laminectomy    . Knee arthroscopy  10/21/2012    right - Procedure: ARTHROSCOPY KNEE;  Surgeon: Mcarthur Rossetti, MD;  Location: [facility];  Service: Orthopedics;   Laterality: Left;  Left Knee Arthroscopy, Debridement, partial synovectomy  . Devaited septum surgery    . Eye surgery      bilateral cataract surgery  . Blephorplasty       bilateral   . 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 Back surgery  2002    L4/L5  . Breast surgery      x 2 breast implants, later removed  . Wisdom tooth extraction    . Colonoscopy    . Robotic assisted supracervical hysterectomy with bilateral salpingo oopherectomy N/A 03/07/2014    Procedure: ROBOTIC ASSISTED SUPRACERVICAL HYSTERECTOMY WITH BILATERAL SALPINGO OOPHORECTOMY;  Surgeon: Princess Bruins, MD;  Location: [facility];  Service: Gynecology;  Laterality: N/A;  4 hrs.  . Robotic assisted laparoscopic sacrocolpopexy N/A 03/07/2014    Procedure: ROBOTIC ASSISTED LAPAROSCOPIC SACROCOLPOPEXY With Carrolyn Meiers;  Surgeon: Princess Bruins, MD;  Location: [facility];  Service: Gynecology;  Laterality: N/A;  . Cystocele repair N/A 03/07/2014    Procedure: ANTERIOR REPAIR (CYSTOCELE);  Surgeon: Princess Bruins, MD;  Location: [facility];  Service: Gynecology;  Laterality:  N/A;  . Bladder suspension N/A 03/07/2014    Procedure: TRANSVAGINAL TAPE (TVT) PROCEDURE WITH CYSTO;  Surgeon: Princess Bruins, MD;  Location: [facility];  Service: Gynecology;  Laterality: N/A;  vaginal     Family History  Problem Relation Age of Onset  . Cancer Mother     breast and lung  . Stroke Father   . Heart attack Father   . Diabetes Sister   . Emphysema Maternal Aunt   . Colon cancer Neg Hx     Allergies  Allergen Reactions  . Penicillins Anaphylaxis    REACTION: causes tongue to swell  . Codeine Nausea Only  . Lisinopril     REACTION: wheezing  . Percocet [Oxycodone-Acetaminophen] Nausea And Vomiting    Current Outpatient Prescriptions on File Prior to Visit  Medication Sig  Dispense Refill  . alendronate (FOSAMAX) 70 MG tablet Take 1 tablet (70 mg total) by mouth every 7 (seven) days. Take with a full glass of water on an empty stomach. 4 tablet 11  . ARMOUR THYROID 30 MG tablet take 1 tablet by mouth once daily 90 tablet 1  . Cholecalciferol 5000 UNITS capsule Take 5,000 Units by mouth daily.    Marland Kitchen KRILL OIL PO Take by mouth.    Marland Kitchen omeprazole (PRILOSEC) 40 MG capsule take 1 capsule by mouth once daily 90 capsule 1  . zolpidem (AMBIEN) 10 MG tablet take 1 tablet by mouth at bedtime if needed 30 tablet 0   No current facility-administered medications on file prior to visit.    BP 102/78 mmHg  Pulse 92  Temp(Src) 98.2 F (36.8 C) (Oral)  Ht 4' 11.5" (1.511 m)  Wt 143 lb 1.6 oz (64.91 kg)  BMI 28.43 kg/m2    Objective:   Physical Exam  Constitutional: She appears well-developed and well-nourished. No distress.  Cardiovascular: Normal rate, regular rhythm and normal heart sounds.   No murmur heard. Pulmonary/Chest: Effort normal and breath sounds normal. She has no wheezes.  Skin: Skin is warm and dry.  Psychiatric: She has a normal mood and affect. Her behavior is normal.          Assessment & Plan:

## 2015-05-22 ENCOUNTER — Other Ambulatory Visit: Payer: Self-pay

## 2015-05-22 DIAGNOSIS — M25542 Pain in joints of left hand: Secondary | ICD-10-CM

## 2015-05-22 DIAGNOSIS — S6292XA Unspecified fracture of left wrist and hand, initial encounter for closed fracture: Secondary | ICD-10-CM

## 2015-06-26 ENCOUNTER — Other Ambulatory Visit: Payer: Self-pay | Admitting: Internal Medicine

## 2015-08-11 ENCOUNTER — Other Ambulatory Visit: Payer: Self-pay | Admitting: Internal Medicine

## 2015-08-26 ENCOUNTER — Telehealth: Payer: Self-pay | Admitting: Internal Medicine

## 2015-08-26 ENCOUNTER — Other Ambulatory Visit: Payer: Self-pay | Admitting: Internal Medicine

## 2015-08-26 NOTE — Telephone Encounter (Signed)
Pt needs refill on ambien 10 mg #90 [facility]

## 2015-08-26 NOTE — Telephone Encounter (Signed)
Pt req refill zolpidem (AMBIEN) 10 MG tablet   Pt said she will wait at the pharmacy

## 2015-08-27 MED ORDER — ZOLPIDEM TARTRATE 10 MG PO TABS: ORAL_TABLET | ORAL | Status: DC

## 2015-08-27 NOTE — Telephone Encounter (Signed)
Rx called in 

## 2015-08-27 NOTE — Telephone Encounter (Signed)
Ok to RF x 1

## 2015-11-08 DIAGNOSIS — N766 Ulceration of vulva: Secondary | ICD-10-CM | POA: Diagnosis not present

## 2016-01-06 ENCOUNTER — Other Ambulatory Visit: Payer: Self-pay | Admitting: *Deleted

## 2016-01-06 MED ORDER — OMEPRAZOLE 40 MG PO CPDR: 40.0000 mg | DELAYED_RELEASE_CAPSULE | Freq: Every day | ORAL | Status: DC

## 2016-01-20 DIAGNOSIS — D485 Neoplasm of uncertain behavior of skin: Secondary | ICD-10-CM | POA: Diagnosis not present

## 2016-01-20 DIAGNOSIS — D1801 Hemangioma of skin and subcutaneous tissue: Secondary | ICD-10-CM | POA: Diagnosis not present

## 2016-01-20 DIAGNOSIS — C44319 Basal cell carcinoma of skin of other parts of face: Secondary | ICD-10-CM | POA: Diagnosis not present

## 2016-01-20 DIAGNOSIS — D492 Neoplasm of unspecified behavior of bone, soft tissue, and skin: Secondary | ICD-10-CM | POA: Diagnosis not present

## 2016-01-20 DIAGNOSIS — L308 Other specified dermatitis: Secondary | ICD-10-CM | POA: Diagnosis not present

## 2016-01-20 DIAGNOSIS — Z85828 Personal history of other malignant neoplasm of skin: Secondary | ICD-10-CM | POA: Diagnosis not present

## 2016-01-20 DIAGNOSIS — L821 Other seborrheic keratosis: Secondary | ICD-10-CM | POA: Diagnosis not present

## 2016-01-20 DIAGNOSIS — D225 Melanocytic nevi of trunk: Secondary | ICD-10-CM | POA: Diagnosis not present

## 2016-01-20 DIAGNOSIS — L812 Freckles: Secondary | ICD-10-CM | POA: Diagnosis not present

## 2016-01-22 ENCOUNTER — Emergency Department (HOSPITAL_COMMUNITY)
Admission: EM | Admit: 2016-01-22 | Discharge: 2016-01-22 | Disposition: A | Discharge status: Home / Self Care | Payer: PPO | Attending: Emergency Medicine | Admitting: Emergency Medicine

## 2016-01-22 ENCOUNTER — Encounter (HOSPITAL_COMMUNITY): Payer: Self-pay | Admitting: Emergency Medicine

## 2016-01-22 DIAGNOSIS — X58XXXA Exposure to other specified factors, initial encounter: Secondary | ICD-10-CM | POA: Insufficient documentation

## 2016-01-22 DIAGNOSIS — Y9389 Activity, other specified: Secondary | ICD-10-CM | POA: Insufficient documentation

## 2016-01-22 DIAGNOSIS — Y9289 Other specified places as the place of occurrence of the external cause: Secondary | ICD-10-CM | POA: Insufficient documentation

## 2016-01-22 DIAGNOSIS — S46911A Strain of unspecified muscle, fascia and tendon at shoulder and upper arm level, right arm, initial encounter: Secondary | ICD-10-CM | POA: Diagnosis not present

## 2016-01-22 DIAGNOSIS — Z79899 Other long term (current) drug therapy: Secondary | ICD-10-CM | POA: Diagnosis not present

## 2016-01-22 DIAGNOSIS — K219 Gastro-esophageal reflux disease without esophagitis: Secondary | ICD-10-CM | POA: Insufficient documentation

## 2016-01-22 DIAGNOSIS — Z880 Allergy status to penicillin: Secondary | ICD-10-CM | POA: Diagnosis not present

## 2016-01-22 DIAGNOSIS — S29002A Unspecified injury of muscle and tendon of back wall of thorax, initial encounter: Secondary | ICD-10-CM | POA: Diagnosis not present

## 2016-01-22 DIAGNOSIS — Z8639 Personal history of other endocrine, nutritional and metabolic disease: Secondary | ICD-10-CM | POA: Diagnosis not present

## 2016-01-22 DIAGNOSIS — Z85828 Personal history of other malignant neoplasm of skin: Secondary | ICD-10-CM | POA: Diagnosis not present

## 2016-01-22 DIAGNOSIS — Z8669 Personal history of other diseases of the nervous system and sense organs: Secondary | ICD-10-CM | POA: Insufficient documentation

## 2016-01-22 DIAGNOSIS — M25512 Pain in left shoulder: Secondary | ICD-10-CM | POA: Diagnosis not present

## 2016-01-22 DIAGNOSIS — Z872 Personal history of diseases of the skin and subcutaneous tissue: Secondary | ICD-10-CM | POA: Insufficient documentation

## 2016-01-22 DIAGNOSIS — Y998 Other external cause status: Secondary | ICD-10-CM | POA: Diagnosis not present

## 2016-01-22 DIAGNOSIS — S46812A Strain of other muscles, fascia and tendons at shoulder and upper arm level, left arm, initial encounter: Secondary | ICD-10-CM | POA: Diagnosis not present

## 2016-01-22 DIAGNOSIS — Z86010 Personal history of colonic polyps: Secondary | ICD-10-CM | POA: Diagnosis not present

## 2016-01-22 DIAGNOSIS — R4182 Altered mental status, unspecified: Secondary | ICD-10-CM | POA: Insufficient documentation

## 2016-01-22 LAB — COMPREHENSIVE METABOLIC PANEL
ALK PHOS: 66 U/L (ref 38–126)
ALT: 15 U/L (ref 14–54)
ANION GAP: 10 (ref 5–15)
AST: 18 U/L (ref 15–41)
Albumin: 4.4 g/dL (ref 3.5–5.0)
BUN: 14 mg/dL (ref 6–20)
CALCIUM: 9.4 mg/dL (ref 8.9–10.3)
CO2: 26 mmol/L (ref 22–32)
CREATININE: 0.58 mg/dL (ref 0.44–1.00)
Chloride: 106 mmol/L (ref 101–111)
Glucose, Bld: 115 mg/dL — ABNORMAL HIGH (ref 65–99)
Potassium: 3.8 mmol/L (ref 3.5–5.1)
SODIUM: 142 mmol/L (ref 135–145)
Total Bilirubin: 0.7 mg/dL (ref 0.3–1.2)
Total Protein: 7.6 g/dL (ref 6.5–8.1)

## 2016-01-22 LAB — CBC
HCT: 37 % (ref 36.0–46.0)
HEMOGLOBIN: 12.2 g/dL (ref 12.0–15.0)
MCH: 28.5 pg (ref 26.0–34.0)
MCHC: 33 g/dL (ref 30.0–36.0)
MCV: 86.4 fL (ref 78.0–100.0)
PLATELETS: 271 10*3/uL (ref 150–400)
RBC: 4.28 MIL/uL (ref 3.87–5.11)
RDW: 14.4 % (ref 11.5–15.5)
WBC: 13.9 10*3/uL — AB (ref 4.0–10.5)

## 2016-01-22 MED ORDER — IBUPROFEN 800 MG PO TABS
800.0000 mg | ORAL_TABLET | Freq: Once | ORAL | Status: AC
  Administered 2016-01-22: 800 mg via ORAL
  Filled 2016-01-22: qty 1

## 2016-01-22 MED ORDER — ACETAMINOPHEN 500 MG PO TABS
1000.0000 mg | ORAL_TABLET | Freq: Once | ORAL | Status: AC
  Administered 2016-01-22: 1000 mg via ORAL
  Filled 2016-01-22: qty 2

## 2016-01-22 NOTE — Discharge Instructions (Signed)
Take 4 over the counter ibuprofen tablets 3 times a day or 2 over-the-counter naproxen tablets twice a day for pain. Tylenol 1-2 tabs po q4h prn  Muscle Strain A muscle strain is an injury that occurs when a muscle is stretched beyond its normal length. Usually a small number of muscle fibers are torn when this happens. Muscle strain is rated in degrees. First-degree strains have the least amount of muscle fiber tearing and pain. Second-degree and third-degree strains have increasingly more tearing and pain.  Usually, recovery from muscle strain takes 1-2 weeks. Complete healing takes 5-6 weeks.  CAUSES  Muscle strain happens when a sudden, violent force placed on a muscle stretches it too far. This may occur with lifting, sports, or a fall.  RISK FACTORS Muscle strain is especially common in athletes.  SIGNS AND SYMPTOMS At the site of the muscle strain, there may be:  Pain.  Bruising.  Swelling.  Difficulty using the muscle due to pain or lack of normal function. DIAGNOSIS  Your health care provider will perform a physical exam and ask about your medical history. TREATMENT  Often, the best treatment for a muscle strain is resting, icing, and applying cold compresses to the injured area.  HOME CARE INSTRUCTIONS   Use the PRICE method of treatment to promote muscle healing during the first 2-3 days after your injury. The PRICE method involves:  Protecting the muscle from being injured again.  Restricting your activity and resting the injured body part.  Icing your injury. To do this, put ice in a plastic bag. Place a towel between your skin and the bag. Then, apply the ice and leave it on from 15-20 minutes each hour. After the third day, switch to moist heat packs.  Apply compression to the injured area with a splint or elastic bandage. Be careful not to wrap it too tightly. This may interfere with blood circulation or increase swelling.  Elevate the injured body part above the  level of your heart as often as you can.  Only take over-the-counter or prescription medicines for pain, discomfort, or fever as directed by your health care provider.  Warming up prior to exercise helps to prevent future muscle strains. SEEK MEDICAL CARE IF:   You have increasing pain or swelling in the injured area.  You have numbness, tingling, or a significant loss of strength in the injured area. MAKE SURE YOU:   Understand these instructions.  Will watch your condition.  Will get help right away if you are not doing well or get worse.   This information is not intended to replace advice given to you by your health care provider. Make sure you discuss any questions you have with your health care provider.   Document Released: 10/19/2005 Document Revised: 08/09/2013 Document Reviewed: 05/18/2013 Elsevier Interactive Patient Education [employer].

## 2016-01-22 NOTE — ED Notes (Signed)
Patient here from home with complaints of left arm pain and back pain that started this morning. Reports that when she woke up she was "really confused". States "It took me twice as long to get dressed." AAO x4. Ambulatory.

## 2016-01-22 NOTE — ED Provider Notes (Signed)
CSN: [id number]     Arrival date & time 01/22/16  1627 History   First MD Initiated Contact with Patient 01/22/16 1849     Chief Complaint  Patient presents with  . Arm Pain  . Altered Mental Status  . Back Pain     (Consider location/radiation/quality/duration/timing/severity/associated sxs/prior Treatment) Patient is a 75 y.o. female presenting with arm pain, altered mental status, and back pain. The history is provided by the patient.  Arm Pain This is a new problem. The current episode started yesterday. The problem occurs constantly. The problem has not changed since onset.Pertinent negatives include no chest pain, no abdominal pain, no headaches and no shortness of breath. Nothing aggravates the symptoms. Nothing relieves the symptoms. She has tried nothing for the symptoms. The treatment provided no relief.  Altered Mental Status Associated symptoms: no abdominal pain, no fever, no headaches, no nausea, no palpitations and no vomiting   Back Pain Associated symptoms: no abdominal pain, no chest pain, no dysuria, no fever and no headaches    75 yo F With a chief complaint of left arm pain. This been going on all day. Patient denies any new exercises or injury. Patient also has felt mildly confused all day. Feels like she's been a little slow. Denies any difficulty with speech, or difficulty moving her arms or legs. Patient denies loss coordination. Worked as a Theme park manager throughout the day. Denies any chest pain or shortness of breath. Denies lower extremity edema.  Past Medical History  Diagnosis Date  . Personal history of colonic polyps   . Contact dermatitis and other eczema, due to unspecified cause     History  . Unspecified disorder of thyroid   . Arthropathy, unspecified, site unspecified   . Pure hypercholesterolemia   . Unspecified hypothyroidism   . Other bursitis disorders     history  . PONV (postoperative nausea and vomiting)   . SVD (spontaneous vaginal  delivery)     x 1  . GERD (gastroesophageal reflux disease)     history - no current prob - no med  . 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 Arthritis     lower back shoulder  . Cancer ([place])     skin -spots on face removed   Past Surgical History  Procedure Laterality Date  . Tubal ligation    . Lumbar laminectomy    . Knee arthroscopy  10/21/2012    right - Procedure: ARTHROSCOPY KNEE;  Surgeon: Mcarthur Rossetti, MD;  Location: [facility];  Service: Orthopedics;  Laterality: Left;  Left Knee Arthroscopy, Debridement, partial synovectomy  . Devaited septum surgery    . Eye surgery      bilateral cataract surgery  . Blephorplasty       bilateral   . 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 Back surgery  2002    L4/L5  . Breast surgery      x 2 breast implants, later removed  . Wisdom tooth extraction    . Colonoscopy    . Robotic assisted supracervical hysterectomy with bilateral salpingo oopherectomy N/A 03/07/2014    Procedure: ROBOTIC ASSISTED SUPRACERVICAL HYSTERECTOMY WITH BILATERAL SALPINGO OOPHORECTOMY;  Surgeon: Princess Bruins, MD;  Location: [facility];  Service: Gynecology;  Laterality: N/A;  4 hrs.  . Robotic assisted laparoscopic sacrocolpopexy N/A 03/07/2014    Procedure: ROBOTIC ASSISTED LAPAROSCOPIC SACROCOLPOPEXY With Carrolyn Meiers;  Surgeon: Princess Bruins, MD;  Location: [facility];  Service: Gynecology;  Laterality: N/A;  . Cystocele repair N/A 03/07/2014    Procedure: ANTERIOR REPAIR (CYSTOCELE);  Surgeon: Princess Bruins, MD;  Location: [facility];  Service: Gynecology;  Laterality: N/A;  . Bladder suspension N/A 03/07/2014    Procedure: TRANSVAGINAL TAPE (TVT) PROCEDURE WITH CYSTO;  Surgeon: Princess Bruins, MD;  Location: [facility];  Service: Gynecology;   Laterality: N/A;  vaginal    Family History  Problem Relation Age of Onset  . Cancer Mother     breast and lung  . Stroke Father   . Heart attack Father   . Diabetes Sister   . Emphysema Maternal Aunt   . Colon cancer Neg Hx    Social History  Substance Use Topics  . Smoking status: Never Smoker   . Smokeless tobacco: Never Used  . Alcohol Use: Yes     Comment: rare   OB History    No data available     Review of Systems  Constitutional: Negative for fever and chills.  HENT: Negative for congestion and rhinorrhea.   Eyes: Negative for redness and visual disturbance.  Respiratory: Negative for shortness of breath and wheezing.   Cardiovascular: Negative for chest pain and palpitations.  Gastrointestinal: Negative for nausea, vomiting and abdominal pain.  Genitourinary: Negative for dysuria and urgency.  Musculoskeletal: Positive for back pain. Negative for myalgias and arthralgias.  Skin: Negative for pallor and wound.  Neurological: Negative for dizziness and headaches.      Allergies  Penicillins; Codeine; Lisinopril; and Percocet  Home Medications   Prior to Admission medications   Medication Sig Start Date End Date Taking? Authorizing Provider  alendronate (FOSAMAX) 70 MG tablet Take 1 tablet (70 mg total) by mouth every 7 (seven) days. Take with a full glass of water on an empty stomach. Patient taking differently: Take 70 mg by mouth every Saturday. Take with a full glass of water on an empty stomach. 02/11/15  Yes Doe-Hyun Kyra Searles, DO  ARMOUR THYROID 30 MG tablet take 1 tablet by mouth once daily 05/01/15  Yes Doe-Hyun R Shawna Orleans, DO  Cholecalciferol 5000 UNITS capsule Take 5,000 Units by mouth daily.   Yes Historical Provider, MD  Cyanocobalamin (VITAMIN B-12 PO) Take 5,000 mcg by mouth daily.   Yes Historical Provider, MD  KRILL OIL PO Take 1 tablet by mouth daily.    Yes Historical Provider, MD  omeprazole (PRILOSEC) 40 MG capsule Take 1 capsule (40 mg total) by  mouth daily. 01/06/16  Yes Doe-Hyun R Shawna Orleans, DO  VITAMIN E PO Take 1 tablet by mouth daily.   Yes Historical Provider, MD  zolpidem (AMBIEN) 10 MG tablet take 1 tablet by mouth at bedtime if needed Patient taking differently: Take 10 mg by mouth at bedtime as needed for sleep.  08/27/15  Yes Doe-Hyun R Yoo, DO   BP 168/113 mmHg  Pulse 78  Temp(Src) 98.6 F (37 C) (Oral)  Resp 16  SpO2 100% Physical Exam  Constitutional: She is oriented to person, place, and time. She appears well-developed and well-nourished. No distress.  HENT:  Head: Normocephalic and atraumatic.  Eyes: EOM are normal. Pupils are equal, round, and reactive to light.  Neck: Normal range of motion. Neck supple.  Cardiovascular: Normal rate and regular rhythm.  Exam reveals no gallop and no friction rub.  No murmur heard. Pulmonary/Chest: Effort normal. She has no wheezes. She has no rales.  Abdominal: Soft. She exhibits no distension. There is no tenderness.  Musculoskeletal: She exhibits no edema or tenderness.       Arms: Neurological: She is alert and oriented to person, place, and time. She has normal strength. No cranial nerve deficit or sensory deficit. She displays a negative Romberg sign. Coordination and gait normal. GCS eye subscore is 4. GCS verbal subscore is 5. GCS motor subscore is 6. She displays no Babinski's sign on the right side. She displays no Babinski's sign on the left side.  Reflex Scores:      Tricep reflexes are 2+ on the right side and 2+ on the left side.      Bicep reflexes are 2+ on the right side and 2+ on the left side.      Brachioradialis reflexes are 2+ on the right side and 2+ on the left side.      Patellar reflexes are 2+ on the right side and 2+ on the left side.      Achilles reflexes are 2+ on the right side and 2+ on the left side. Benign neuro exam  Skin: Skin is warm and dry. She is not diaphoretic.  Psychiatric: She has a normal mood and affect. Her behavior is normal.    Nursing note and vitals reviewed.   ED Course  Procedures (including critical care time) Labs Review Labs Reviewed  COMPREHENSIVE METABOLIC PANEL - Abnormal; Notable for the following:    Glucose, Bld 115 (*)    All other components within normal limits  CBC - Abnormal; Notable for the following:    WBC 13.9 (*)    All other components within normal limits  CBG MONITORING, ED    Imaging Review No results found. I have personally reviewed and evaluated these images and lab results as part of my medical decision-making.   EKG Interpretation   Date/Time:  Wednesday January 22 2016 19:45:26 EDT Ventricular Rate:  76 PR Interval:  210 QRS Duration: 89 QT Interval:  389 QTC Calculation: 437 R Axis:   42 Text Interpretation:  Sinus rhythm No significant change since last  tracing Confirmed by Vikas Wegmann MD, Quillian Quince [phone]) on 01/22/2016 7:49:10 PM      MDM   Final diagnoses:  Trapezius strain, left, initial encounter    75 yo F with a chief complaint of left arm pain. 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 Also has some vague confusion. Laboratory evaluation was unremarkable. Pain is reproduced with palpation of the trapezius. Feel this is extremely unlikely to be a MI or stroke and these are the patient's biggest worries. Feel that she can follow with her family doctor for outpatient MRI if need be.  7:49 PM:  I have discussed the diagnosis/risks/treatment options with the patient and believe the pt to be eligible for discharge home to follow-up with PCP. We also discussed returning to the ED immediately if new or worsening sx occur. We discussed the sx which are most concerning (e.g., sudden worsening pain, fever, inability to tolerate by mouth) that necessitate immediate return. Medications administered to the patient during their visit and any new prescriptions provided to the patient are listed below.  Medications given during this visit Medications   acetaminophen (TYLENOL) tablet 1,000 mg (1,000 mg Oral Given 01/22/16 1917)  ibuprofen (ADVIL,MOTRIN) tablet 800 mg (800 mg Oral Given 01/22/16 1917)    New  Prescriptions   No medications on file    The patient appears reasonably screen and/or stabilized for discharge and I doubt any other medical condition or other [facility] requiring further screening, evaluation, or treatment in the ED at this time prior to discharge.     Deno Etienne, DO 01/22/16 1949

## 2016-01-24 ENCOUNTER — Encounter: Payer: Self-pay | Admitting: Family Medicine

## 2016-01-24 ENCOUNTER — Ambulatory Visit (INDEPENDENT_AMBULATORY_CARE_PROVIDER_SITE_OTHER): Payer: PPO | Admitting: Family Medicine

## 2016-01-24 VITALS — BP 130/82 | HR 77 | Temp 98.4°F | Ht 59.5 in | Wt 141.0 lb

## 2016-01-24 DIAGNOSIS — M79602 Pain in left arm: Secondary | ICD-10-CM | POA: Diagnosis not present

## 2016-01-24 DIAGNOSIS — M5412 Radiculopathy, cervical region: Secondary | ICD-10-CM

## 2016-01-24 NOTE — Patient Instructions (Signed)

## 2016-01-24 NOTE — Progress Notes (Signed)
Subjective:    Patient ID: Julie [name], female    DOB: 1941/02/10, 75 y.o.   MRN: [id number]  HPI    Patient seen for ER follow-up.  ER records reviewed.    Onset 2 days ago of some left periscapular pains with radiation toward the left upper extremity.  She described a sharp pain left scapular region with a dull achy quality type pain down the left upper extremity from the shoulder all the way to the hand. No numbness. No weakness. Minimal neck pain. No chest pain.  Patient seen in ER. There was also report of possibly some mild cognitive slowing. No speech changes. No focal weakness. Patient had labs which were reviewed and mostly unremarkable. EKG sinus rhythm with no significant changes. Her neuro exam was nonfocal. No evidence to suggest stroke or acute coronary syndrome.   Patient has been taking some Advil and states that her pain is significantly improved. She also had some relief with arm sling. Her arm pain was worse when stretched out straight. She denies any prior history of cervical radiculopathy.  No recent injury. No dyspnea. No pleuritic pain.  Past Medical History  Diagnosis Date  . Personal history of colonic polyps   . Contact dermatitis and other eczema, due to unspecified cause     History  . Unspecified disorder of thyroid   . Arthropathy, unspecified, site unspecified   . Pure hypercholesterolemia   . Unspecified hypothyroidism   . Other bursitis disorders     history  . PONV (postoperative nausea and vomiting)   . SVD (spontaneous vaginal delivery)     x 1  . GERD (gastroesophageal reflux disease)     history - no current prob - no med  . 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 Arthritis     lower back shoulder  . Cancer ([place])     skin -spots on face removed   Past Surgical History  Procedure Laterality Date  . Tubal ligation    . Lumbar laminectomy    . Knee arthroscopy  10/21/2012    right -  Procedure: ARTHROSCOPY KNEE;  Surgeon: Mcarthur Rossetti, MD;  Location: [facility];  Service: Orthopedics;  Laterality: Left;  Left Knee Arthroscopy, Debridement, partial synovectomy  . Devaited septum surgery    . Eye surgery      bilateral cataract surgery  . Blephorplasty       bilateral   . 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 Back surgery  2002    L4/L5  . Breast surgery      x 2 breast implants, later removed  . Wisdom tooth extraction    . Colonoscopy    . Robotic assisted supracervical hysterectomy with bilateral salpingo oopherectomy N/A 03/07/2014    Procedure: ROBOTIC ASSISTED SUPRACERVICAL HYSTERECTOMY WITH BILATERAL SALPINGO OOPHORECTOMY;  Surgeon: Princess Bruins, MD;  Location: [facility];  Service: Gynecology;  Laterality: N/A;  4 hrs.  . Robotic assisted laparoscopic sacrocolpopexy N/A 03/07/2014    Procedure: ROBOTIC ASSISTED LAPAROSCOPIC SACROCOLPOPEXY With Carrolyn Meiers;  Surgeon: Princess Bruins, MD;  Location: [facility];  Service: Gynecology;  Laterality: N/A;  . Cystocele repair N/A 03/07/2014    Procedure: ANTERIOR REPAIR (CYSTOCELE);  Surgeon: Princess Bruins, MD;  Location: [facility];  Service: Gynecology;  Laterality: N/A;  . Bladder suspension N/A 03/07/2014    Procedure: TRANSVAGINAL TAPE (TVT) PROCEDURE WITH CYSTO;  Surgeon: Princess Bruins, MD;  Location: [facility];  Service: Gynecology;  Laterality: N/A;  vaginal     reports that she has never smoked. She has never used smokeless tobacco. She reports that she drinks alcohol. She reports that she does not use illicit drugs. family history includes Cancer in her mother; Diabetes in her sister; Emphysema in her maternal aunt; Heart attack in her father; Stroke in her father. There is no history of Colon cancer. Allergies  Allergen Reactions  . Penicillins  Anaphylaxis    REACTION: causes tongue to swell Has patient had a PCN reaction causing immediate rash, facial/tongue/throat swelling, SOB or lightheadedness with hypotension: Yes swelling Has patient had a PCN reaction causing severe rash involving mucus membranes or skin necrosis: No Has patient had a PCN reaction that required hospitalization no Has patient had a PCN reaction occurring within the last 10 years: no If all of the above answers are "NO", then may proceed with Cephalosporin use.   . Codeine Nausea Only  . Lisinopril     REACTION: wheezing  . Percocet [Oxycodone-Acetaminophen] Nausea And Vomiting      Review of Systems  Constitutional: Negative for appetite change.  Respiratory: Negative for cough and shortness of breath.   Cardiovascular: Negative for chest pain.  Gastrointestinal: Negative for abdominal pain.  Musculoskeletal: Negative for neck stiffness.  Skin: Negative for rash.  Neurological: Negative for weakness and numbness.       Objective:   Physical Exam  Constitutional: She appears well-developed and well-nourished.  Neck: Neck supple.  Cardiovascular: Normal rate and regular rhythm.   Pulmonary/Chest: Effort normal and breath sounds normal. No respiratory distress. She has no wheezes. She has no rales.  Musculoskeletal: She exhibits no edema.  Full range of motion left shoulder. Good range of motion neck. Mild pain with lateral bending to the right and left.  Lymphadenopathy:    She has no cervical adenopathy.  Neurological:  Symmetric reflexes upper extremity. No focal weakness. Normal sensory function to touch  Skin: No rash noted.          Assessment & Plan:  Left upper extremity pain. Patient also has some left periscapular pains. Suspect cervical radiculitis. Nonfocal exam neurologically. Pain improved using heat and ibuprofen. We've recommended observation at this time as symptoms are improving. If symptoms worsen or persist consider  cervical MRI.

## 2016-02-10 ENCOUNTER — Other Ambulatory Visit: Payer: Commercial Managed Care - HMO

## 2016-02-10 ENCOUNTER — Other Ambulatory Visit (INDEPENDENT_AMBULATORY_CARE_PROVIDER_SITE_OTHER): Payer: PPO

## 2016-02-10 DIAGNOSIS — Z0000 Encounter for general adult medical examination without abnormal findings: Secondary | ICD-10-CM

## 2016-02-10 LAB — LIPID PANEL
Cholesterol: 218 mg/dL — ABNORMAL HIGH (ref 0–200)
HDL: 42.4 mg/dL (ref >39.00)
LDL CALC: 153 mg/dL — AB (ref 0–99)
NONHDL: 175.48
Total CHOL/HDL Ratio: 5
Triglycerides: 111 mg/dL (ref 0.0–149.0)
VLDL: 22.2 mg/dL (ref 0.0–40.0)

## 2016-02-10 LAB — BASIC METABOLIC PANEL
BUN: 12 mg/dL (ref 6–23)
CALCIUM: 9.3 mg/dL (ref 8.4–10.5)
CO2: 28 mEq/L (ref 19–32)
CREATININE: 0.58 mg/dL (ref 0.40–1.20)
Chloride: 104 mEq/L (ref 96–112)
GFR: 107.34 mL/min (ref >60.00)
GLUCOSE: 121 mg/dL — AB (ref 70–99)
Potassium: 4 mEq/L (ref 3.5–5.1)
Sodium: 140 mEq/L (ref 135–145)

## 2016-02-10 LAB — CBC WITH DIFFERENTIAL/PLATELET
BASOS ABS: 0 10*3/uL (ref 0.0–0.1)
Basophils Relative: 0.2 % (ref 0.0–3.0)
EOS ABS: 0.1 10*3/uL (ref 0.0–0.7)
Eosinophils Relative: 1.6 % (ref 0.0–5.0)
HCT: 36.6 % (ref 36.0–46.0)
HEMOGLOBIN: 12.3 g/dL (ref 12.0–15.0)
LYMPHS ABS: 1.7 10*3/uL (ref 0.7–4.0)
Lymphocytes Relative: 21.6 % (ref 12.0–46.0)
MCHC: 33.5 g/dL (ref 30.0–36.0)
MCV: 86.3 fl (ref 78.0–100.0)
MONO ABS: 0.4 10*3/uL (ref 0.1–1.0)
Monocytes Relative: 5.4 % (ref 3.0–12.0)
NEUTROS PCT: 71.2 % (ref 43.0–77.0)
Neutro Abs: 5.6 10*3/uL (ref 1.4–7.7)
Platelets: 276 10*3/uL (ref 150.0–400.0)
RBC: 4.24 Mil/uL (ref 3.87–5.11)
RDW: 14.8 % (ref 11.5–15.5)
WBC: 7.8 10*3/uL (ref 4.0–10.5)

## 2016-02-10 LAB — HEPATIC FUNCTION PANEL
ALT: 14 U/L (ref 0–35)
AST: 15 U/L (ref 0–37)
Albumin: 4.1 g/dL (ref 3.5–5.2)
Alkaline Phosphatase: 65 U/L (ref 39–117)
BILIRUBIN DIRECT: 0.1 mg/dL (ref 0.0–0.3)
BILIRUBIN TOTAL: 0.6 mg/dL (ref 0.2–1.2)
Total Protein: 6.8 g/dL (ref 6.0–8.3)

## 2016-02-10 LAB — TSH: TSH: 1.18 u[IU]/mL (ref 0.35–4.50)

## 2016-02-17 ENCOUNTER — Encounter: Payer: Commercial Managed Care - HMO | Admitting: Family Medicine

## 2016-02-17 ENCOUNTER — Encounter: Payer: Commercial Managed Care - HMO | Admitting: Internal Medicine

## 2016-02-17 DIAGNOSIS — Z85828 Personal history of other malignant neoplasm of skin: Secondary | ICD-10-CM | POA: Diagnosis not present

## 2016-02-17 DIAGNOSIS — C44319 Basal cell carcinoma of skin of other parts of face: Secondary | ICD-10-CM | POA: Diagnosis not present

## 2016-02-24 ENCOUNTER — Ambulatory Visit (INDEPENDENT_AMBULATORY_CARE_PROVIDER_SITE_OTHER): Payer: PPO | Admitting: Family Medicine

## 2016-02-24 VITALS — BP 128/84 | HR 72 | Temp 97.9°F | Ht 59.0 in | Wt 138.7 lb

## 2016-02-24 DIAGNOSIS — Z0000 Encounter for general adult medical examination without abnormal findings: Secondary | ICD-10-CM | POA: Diagnosis not present

## 2016-02-24 DIAGNOSIS — Z23 Encounter for immunization: Secondary | ICD-10-CM | POA: Diagnosis not present

## 2016-02-24 DIAGNOSIS — F4321 Adjustment disorder with depressed mood: Secondary | ICD-10-CM

## 2016-02-24 MED ORDER — FLUOXETINE HCL 20 MG PO TABS: 20.0000 mg | ORAL_TABLET | Freq: Every day | ORAL | Status: DC

## 2016-02-24 NOTE — Patient Instructions (Signed)
Consider tetanus booster and shingles vaccine at some point later this year. Fat and Cholesterol Restricted Diet High levels of fat and cholesterol in your blood may lead to various health problems, such as diseases of the heart, blood vessels, gallbladder, liver, and pancreas. Fats are concentrated sources of energy that come in various forms. Certain types of fat, including saturated fat, may be harmful in excess. Cholesterol is a substance needed by your body in small amounts. Your body makes all the cholesterol it needs. Excess cholesterol comes from the food you eat. When you have high levels of cholesterol and saturated fat in your blood, health problems can develop because the excess fat and cholesterol will gather along the walls of your blood vessels, causing them to narrow. Choosing the right foods will help you control your intake of fat and cholesterol. This will help keep the levels of these substances in your blood within normal limits and reduce your risk of disease. WHAT IS MY PLAN? Your health care provider recommends that you:  Get no more than __________ % of the total calories in your daily diet from fat.  Limit your intake of saturated fat to less than ______% of your total calories each day.  Limit the amount of cholesterol in your diet to less than _________mg per day. WHAT TYPES OF FAT SHOULD I CHOOSE?  Choose healthy fats more often. Choose monounsaturated and polyunsaturated fats, such as olive and canola oil, flaxseeds, walnuts, almonds, and seeds.  Eat more omega-3 fats. Good choices include salmon, mackerel, sardines, tuna, flaxseed oil, and ground flaxseeds. Aim to eat fish at least two times a week.  Limit saturated fats. Saturated fats are primarily found in animal products, such as meats, butter, and cream. Plant sources of saturated fats include palm oil, palm kernel oil, and coconut oil.  Avoid foods with partially hydrogenated oils in them. These contain trans  fats. Examples of foods that contain trans fats are stick margarine, some tub margarines, cookies, crackers, and other baked goods. WHAT GENERAL GUIDELINES DO I NEED TO FOLLOW? These guidelines for healthy eating will help you control your intake of fat and cholesterol:  Check food labels carefully to identify foods with trans fats or high amounts of saturated fat.  Fill one half of your plate with vegetables and green salads.  Fill one fourth of your plate with whole grains. Look for the word "whole" as the first word in the ingredient list.  Fill one fourth of your plate with lean protein foods.  Limit fruit to two servings a day. Choose fruit instead of juice.  Eat more foods that contain soluble fiber. Examples of foods that contain this type of fiber are apples, broccoli, carrots, beans, peas, and barley. Aim to get 20-30 g of fiber per day.  Eat more home-cooked food and less restaurant, buffet, and fast food.  Limit or avoid alcohol.  Limit foods high in starch and sugar.  Limit fried foods.  Cook foods using methods other than frying. Baking, boiling, grilling, and broiling are all great options.  Lose weight if you are overweight. Losing just 5-10% of your initial body weight can help your overall health and prevent diseases such as diabetes and heart disease. WHAT FOODS CAN I EAT? Grains Whole grains, such as whole wheat or whole grain breads, crackers, cereals, and pasta. Unsweetened oatmeal, bulgur, barley, quinoa, or brown rice. Corn or whole wheat flour tortillas. Vegetables Fresh or frozen vegetables (raw, steamed, roasted, or grilled). Green salads.  Fruits All fresh, canned (in natural juice), or frozen fruits. Meat and Other Protein Products Ground beef (85% or leaner), grass-fed beef, or beef trimmed of fat. Skinless chicken or [place]. Ground chicken or [place]. Pork trimmed of fat. All fish and seafood. Eggs. Dried beans, peas, or lentils. Unsalted nuts or seeds.  Unsalted canned or dry beans. Dairy Low-fat dairy products, such as skim or 1% milk, 2% or reduced-fat cheeses, low-fat ricotta or cottage cheese, or plain low-fat yogurt. Fats and Oils Tub margarines without trans fats. Light or reduced-fat mayonnaise and salad dressings. Avocado. Olive, canola, sesame, or safflower oils. Natural peanut or almond butter (choose ones without added sugar and oil). The items listed above may not be a complete list of recommended foods or beverages. Contact your dietitian for more options. WHAT FOODS ARE NOT RECOMMENDED? Grains White bread. White pasta. White rice. Cornbread. Bagels, pastries, and croissants. Crackers that contain trans fat. Vegetables White potatoes. Corn. Creamed or fried vegetables. Vegetables in a cheese sauce. Fruits Dried fruits. Canned fruit in light or heavy syrup. Fruit juice. Meat and Other Protein Products Fatty cuts of meat. Ribs, chicken wings, bacon, sausage, bologna, salami, chitterlings, fatback, hot dogs, bratwurst, and packaged luncheon meats. Liver and organ meats. Dairy Whole or 2% milk, cream, half-and-half, and cream cheese. Whole milk cheeses. Whole-fat or sweetened yogurt. Full-fat cheeses. Nondairy creamers and whipped toppings. Processed cheese, cheese spreads, or cheese curds. Sweets and Desserts Corn syrup, sugars, honey, and molasses. Candy. Jam and jelly. Syrup. Sweetened cereals. Cookies, pies, cakes, donuts, muffins, and ice cream. Fats and Oils Butter, stick margarine, lard, shortening, ghee, or bacon fat. Coconut, palm kernel, or palm oils. Beverages Alcohol. Sweetened drinks (such as sodas, lemonade, and fruit drinks or punches). The items listed above may not be a complete list of foods and beverages to avoid. Contact your dietitian for more information.   This information is not intended to replace advice given to you by your health care provider. Make sure you discuss any questions you have with your health  care provider.   Document Released: 10/19/2005 Document Revised: 11/09/2014 Document Reviewed: 01/17/2014 Elsevier Interactive Patient Education 2016 [place]. Hyperglycemia Hyperglycemia occurs when the glucose (sugar) in your blood is too high. Hyperglycemia can happen for many reasons, but it most often happens to people who do not know they have diabetes or are not managing their diabetes properly.  CAUSES  Whether you have diabetes or not, there are other causes of hyperglycemia. Hyperglycemia can occur when you have diabetes, but it can also occur in other situations that you might not be as aware of, such as: Diabetes  If you have diabetes and are having problems controlling your blood glucose, hyperglycemia could occur because of some of the following reasons:  Not following your meal plan.  Not taking your diabetes medications or not taking it properly.  Exercising less or doing less activity than you normally do.  Being sick. Pre-diabetes  This cannot be ignored. Before people develop Type 2 diabetes, they almost always have "pre-diabetes." This is when your blood glucose levels are higher than normal, but not yet high enough to be diagnosed as diabetes. Research has shown that some long-term damage to the body, especially the heart and circulatory system, may already be occurring during pre-diabetes. If you take action to manage your blood glucose when you have pre-diabetes, you may delay or prevent Type 2 diabetes from developing. Stress  If you have diabetes, you may be "diet" controlled  or on oral medications or insulin to control your diabetes. However, you may find that your blood glucose is higher than usual in the hospital whether you have diabetes or not. This is often referred to as "stress hyperglycemia." Stress can elevate your blood glucose. This happens because of hormones put out by the body during times of stress. If stress has been the cause of your high blood  glucose, it can be followed regularly by your caregiver. That way he/she can make sure your hyperglycemia does not continue to get worse or progress to diabetes. Steroids  Steroids are medications that act on the infection fighting system (immune system) to block inflammation or infection. One side effect can be a rise in blood glucose. Most people can produce enough extra insulin to allow for this rise, but for those who cannot, steroids make blood glucose levels go even higher. It is not unusual for steroid treatments to "uncover" diabetes that is developing. It is not always possible to determine if the hyperglycemia will go away after the steroids are stopped. A special blood test called an A1c is sometimes done to determine if your blood glucose was elevated before the steroids were started. SYMPTOMS  Thirsty.  Frequent urination.  Dry mouth.  Blurred vision.  Tired or fatigue.  Weakness.  Sleepy.  Tingling in feet or leg. DIAGNOSIS  Diagnosis is made by monitoring blood glucose in one or all of the following ways:  A1c test. This is a chemical found in your blood.  Fingerstick blood glucose monitoring.  Laboratory results. TREATMENT  First, knowing the cause of the hyperglycemia is important before the hyperglycemia can be treated. Treatment may include, but is not be limited to:  Education.  Change or adjustment in medications.  Change or adjustment in meal plan.  Treatment for an illness, infection, etc.  More frequent blood glucose monitoring.  Change in exercise plan.  Decreasing or stopping steroids.  Lifestyle changes. HOME CARE INSTRUCTIONS   Test your blood glucose as directed.  Exercise regularly. Your caregiver will give you instructions about exercise. Pre-diabetes or diabetes which comes on with stress is helped by exercising.  Eat wholesome, balanced meals. Eat often and at regular, fixed times. Your caregiver or nutritionist will give you a  meal plan to guide your sugar intake.  Being at an ideal weight is important. If needed, losing as little as 10 to 15 pounds may help improve blood glucose levels. SEEK MEDICAL CARE IF:   You have questions about medicine, activity, or diet.  You continue to have symptoms (problems such as increased thirst, urination, or weight gain). SEEK IMMEDIATE MEDICAL CARE IF:   You are vomiting or have diarrhea.  Your breath smells fruity.  You are breathing faster or slower.  You are very sleepy or incoherent.  You have numbness, tingling, or pain in your feet or hands.  You have chest pain.  Your symptoms get worse even though you have been following your caregiver's orders.  If you have any other questions or concerns.   This information is not intended to replace advice given to you by your health care provider. Make sure you discuss any questions you have with your health care provider.   Document Released: 04/14/2001 Document Revised: 01/11/2012 Document Reviewed: 06/25/2015 Elsevier Interactive Patient Education [employer].

## 2016-02-24 NOTE — Progress Notes (Signed)
Subjective:    Patient ID: Julie [name], female    DOB: 1941-09-08, 75 y.o.   MRN: [id number]  HPI Patient here for physical exam and separate issue below. Her chronic problems include history of hypothyroidism, prediabetes, osteoporosis, dyslipidemia, hypertension.    She takes Fosamax for osteoporosis. Also takes calcium and vitamin D. DEXA scan last year. Plans to set up mammogram this year. She is on low-dose thyroid replacement with [facility].  She's had some chronic insomnia and takes as needed Ambien.  Sister passed away last 10-21-2023. Patient has had tremendous difficulties coping since then. She has almost daily crying spells. Low motivation. She's had poor compliance with diet which she attributes to overeating out of her depression. Some sleep disturbance. No suicidal ideation. Has not had any counseling. Denies any prior treatment for depression.  Has had prior 23 valent Pneumovax but no history of Prevnar 13. No history of shingles vaccine and last tetanus unknown. Colonoscopy up-to-date.  Past Medical History  Diagnosis Date  . Personal history of colonic polyps   . Contact dermatitis and other eczema, due to unspecified cause     History  . Unspecified disorder of thyroid   . Arthropathy, unspecified, site unspecified   . Pure hypercholesterolemia   . Unspecified hypothyroidism   . Other bursitis disorders     history  . PONV (postoperative nausea and vomiting)   . SVD (spontaneous vaginal delivery)     x 1  . GERD (gastroesophageal reflux disease)     history - no current prob - no med  . 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 Arthritis     lower back shoulder  . 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 Tubal ligation    . Lumbar laminectomy    . Knee arthroscopy  10/21/2012    right - Procedure: ARTHROSCOPY KNEE;  Surgeon: Mcarthur Rossetti, MD;  Location: [facility];  Service: Orthopedics;  Laterality: Left;  Left Knee Arthroscopy, Debridement, partial synovectomy  . Devaited septum surgery    . Eye surgery      bilateral cataract surgery  . Blephorplasty       bilateral   . 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 Back surgery  2002    L4/L5  . Breast surgery      x 2 breast implants, later removed  . Wisdom tooth extraction    . Colonoscopy    . Robotic assisted supracervical hysterectomy with bilateral salpingo oopherectomy N/A 03/07/2014    Procedure: ROBOTIC ASSISTED SUPRACERVICAL HYSTERECTOMY WITH BILATERAL SALPINGO OOPHORECTOMY;  Surgeon: Princess Bruins, MD;  Location: [facility];  Service: Gynecology;  Laterality: N/A;  4 hrs.  . Robotic assisted laparoscopic sacrocolpopexy N/A 03/07/2014    Procedure: ROBOTIC ASSISTED LAPAROSCOPIC SACROCOLPOPEXY With Carrolyn Meiers;  Surgeon: Princess Bruins, MD;  Location: [facility];  Service: Gynecology;  Laterality: N/A;  . Cystocele repair N/A 03/07/2014    Procedure: ANTERIOR REPAIR (CYSTOCELE);  Surgeon: Princess Bruins, MD;  Location: [facility];  Service: Gynecology;  Laterality: N/A;  . Bladder suspension N/A 03/07/2014    Procedure: TRANSVAGINAL TAPE (TVT) PROCEDURE WITH CYSTO;  Surgeon: Princess Bruins, MD;  Location:  [facility];  Service: Gynecology;  Laterality: N/A;  vaginal     reports that she has never smoked. She has never used smokeless tobacco. She reports that she drinks alcohol. She reports that she does not use illicit drugs. family history includes Cancer in her mother; Diabetes in her sister; Emphysema in her maternal aunt; Heart attack in her father; Stroke in her father. There is no history of Colon cancer. Allergies  Allergen Reactions  . Penicillins Anaphylaxis      Review of Systems    Constitutional: Positive for fatigue. Negative for fever, activity change, appetite change and unexpected weight change.  HENT: Negative for ear pain, hearing loss, sore throat and trouble swallowing.   Eyes: Negative for visual disturbance.  Respiratory: Negative for cough and shortness of breath.   Cardiovascular: Negative for chest pain and palpitations.  Gastrointestinal: Negative for vomiting, abdominal pain, diarrhea, constipation and blood in stool.  Endocrine: Negative for polydipsia and polyuria.  Genitourinary: Negative for dysuria and hematuria.  Musculoskeletal: Negative for myalgias, back pain and arthralgias.  Skin: Negative for rash.  Neurological: Negative for dizziness, syncope and headaches.  Hematological: Negative for adenopathy.  Psychiatric/Behavioral: Positive for sleep disturbance, dysphoric mood and decreased concentration. Negative for suicidal ideas, confusion and agitation.       Objective:   Physical Exam  Constitutional: She is oriented to person, place, and time. She appears well-developed and well-nourished.  HENT:  Head: Normocephalic.  Right Ear: External ear normal.  Left Ear: External ear normal.  Mouth/Throat: Oropharynx is clear and moist.  Neck: Neck supple. No thyromegaly present.  Cardiovascular: Normal rate and regular rhythm.  Exam reveals no gallop.   Pulmonary/Chest: Effort normal and breath sounds normal. No respiratory distress. She has no wheezes. She has no rales.  Abdominal: Soft. Bowel sounds are normal. She exhibits no distension and no mass. There is no tenderness. There is no rebound and no guarding.  Musculoskeletal: She exhibits no edema.  Lymphadenopathy:    She has no cervical adenopathy.  Neurological: She is alert and oriented to person, place, and time. No cranial nerve deficit.  Skin: No rash noted.  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  Patient has depressed mood and is crying intermittently during interview          Assessment & Plan:  #1 health maintenance. Prevnar 13 given. She is advised to get tetanus booster but she wishes to wait. We also recommend check on coverage for shingles vaccine. She will set up repeat mammogram. DEXA scan next year. Continue daily calcium and vitamin D. Labs reviewed. 10 year risk of CAD event 17.6%. She is reluctant to take statin at this time. She will try to tighten up diet with information given and try to lose some weight and repeat fasting glucose, hemoglobin A1c, and lipids in 6 months  #2 adjustment disorder with depressed mood. We offered counseling and she declines. She's had several months of difficulty with depressive symptoms with loss of her sister. Start fluoxetine 20 mg once daily. Reassess in 3 weeks.  Eulas Post MD [facility]

## 2016-02-24 NOTE — Progress Notes (Signed)
Pre visit review using our clinic review tool, if applicable. No additional management support is needed unless otherwise documented below in the visit note. 

## 2016-02-26 ENCOUNTER — Encounter: Payer: Commercial Managed Care - HMO | Admitting: Internal Medicine

## 2016-03-04 ENCOUNTER — Other Ambulatory Visit: Payer: Self-pay | Admitting: Family Medicine

## 2016-03-04 MED ORDER — ALENDRONATE SODIUM 70 MG PO TABS: 70.0000 mg | ORAL_TABLET | ORAL | Status: DC

## 2016-03-16 ENCOUNTER — Ambulatory Visit (INDEPENDENT_AMBULATORY_CARE_PROVIDER_SITE_OTHER): Payer: PPO | Admitting: Family Medicine

## 2016-03-16 VITALS — BP 120/78 | HR 73 | Temp 97.7°F | Ht 59.0 in | Wt 137.0 lb

## 2016-03-16 DIAGNOSIS — R109 Unspecified abdominal pain: Secondary | ICD-10-CM | POA: Diagnosis not present

## 2016-03-16 DIAGNOSIS — F4321 Adjustment disorder with depressed mood: Secondary | ICD-10-CM | POA: Diagnosis not present

## 2016-03-16 DIAGNOSIS — G4700 Insomnia, unspecified: Secondary | ICD-10-CM

## 2016-03-16 DIAGNOSIS — F5104 Psychophysiologic insomnia: Secondary | ICD-10-CM

## 2016-03-16 NOTE — Progress Notes (Signed)
Subjective:    Patient ID: Julie [name], female    DOB: [birth year], 75 y.o.   MRN: [id number]  HPI Follow-up major depressive episode Refer to recent note. She lost her sister recently. She was having daily crying spells and decreased motivation. We started fluoxetine 20 mg daily. She has seen great improvement. Fewer crying spells. Increased motivation. No side effects from medication. No suicidal ideation  Chronic insomnia. Takes Ambien frequently. Rare alcohol use. No late day use of caffeine. Tries to avoid TV,computer, etc late at night.  Recent right flank pain. Onset last Thursday. No injury. No dysuria. No hematuria. No fevers or chills. She describes some mild soreness. Worse with movement. No skin rash. Pain is relatively mild.  Past Medical History  Diagnosis Date  . Personal history of colonic polyps   . Contact dermatitis and other eczema, due to unspecified cause     History  . Unspecified disorder of thyroid   . Arthropathy, unspecified, site unspecified   . Pure hypercholesterolemia   . Unspecified hypothyroidism   . Other bursitis disorders     history  . PONV (postoperative nausea and vomiting)   . SVD (spontaneous vaginal delivery)     x 1  . GERD (gastroesophageal reflux disease)     history - no current prob - no med  . 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 Arthritis     lower back shoulder  . Cancer ([place])     skin -spots on face removed   Past Surgical History  Procedure Laterality Date  . Tubal ligation    . Lumbar laminectomy    . Knee arthroscopy  10/21/2012    right - Procedure: ARTHROSCOPY KNEE;  Surgeon: Mcarthur Rossetti, MD;  Location: [facility];  Service: Orthopedics;  Laterality: Left;  Left Knee Arthroscopy, Debridement, partial synovectomy  . Devaited septum surgery    . Eye surgery      bilateral cataract surgery  . Blephorplasty       bilateral   . Total knee  arthroplasty Left 12/30/2012    Procedure: TOTAL KNEE ARTHROPLASTY;  Surgeon: Mcarthur Rossetti, MD;  Location: [facility];  Service: Orthopedics;  Laterality: Left;  Left Total Knee Arthroplasty  . Back surgery  2002    L4/L5  . Breast surgery      x 2 breast implants, later removed  . Wisdom tooth extraction    . Colonoscopy    . Robotic assisted supracervical hysterectomy with bilateral salpingo oopherectomy N/A 03/07/2014    Procedure: ROBOTIC ASSISTED SUPRACERVICAL HYSTERECTOMY WITH BILATERAL SALPINGO OOPHORECTOMY;  Surgeon: Princess Bruins, MD;  Location: [facility];  Service: Gynecology;  Laterality: N/A;  4 hrs.  . Robotic assisted laparoscopic sacrocolpopexy N/A 03/07/2014    Procedure: ROBOTIC ASSISTED LAPAROSCOPIC SACROCOLPOPEXY With Carrolyn Meiers;  Surgeon: Princess Bruins, MD;  Location: [facility];  Service: Gynecology;  Laterality: N/A;  . Cystocele repair N/A 03/07/2014    Procedure: ANTERIOR REPAIR (CYSTOCELE);  Surgeon: Princess Bruins, MD;  Location: [facility];  Service: Gynecology;  Laterality: N/A;  . Bladder suspension N/A 03/07/2014    Procedure: TRANSVAGINAL TAPE (TVT) PROCEDURE WITH CYSTO;  Surgeon: Princess Bruins, MD;  Location: [facility];  Service: Gynecology;  Laterality: N/A;  vaginal     reports that she has never smoked. She has never used smokeless tobacco. She reports that she drinks alcohol. She reports that she does not use illicit drugs.  family history includes Cancer in her mother; Diabetes in her sister; Emphysema in her maternal aunt; Heart attack in her father; Stroke in her father. There is no history of Colon cancer. Allergies  Allergen Reactions      Review of Systems  Constitutional: Negative for fever and chills.  Gastrointestinal: Negative for nausea, vomiting and abdominal pain.  Genitourinary: Negative for dysuria and hematuria.  Musculoskeletal: Positive for back pain.  Skin: Negative for rash.  Psychiatric/Behavioral: Positive for sleep disturbance.  Negative for suicidal ideas. The patient is not nervous/anxious.        Objective:   Physical Exam  Constitutional: She appears well-developed and well-nourished. No distress.  Neck: Neck supple. No thyromegaly present.  Cardiovascular: Normal rate and regular rhythm.   Pulmonary/Chest: Effort normal and breath sounds normal. No respiratory distress. She has no wheezes. She has no rales.  Musculoskeletal: She exhibits no edema.  Lymphadenopathy:    She has no cervical adenopathy.  Skin: No rash noted.  Psychiatric: She has a normal mood and affect. Her behavior is normal. Judgment and thought content normal.          Assessment & Plan:  #1 adjustment disorder with depressed mood. Improved on Prozac. Continue minimum of 4-6 months of treatment. Reassess in 4 months time. She is very pleased with response thus far. We again offered counseling but she declines  #2 chronic insomnia. Sleep hygiene discussed with handout given. Try to avoid regular use of Ambien if possible  #3 right flank pain. Suspect musculoskeletal. Symptoms worse with movement. She'll try some over-the-counter Tylenol or ibuprofen. Touch base in one week if not resolving  Eulas Post MD [facility] at [facility]

## 2016-03-16 NOTE — Progress Notes (Signed)
Pre visit review using our clinic review tool, if applicable. No additional management support is needed unless otherwise documented below in the visit note. 

## 2016-03-16 NOTE — Patient Instructions (Signed)
Insomnia Insomnia is a sleep disorder that makes it difficult to fall asleep or to stay asleep. Insomnia can cause tiredness (fatigue), low energy, difficulty concentrating, mood swings, and poor performance at work or school.  There are three different ways to classify insomnia:  Difficulty falling asleep.  Difficulty staying asleep.  Waking up too early in the morning. Any type of insomnia can be long-term (chronic) or short-term (acute). Both are common. Short-term insomnia usually lasts for three months or less. Chronic insomnia occurs at least three times a week for longer than three months. CAUSES  Insomnia may be caused by another condition, situation, or substance, such as:  Anxiety.  Certain medicines.  Gastroesophageal reflux disease (GERD) or other gastrointestinal conditions.  Asthma or other breathing conditions.  Restless legs syndrome, sleep apnea, or other sleep disorders.  Chronic pain.  Menopause. This may include hot flashes.  Stroke.  Abuse of alcohol, tobacco, or illegal drugs.  Depression.  Caffeine.   Neurological disorders, such as Alzheimer disease.  An overactive thyroid (hyperthyroidism). The cause of insomnia may not be known. RISK FACTORS Risk factors for insomnia include:  Gender. Women are more commonly affected than men.  Age. Insomnia is more common as you get older.  Stress. This may involve your professional or personal life.  Income. Insomnia is more common in people with lower income.  Lack of exercise.   Irregular work schedule or night shifts.  Traveling between different time zones. SIGNS AND SYMPTOMS If you have insomnia, trouble falling asleep or trouble staying asleep is the main symptom. This may lead to other symptoms, such as:  Feeling fatigued.  Feeling nervous about going to sleep.  Not feeling rested in the morning.  Having trouble concentrating.  Feeling irritable, anxious, or depressed. TREATMENT   Treatment for insomnia depends on the cause. If your insomnia is caused by an underlying condition, treatment will focus on addressing the condition. Treatment may also include:   Medicines to help you sleep.  Counseling or therapy.  Lifestyle adjustments. HOME CARE INSTRUCTIONS   Take medicines only as directed by your health care provider.  Keep regular sleeping and waking hours. Avoid naps.  Keep a sleep diary to help you and your health care provider figure out what could be causing your insomnia. Include:   When you sleep.  When you wake up during the night.  How well you sleep.   How rested you feel the next day.  Any side effects of medicines you are taking.  What you eat and drink.   Make your bedroom a comfortable place where it is easy to fall asleep:  Put up shades or special blackout curtains to block light from outside.  Use a white noise machine to block noise.  Keep the temperature cool.   Exercise regularly as directed by your health care provider. Avoid exercising right before bedtime.  Use relaxation techniques to manage stress. Ask your health care provider to suggest some techniques that may work well for you. These may include:  Breathing exercises.  Routines to release muscle tension.  Visualizing peaceful scenes.  Cut back on alcohol, caffeinated beverages, and cigarettes, especially close to bedtime. These can disrupt your sleep.  Do not overeat or eat spicy foods right before bedtime. This can lead to digestive discomfort that can make it hard for you to sleep.  Limit screen use before bedtime. This includes:  Watching TV.  Using your smartphone, tablet, and computer.  Stick to a routine. This   can help you fall asleep faster. Try to do a quiet activity, brush your teeth, and go to bed at the same time each night.  Get out of bed if you are still awake after 15 minutes of trying to sleep. Keep the lights down, but try reading or  doing a quiet activity. When you feel sleepy, go back to bed.  Make sure that you drive carefully. Avoid driving if you feel very sleepy.  Keep all follow-up appointments as directed by your health care provider. This is important. SEEK MEDICAL CARE IF:   You are tired throughout the day or have trouble in your daily routine due to sleepiness.  You continue to have sleep problems or your sleep problems get worse. SEEK IMMEDIATE MEDICAL CARE IF:   You have serious thoughts about hurting yourself or someone else.   This information is not intended to replace advice given to you by your health care provider. Make sure you discuss any questions you have with your health care provider.   Document Released: 10/16/2000 Document Revised: 07/10/2015 Document Reviewed: 07/20/2014 Elsevier Interactive Patient Education 2016 [employer]  

## 2016-03-31 ENCOUNTER — Telehealth: Payer: Self-pay | Admitting: *Deleted

## 2016-03-31 ENCOUNTER — Other Ambulatory Visit: Payer: Self-pay | Admitting: *Deleted

## 2016-03-31 NOTE — Telephone Encounter (Signed)
[facility] faxed a refill request for Zolpidem Tartrate 10mg -take one at bedtime.

## 2016-03-31 NOTE — Telephone Encounter (Signed)
[facility] requests a refill request for Zolpidem Tartrate 10mg -take one by mouth at bedtime if needed

## 2016-04-01 ENCOUNTER — Other Ambulatory Visit: Payer: Self-pay

## 2016-04-01 NOTE — Telephone Encounter (Signed)
Pt is requesting a refill. Last filled on 08/27/15, last OV 03/16/2016. Okay to refill?

## 2016-04-01 NOTE — Telephone Encounter (Signed)
OK 

## 2016-04-01 NOTE — Telephone Encounter (Signed)
Refill OK

## 2016-04-01 NOTE — Telephone Encounter (Signed)
Dr. Elease Hashimoto, okay to refill pt's Zolpidem?

## 2016-04-01 NOTE — Telephone Encounter (Signed)
Pt calling back for refill

## 2016-04-02 MED ORDER — ZOLPIDEM TARTRATE 10 MG PO TABS: ORAL_TABLET | ORAL | Status: DC

## 2016-04-02 NOTE — Telephone Encounter (Signed)
Spoke to pt, told her Rx called into pharmacy for Zolpidem. Pt verbalized understanding.

## 2016-04-08 ENCOUNTER — Other Ambulatory Visit: Payer: Self-pay | Admitting: *Deleted

## 2016-04-08 MED ORDER — THYROID 30 MG PO TABS
30.0000 mg | ORAL_TABLET | Freq: Every day | ORAL | Status: DC
Start: 2016-04-08 — End: 2016-10-07

## 2016-04-18 DIAGNOSIS — M79651 Pain in right thigh: Secondary | ICD-10-CM | POA: Diagnosis not present

## 2016-06-22 ENCOUNTER — Other Ambulatory Visit: Payer: Self-pay | Admitting: Family Medicine

## 2016-06-22 ENCOUNTER — Encounter: Payer: Self-pay | Admitting: Family Medicine

## 2016-06-22 ENCOUNTER — Ambulatory Visit (INDEPENDENT_AMBULATORY_CARE_PROVIDER_SITE_OTHER)
Admission: RE | Admit: 2016-06-22 | Discharge: 2016-06-22 | Disposition: A | Discharge status: Home / Self Care | Payer: PPO | Source: Ambulatory Visit | Attending: Family Medicine | Admitting: Family Medicine

## 2016-06-22 ENCOUNTER — Ambulatory Visit (INDEPENDENT_AMBULATORY_CARE_PROVIDER_SITE_OTHER): Payer: PPO | Admitting: Family Medicine

## 2016-06-22 VITALS — BP 138/74 | HR 68 | Temp 97.7°F | Ht 59.0 in | Wt 122.7 lb

## 2016-06-22 DIAGNOSIS — M25551 Pain in right hip: Secondary | ICD-10-CM | POA: Diagnosis not present

## 2016-06-22 DIAGNOSIS — M545 Low back pain: Secondary | ICD-10-CM

## 2016-06-22 DIAGNOSIS — M47816 Spondylosis without myelopathy or radiculopathy, lumbar region: Secondary | ICD-10-CM | POA: Diagnosis not present

## 2016-06-22 DIAGNOSIS — M160 Bilateral primary osteoarthritis of hip: Secondary | ICD-10-CM | POA: Diagnosis not present

## 2016-06-22 NOTE — Progress Notes (Signed)
Referral to PT for lower back and right sided hip pain.

## 2016-06-22 NOTE — Patient Instructions (Addendum)
Please go to [place] at [facility] for x-rays of your hip and back. Results will be called to you within one week or sooner if needed.  Please take precautions to avoid a fall and avoid carrying heavy luggages from your trip.  Follow up will be determined based upon results of your x-rays.

## 2016-06-22 NOTE — Progress Notes (Signed)
Pre visit review using our clinic review tool, if applicable. No additional management support is needed unless otherwise documented below in the visit note. 

## 2016-06-22 NOTE — Progress Notes (Signed)
Subjective:    Patient ID: Julie [name], female    DOB: 1940-11-20, 75 y.o.   MRN: [id number]  HPI  Julie [name] is a 75 year old female who presents today with lower right sided back pain that has been present for 13 days. Pain has been exacerbated with recent trip to [place] and [place] where she described walking "great distances" every day. She returned today by airplane and reports a mechanical fall that occurred at the airport as she attempted to catch a suitcase that was falling from a rack. She denies hitting her head, LOC, or neuro/cardio prodrome. She was evaluated by EMS prior to traveling and states that she did not sustain any injury. She has a prior history of spinal surgery.   Another possible trigger is noted with a report of falling out of her bed approximately 2 months ago after sleeping her in bed. She reports that she rolled out of bed and was seen in urgent care with subsequent imaging. Patient reports that she did not sustain a fracture and area has remained sensitive since that time. She states that she did not hit her head with this fall and reports nightly use of Ambien for sleep.    BACK PAIN  Location: lower back pain Quality: 9 to 10 with walking Onset: 13 days ago prior to recent overseas trip Worse with: walking    Better with: Sitting and Standing     Radiation: minimal radiation with walking Trauma: Mechanical fall in airport today as stated previously Best sitting/standing/leaning forward: Better standing  Red Flags Fecal/urinary incontinence: No Numbness/Weakness: No Fever/chills/sweats: No Night pain: No Unexplained weight loss: No No relief with bedrest:  Relief with antiinflammatory and rest h/o cancer/immunosuppression:  No IV drug use:  No PMH of osteoporosis or chronic steroid use: Osteoporosis. Currently on Fosamax weekly  Right Hip pain is also present with lower back pain that started 13 days . She states that this pain can be as high as a  9 to 10 with walking and reports that she feels that her "leg may give away with her" at times.  History of osteoporosis and treatment with fosamax. Treatment with naprosyn that was provided to her in [place] has provided benefit which allowed her to travel and walk daily over that past 10 days.   Review of Systems  Constitutional: Negative for chills and fever.  Eyes: Negative for visual disturbance.  Respiratory: Negative for cough and shortness of breath.   Cardiovascular: Negative for chest pain and palpitations.  Gastrointestinal: Negative for abdominal pain, diarrhea, nausea and vomiting.  Musculoskeletal: Positive for back pain.       Right hip and right sided lower back pain  Neurological: Negative for dizziness, seizures, syncope, weakness, light-headedness, numbness and headaches.  Hematological: Does not bruise/bleed easily.  Psychiatric/Behavioral:       Denies depressed or anxious mood today      Past Medical History:  Diagnosis Date  . Arthritis    lower back shoulder  . Arthropathy, unspecified, site unspecified   . Cancer ([place])    skin -spots on face removed  . Contact dermatitis and other eczema, due to unspecified cause    History  . GERD (gastroesophageal reflux disease)    history - no current prob - no med  . 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 Other bursitis disorders    history  . Personal history of  colonic polyps   . PONV (postoperative nausea and vomiting)   . Pure hypercholesterolemia   . SVD (spontaneous vaginal delivery)    x 1  . Unspecified disorder of thyroid   . Unspecified hypothyroidism      Social History   Social History  . Marital status: Married    Spouse name: N/A  . Number of children: N/A  . Years of education: N/A   Occupational History  . Not on file.   Social History Main Topics  . Smoking status: Never Smoker  . Smokeless tobacco: Never Used  . Alcohol use Yes      Comment: rare  . Drug use: No  . Sexual activity: Yes    Birth control/ protection: Post-menopausal   Other Topics Concern  . Not on file   Social History Narrative  . No narrative on file    Past Surgical History:  Procedure Laterality Date  . BACK SURGERY  2002   L4/L5  . BLADDER SUSPENSION N/A 03/07/2014   Procedure: TRANSVAGINAL TAPE (TVT) PROCEDURE WITH CYSTO;  Surgeon: Princess Bruins, MD;  Location: [facility];  Service: Gynecology;  Laterality: N/A;  vaginal   . blephorplasty      bilateral   . BREAST SURGERY     x 2 breast implants, later removed  . COLONOSCOPY    . CYSTOCELE REPAIR N/A 03/07/2014   Procedure: ANTERIOR REPAIR (CYSTOCELE);  Surgeon: Princess Bruins, MD;  Location: [facility];  Service: Gynecology;  Laterality: N/A;  . devaited septum surgery    . EYE SURGERY     bilateral cataract surgery  . KNEE ARTHROSCOPY  10/21/2012   right - Procedure: ARTHROSCOPY KNEE;  Surgeon: Mcarthur Rossetti, MD;  Location: [facility];  Service: Orthopedics;  Laterality: Left;  Left Knee Arthroscopy, Debridement, partial synovectomy  . LUMBAR LAMINECTOMY    . ROBOTIC ASSISTED LAPAROSCOPIC SACROCOLPOPEXY N/A 03/07/2014   Procedure: ROBOTIC ASSISTED LAPAROSCOPIC SACROCOLPOPEXY With Carrolyn Meiers;  Surgeon: Princess Bruins, MD;  Location: [facility];  Service: Gynecology;  Laterality: N/A;  . ROBOTIC ASSISTED SUPRACERVICAL HYSTERECTOMY WITH BILATERAL SALPINGO OOPHERECTOMY N/A 03/07/2014   Procedure: ROBOTIC ASSISTED SUPRACERVICAL HYSTERECTOMY WITH BILATERAL SALPINGO OOPHORECTOMY;  Surgeon: Princess Bruins, MD;  Location: [facility];  Service: Gynecology;  Laterality: N/A;  4 hrs.  Marland Kitchen TOTAL KNEE ARTHROPLASTY Left 12/30/2012   Procedure: TOTAL KNEE ARTHROPLASTY;  Surgeon: Mcarthur Rossetti, MD;  Location: [facility];  Service: Orthopedics;  Laterality: Left;  Left Total Knee Arthroplasty  . TUBAL LIGATION    . WISDOM TOOTH EXTRACTION      Family History  Problem Relation Age of Onset  . Cancer  Mother     breast and lung  . Stroke Father   . Heart attack Father   . Diabetes Sister   . Emphysema Maternal Aunt   . Colon cancer Neg Hx     Allergies  Allergen Reactions  . Penicillins Anaphylaxis    REACTION: causes tongue to swell Has patient had a PCN reaction causing immediate rash, facial/tongue/throat swelling, SOB or lightheadedness with hypotension: Yes swelling Has patient had a PCN reaction causing severe rash involving mucus membranes or skin necrosis:  Has patient had a PCN reaction that required hospitalization no Has patient had a PCN reaction occurring within the last 10 years: no If all of the above answers are "NO", then may proceed with Cephalosporin use.   . Codeine Nausea Only  . Lisinopril     REACTION: wheezing  . Percocet [Oxycodone-Acetaminophen] Nausea And  Vomiting    Current Outpatient Prescriptions on File Prior to Visit  Medication Sig Dispense Refill  . alendronate (FOSAMAX) 70 MG tablet Take 1 tablet (70 mg total) by mouth every 7 (seven) days. Take with a full glass of water on an empty stomach. 4 tablet 11  . Cholecalciferol 5000 UNITS capsule Take 5,000 Units by mouth daily.    . Cyanocobalamin (VITAMIN B-12 PO) Take 5,000 mcg by mouth daily.    Marland Kitchen FLUoxetine (PROZAC) 20 MG tablet Take 1 tablet (20 mg total) by mouth daily. 30 tablet 6  . KRILL OIL PO Take 1 tablet by mouth daily.     Marland Kitchen omeprazole (PRILOSEC) 40 MG capsule Take 1 capsule (40 mg total) by mouth daily. 90 capsule 1  . thyroid (ARMOUR THYROID) 30 MG tablet Take 1 tablet (30 mg total) by mouth daily. 90 tablet 1  . VITAMIN E PO Take 1 tablet by mouth daily.    Marland Kitchen zolpidem (AMBIEN) 10 MG tablet take 1 tablet by mouth at bedtime if needed 30 tablet 2   No current facility-administered medications on file prior to visit.     BP 138/74 (BP Location: Left Arm, Patient Position: Sitting, Cuff Size: Normal)   Pulse 68   Temp 97.7 F (36.5 C) (Oral)   Ht 4\' 11"  (1.499 m)   Wt 122 lb  11.2 oz (55.7 kg)   SpO2 98%   BMI 24.78 kg/m    Objective:   Physical Exam  Constitutional: She is oriented to person, place, and time. She appears well-developed and well-nourished.  Antalgic gait  Eyes: Pupils are equal, round, and reactive to light. No scleral icterus.  Neck: Neck supple.  Cardiovascular: Normal rate, regular rhythm and intact distal pulses.   Pulmonary/Chest: Effort normal and breath sounds normal. She has no wheezes. She has no rales.  Abdominal: Soft. Bowel sounds are normal. There is no tenderness.  Musculoskeletal: She exhibits no edema.       Right hip: She exhibits tenderness. She exhibits normal strength, no swelling, no crepitus and no deformity.  Spine with normal alignment and no deformity. No tenderness to vertebral process with palpation with the exception of mild tenderness noted on right side of lower back around L4 to L5.  Paraspinous muscles are tender and patient notes this area as tender with sharp pain when walking. ROM is full at lumbar sacral regions  Negative Straight Leg raise. No CVA tenderness present. Able to raise up on heel and toes without pain.  Right hip tender to palpation. Tenderness is noted in a diffuse pattern with discomfort mainly noted in the area of the greater trochanter.   No ecchymosis present on right hip and lower back  Lymphadenopathy:    She has no cervical adenopathy.  Neurological: She is alert and oriented to person, place, and time. No sensory deficit.  Reflex Scores:      Patellar reflexes are 2+ on the right side and 2+ on the left side. Minimal difference noted in strength in the right lower extremity.   Skin: Skin is warm and dry. No rash noted.  Psychiatric: She has a normal mood and affect. Her behavior is normal. Judgment and thought content normal.       Assessment & Plan:  1. Right hip pain Recent fall in airport  with history of osteoporosis and pain with ambulation provide support for imaging of  the right hip to rule out fracture. Low suspicion of fracture with exam findings, however  patient's antalgic gait, recent fall today with fall described 2 months ago supports imaging. Advised patient that she can continue antiinflammatory medications and can switch to ibuprofen 600 mg every 6 hours as she states that she does not have any more naprosyn. Advised her that this is short term and results of imaging will determine treatment plan. Further advised her to discontinue use of ambien this evening as her report of recent fall from rolling out of bed. Discussed possible PT if imaging does not indicate an acute issue and also advised her to follow up for evaluation of sleep disturbance and further treatment if needed.  - DG HIP UNILAT WITH PELVIS 2-3 VIEWS RIGHT; Future  2. Right low back pain, with sciatica presence unspecified History of osteoporosis and recent fall support imaging of back to rule out possible new compression fracture or other acute finding. Ibuprofen as stated above.  - DG Lumbar Spine Complete; Future  Highly recommended that she take extra precautions to avoid a fall and advised her to establish care with an PCP asap for routine care and management.  Delano Metz, FNP-C

## 2016-06-23 ENCOUNTER — Ambulatory Visit: Payer: PPO | Attending: Family Medicine

## 2016-06-23 DIAGNOSIS — Z01419 Encounter for gynecological examination (general) (routine) without abnormal findings: Secondary | ICD-10-CM | POA: Diagnosis not present

## 2016-06-23 DIAGNOSIS — M6281 Muscle weakness (generalized): Secondary | ICD-10-CM | POA: Diagnosis not present

## 2016-06-23 DIAGNOSIS — M5441 Lumbago with sciatica, right side: Secondary | ICD-10-CM

## 2016-06-23 DIAGNOSIS — Z124 Encounter for screening for malignant neoplasm of cervix: Secondary | ICD-10-CM | POA: Diagnosis not present

## 2016-06-23 NOTE — Therapy (Signed)
[facility] [address] [place], [place], [address] Phone: [phone]   Fax:  [phone]  Physical Therapy Evaluation  Patient Details  Name: Julie [name] MRN: [id number] Date of Birth: 11-Jan-1941 Referring Provider: Delano Metz, MD  Encounter Date: 06/23/2016      PT End of Session - 06/23/16 1224    Visit Number 1   Number of Visits 10   Date for PT Re-Evaluation 08/18/16   PT Start Time 1150   PT Stop Time 1247   PT Time Calculation (min) 57 min   Activity Tolerance Patient tolerated treatment well   Behavior During Therapy [facility] for tasks assessed/performed      Past Medical History:  Diagnosis Date  . Arthritis    lower [name] shoulder  . Arthropathy, unspecified, site unspecified   . Cancer ([place])    skin -spots on face removed  . Contact dermatitis and other eczema, due to unspecified cause    History  . GERD (gastroesophageal reflux disease)    history - no current prob - no med  . 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 Other bursitis disorders    history  . Personal history of colonic polyps   . PONV (postoperative nausea and vomiting)   . Pure hypercholesterolemia   . SVD (spontaneous vaginal delivery)    x 1  . Unspecified disorder of thyroid   . Unspecified hypothyroidism     Past Surgical History:  Procedure Laterality Date  . [name] SURGERY  2002   L4/L5  . BLADDER SUSPENSION N/A 03/07/2014   Procedure: TRANSVAGINAL TAPE (TVT) PROCEDURE WITH CYSTO;  Surgeon: Princess Bruins, MD;  Location: [facility];  Service: Gynecology;  Laterality: N/A;  vaginal   . blephorplasty      bilateral   . BREAST SURGERY     x 2 breast implants, later removed  . COLONOSCOPY    . CYSTOCELE REPAIR N/A 03/07/2014   Procedure: ANTERIOR REPAIR (CYSTOCELE);  Surgeon: Princess Bruins, MD;  Location: [facility];  Service: Gynecology;  Laterality: N/A;  .  devaited septum surgery    . EYE SURGERY     bilateral cataract surgery  . KNEE ARTHROSCOPY  10/21/2012   right - Procedure: ARTHROSCOPY KNEE;  Surgeon: Mcarthur Rossetti, MD;  Location: [facility];  Service: Orthopedics;  Laterality: Left;  Left Knee Arthroscopy, Debridement, partial synovectomy  . LUMBAR LAMINECTOMY    . ROBOTIC ASSISTED LAPAROSCOPIC SACROCOLPOPEXY N/A 03/07/2014   Procedure: ROBOTIC ASSISTED LAPAROSCOPIC SACROCOLPOPEXY With Carrolyn Meiers;  Surgeon: Princess Bruins, MD;  Location: [facility];  Service: Gynecology;  Laterality: N/A;  . ROBOTIC ASSISTED SUPRACERVICAL HYSTERECTOMY WITH BILATERAL SALPINGO OOPHERECTOMY N/A 03/07/2014   Procedure: ROBOTIC ASSISTED SUPRACERVICAL HYSTERECTOMY WITH BILATERAL SALPINGO OOPHORECTOMY;  Surgeon: Princess Bruins, MD;  Location: [facility];  Service: Gynecology;  Laterality: N/A;  4 hrs.  Marland Kitchen TOTAL KNEE ARTHROPLASTY Left 12/30/2012   Procedure: TOTAL KNEE ARTHROPLASTY;  Surgeon: Mcarthur Rossetti, MD;  Location[facility];  Service: Orthopedics;  Laterality: Left;  Left Total Knee Arthroplasty  . TUBAL LIGATION    . WISDOM TOOTH EXTRACTION      There were no vitals filed for this visit.       Subjective Assessment - 06/23/16 1154    Subjective Pt presents to PT with complaints of significant LBP (Rt>Lt) and Rt LE pain that began 13 days ago while traveling internationally.  Pt reports  that she has had 2 falls on Sunday (once in hotel without cause and once in the airport when trying to catch her luggage).     Pertinent History skin cancer   Limitations Sitting;Standing;Walking   How long can you sit comfortably? 30 minutes   How long can you stand comfortably? feels unsteady   Diagnostic tests x-ray  06/22/16: lumbar DDD and severe lumbar scoliosis concave Lt   Patient Stated Goals reduce LBP, reduce Rt LE pain, stand for work   Currently in Pain? Yes   Pain Score 10-Worst pain ever   Pain Location [name]   Pain Orientation Right;Lower   Pain  Descriptors / Indicators Aching;Shooting   Pain Type Acute pain   Pain Radiating Towards Rt LE pain   Pain Onset 1 to 4 weeks ago   Pain Frequency Constant   Aggravating Factors  sitting, negotiating steps, laying down   Pain Relieving Factors Aleve, heat, nothing helps right now            [facility] PT Assessment - 06/23/16 0001      Assessment   Medical Diagnosis Rt hip pain, Rt low [name] pain with sciatica   Referring Provider Delano Metz, MD   Onset Date/Surgical Date 06/10/16   Next MD Visit 07/2016   Prior Therapy none     Precautions   Precautions Fall;Other (comment)  skin cancer history     Restrictions   Weight Bearing Restrictions No     Balance Screen   Has the patient fallen in the past 6 months Yes   How many times? 2  while on vacation related to pain   Has the patient had a decrease in activity level because of a fear of falling?  Yes   Is the patient reluctant to leave their home because of a fear of falling?  No     Home Environment   Living Environment Private residence   Type of [place] Access Level entry   Home Layout One level     Prior Function   Level of Independence Independent   Vocation Part time employment   Solicitor, standing most of the day   Leisure walk dog     Cognition   Overall Cognitive Status Within Functional Limits for tasks assessed     Observation/Other Assessments   Focus on Therapeutic Outcomes (FOTO)  65% limitation     Posture/Postural Control   Posture/Postural Control Postural limitations   Postural Limitations Flexed trunk     ROM / Strength   AROM / PROM / Strength PROM;AROM;Strength     AROM   Overall AROM  Deficits   Overall AROM Comments Rt hip AROM limited by 50% due to pain with active motion.  Lumbar AROM is full with Rt lumbar pain with Rt sidebending and extension     PROM   Overall PROM  Deficits   Overall PROM Comments Rt hip PROM limited by 25% with pain,  Lt hip PROM is full     Strength   Overall Strength Deficits   Overall Strength Comments Rt LE 4/5 hip except 4-/5 hip flexion, Rt knee 4+/5 extension, knee flexion is 4+/5.  Lt LE is 4+/5 to 5/5     Palpation   Spinal mobility reduced by 25% in the thoracic and lumbar spine wihtout pain   Palpation comment trigger point in Rt gluteals/piriformis     Special Tests    Special Tests Lumbar  Lumbar Tests Slump Test;Straight Leg Raise     Slump test   Findings Negative   Side Right     Straight Leg Raise   Findings Negative   Side  Right     Ambulation/Gait   Ambulation/Gait Yes   Ambulation Distance (Feet) 100 Feet   Gait Pattern Step-through pattern;Decreased step length - right;Decreased stance time - right;Antalgic   Ambulation Surface Level                   [facility] Adult PT Treatment/Exercise - 06/23/16 0001      Modalities   Modalities Electrical Stimulation;Moist Heat     Moist Heat Therapy   Number Minutes Moist Heat 15 Minutes   Moist Heat Location Lumbar Spine     Electrical Stimulation   Electrical Stimulation Location Lumbar and Rt gluteals   Electrical Stimulation Action IFC   Electrical Stimulation Parameters 15 minutes   Electrical Stimulation Goals Pain                PT Education - 06/23/16 1222    Education provided Yes   Education Details low trunk rotation, seated figure 4, hamstring stretch   Person(s) Educated Patient   Methods Explanation;Demonstration;Handout   Comprehension Verbalized understanding;Returned demonstration          PT Short Term Goals - 06/23/16 1243      PT SHORT TERM GOAL #1   Title be independent in initial HEP   Time 4   Period Weeks   Status New     PT SHORT TERM GOAL #2   Title report a 30% reduction in LBP and Rt LE pain with sitting and standing    Time 4   Period Weeks   Status New     PT SHORT TERM GOAL #3   Title demonstrate symmetry with ambulation on level surface   Time 4    Period Weeks   Status New     PT SHORT TERM GOAL #4   Title demonstrate and verbalize correct body mechanics for ADLs and work tasks   Time 4   Period Weeks   Status New           PT Long Term Goals - 06/23/16 1146      PT LONG TERM GOAL #1   Title be independent in advanced HEP   Time 8   Period Weeks   Status New     PT LONG TERM GOAL #2   Title reduce FOTO to < or = to 44% limitation   Time 8   Period Weeks   Status New     PT LONG TERM GOAL #3   Title report a 60% reduction in LBP and Rt LE pain with home and work tasks   Time 8   Period Weeks   Status New     PT LONG TERM GOAL #4   Title describe Rt LE pain as intermittent   Time 8   Period Weeks   Status New     PT LONG TERM GOAL #5   Title tolerate sitting for 45 minutes without limitation   Time 8   Period Weeks   Status New               Plan - 06/23/16 1232    Clinical Impression Statement Pt presents to PT with significant Rt sided LBP and Rt LE pain that began ~2 weeks ago while on vacation.  Pt had 2 falls due  to pain and Rt LE instability while on her trip.  Pt had recent x-rays indicating lumbar DDD and scoliosis.  Pt demonstrates antalgic gait, pain and weakness upon testing of Rt LE, tenderness and trigger point in Rt gluteals and limited ability to sit > 30 minutes.  Pt will benefit from skilled PT for modalities, manual, flexibility, and strength to reduce pain, normalize gait and allow for work with safety and without limitation.    Rehab Potential Good   PT Frequency 2x / week   PT Duration 8 weeks   PT Treatment/Interventions ADLs/Self Care Home Management;Cryotherapy;Electrical Stimulation;Functional mobility training;Stair training;Ultrasound;Moist Heat;Therapeutic activities;Therapeutic exercise;Neuromuscular re-education;Patient/family education;Passive range of motion;Manual techniques;Dry needling;Taping   PT Next Visit Plan assess response to e-stim, discuss dry needling to Rt  gluteals, flexibility, manual, gentle mobility   Consulted and Agree with Plan of Care Patient      Patient will benefit from skilled therapeutic intervention in order to improve the following deficits and impairments:  Postural dysfunction, Decreased strength, Impaired flexibility, Decreased activity tolerance, Decreased range of motion, Decreased endurance, Decreased safety awareness, Improper body mechanics, Pain  Visit Diagnosis: Right-sided low [name] pain with right-sided sciatica - Plan: PT plan of care cert/re-cert  Muscle weakness (generalized) - Plan: PT plan of care cert/re-cert      G-Codes - [id number] 1147    Functional Assessment Tool Used FOTO: 65% limitation   Functional Limitation Mobility: Walking and moving around   Mobility: Walking and Moving Around Current Status [phone]) At least 60 percent but less than 80 percent impaired, limited or restricted   Mobility: Walking and Moving Around Goal Status [phone]) At least 40 percent but less than 60 percent impaired, limited or restricted       Problem List Patient Active Problem List   Diagnosis Date Noted  . Adjustment disorder with depressed mood 03/16/2016  . Pain in joint, ankle and foot 02/11/2015  . Postoperative state 03/07/2014  . Preventative health care 10/30/2013  . Prediabetes 03/19/2013  . Osteoporosis 01/30/2013  . Postoperative anemia due to acute blood loss 01/30/2013  . Diarrhea 01/30/2013  . Pigmented villonodular synovitis of left knee 12/30/2012  . Acute meniscal tear of knee 10/21/2012  . Chronic insomnia 09/05/2012  . Gout 09/05/2012  . Right shoulder pain 10/23/2011  . Hip pain, left 10/23/2011  . 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 CONSTIPATION 05/14/2009  . COLONIC POLYPS, HX OF 05/13/2009  . FATIGUE 02/11/2009  . DERMATITIS 03/05/2008  . Essential hypertension 10/10/2007  . FOOT PAIN, RIGHT 10/10/2007  . UNSPECIFIED DISORDER OF THYROID 10/03/2007  . OTHER ABNORMAL GLUCOSE  10/03/2007  . Hypothyroidism 09/17/2007  . HYPERCHOLESTEROLEMIA 09/17/2007  . Primary osteoarthritis of both hands 09/17/2007  . BURSITIS 09/17/2007  . BREAST IMPLANTS, BILATERAL, HX OF 09/17/2007  . LAMINECTOMY, LUMBAR, HX OF 09/17/2007     Sigurd Sos, PT 06/23/16 12:50 PM  [facility] [address] [place], [place], [address] Phone: [phone]   Fax:  [phone]  Name: HERMONIE RASMUSSON MRN: [id number] Date of Birth: 07-04-1941

## 2016-06-23 NOTE — Patient Instructions (Addendum)
Perform all exercises below:  Hold _20___ seconds. Repeat _3___ times.  Do __3__ sessions per day. CAUTION: Movement should be gentle, steady and slow.  Lumbar Rotation: Caudal - Bilateral (Supine)  Feet and knees together, arms outstretched, rotate knees left, turning head in opposite direction, until stretch is felt.    Piriformis Stretch, Sitting    Sit, one ankle on opposite knee, same-side hand on crossed knee. Push down on knee, keeping spine straight. Lean torso forward, with flat back, until tension is felt in hamstrings and gluteals of crossed-leg side. Hold __20_ seconds.  Repeat _3__ times per session. Do _3__ sessions per day.  Copyright  VHI. All rights reserved.    HIP: Hamstrings - Short Sitting   Rest leg on raised surface. Keep knee straight. Lift chest.   [facility] [address], [place], [place] [address] Phone # [phone] Fax [phone]

## 2016-06-26 ENCOUNTER — Other Ambulatory Visit: Payer: Self-pay | Admitting: Family Medicine

## 2016-06-26 ENCOUNTER — Telehealth: Payer: Self-pay | Admitting: Internal Medicine

## 2016-06-26 MED ORDER — DICLOFENAC SODIUM 75 MG PO TBEC: 75.0000 mg | DELAYED_RELEASE_TABLET | Freq: Two times a day (BID) | ORAL | 0 refills | Status: DC

## 2016-06-26 NOTE — Progress Notes (Signed)
Short term diclofenac has been sent to pharmacy for patient. Advise patient to take this with food and follow up in 1 to 2 weeks for evaluation. If symptoms do not improve with treatment and PT, a referral to an orthopedic specialist can be initiated.

## 2016-06-26 NOTE — Telephone Encounter (Signed)
Pt state that she is still having the same problems and it is very hard to walk.  Taking Tylenol every 6 hours and not helping.

## 2016-06-26 NOTE — Telephone Encounter (Signed)
Diclofenac sodium (Voltaren) which is an antiinflammatory will be sent to patient's pharmacy. If symptoms do not improve with medication and PT, a referral to an orthopedic specialist can be completed. Recommend that she follow up in one to two weeks for reevaluation or sooner if needed.

## 2016-06-26 NOTE — Telephone Encounter (Signed)
Gregary Signs reccommended pt establish care with PCP for routine care and management.   Will forward message to [place].

## 2016-06-29 ENCOUNTER — Ambulatory Visit: Payer: PPO | Admitting: Physical Therapy

## 2016-06-29 ENCOUNTER — Encounter: Payer: Self-pay | Admitting: Physical Therapy

## 2016-06-29 DIAGNOSIS — M6281 Muscle weakness (generalized): Secondary | ICD-10-CM

## 2016-06-29 DIAGNOSIS — M5441 Lumbago with sciatica, right side: Secondary | ICD-10-CM

## 2016-06-29 DIAGNOSIS — M545 Low back pain: Secondary | ICD-10-CM | POA: Diagnosis not present

## 2016-06-29 NOTE — Therapy (Addendum)
[facility] [address], [place], [place], [address] Phone: [phone]   Fax:  [phone]  Physical Therapy Treatment  Patient Details  Name: Julie [name] MRN: [id number] Date of Birth: 05/06/1941 Referring Provider: Delano Metz, MD  Encounter Date: 06/29/2016      PT End of Session - 06/29/16 1612    Visit Number 2   Number of Visits 10   Date for PT Re-Evaluation 08/18/16   PT Start Time [id number]   PT Stop Time 1620   PT Time Calculation (min) 50 min   Activity Tolerance Patient tolerated treatment well   Behavior During Therapy [facility] for tasks assessed/performed      Past Medical History:  Diagnosis Date  . Arthritis    lower back shoulder  . Arthropathy, unspecified, site unspecified   . Cancer ([place])    skin -spots on face removed  . Contact dermatitis and other eczema, due to unspecified cause    History  . GERD (gastroesophageal reflux disease)    history - no current prob - no med  . 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 Other bursitis disorders    history  . Personal history of colonic polyps   . PONV (postoperative nausea and vomiting)   . Pure hypercholesterolemia   . SVD (spontaneous vaginal delivery)    x 1  . Unspecified disorder of thyroid   . Unspecified hypothyroidism     Past Surgical History:  Procedure Laterality Date  . BACK SURGERY  2002   L4/L5  . BLADDER SUSPENSION N/A 03/07/2014   Procedure: TRANSVAGINAL TAPE (TVT) PROCEDURE WITH CYSTO;  Surgeon: Princess Bruins, MD;  Location: [facility];  Service: Gynecology;  Laterality: N/A;  vaginal   . blephorplasty      bilateral   . BREAST SURGERY     x 2 breast implants, later removed  . COLONOSCOPY    . CYSTOCELE REPAIR N/A 03/07/2014   Procedure: ANTERIOR REPAIR (CYSTOCELE);  Surgeon: Princess Bruins, MD;  Location: [facility];  Service: Gynecology;  Laterality: N/A;  .  devaited septum surgery    . EYE SURGERY     bilateral cataract surgery  . KNEE ARTHROSCOPY  10/21/2012   right - Procedure: ARTHROSCOPY KNEE;  Surgeon: Mcarthur Rossetti, MD;  Location: [facility];  Service: Orthopedics;  Laterality: Left;  Left Knee Arthroscopy, Debridement, partial synovectomy  . LUMBAR LAMINECTOMY    . ROBOTIC ASSISTED LAPAROSCOPIC SACROCOLPOPEXY N/A 03/07/2014   Procedure: ROBOTIC ASSISTED LAPAROSCOPIC SACROCOLPOPEXY With Carrolyn Meiers;  Surgeon: Princess Bruins, MD;  Location: [facility];  Service: Gynecology;  Laterality: N/A;  . ROBOTIC ASSISTED SUPRACERVICAL HYSTERECTOMY WITH BILATERAL SALPINGO OOPHERECTOMY N/A 03/07/2014   Procedure: ROBOTIC ASSISTED SUPRACERVICAL HYSTERECTOMY WITH BILATERAL SALPINGO OOPHORECTOMY;  Surgeon: Princess Bruins, MD;  Location: [facility];  Service: Gynecology;  Laterality: N/A;  4 hrs.  Marland Kitchen TOTAL KNEE ARTHROPLASTY Left 12/30/2012   Procedure: TOTAL KNEE ARTHROPLASTY;  Surgeon: Mcarthur Rossetti, MD;  Location: [facility];  Service: Orthopedics;  Laterality: Left;  Left Total Knee Arthroplasty  . TUBAL LIGATION    . WISDOM TOOTH EXTRACTION      There were no vitals filed for this visit.      Subjective Assessment - 06/29/16 1535    Subjective Pt presents with antaligc gait and straight cane due to pain. Reports saw knee doctor today who has ordered an MRI on knee. Pt reports she  was given multiple pain meds but nothing has helped.    Pertinent History skin cancer   Limitations Sitting;Standing;Walking   How long can you sit comfortably? 30 minutes   How long can you stand comfortably? feels unsteady   Diagnostic tests x-ray  06/22/16: lumbar DDD and severe lumbar scoliosis concave Lt   Patient Stated Goals reduce LBP, reduce Rt LE pain, stand for work   Currently in Pain? Yes   Pain Score 10-Worst pain ever   Pain Location Back   Pain Orientation Right;Lower   Pain Descriptors / Indicators Aching;Shooting   Pain Type Acute pain   Pain Onset 1 to  4 weeks ago                         [facility] Adult PT Treatment/Exercise - 06/29/16 0001      Exercises   Exercises Lumbar;Knee/Hip     Lumbar Exercises: Stretches   Passive Hamstring Stretch 2 reps;10 seconds   Single Knee to Chest Stretch 2 reps;10 seconds   Lower Trunk Rotation 1 rep   Piriformis Stretch 2 reps;10 seconds     Lumbar Exercises: Seated   Long Arc Quad on Chair Strengthening;Both;1 set;10 reps     Lumbar Exercises: Supine   Clam 10 reps   Heel Slides 20 reps   Heel Slides Limitations pain in Rt back   Bent Knee Raise 20 reps     Modalities   Modalities Electrical Stimulation;Moist Heat     Moist Heat Therapy   Moist Heat Location Lumbar Spine     Electrical Stimulation   Electrical Stimulation Location Lumbar and Rt gluteals     Manual Therapy   Manual Therapy Soft tissue mobilization   Manual therapy comments to Rt low back      Electric Stimulation to lumbar spine and right gluteals for 15 minutes IFC to patient tolerance.  Mikle Bosworth PTA Addended 07/01/16            PT Short Term Goals - 06/29/16 1542      PT SHORT TERM GOAL #1   Title be independent in initial HEP   Time 4   Period Weeks   Status On-going     PT SHORT TERM GOAL #2   Title report a 30% reduction in LBP and Rt LE pain with sitting and standing    Time 4   Period Weeks   Status On-going     PT SHORT TERM GOAL #3   Title demonstrate symmetry with ambulation on level surface   Time 4   Period Weeks   Status On-going     PT SHORT TERM GOAL #4   Title demonstrate and verbalize correct body mechanics for ADLs and work tasks   Time 4   Period Weeks   Status On-going           PT Long Term Goals - 06/29/16 1542      PT LONG TERM GOAL #1   Title be independent in advanced HEP   Time 8   Period Weeks   Status On-going     PT LONG TERM GOAL #2   Title reduce FOTO to < or = to 44% limitation   Time 8   Period Weeks   Status  On-going     PT LONG TERM GOAL #3   Title report a 60% reduction in LBP and Rt LE pain with home and work tasks   Time 8  Period Weeks   Status On-going     PT LONG TERM GOAL #4   Title describe Rt LE pain as intermittent   Time 8   Period Weeks   Status On-going     PT LONG TERM GOAL #5   Title tolerate sitting for 45 minutes without limitation   Time 8   Period Weeks   Status On-going       Clinical Assessment: Pt presents with guarded posture. Reports 10/10 pain in Rt low extremity. Limited tolerance for exercises due to pain. Able to complete all exercises with rest breaks. Will continue to decrease pain and increase strength and stability as tolerated.  Plan: Assess pain at next visit, continue to strengthen as tolerated, continue to decrease pain. Manual soft tissue mobilization if able.  Mikle Bosworth PTA Addended 07/01/16      Patient will benefit from skilled therapeutic intervention in order to improve the following deficits and impairments:     Visit Diagnosis: Right-sided low back pain with right-sided sciatica  Muscle weakness (generalized)     Problem List Patient Active Problem List   Diagnosis Date Noted  . Adjustment disorder with depressed mood 03/16/2016  . Pain in joint, ankle and foot 02/11/2015  . Postoperative state 03/07/2014  . Preventative health care 10/30/2013  . Prediabetes 03/19/2013  . Osteoporosis 01/30/2013  . Postoperative anemia due to acute blood loss 01/30/2013  . Diarrhea 01/30/2013  . Pigmented villonodular synovitis of left knee 12/30/2012  . Acute meniscal tear of knee 10/21/2012  . Chronic insomnia 09/05/2012  . Gout 09/05/2012  . Right shoulder pain 10/23/2011  . Hip pain, left 10/23/2011  . 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 CONSTIPATION 05/14/2009  . COLONIC POLYPS, HX OF 05/13/2009  . FATIGUE 02/11/2009  . DERMATITIS 03/05/2008  . Essential hypertension 10/10/2007  . FOOT PAIN, RIGHT 10/10/2007  .  UNSPECIFIED DISORDER OF THYROID 10/03/2007  . OTHER ABNORMAL GLUCOSE 10/03/2007  . Hypothyroidism 09/17/2007  . HYPERCHOLESTEROLEMIA 09/17/2007  . Primary osteoarthritis of both hands 09/17/2007  . BURSITIS 09/17/2007  . BREAST IMPLANTS, BILATERAL, HX OF 09/17/2007  . LAMINECTOMY, LUMBAR, HX OF 09/17/2007    Mikle Bosworth PTA 06/29/2016, 4:14 PM  [facility] [address], [place], [place], [address] Phone: [phone]   Fax:  [phone]  Name: Julie [name] MRN: [id number] Date of Birth: June 16, 1941

## 2016-06-30 ENCOUNTER — Encounter: Payer: PPO | Admitting: Physical Therapy

## 2016-07-02 ENCOUNTER — Ambulatory Visit: Payer: PPO | Admitting: Physical Therapy

## 2016-07-02 ENCOUNTER — Encounter: Payer: Self-pay | Admitting: Physical Therapy

## 2016-07-02 DIAGNOSIS — M6281 Muscle weakness (generalized): Secondary | ICD-10-CM

## 2016-07-02 DIAGNOSIS — M5441 Lumbago with sciatica, right side: Secondary | ICD-10-CM | POA: Diagnosis not present

## 2016-07-02 NOTE — Patient Instructions (Signed)
Quad Set   With other leg bent, foot flat, slowly tighten muscles on thigh of straight leg while counting out loud to __5__. Repeat with other leg. Repeat __10__ times. Do __2sets   Abduction: Clam (Eccentric) - Side-Lying    Lie on side with knees bent. Lift top knee, keeping feet together. Keep trunk steady. Slowly lower for 3-5 seconds. _10__ reps per set, __2_ sets per day.   http://ecce.exer.us/65   Copyright  VHI. All rights reserved.  __ sets *use towel behind knee if needed*  http://gt2.exer.us/276   Copyright  VHI. All rights reserved.   Hip Flexion / Knee Extension: Straight-Leg Raise (eccentric)     Lie on back. Lift leg with knee straight. Slowly lower leg for 3-5 seconds. _10__ reps per set, __2_ sets per day. Lower like elevator, stopping at each floor. Add ___ lbs when you achieve ___ repetitions. Rest on elbows. Rest on straight arms.   Jeanie Sewer PTA [facility] [address], [place], [place] [address] Phone # [phone] Fax [phone]

## 2016-07-02 NOTE — Therapy (Signed)
[facility] [address], [place], [place], [address] Phone: [phone]   Fax:  [phone]  Physical Therapy Treatment  Patient Details  Name: Julie [name] MRN: [id number] Date of Birth: January 19, 1941 Referring Provider: Delano Metz, MD  Encounter Date: 07/02/2016      PT End of Session - 07/02/16 1620    Visit Number 3   Number of Visits 10   Date for PT Re-Evaluation 08/18/16   PT Start Time 1604   PT Stop Time 1644   PT Time Calculation (min) 40 min   Activity Tolerance Patient tolerated treatment well   Behavior During Therapy [facility] for tasks assessed/performed      Past Medical History:  Diagnosis Date  . Arthritis    lower back shoulder  . Arthropathy, unspecified, site unspecified   . Cancer ([place])    skin -spots on face removed  . Contact dermatitis and other eczema, due to unspecified cause    History  . GERD (gastroesophageal reflux disease)    history - no current prob - no med  . 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 Other bursitis disorders    history  . Personal history of colonic polyps   . PONV (postoperative nausea and vomiting)   . Pure hypercholesterolemia   . SVD (spontaneous vaginal delivery)    x 1  . Unspecified disorder of thyroid   . Unspecified hypothyroidism     Past Surgical History:  Procedure Laterality Date  . BACK SURGERY  2002   L4/L5  . BLADDER SUSPENSION N/A 03/07/2014   Procedure: TRANSVAGINAL TAPE (TVT) PROCEDURE WITH CYSTO;  Surgeon: Princess Bruins, MD;  Location: [facility];  Service: Gynecology;  Laterality: N/A;  vaginal   . blephorplasty      bilateral   . BREAST SURGERY     x 2 breast implants, later removed  . COLONOSCOPY    . CYSTOCELE REPAIR N/A 03/07/2014   Procedure: ANTERIOR REPAIR (CYSTOCELE);  Surgeon: Princess Bruins, MD;  Location: [facility];  Service: Gynecology;  Laterality: N/A;  .  devaited septum surgery    . EYE SURGERY     bilateral cataract surgery  . KNEE ARTHROSCOPY  10/21/2012   right - Procedure: ARTHROSCOPY KNEE;  Surgeon: Mcarthur Rossetti, MD;  Location: [facility];  Service: Orthopedics;  Laterality: Left;  Left Knee Arthroscopy, Debridement, partial synovectomy  . LUMBAR LAMINECTOMY    . ROBOTIC ASSISTED LAPAROSCOPIC SACROCOLPOPEXY N/A 03/07/2014   Procedure: ROBOTIC ASSISTED LAPAROSCOPIC SACROCOLPOPEXY With Carrolyn Meiers;  Surgeon: Princess Bruins, MD;  Location: [facility];  Service: Gynecology;  Laterality: N/A;  . ROBOTIC ASSISTED SUPRACERVICAL HYSTERECTOMY WITH BILATERAL SALPINGO OOPHERECTOMY N/A 03/07/2014   Procedure: ROBOTIC ASSISTED SUPRACERVICAL HYSTERECTOMY WITH BILATERAL SALPINGO OOPHORECTOMY;  Surgeon: Princess Bruins, MD;  Location: [facility];  Service: Gynecology;  Laterality: N/A;  4 hrs.  Marland Kitchen TOTAL KNEE ARTHROPLASTY Left 12/30/2012   Procedure: TOTAL KNEE ARTHROPLASTY;  Surgeon: Mcarthur Rossetti, MD;  Location: [facility];  Service: Orthopedics;  Laterality: Left;  Left Total Knee Arthroplasty  . TUBAL LIGATION    . WISDOM TOOTH EXTRACTION      There were no vitals filed for this visit.      Subjective Assessment - 07/02/16 1606    Subjective Pt reports having a fall yesterday while out in the yard bending towatds ground. Reports knee buckling. Reports back is not really hurting today. Pt is  more concerned with fear of falling.    Pertinent History skin cancer   Limitations Sitting;Standing;Walking   How long can you sit comfortably? 30 minutes   How long can you stand comfortably? feels unsteady   Diagnostic tests x-ray  06/22/16: lumbar DDD and severe lumbar scoliosis concave Lt   Patient Stated Goals reduce LBP, reduce Rt LE pain, stand for work   Currently in Pain? No/denies   Pain Location --   Pain Orientation --   Pain Descriptors / Indicators --   Pain Type --   Pain Radiating Towards --   Pain Onset --   Pain Frequency --    Aggravating Factors  --   Pain Relieving Factors --                         [facility] Adult PT Treatment/Exercise - 07/02/16 0001      Lumbar Exercises: Supine   Straight Leg Raise 10 reps;2 seconds   Other Supine Lumbar Exercises Ball squeeze with ab set     Lumbar Exercises: Sidelying   Clam 10 reps     Knee/Hip Exercises: Aerobic   Nustep L1 x 6 mins  while therapist assesing pain     Knee/Hip Exercises: Supine   Quad Sets AROM;Strengthening;Right;2 sets;10 reps   Terminal Knee Extension AROM;Strengthening;Right;2 sets;10 reps     Knee/Hip Exercises: Prone   Hamstring Curl 3 sets   Hip Extension AROM;Strengthening;Right;2 sets;10 reps                PT Education - 07/02/16 1644    Education Details LE strengthening   Person(s) Educated Patient   Methods Handout   Comprehension Verbalized understanding          PT Short Term Goals - 06/29/16 1542      PT SHORT TERM GOAL #1   Title be independent in initial HEP   Time 4   Period Weeks   Status On-going     PT SHORT TERM GOAL #2   Title report a 30% reduction in LBP and Rt LE pain with sitting and standing    Time 4   Period Weeks   Status On-going     PT SHORT TERM GOAL #3   Title demonstrate symmetry with ambulation on level surface   Time 4   Period Weeks   Status On-going     PT SHORT TERM GOAL #4   Title demonstrate and verbalize correct body mechanics for ADLs and work tasks   Time 4   Period Weeks   Status On-going           PT Long Term Goals - 06/29/16 1542      PT LONG TERM GOAL #1   Title be independent in advanced HEP   Time 8   Period Weeks   Status On-going     PT LONG TERM GOAL #2   Title reduce FOTO to < or = to 44% limitation   Time 8   Period Weeks   Status On-going     PT LONG TERM GOAL #3   Title report a 60% reduction in LBP and Rt LE pain with home and work tasks   Time 8   Period Weeks   Status On-going     PT LONG TERM GOAL #4   Title  describe Rt LE pain as intermittent   Time 8   Period Weeks   Status On-going  PT LONG TERM GOAL #5   Title tolerate sitting for 45 minutes without limitation   Time 8   Period Weeks   Status On-going               Plan - 07/02/16 1645    Clinical Impression Statement Pt reports falling again yesterday when bending down to pick something up. Reports less back pain today. Mostly just concerned about falling. Pt is very weak through Rt knee compensating with knee extension and quad sets. Able to tolerate all exercises well.  Will continue to strengthen and stabilize Rt knee.    Rehab Potential Good   PT Frequency 2x / week   PT Duration 8 weeks   PT Treatment/Interventions ADLs/Self Care Home Management;Cryotherapy;Electrical Stimulation;Functional mobility training;Stair training;Ultrasound;Moist Heat;Therapeutic activities;Therapeutic exercise;Neuromuscular re-education;Patient/family education;Passive range of motion;Manual techniques;Dry needling;Taping   PT Next Visit Plan Proper body mechanics, knee strengthening    Consulted and Agree with Plan of Care Patient      Patient will benefit from skilled therapeutic intervention in order to improve the following deficits and impairments:  Postural dysfunction, Decreased strength, Impaired flexibility, Decreased activity tolerance, Decreased range of motion, Decreased endurance, Decreased safety awareness, Improper body mechanics, Pain  Visit Diagnosis: Right-sided low back pain with right-sided sciatica  Muscle weakness (generalized)     Problem List Patient Active Problem List   Diagnosis Date Noted  . Adjustment disorder with depressed mood 03/16/2016  . Pain in joint, ankle and foot 02/11/2015  . Postoperative state 03/07/2014  . Preventative health care 10/30/2013  . Prediabetes 03/19/2013  . Osteoporosis 01/30/2013  . Postoperative anemia due to acute blood loss 01/30/2013  . Diarrhea 01/30/2013  . Pigmented  villonodular synovitis of left knee 12/30/2012  . Acute meniscal tear of knee 10/21/2012  . Chronic insomnia 09/05/2012  . Gout 09/05/2012  . Right shoulder pain 10/23/2011  . Hip pain, left 10/23/2011  . 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 CONSTIPATION 05/14/2009  . COLONIC POLYPS, HX OF 05/13/2009  . FATIGUE 02/11/2009  . DERMATITIS 03/05/2008  . Essential hypertension 10/10/2007  . FOOT PAIN, RIGHT 10/10/2007  . UNSPECIFIED DISORDER OF THYROID 10/03/2007  . OTHER ABNORMAL GLUCOSE 10/03/2007  . Hypothyroidism 09/17/2007  . HYPERCHOLESTEROLEMIA 09/17/2007  . Primary osteoarthritis of both hands 09/17/2007  . BURSITIS 09/17/2007  . BREAST IMPLANTS, BILATERAL, HX OF 09/17/2007  . LAMINECTOMY, LUMBAR, HX OF 09/17/2007    Mikle Bosworth PTA 07/02/2016, 4:57 PM  [facility] [address], [place], [place], [address] Phone: [phone]   Fax:  [phone]  Name: Julie [name] MRN: [id number] Date of Birth: 1941-06-18

## 2016-07-07 ENCOUNTER — Ambulatory Visit: Payer: PPO | Attending: Family Medicine | Admitting: Physical Therapy

## 2016-07-07 DIAGNOSIS — M5441 Lumbago with sciatica, right side: Secondary | ICD-10-CM | POA: Diagnosis not present

## 2016-07-07 DIAGNOSIS — M6281 Muscle weakness (generalized): Secondary | ICD-10-CM | POA: Insufficient documentation

## 2016-07-07 NOTE — Therapy (Signed)
[facility] [address], [place], [place], [address] Phone: [phone]   Fax:  [phone]  Physical Therapy Treatment  Patient Details  Name: Julie [name] MRN: [id number] Date of Birth: 11-01-1941 Referring Provider: Delano Metz, MD  Encounter Date: 07/07/2016      PT End of Session - 07/07/16 1555    Visit Number 4   Number of Visits 10   Date for PT Re-Evaluation 08/18/16   PT Start Time [id number]   PT Stop Time [id number]   PT Time Calculation (min) 44 min   Activity Tolerance Patient tolerated treatment well      Past Medical History:  Diagnosis Date  . Arthritis    lower back shoulder  . Arthropathy, unspecified, site unspecified   . Cancer ([place])    skin -spots on face removed  . Contact dermatitis and other eczema, due to unspecified cause    History  . GERD (gastroesophageal reflux disease)    history - no current prob - no med  . 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 Other bursitis disorders    history  . Personal history of colonic polyps   . PONV (postoperative nausea and vomiting)   . Pure hypercholesterolemia   . SVD (spontaneous vaginal delivery)    x 1  . Unspecified disorder of thyroid   . Unspecified hypothyroidism     Past Surgical History:  Procedure Laterality Date  . BACK SURGERY  2002   L4/L5  . BLADDER SUSPENSION N/A 03/07/2014   Procedure: TRANSVAGINAL TAPE (TVT) PROCEDURE WITH CYSTO;  Surgeon: Princess Bruins, MD;  Location: [facility];  Service: Gynecology;  Laterality: N/A;  vaginal   . blephorplasty      bilateral   . BREAST SURGERY     x 2 breast implants, later removed  . COLONOSCOPY    . CYSTOCELE REPAIR N/A 03/07/2014   Procedure: ANTERIOR REPAIR (CYSTOCELE);  Surgeon: Princess Bruins, MD;  Location: [facility];  Service: Gynecology;  Laterality: N/A;  . devaited septum surgery    . EYE SURGERY     bilateral cataract  surgery  . KNEE ARTHROSCOPY  10/21/2012   right - Procedure: ARTHROSCOPY KNEE;  Surgeon: Mcarthur Rossetti, MD;  Location: [facility];  Service: Orthopedics;  Laterality: Left;  Left Knee Arthroscopy, Debridement, partial synovectomy  . LUMBAR LAMINECTOMY    . ROBOTIC ASSISTED LAPAROSCOPIC SACROCOLPOPEXY N/A 03/07/2014   Procedure: ROBOTIC ASSISTED LAPAROSCOPIC SACROCOLPOPEXY With Carrolyn Meiers;  Surgeon: Princess Bruins, MD;  Location: [facility];  Service: Gynecology;  Laterality: N/A;  . ROBOTIC ASSISTED SUPRACERVICAL HYSTERECTOMY WITH BILATERAL SALPINGO OOPHERECTOMY N/A 03/07/2014   Procedure: ROBOTIC ASSISTED SUPRACERVICAL HYSTERECTOMY WITH BILATERAL SALPINGO OOPHORECTOMY;  Surgeon: Princess Bruins, MD;  Location: [facility];  Service: Gynecology;  Laterality: N/A;  4 hrs.  Marland Kitchen TOTAL KNEE ARTHROPLASTY Left 12/30/2012   Procedure: TOTAL KNEE ARTHROPLASTY;  Surgeon: Mcarthur Rossetti, MD;  Location: [facility];  Service: Orthopedics;  Laterality: Left;  Left Total Knee Arthroplasty  . TUBAL LIGATION    . WISDOM TOOTH EXTRACTION      There were no vitals filed for this visit.      Subjective Assessment - 07/07/16 1530    Subjective My right knee is buckling causing me to fall repeatedly.  No warning.  It gave way 4 x in 2 weeks.  [facility]  Currently in Pain? Yes   Pain Score 3    Pain Location Back   Pain Orientation Lower;Right   Aggravating Factors  steps                         [facility] Adult PT Treatment/Exercise - 07/07/16 0001      Lumbar Exercises: Supine   Ab Set 5 reps   Bent Knee Raise 5 reps   Bridge 15 reps   Bridge Limitations with red ball   Isometric Hip Flexion 5 reps     Knee/Hip Exercises: Stretches   Active Hamstring Stretch Right;3 reps;30 seconds   Active Hamstring Stretch Limitations supine with strap     Knee/Hip Exercises: Aerobic   Nustep L1 x 7 mins  while therapist assesing pain     Knee/Hip Exercises: Standing    Other Standing Knee Exercises retro stepping 15x     Knee/Hip Exercises: Seated   Long Arc Quad Strengthening;Right;Left;15 reps   Long Arc Quad Limitations red band   [facility] with quad set 15x   Other Seated Knee/Hip Exercises seated red band ankle DF with  hip flexion 10x each   Sit to [employer] 15 reps  with pillow and blue foam     Knee/Hip Exercises: Sidelying   Clams red band 15x right and left                PT Education - 07/07/16 1555    Education provided Yes   Education Details abdominal brace series; seated red band LE ex's (patient given red band for home)   Person(s) Educated Patient   Methods Explanation;Demonstration;Handout   Comprehension Verbalized understanding;Returned demonstration          PT Short Term Goals - 07/07/16 2152      PT SHORT TERM GOAL #1   Title be independent in initial HEP   Time 4   Period Weeks   Status On-going     PT SHORT TERM GOAL #2   Title report a 30% reduction in LBP and Rt LE pain with sitting and standing    Time 4   Period Weeks   Status On-going     PT SHORT TERM GOAL #3   Title demonstrate symmetry with ambulation on level surface   Time 4   Period Weeks   Status On-going     PT SHORT TERM GOAL #4   Title demonstrate and verbalize correct body mechanics for ADLs and work tasks   Time 4   Period Weeks   Status On-going           PT Long Term Goals - 07/07/16 2153      PT LONG TERM GOAL #1   Title be independent in advanced HEP   Time 8   Period Weeks   Status On-going     PT LONG TERM GOAL #2   Title reduce FOTO to < or = to 44% limitation   Time 8   Period Weeks   Status On-going     PT LONG TERM GOAL #3   Title report a 60% reduction in LBP and Rt LE pain with home and work tasks   Time 8   Period Weeks   Status On-going     PT LONG TERM GOAL #4   Title describe Rt LE pain as intermittent   Time 8   Period Weeks   Status On-going     PT LONG TERM GOAL #  5   Title tolerate  sitting for 45 minutes without limitation   Time 8   Period Weeks   Status On-going               Plan - 07/07/16 1604    Clinical Impression Statement The patient reports continued quad muscle weakness causing knee give-way.  She is able to participate in low level quad, gluteal and core muscle strengthening without complaint of pain or excessive muscle fatigue.   She is receptive with progression of HEP with seated strengthening using red band.  She reports she has a knee brace at home that keeps her knee from giving way.  Encourage her to wear this for safety until strength gains are made.     PT Next Visit Plan quad strengthening (review seated red band ex's as needed);  sit to stand from high surface;  Nu-step increase time;  Core strengthening;  possible leg press with close supervision on/off for safety secondary to knee give-way      Patient will benefit from skilled therapeutic intervention in order to improve the following deficits and impairments:     Visit Diagnosis: Right-sided low back pain with right-sided sciatica  Muscle weakness (generalized)     Problem List Patient Active Problem List   Diagnosis Date Noted  . Adjustment disorder with depressed mood 03/16/2016  . Pain in joint, ankle and foot 02/11/2015  . Postoperative state 03/07/2014  . Preventative health care 10/30/2013  . Prediabetes 03/19/2013  . Osteoporosis 01/30/2013  . Postoperative anemia due to acute blood loss 01/30/2013  . Diarrhea 01/30/2013  . Pigmented villonodular synovitis of left knee 12/30/2012  . Acute meniscal tear of knee 10/21/2012  . Chronic insomnia 09/05/2012  . Gout 09/05/2012  . Right shoulder pain 10/23/2011  . Hip pain, left 10/23/2011  . 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 CONSTIPATION 05/14/2009  . COLONIC POLYPS, HX OF 05/13/2009  . FATIGUE 02/11/2009  . DERMATITIS 03/05/2008  . Essential hypertension 10/10/2007  . FOOT PAIN, RIGHT 10/10/2007  .  UNSPECIFIED DISORDER OF THYROID 10/03/2007  . OTHER ABNORMAL GLUCOSE 10/03/2007  . Hypothyroidism 09/17/2007  . HYPERCHOLESTEROLEMIA 09/17/2007  . Primary osteoarthritis of both hands 09/17/2007  . BURSITIS 09/17/2007  . BREAST IMPLANTS, BILATERAL, HX OF 09/17/2007  . LAMINECTOMY, LUMBAR, HX OF 09/17/2007   Ruben Im, PT 07/07/16 9:55 PM Phone: [phone] Fax: [phone]  Alvera Singh 07/07/2016, 9:54 PM  [facility] [address], [place], [place], [address] Phone: [phone]   Fax:  [phone]  Name: Julie [name] MRN: [id number] Date of Birth: 1940-11-07

## 2016-07-07 NOTE — Patient Instructions (Addendum)
      Seated with red band in a Figure 8 around feet (Shoes on!):  1) Extend leg in a press motion    10x  2)  Toes up/Knee up 10x     Ruben Im PT [facility] [address] [place], [place] [address] Phone # [phone] Fax [phone]

## 2016-07-10 ENCOUNTER — Other Ambulatory Visit: Payer: Self-pay | Admitting: Orthopaedic Surgery

## 2016-07-10 DIAGNOSIS — M545 Low back pain: Secondary | ICD-10-CM

## 2016-07-12 ENCOUNTER — Other Ambulatory Visit: Payer: Self-pay | Admitting: Internal Medicine

## 2016-07-13 ENCOUNTER — Ambulatory Visit: Payer: PPO | Admitting: Physical Therapy

## 2016-07-13 ENCOUNTER — Encounter: Payer: Self-pay | Admitting: Physical Therapy

## 2016-07-13 DIAGNOSIS — M5441 Lumbago with sciatica, right side: Secondary | ICD-10-CM | POA: Diagnosis not present

## 2016-07-13 DIAGNOSIS — M6281 Muscle weakness (generalized): Secondary | ICD-10-CM

## 2016-07-13 NOTE — Therapy (Signed)
[facility] [address] [place], [place], [address] Phone: [phone]   Fax:  [phone]  Physical Therapy Treatment  Patient Details  Name: Julie [name] MRN: [id number] Date of Birth: January 05, 1941 Referring Provider: Delano Metz, MD  Encounter Date: 07/13/2016      PT End of Session - 07/13/16 1616    Visit Number 5   Number of Visits 10   Date for PT Re-Evaluation 08/18/16   PT Start Time 1525   PT Stop Time 1628   PT Time Calculation (min) 63 min   Activity Tolerance Patient tolerated treatment well   Behavior During Therapy [facility] for tasks assessed/performed      Past Medical History:  Diagnosis Date  . Arthritis    lower back shoulder  . Arthropathy, unspecified, site unspecified   . Cancer ([place])    skin -spots on face removed  . Contact dermatitis and other eczema, due to unspecified cause    History  . GERD (gastroesophageal reflux disease)    history - no current prob - no med  . 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 Other bursitis disorders    history  . Personal history of colonic polyps   . PONV (postoperative nausea and vomiting)   . Pure hypercholesterolemia   . SVD (spontaneous vaginal delivery)    x 1  . Unspecified disorder of thyroid   . Unspecified hypothyroidism     Past Surgical History:  Procedure Laterality Date  . BACK SURGERY  2002   L4/L5  . BLADDER SUSPENSION N/A 03/07/2014   Procedure: TRANSVAGINAL TAPE (TVT) PROCEDURE WITH CYSTO;  Surgeon: Princess Bruins, MD;  Location: [facility];  Service: Gynecology;  Laterality: N/A;  vaginal   . blephorplasty      bilateral   . BREAST SURGERY     x 2 breast implants, later removed  . COLONOSCOPY    . CYSTOCELE REPAIR N/A 03/07/2014   Procedure: ANTERIOR REPAIR (CYSTOCELE);  Surgeon: Princess Bruins, MD;  Location: [facility];  Service: Gynecology;  Laterality: N/A;  .  devaited septum surgery    . EYE SURGERY     bilateral cataract surgery  . KNEE ARTHROSCOPY  10/21/2012   right - Procedure: ARTHROSCOPY KNEE;  Surgeon: Mcarthur Rossetti, MD;  Location: [facility];  Service: Orthopedics;  Laterality: Left;  Left Knee Arthroscopy, Debridement, partial synovectomy  . LUMBAR LAMINECTOMY    . ROBOTIC ASSISTED LAPAROSCOPIC SACROCOLPOPEXY N/A 03/07/2014   Procedure: ROBOTIC ASSISTED LAPAROSCOPIC SACROCOLPOPEXY With Carrolyn Meiers;  Surgeon: Princess Bruins, MD;  Location: [facility];  Service: Gynecology;  Laterality: N/A;  . ROBOTIC ASSISTED SUPRACERVICAL HYSTERECTOMY WITH BILATERAL SALPINGO OOPHERECTOMY N/A 03/07/2014   Procedure: ROBOTIC ASSISTED SUPRACERVICAL HYSTERECTOMY WITH BILATERAL SALPINGO OOPHORECTOMY;  Surgeon: Princess Bruins, MD;  Location: [facility];  Service: Gynecology;  Laterality: N/A;  4 hrs.  Marland Kitchen TOTAL KNEE ARTHROPLASTY Left 12/30/2012   Procedure: TOTAL KNEE ARTHROPLASTY;  Surgeon: Mcarthur Rossetti, MD;  Location: [facility];  Service: Orthopedics;  Laterality: Left;  Left Total Knee Arthroplasty  . TUBAL LIGATION    . WISDOM TOOTH EXTRACTION      There were no vitals filed for this visit.      Subjective Assessment - 07/13/16 1528    Subjective Pt reports pain in back is the worst but says it is not as bad as when first started coming to therapy. Reports doesn't trus knee.  Has not had any recent falls but is being very cautious.    Pertinent History skin cancer   Limitations Sitting;Standing;Walking   How long can you sit comfortably? 30 minutes   How long can you stand comfortably? feels unsteady   Diagnostic tests x-ray  06/22/16: lumbar DDD and severe lumbar scoliosis concave Lt   Patient Stated Goals reduce LBP, reduce Rt LE pain, stand for work   Currently in Pain? Yes   Pain Score 5    Pain Location Back   Pain Orientation Lower;Right   Pain Descriptors / Indicators Aching;Shooting   Pain Type Acute pain   Pain Radiating Towards Rt knee    Pain Onset 1 to 4 weeks ago   Pain Frequency Constant   Aggravating Factors  Steps   Pain Relieving Factors Aleve, heat   Multiple Pain Sites No                         [facility] Adult PT Treatment/Exercise - 07/13/16 0001      Lumbar Exercises: Supine   Bent Knee Raise 5 reps   Bridge 15 reps   Other Supine Lumbar Exercises Ball squeeze with ab set     Knee/Hip Exercises: Aerobic   Nustep L1 x 7 mins  while therapist assesing pain     Knee/Hip Exercises: Standing   Hip Abduction AROM;Stengthening;Both;2 sets;10 reps   Hip Extension AROM;Stengthening;Both;2 sets;10 reps     Knee/Hip Exercises: Seated   Ball Squeeze with quad set 15x   Sit to Sand 15 reps  with pillow and blue foam     Knee/Hip Exercises: Supine   Terminal Knee Extension AROM;Strengthening;Right;2 sets;10 reps     Moist Heat Therapy   Number Minutes Moist Heat 15 Minutes   Moist Heat Location Lumbar Spine     Electrical Stimulation   Electrical Stimulation Location Lumbar and Rt gluteals   Electrical Stimulation Action IFC   Electrical Stimulation Parameters To tolerance   Electrical Stimulation Goals Pain     Manual Therapy   Manual Therapy Soft tissue mobilization   Manual therapy comments To Bil glutes and piriformis                  PT Short Term Goals - 07/13/16 1532      PT SHORT TERM GOAL #1   Title be independent in initial HEP   Time 4   Period Weeks   Status On-going     PT SHORT TERM GOAL #2   Title report a 30% reduction in LBP and Rt LE pain with sitting and standing    Time 4   Period Weeks   Status On-going     PT SHORT TERM GOAL #3   Title demonstrate symmetry with ambulation on level surface   Time 4   Period Weeks   Status On-going     PT SHORT TERM GOAL #4   Title demonstrate and verbalize correct body mechanics for ADLs and work tasks   Time 4   Period Weeks   Status On-going           PT Long Term Goals - 07/13/16 1532      PT  LONG TERM GOAL #1   Title be independent in advanced HEP   Time 8   Period Weeks   Status On-going     PT LONG TERM GOAL #2   Title reduce FOTO to < or = to 44% limitation  Time 8   Period Weeks   Status On-going     PT LONG TERM GOAL #3   Title report a 60% reduction in LBP and Rt LE pain with home and work tasks   Time 8   Period Weeks   Status On-going     PT LONG TERM GOAL #4   Title describe Rt LE pain as intermittent   Time 8   Period Weeks   Status On-going     PT LONG TERM GOAL #5   Title tolerate sitting for 45 minutes without limitation   Time 8   Period Weeks   Status On-going               Plan - 07/13/16 1617    Clinical Impression Statement Pt reports back pain today. Is shceduled to have an MRI this week. Pt continues to have weakness in Rt hip.  Some tenderness in Bil gluteals and piriformis. Pt able to toelrate all exercises well with no increase in pain. Will continue to strengthn core and Rt LE.    Rehab Potential Good   PT Frequency 2x / week   PT Duration 8 weeks   PT Treatment/Interventions ADLs/Self Care Home Management;Cryotherapy;Electrical Stimulation;Functional mobility training;Stair training;Ultrasound;Moist Heat;Therapeutic activities;Therapeutic exercise;Neuromuscular re-education;Patient/family education;Passive range of motion;Manual techniques;Dry needling;Taping   PT Next Visit Plan quad strengthening (review seated red band ex's as needed);  sit to stand from high surface;  Nu-step increase time;  possible leg press with close supervision on/off for safety secondary to knee give-way   Consulted and Agree with Plan of Care Patient      Patient will benefit from skilled therapeutic intervention in order to improve the following deficits and impairments:  Postural dysfunction, Decreased strength, Impaired flexibility, Decreased activity tolerance, Decreased range of motion, Decreased endurance, Decreased safety awareness, Improper  body mechanics, Pain  Visit Diagnosis: Right-sided low back pain with right-sided sciatica  Muscle weakness (generalized)     Problem List Patient Active Problem List   Diagnosis Date Noted  . Adjustment disorder with depressed mood 03/16/2016  . Pain in joint, ankle and foot 02/11/2015  . Postoperative state 03/07/2014  . Preventative health care 10/30/2013  . Prediabetes 03/19/2013  . Osteoporosis 01/30/2013  . Postoperative anemia due to acute blood loss 01/30/2013  . Diarrhea 01/30/2013  . Pigmented villonodular synovitis of left knee 12/30/2012  . Acute meniscal tear of knee 10/21/2012  . Chronic insomnia 09/05/2012  . Gout 09/05/2012  . Right shoulder pain 10/23/2011  . Hip pain, left 10/23/2011  . 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 CONSTIPATION 05/14/2009  . COLONIC POLYPS, HX OF 05/13/2009  . FATIGUE 02/11/2009  . DERMATITIS 03/05/2008  . Essential hypertension 10/10/2007  . FOOT PAIN, RIGHT 10/10/2007  . UNSPECIFIED DISORDER OF THYROID 10/03/2007  . OTHER ABNORMAL GLUCOSE 10/03/2007  . Hypothyroidism 09/17/2007  . HYPERCHOLESTEROLEMIA 09/17/2007  . Primary osteoarthritis of both hands 09/17/2007  . BURSITIS 09/17/2007  . BREAST IMPLANTS, BILATERAL, HX OF 09/17/2007  . LAMINECTOMY, LUMBAR, HX OF 09/17/2007    Mikle Bosworth PTA 07/13/2016, 5:05 PM  [facility] [address], [place], [place], [address] Phone: [phone]   Fax:  [phone]  Name: ALMEDIA UPLINGER MRN: [id number] Date of Birth: Dec 07, 1940

## 2016-07-14 ENCOUNTER — Ambulatory Visit: Payer: PPO | Admitting: Physical Therapy

## 2016-07-14 ENCOUNTER — Encounter: Payer: Self-pay | Admitting: Physical Therapy

## 2016-07-14 DIAGNOSIS — M5441 Lumbago with sciatica, right side: Secondary | ICD-10-CM

## 2016-07-14 DIAGNOSIS — M6281 Muscle weakness (generalized): Secondary | ICD-10-CM

## 2016-07-14 NOTE — Therapy (Signed)
[facility] [address] [place], [place], [address] Phone: [phone]   Fax:  [phone]  Physical Therapy Treatment  Patient Details  Name: Julie [name] MRN: [id number] Date of Birth: 10/08/1941 Referring Provider: Delano Metz, MD  Encounter Date: 07/14/2016      PT End of Session - 07/14/16 1612    Visit Number 6   Number of Visits 10   Date for PT Re-Evaluation 08/18/16   PT Start Time [id number]   PT Stop Time 1625   PT Time Calculation (min) 55 min   Activity Tolerance Patient tolerated treatment well   Behavior During Therapy [facility] for tasks assessed/performed      Past Medical History:  Diagnosis Date  . Arthritis    lower back shoulder  . Arthropathy, unspecified, site unspecified   . Cancer ([place])    skin -spots on face removed  . Contact dermatitis and other eczema, due to unspecified cause    History  . GERD (gastroesophageal reflux disease)    history - no current prob - no med  . 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 Other bursitis disorders    history  . Personal history of colonic polyps   . PONV (postoperative nausea and vomiting)   . Pure hypercholesterolemia   . SVD (spontaneous vaginal delivery)    x 1  . Unspecified disorder of thyroid   . Unspecified hypothyroidism     Past Surgical History:  Procedure Laterality Date  . BACK SURGERY  2002   L4/L5  . BLADDER SUSPENSION N/A 03/07/2014   Procedure: TRANSVAGINAL TAPE (TVT) PROCEDURE WITH CYSTO;  Surgeon: Princess Bruins, MD;  Location: [facility];  Service: Gynecology;  Laterality: N/A;  vaginal   . blephorplasty      bilateral   . BREAST SURGERY     x 2 breast implants, later removed  . COLONOSCOPY    . CYSTOCELE REPAIR N/A 03/07/2014   Procedure: ANTERIOR REPAIR (CYSTOCELE);  Surgeon: Princess Bruins, MD;  Location: [facility];  Service: Gynecology;  Laterality: N/A;  .  devaited septum surgery    . EYE SURGERY     bilateral cataract surgery  . KNEE ARTHROSCOPY  10/21/2012   right - Procedure: ARTHROSCOPY KNEE;  Surgeon: Mcarthur Rossetti, MD;  Location: [facility];  Service: Orthopedics;  Laterality: Left;  Left Knee Arthroscopy, Debridement, partial synovectomy  . LUMBAR LAMINECTOMY    . ROBOTIC ASSISTED LAPAROSCOPIC SACROCOLPOPEXY N/A 03/07/2014   Procedure: ROBOTIC ASSISTED LAPAROSCOPIC SACROCOLPOPEXY With Carrolyn Meiers;  Surgeon: Princess Bruins, MD;  Location: [facility];  Service: Gynecology;  Laterality: N/A;  . ROBOTIC ASSISTED SUPRACERVICAL HYSTERECTOMY WITH BILATERAL SALPINGO OOPHERECTOMY N/A 03/07/2014   Procedure: ROBOTIC ASSISTED SUPRACERVICAL HYSTERECTOMY WITH BILATERAL SALPINGO OOPHORECTOMY;  Surgeon: Princess Bruins, MD;  Location: [facility];  Service: Gynecology;  Laterality: N/A;  4 hrs.  Marland Kitchen TOTAL KNEE ARTHROPLASTY Left 12/30/2012   Procedure: TOTAL KNEE ARTHROPLASTY;  Surgeon: Mcarthur Rossetti, MD;  Location: [facility];  Service: Orthopedics;  Laterality: Left;  Left Total Knee Arthroplasty  . TUBAL LIGATION    . WISDOM TOOTH EXTRACTION      There were no vitals filed for this visit.      Subjective Assessment - 07/14/16 1533    Subjective Pt reports back feeling better today. Knee still feesl unsteady. Pt went to the gym this morning    Pertinent History skin cancer  Limitations Sitting;Standing;Walking   How long can you sit comfortably? 30 minutes   How long can you stand comfortably? feels unsteady   Diagnostic tests x-ray  06/22/16: lumbar DDD and severe lumbar scoliosis concave Lt   Patient Stated Goals reduce LBP, reduce Rt LE pain, stand for work   Currently in Pain? Yes   Pain Score 2    Pain Location Back   Pain Orientation Lower;Right   Pain Descriptors / Indicators Aching;Shooting   Pain Type Acute pain   Pain Radiating Towards Rt knee   Pain Onset 1 to 4 weeks ago   Pain Frequency Constant   Multiple Pain Sites No                          [facility] Adult PT Treatment/Exercise - 07/14/16 0001      Lumbar Exercises: Supine   Heel Slides 20 reps  on red ball   Bent Knee Raise 5 reps   Bridge 15 reps   Bridge Limitations with red ball   Other Supine Lumbar Exercises Ball squeeze with ab set     Knee/Hip Exercises: Stretches   Passive Hamstring Stretch Both;2 reps;10 seconds   Piriformis Stretch Both;2 reps;10 seconds     Knee/Hip Exercises: Aerobic   Nustep L1 x 7 mins  while therapist assesing pain     Knee/Hip Exercises: Standing   Hip Abduction AROM;Stengthening;Both;2 sets;10 reps   Hip Extension AROM;Stengthening;Both;2 sets;10 reps   Functional Squat 2 sets;10 reps  Sit to stand     Knee/Hip Exercises: Seated   Sit to Sand 15 reps  with pillow and blue foam     Knee/Hip Exercises: Supine   Terminal Knee Extension AROM;Strengthening;Right;2 sets;10 reps   Straight Leg Raises AROM;Strengthening;Both;3 sets;5 reps     Moist Heat Therapy   Number Minutes Moist Heat 15 Minutes   Moist Heat Location Lumbar Spine     Electrical Stimulation   Electrical Stimulation Location Lumbar and Rt gluteals   Electrical Stimulation Action IFC   Electrical Stimulation Parameters to toerlance   Electrical Stimulation Goals Pain                  PT Short Term Goals - 07/14/16 1630      PT SHORT TERM GOAL #1   Title be independent in initial HEP   Time 4   Period Weeks   Status Achieved     PT SHORT TERM GOAL #2   Title report a 30% reduction in LBP and Rt LE pain with sitting and standing            PT Long Term Goals - 07/13/16 1532      PT LONG TERM GOAL #1   Title be independent in advanced HEP   Time 8   Period Weeks   Status On-going     PT LONG TERM GOAL #2   Title reduce FOTO to < or = to 44% limitation   Time 8   Period Weeks   Status On-going     PT LONG TERM GOAL #3   Title report a 60% reduction in LBP and Rt LE pain with home and work  tasks   Time 8   Period Weeks   Status On-going     PT LONG TERM GOAL #4   Title describe Rt LE pain as intermittent   Time 8   Period Weeks   Status On-going  PT LONG TERM GOAL #5   Title tolerate sitting for 45 minutes without limitation   Time 8   Period Weeks   Status On-going               Plan - 07/14/16 1604    Clinical Impression Statement Pt reports back feeling better today but continues to have Rt knee pain and instability. Able to tolerate all exercises well. Will continue to increase core strengeh and LE stability for safety and balance,    Rehab Potential Good   PT Frequency 2x / week   PT Duration 8 weeks   PT Treatment/Interventions ADLs/Self Care Home Management;Cryotherapy;Electrical Stimulation;Functional mobility training;Stair training;Ultrasound;Moist Heat;Therapeutic activities;Therapeutic exercise;Neuromuscular re-education;Patient/family education;Passive range of motion;Manual techniques;Dry needling;Taping   PT Next Visit Plan quad strengthening (review seated red band ex's as needed);  sit to stand from high surface;  Nu-step increase time;  possible leg press with close supervision on/off for safety secondary to knee give-way   Consulted and Agree with Plan of Care Patient      Patient will benefit from skilled therapeutic intervention in order to improve the following deficits and impairments:  Postural dysfunction, Decreased strength, Impaired flexibility, Decreased activity tolerance, Decreased range of motion, Decreased endurance, Decreased safety awareness, Improper body mechanics, Pain  Visit Diagnosis: Right-sided low back pain with right-sided sciatica  Muscle weakness (generalized)     Problem List Patient Active Problem List   Diagnosis Date Noted  . Adjustment disorder with depressed mood 03/16/2016  . Pain in joint, ankle and foot 02/11/2015  . Postoperative state 03/07/2014  . Preventative health care 10/30/2013  .  Prediabetes 03/19/2013  . Osteoporosis 01/30/2013  . Postoperative anemia due to acute blood loss 01/30/2013  . Diarrhea 01/30/2013  . Pigmented villonodular synovitis of left knee 12/30/2012  . Acute meniscal tear of knee 10/21/2012  . Chronic insomnia 09/05/2012  . Gout 09/05/2012  . Right shoulder pain 10/23/2011  . Hip pain, left 10/23/2011  . 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 CONSTIPATION 05/14/2009  . COLONIC POLYPS, HX OF 05/13/2009  . FATIGUE 02/11/2009  . DERMATITIS 03/05/2008  . Essential hypertension 10/10/2007  . FOOT PAIN, RIGHT 10/10/2007  . UNSPECIFIED DISORDER OF THYROID 10/03/2007  . OTHER ABNORMAL GLUCOSE 10/03/2007  . Hypothyroidism 09/17/2007  . HYPERCHOLESTEROLEMIA 09/17/2007  . Primary osteoarthritis of both hands 09/17/2007  . BURSITIS 09/17/2007  . BREAST IMPLANTS, BILATERAL, HX OF 09/17/2007  . LAMINECTOMY, LUMBAR, HX OF 09/17/2007    Mikle Bosworth PTA 07/14/2016, 5:06 PM  [facility] [address] [place], [place], [address] Phone: [phone]   Fax:  [phone]  Name: Julie [name] MRN: [id number] Date of Birth: Jul 27, 1941

## 2016-07-15 NOTE — Telephone Encounter (Signed)
Rx refill sent to pharmacy. 

## 2016-07-18 ENCOUNTER — Ambulatory Visit
Admission: RE | Admit: 2016-07-18 | Discharge: 2016-07-18 | Disposition: A | Discharge status: Home / Self Care | Payer: PPO | Source: Ambulatory Visit | Attending: Orthopaedic Surgery | Admitting: Orthopaedic Surgery

## 2016-07-18 DIAGNOSIS — M545 Low back pain: Secondary | ICD-10-CM

## 2016-07-18 DIAGNOSIS — S3219XA Other fracture of sacrum, initial encounter for closed fracture: Secondary | ICD-10-CM | POA: Diagnosis not present

## 2016-07-20 ENCOUNTER — Ambulatory Visit: Payer: PPO | Admitting: Physical Therapy

## 2016-07-20 ENCOUNTER — Ambulatory Visit (INDEPENDENT_AMBULATORY_CARE_PROVIDER_SITE_OTHER): Payer: PPO | Admitting: Family Medicine

## 2016-07-20 ENCOUNTER — Encounter: Payer: Self-pay | Admitting: Physical Therapy

## 2016-07-20 VITALS — BP 120/72 | HR 63 | Temp 97.8°F | Ht 59.0 in | Wt 118.6 lb

## 2016-07-20 DIAGNOSIS — M6281 Muscle weakness (generalized): Secondary | ICD-10-CM

## 2016-07-20 DIAGNOSIS — I10 Essential (primary) hypertension: Secondary | ICD-10-CM | POA: Diagnosis not present

## 2016-07-20 DIAGNOSIS — M47816 Spondylosis without myelopathy or radiculopathy, lumbar region: Secondary | ICD-10-CM | POA: Insufficient documentation

## 2016-07-20 DIAGNOSIS — M5441 Lumbago with sciatica, right side: Secondary | ICD-10-CM | POA: Diagnosis not present

## 2016-07-20 DIAGNOSIS — E039 Hypothyroidism, unspecified: Secondary | ICD-10-CM | POA: Diagnosis not present

## 2016-07-20 DIAGNOSIS — F4321 Adjustment disorder with depressed mood: Secondary | ICD-10-CM | POA: Diagnosis not present

## 2016-07-20 DIAGNOSIS — M4726 Other spondylosis with radiculopathy, lumbar region: Secondary | ICD-10-CM | POA: Diagnosis not present

## 2016-07-20 NOTE — Progress Notes (Signed)
Pre visit review using our clinic review tool, if applicable. No additional management support is needed unless otherwise documented below in the visit note. 

## 2016-07-20 NOTE — Progress Notes (Signed)
Subjective:     Patient ID: Julie [name], female   DOB: [birth year], 75 y.o.   MRN: [id number]  HPI Patient seen for medical follow-up. She's had a couple recent falls including one at airport on recent trip. She attributes this to some weakness in her lower extremities. She's had some ongoing recent back pain right lumbar with radiation to the knee with occasional numbness to the knee region. She has been seen by orthopedic surgeon had recent MRI showed acute versus subacute sacral fracture and also significant degenerative changes specially L3-L4 L4-L5. Patient is currently in physical therapy.  She has known osteoporosis and is on Fosamax. History of depression which is stable on fluoxetine. She is reluctant to stop. She was recently started on gabapentin per orthopedist for her back pain. She is on thyroid replacement and most recent TSH was normal She is doing exercises at least 3-4 times per week focusing on lower extremity strengthening  Past Medical History:  Diagnosis Date  . Arthritis    lower back shoulder  . Arthropathy, unspecified, site unspecified   . Cancer ([place])    skin -spots on face removed  . Contact dermatitis and other eczema, due to unspecified cause    History  . GERD (gastroesophageal reflux disease)    history - no current prob - no med  . 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 Other bursitis disorders    history  . Personal history of colonic polyps   . PONV (postoperative nausea and vomiting)   . Pure hypercholesterolemia   . SVD (spontaneous vaginal delivery)    x 1  . Unspecified disorder of thyroid   . Unspecified hypothyroidism    Past Surgical History:  Procedure Laterality Date  . BACK SURGERY  2002   L4/L5  . BLADDER SUSPENSION N/A 03/07/2014   Procedure: TRANSVAGINAL TAPE (TVT) PROCEDURE WITH CYSTO;  Surgeon: Princess Bruins, MD;  Location: [facility];  Service: Gynecology;  Laterality: N/A;   vaginal   . blephorplasty      bilateral   . BREAST SURGERY     x 2 breast implants, later removed  . COLONOSCOPY    . CYSTOCELE REPAIR N/A 03/07/2014   Procedure: ANTERIOR REPAIR (CYSTOCELE);  Surgeon: Princess Bruins, MD;  Location: [facility];  Service: Gynecology;  Laterality: N/A;  . devaited septum surgery    . EYE SURGERY     bilateral cataract surgery  . KNEE ARTHROSCOPY  10/21/2012   right - Procedure: ARTHROSCOPY KNEE;  Surgeon: Mcarthur Rossetti, MD;  Location: [facility];  Service: Orthopedics;  Laterality: Left;  Left Knee Arthroscopy, Debridement, partial synovectomy  . LUMBAR LAMINECTOMY    . ROBOTIC ASSISTED LAPAROSCOPIC SACROCOLPOPEXY N/A 03/07/2014   Procedure: ROBOTIC ASSISTED LAPAROSCOPIC SACROCOLPOPEXY With Carrolyn Meiers;  Surgeon: Princess Bruins, MD;  Location: [facility];  Service: Gynecology;  Laterality: N/A;  . ROBOTIC ASSISTED SUPRACERVICAL HYSTERECTOMY WITH BILATERAL SALPINGO OOPHERECTOMY N/A 03/07/2014   Procedure: ROBOTIC ASSISTED SUPRACERVICAL HYSTERECTOMY WITH BILATERAL SALPINGO OOPHORECTOMY;  Surgeon: Princess Bruins, MD;  Location: [facility];  Service: Gynecology;  Laterality: N/A;  4 hrs.  Marland Kitchen TOTAL KNEE ARTHROPLASTY Left 12/30/2012   Procedure: TOTAL KNEE ARTHROPLASTY;  Surgeon: Mcarthur Rossetti, MD;  [facility];  Service: Orthopedics;  Laterality: Left;  Left Total Knee Arthroplasty  . TUBAL LIGATION    . WISDOM TOOTH EXTRACTION      reports that she has never smoked. She has  never used smokeless tobacco. She reports that she drinks alcohol. She reports that she does not use drugs. family history includes Cancer in her mother; Diabetes in her sister; Emphysema in her maternal aunt; Heart attack in her father; Stroke in her father. Allergies  Allergen Reactions  . Penicillins Anaphylaxis    REACTION: causes tongue to swell Has patient had a PCN reaction causing immediate rash, facial/tongue/throat swelling, SOB or lightheadedness with hypotension: Yes  swelling Has patient had a PCN reaction causing severe rash involving mucus membranes or skin necrosis:  Has patient had a PCN reaction that required hospitalization no Has patient had a PCN reaction occurring within the last 10 years: no If all of the above answers are "NO", then may proceed with Cephalosporin use.   . Codeine Nausea Only  . Lisinopril     REACTION: wheezing  . Percocet [Oxycodone-Acetaminophen] Nausea And Vomiting     Review of Systems  Constitutional: Negative for fatigue and unexpected weight change.  Eyes: Negative for visual disturbance.  Respiratory: Negative for cough, chest tightness, shortness of breath and wheezing.   Cardiovascular: Negative for chest pain, palpitations and leg swelling.  Gastrointestinal: Negative for abdominal pain.  Endocrine: Negative for polydipsia and polyuria.  Musculoskeletal: Positive for back pain.  Neurological: Negative for dizziness, seizures, syncope, weakness, light-headedness and headaches.       Objective:   Physical Exam  Constitutional: She appears well-developed and well-nourished.  Eyes: Pupils are equal, round, and reactive to light.  Neck: Neck supple. No JVD present. No thyromegaly present.  Cardiovascular: Normal rate and regular rhythm.  Exam reveals no gallop.   Pulmonary/Chest: Effort normal and breath sounds normal. No respiratory distress. She has no wheezes. She has no rales.  Musculoskeletal: She exhibits no edema.  Straight leg raise is negative  Neurological: She is alert.  She has somewhat diminished reflexes throughout lower extremities bilaterally. 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

## 2016-07-20 NOTE — Therapy (Signed)
[facility] [address], [place], [place], [address] Phone: [phone]   Fax:  [phone]  Physical Therapy Treatment  Patient Details  Name: Julie [name] MRN: [id number] Date of Birth: Feb 20, 1941 Referring Provider: Delano Metz, MD  Encounter Date: 07/20/2016      PT End of Session - 07/20/16 1508    Visit Number 7   Number of Visits 10   Date for PT Re-Evaluation 08/18/16   PT Start Time [id number]   PT Stop Time 1500   PT Time Calculation (min) 55 min   Activity Tolerance Patient tolerated treatment well   Behavior During Therapy [facility] for tasks assessed/performed      Past Medical History:  Diagnosis Date  . Arthritis    lower back shoulder  . Arthropathy, unspecified, site unspecified   . Cancer ([place])    skin -spots on face removed  . Contact dermatitis and other eczema, due to unspecified cause    History  . GERD (gastroesophageal reflux disease)    history - no current prob - no med  . 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 Other bursitis disorders    history  . Personal history of colonic polyps   . PONV (postoperative nausea and vomiting)   . Pure hypercholesterolemia   . SVD (spontaneous vaginal delivery)    x 1  . Unspecified disorder of thyroid   . Unspecified hypothyroidism     Past Surgical History:  Procedure Laterality Date  . BACK SURGERY  2002   L4/L5  . BLADDER SUSPENSION N/A 03/07/2014   Procedure: TRANSVAGINAL TAPE (TVT) PROCEDURE WITH CYSTO;  Surgeon: Princess Bruins, MD;  Location: [facility];  Service: Gynecology;  Laterality: N/A;  vaginal   . blephorplasty      bilateral   . BREAST SURGERY     x 2 breast implants, later removed  . COLONOSCOPY    . CYSTOCELE REPAIR N/A 03/07/2014   Procedure: ANTERIOR REPAIR (CYSTOCELE);  Surgeon: Princess Bruins, MD;  Location: [facility];  Service: Gynecology;  Laterality: N/A;  .  devaited septum surgery    . EYE SURGERY     bilateral cataract surgery  . KNEE ARTHROSCOPY  10/21/2012   right - Procedure: ARTHROSCOPY KNEE;  Surgeon: Mcarthur Rossetti, MD;  [facility];  Service: Orthopedics;  Laterality: Left;  Left Knee Arthroscopy, Debridement, partial synovectomy  . LUMBAR LAMINECTOMY    . ROBOTIC ASSISTED LAPAROSCOPIC SACROCOLPOPEXY N/A 03/07/2014   Procedure: ROBOTIC ASSISTED LAPAROSCOPIC SACROCOLPOPEXY With Carrolyn Meiers;  Surgeon: Princess Bruins, MD;  Location: [facility];  Service: Gynecology;  Laterality: N/A;  . ROBOTIC ASSISTED SUPRACERVICAL HYSTERECTOMY WITH BILATERAL SALPINGO OOPHERECTOMY N/A 03/07/2014   Procedure: ROBOTIC ASSISTED SUPRACERVICAL HYSTERECTOMY WITH BILATERAL SALPINGO OOPHORECTOMY;  Surgeon: Princess Bruins, MD;  Location: [facility];  Service: Gynecology;  Laterality: N/A;  4 hrs.  Marland Kitchen TOTAL KNEE ARTHROPLASTY Left 12/30/2012   Procedure: TOTAL KNEE ARTHROPLASTY;  Surgeon: Mcarthur Rossetti, MD;  [facility];  Service: Orthopedics;  Laterality: Left;  Left Total Knee Arthroplasty  . TUBAL LIGATION    . WISDOM TOOTH EXTRACTION      There were no vitals filed for this visit.      Subjective Assessment - 07/20/16 1407    Subjective Pt reports feeling confused today. Had an MRI on back and pt reports MD toler her it was a fractured sacrum.    Pertinent History  skin cancer   Limitations Sitting;Standing;Walking   How long can you sit comfortably? 30 minutes   How long can you stand comfortably? feels unsteady   Diagnostic tests x-ray  06/22/16: lumbar DDD and severe lumbar scoliosis concave Lt   Patient Stated Goals reduce LBP, reduce Rt LE pain, stand for work   Currently in Pain? Yes   Pain Score 5    Pain Location Back   Pain Orientation Lower;Right   Pain Descriptors / Indicators Aching;Shooting   Pain Type Acute pain                         [facility] Adult PT Treatment/Exercise - 07/20/16 0001      Lumbar  Exercises: Supine   Heel Slides 20 reps  on red ball   Bent Knee Raise 5 reps   Bridge 15 reps   Bridge Limitations with red ball   Other Supine Lumbar Exercises Ball squeeze with ab set     Knee/Hip Exercises: Aerobic   Nustep L1 x 7 mins  while therapist assesing pain     Knee/Hip Exercises: Supine   Terminal Knee Extension AROM;Strengthening;Right;2 sets;10 reps   Straight Leg Raises AROM;Strengthening;Both;3 sets;5 reps     Knee/Hip Exercises: Prone   Hamstring Curl 3 sets;10 reps  tactile cues to keep hip neutral     Modalities   Modalities Electrical Stimulation;Moist Heat     Moist Heat Therapy   Number Minutes Moist Heat 15 Minutes   Moist Heat Location Lumbar Spine     Electrical Stimulation   Electrical Stimulation Location Lumbar and Rt gluteals   Electrical Stimulation Action IFC   Electrical Stimulation Parameters to tolerance   Electrical Stimulation Goals Pain                  PT Short Term Goals - 07/20/16 1410      PT SHORT TERM GOAL #1   Title be independent in initial HEP   Time 4   Period Weeks   Status Achieved     PT SHORT TERM GOAL #2   Title report a 30% reduction in LBP and Rt LE pain with sitting and standing    Time 4   Period Weeks   Status On-going     PT SHORT TERM GOAL #3   Title demonstrate symmetry with ambulation on level surface   Time 4   Period Weeks   Status On-going     PT SHORT TERM GOAL #4   Title demonstrate and verbalize correct body mechanics for ADLs and work tasks   Time 4   Period Weeks   Status On-going           PT Long Term Goals - 07/20/16 1411      PT LONG TERM GOAL #1   Title be independent in advanced HEP   Time 8   Period Weeks   Status On-going     PT LONG TERM GOAL #2   Title reduce FOTO to < or = to 44% limitation   Time 8   Period Weeks   Status On-going     PT LONG TERM GOAL #3   Title report a 60% reduction in LBP and Rt LE pain with home and work tasks   Time 8    Period Weeks   Status On-going     PT LONG TERM GOAL #4   Title describe Rt LE pain as intermittent   Time  8   Period Weeks   Status On-going     PT LONG TERM GOAL #5   Title tolerate sitting for 45 minutes without limitation   Time 8   Period Weeks   Status On-going               Plan - 07/20/16 1713    Clinical Impression Statement Pt reports back hurting today. Had MRI which discovered sacral fracture at S3. Pt continues to have weak Lt LE. Weakness in Lt hamstrings evedent with prone hamstring curl. Able to tolerate all exercises well. Will continue to increase core stability and LE strength as tolerated.    Rehab Potential Good   PT Frequency 2x / week   PT Duration 8 weeks   PT Treatment/Interventions ADLs/Self Care Home Management;Cryotherapy;Electrical Stimulation;Functional mobility training;Stair training;Ultrasound;Moist Heat;Therapeutic activities;Therapeutic exercise;Neuromuscular re-education;Patient/family education;Passive range of motion;Manual techniques;Dry needling;Taping   PT Next Visit Plan Core stability, LE strengthening   Consulted and Agree with Plan of Care Patient      Patient will benefit from skilled therapeutic intervention in order to improve the following deficits and impairments:  Postural dysfunction, Decreased strength, Impaired flexibility, Decreased activity tolerance, Decreased range of motion, Decreased endurance, Decreased safety awareness, Improper body mechanics, Pain  Visit Diagnosis: Right-sided low back pain with right-sided sciatica  Muscle weakness (generalized)     Problem List Patient Active Problem List   Diagnosis Date Noted  . 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 Adjustment disorder with depressed mood 03/16/2016  . Pain in joint, ankle and foot 02/11/2015  . Postoperative state 03/07/2014  . Preventative health care 10/30/2013  . Prediabetes 03/19/2013  . Osteoporosis 01/30/2013  . Postoperative  anemia due to acute blood loss 01/30/2013  . Diarrhea 01/30/2013  . Pigmented villonodular synovitis of left knee 12/30/2012  . Acute meniscal tear of knee 10/21/2012  . Chronic insomnia 09/05/2012  . Gout 09/05/2012  . Right shoulder pain 10/23/2011  . Hip pain, left 10/23/2011  . 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 CONSTIPATION 05/14/2009  . COLONIC POLYPS, HX OF 05/13/2009  . FATIGUE 02/11/2009  . DERMATITIS 03/05/2008  . Essential hypertension 10/10/2007  . FOOT PAIN, RIGHT 10/10/2007  . UNSPECIFIED DISORDER OF THYROID 10/03/2007  . OTHER ABNORMAL GLUCOSE 10/03/2007  . Hypothyroidism 09/17/2007  . HYPERCHOLESTEROLEMIA 09/17/2007  . Primary osteoarthritis of both hands 09/17/2007  . BURSITIS 09/17/2007  . BREAST IMPLANTS, BILATERAL, HX OF 09/17/2007  . LAMINECTOMY, LUMBAR, HX OF 09/17/2007    Mikle Bosworth PTA 07/20/2016, 5:17 PM  [facility] [address] [place], [place], [address] Phone: [phone]   Fax:  [phone]  Name: Julie [name] MRN: [id number] Date of Birth: [birth year]

## 2016-07-21 ENCOUNTER — Encounter: Payer: Self-pay | Admitting: Physical Therapy

## 2016-07-21 ENCOUNTER — Ambulatory Visit: Payer: PPO | Admitting: Physical Therapy

## 2016-07-21 DIAGNOSIS — M5441 Lumbago with sciatica, right side: Secondary | ICD-10-CM

## 2016-07-21 DIAGNOSIS — M6281 Muscle weakness (generalized): Secondary | ICD-10-CM

## 2016-07-21 NOTE — Therapy (Signed)
[facility] [address], [place], [place], [address] Phone: [phone]   Fax:  [phone]  Physical Therapy Treatment  Patient Details  Name: Julie [name] MRN: [id number] Date of Birth: [birth year] Referring Provider: Delano Metz, MD  Encounter Date: 07/21/2016      PT End of Session - 07/21/16 1617    Visit Number 8   Number of Visits 10   Date for PT Re-Evaluation 08/18/16   PT Start Time 1532   PT Stop Time 1630   PT Time Calculation (min) 58 min   Activity Tolerance Patient tolerated treatment well   Behavior During Therapy [facility] for tasks assessed/performed      Past Medical History:  Diagnosis Date  . Arthritis    lower back shoulder  . Arthropathy, unspecified, site unspecified   . Cancer ([place])    skin -spots on face removed  . Contact dermatitis and other eczema, due to unspecified cause    History  . GERD (gastroesophageal reflux disease)    history - no current prob - no med  . 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 Other bursitis disorders    history  . Personal history of colonic polyps   . PONV (postoperative nausea and vomiting)   . Pure hypercholesterolemia   . SVD (spontaneous vaginal delivery)    x 1  . Unspecified disorder of thyroid   . Unspecified hypothyroidism     Past Surgical History:  Procedure Laterality Date  . BACK SURGERY  2002   L4/L5  . BLADDER SUSPENSION N/A 03/07/2014   Procedure: TRANSVAGINAL TAPE (TVT) PROCEDURE WITH CYSTO;  Surgeon: Princess Bruins, MD;  Location: [facility];  Service: Gynecology;  Laterality: N/A;  vaginal   . blephorplasty      bilateral   . BREAST SURGERY     x 2 breast implants, later removed  . COLONOSCOPY    . CYSTOCELE REPAIR N/A 03/07/2014   Procedure: ANTERIOR REPAIR (CYSTOCELE);  Surgeon: Princess Bruins, MD;  Location: [facility];  Service: Gynecology;  Laterality: N/A;  .  devaited septum surgery    . EYE SURGERY     bilateral cataract surgery  . KNEE ARTHROSCOPY  10/21/2012   right - Procedure: ARTHROSCOPY KNEE;  Surgeon: Mcarthur Rossetti, MD;  Location: [facility];  Service: Orthopedics;  Laterality: Left;  Left Knee Arthroscopy, Debridement, partial synovectomy  . LUMBAR LAMINECTOMY    . ROBOTIC ASSISTED LAPAROSCOPIC SACROCOLPOPEXY N/A 03/07/2014   Procedure: ROBOTIC ASSISTED LAPAROSCOPIC SACROCOLPOPEXY With Carrolyn Meiers;  Surgeon: Princess Bruins, MD;  Location: [facility];  Service: Gynecology;  Laterality: N/A;  . ROBOTIC ASSISTED SUPRACERVICAL HYSTERECTOMY WITH BILATERAL SALPINGO OOPHERECTOMY N/A 03/07/2014   Procedure: ROBOTIC ASSISTED SUPRACERVICAL HYSTERECTOMY WITH BILATERAL SALPINGO OOPHORECTOMY;  Surgeon: Princess Bruins, MD;  Location: [facility];  Service: Gynecology;  Laterality: N/A;  4 hrs.  Marland Kitchen TOTAL KNEE ARTHROPLASTY Left 12/30/2012   Procedure: TOTAL KNEE ARTHROPLASTY;  Surgeon: Mcarthur Rossetti, MD;  [facility];  Service: Orthopedics;  Laterality: Left;  Left Total Knee Arthroplasty  . TUBAL LIGATION    . WISDOM TOOTH EXTRACTION      There were no vitals filed for this visit.      Subjective Assessment - 07/21/16 1534    Subjective Pt reports feeling tired today due to already wokring out and swimming. Reports back feeling the same as yesterday.    How long can you  sit comfortably? 30 minutes   How long can you stand comfortably? feels unsteady   Diagnostic tests x-ray  06/22/16: lumbar DDD and severe lumbar scoliosis concave Lt   Patient Stated Goals reduce LBP, reduce Rt LE pain, stand for work   Currently in Pain? Yes   Pain Location Back   Pain Orientation Lower;Right   Pain Descriptors / Indicators Aching   Pain Type Acute pain   Pain Onset 1 to 4 weeks ago                         [facility] Adult PT Treatment/Exercise - 07/21/16 0001      Lumbar Exercises: Sidelying   Hip Abduction 20 reps   Hip Abduction Weights  (lbs) 2     Knee/Hip Exercises: Aerobic   Nustep L2 x 7 mins  while therapist assesing pain     Knee/Hip Exercises: Machines for Strengthening   Cybex Knee Extension 3x10  10#   Cybex Knee Flexion 3x10  10#   Total Gym Leg Press 3x10  seat 5 50#     Knee/Hip Exercises: Standing   Knee Flexion Strengthening;Both;2 sets;10 reps   Hip Abduction AROM;Stengthening;Both;2 sets;10 reps   Hip Extension AROM;Stengthening;Both;2 sets;10 reps   Lateral Step Up Right;20 reps;Hand Hold: 1;Step Height: 2"  verbal cues for technique   Forward Step Up Both;2 sets;10 reps;Hand Hold: 2;Step Height: 6"   Functional Squat 2 sets;10 reps  Sit to stand   Other Standing Knee Exercises Terminal Knee Extension x20  red band     Knee/Hip Exercises: Supine   Terminal Knee Extension AROM;Strengthening;Right;2 sets;10 reps  #4   Straight Leg Raises AROM;Strengthening;Both;3 sets;5 reps  2#     Modalities   Modalities Electrical Stimulation;Moist Heat     Moist Heat Therapy   Number Minutes Moist Heat 15 Minutes   Moist Heat Location Lumbar Spine     Electrical Stimulation   Electrical Stimulation Location Lumbar and Rt gluteals   Electrical Stimulation Action IFC   Electrical Stimulation Parameters to tolerance   Electrical Stimulation Goals Pain                  PT Short Term Goals - 07/20/16 1410      PT SHORT TERM GOAL #1   Title be independent in initial HEP   Time 4   Period Weeks   Status Achieved     PT SHORT TERM GOAL #2   Title report a 30% reduction in LBP and Rt LE pain with sitting and standing    Time 4   Period Weeks   Status On-going     PT SHORT TERM GOAL #3   Title demonstrate symmetry with ambulation on level surface   Time 4   Period Weeks   Status On-going     PT SHORT TERM GOAL #4   Title demonstrate and verbalize correct body mechanics for ADLs and work tasks   Time 4   Period Weeks   Status On-going           PT Long Term Goals -  07/20/16 1411      PT LONG TERM GOAL #1   Title be independent in advanced HEP   Time 8   Period Weeks   Status On-going     PT LONG TERM GOAL #2   Title reduce FOTO to < or = to 44% limitation   Time 8   Period Weeks  Status On-going     PT LONG TERM GOAL #3   Title report a 60% reduction in LBP and Rt LE pain with home and work tasks   Time 8   Period Weeks   Status On-going     PT LONG TERM GOAL #4   Title describe Rt LE pain as intermittent   Time 8   Period Weeks   Status On-going     PT LONG TERM GOAL #5   Title tolerate sitting for 45 minutes without limitation   Time 8   Period Weeks   Status On-going               Plan - 07/21/16 1605    Clinical Impression Statement Pt able to toelrate new exercises today. Pt has difficulty initiating quad movement on Rt LE. Will continue to increase Rt LE strength as tolerated. Therapist closley moentoring for pain and providing verbal cues for technique during treatment.    Rehab Potential Good   PT Frequency 2x / week   PT Duration 8 weeks   PT Treatment/Interventions ADLs/Self Care Home Management;Cryotherapy;Electrical Stimulation;Functional mobility training;Stair training;Ultrasound;Moist Heat;Therapeutic activities;Therapeutic exercise;Neuromuscular re-education;Patient/family education;Passive range of motion;Manual techniques;Dry needling;Taping   PT Next Visit Plan Core stability, Rt LE strengthening   Consulted and Agree with Plan of Care Patient      Patient will benefit from skilled therapeutic intervention in order to improve the following deficits and impairments:  Postural dysfunction, Decreased strength, Impaired flexibility, Decreased activity tolerance, Decreased range of motion, Decreased endurance, Decreased safety awareness, Improper body mechanics, Pain  Visit Diagnosis: Right-sided low back pain with right-sided sciatica  Muscle weakness (generalized)     Problem List Patient Active  Problem List   Diagnosis Date Noted  . 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 Adjustment disorder with depressed mood 03/16/2016  . Pain in joint, ankle and foot 02/11/2015  . Postoperative state 03/07/2014  . Preventative health care 10/30/2013  . Prediabetes 03/19/2013  . Osteoporosis 01/30/2013  . Postoperative anemia due to acute blood loss 01/30/2013  . Diarrhea 01/30/2013  . Pigmented villonodular synovitis of left knee 12/30/2012  . Acute meniscal tear of knee 10/21/2012  . Chronic insomnia 09/05/2012  . Gout 09/05/2012  . Right shoulder pain 10/23/2011  . Hip pain, left 10/23/2011  . 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 CONSTIPATION 05/14/2009  . COLONIC POLYPS, HX OF 05/13/2009  . FATIGUE 02/11/2009  . DERMATITIS 03/05/2008  . Essential hypertension 10/10/2007  . FOOT PAIN, RIGHT 10/10/2007  . UNSPECIFIED DISORDER OF THYROID 10/03/2007  . OTHER ABNORMAL GLUCOSE 10/03/2007  . Hypothyroidism 09/17/2007  . HYPERCHOLESTEROLEMIA 09/17/2007  . Primary osteoarthritis of both hands 09/17/2007  . BURSITIS 09/17/2007  . BREAST IMPLANTS, BILATERAL, HX OF 09/17/2007  . LAMINECTOMY, LUMBAR, HX OF 09/17/2007    Mikle Bosworth PTA 07/21/2016, 4:26 PM  [facility] [address], [place], [place], [address] Phone: [phone]   Fax:  [phone]  Name: MITZY COSTELLA MRN: [id number] Date of Birth: [birth year]

## 2016-07-27 ENCOUNTER — Encounter: Payer: Self-pay | Admitting: Physical Therapy

## 2016-07-27 ENCOUNTER — Ambulatory Visit: Payer: PPO | Admitting: Physical Therapy

## 2016-07-27 DIAGNOSIS — M5441 Lumbago with sciatica, right side: Secondary | ICD-10-CM | POA: Diagnosis not present

## 2016-07-27 DIAGNOSIS — M6281 Muscle weakness (generalized): Secondary | ICD-10-CM

## 2016-07-27 NOTE — Therapy (Signed)
[facility] [address] [place], [place], [address] Phone: [phone]   Fax:  [phone]  Physical Therapy Treatment  Patient Details  Name: Julie [name] MRN: [id number] Date of Birth: 21-Aug-1941 Referring Provider: Delano Metz, MD  Encounter Date: 07/27/2016      PT End of Session - 07/27/16 1617    Visit Number 9   Number of Visits 10   Date for PT Re-Evaluation 08/18/16   PT Start Time [id number]   PT Stop Time 1629   PT Time Calculation (min) 59 min   Activity Tolerance Patient tolerated treatment well   Behavior During Therapy [facility] for tasks assessed/performed      Past Medical History:  Diagnosis Date  . Arthritis    lower back shoulder  . Arthropathy, unspecified, site unspecified   . Cancer ([place])    skin -spots on face removed  . Contact dermatitis and other eczema, due to unspecified cause    History  . GERD (gastroesophageal reflux disease)    history - no current prob - no med  . 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 Other bursitis disorders    history  . Personal history of colonic polyps   . PONV (postoperative nausea and vomiting)   . Pure hypercholesterolemia   . SVD (spontaneous vaginal delivery)    x 1  . Unspecified disorder of thyroid   . Unspecified hypothyroidism     Past Surgical History:  Procedure Laterality Date  . BACK SURGERY  2002   L4/L5  . BLADDER SUSPENSION N/A 03/07/2014   Procedure: TRANSVAGINAL TAPE (TVT) PROCEDURE WITH CYSTO;  Surgeon: Princess Bruins, MD;  Location: [place] [facility];  Service: Gynecology;  Laterality: N/A;  vaginal   . blephorplasty      bilateral   . BREAST SURGERY     x 2 breast implants, later removed  . COLONOSCOPY    . CYSTOCELE REPAIR N/A 03/07/2014   Procedure: ANTERIOR REPAIR (CYSTOCELE);  Surgeon: Princess Bruins, MD;  Location: [place] [facility];  Service: Gynecology;  Laterality: N/A;  .  devaited septum surgery    . EYE SURGERY     bilateral cataract surgery  . KNEE ARTHROSCOPY  10/21/2012   right - Procedure: ARTHROSCOPY KNEE;  Surgeon: Mcarthur Rossetti, MD;  Location: [facility];  Service: Orthopedics;  Laterality: Left;  Left Knee Arthroscopy, Debridement, partial synovectomy  . LUMBAR LAMINECTOMY    . ROBOTIC ASSISTED LAPAROSCOPIC SACROCOLPOPEXY N/A 03/07/2014   Procedure: ROBOTIC ASSISTED LAPAROSCOPIC SACROCOLPOPEXY With Carrolyn Meiers;  Surgeon: Princess Bruins, MD;  Location: [place];  Service: Gynecology;  Laterality: N/A;  . ROBOTIC ASSISTED SUPRACERVICAL HYSTERECTOMY WITH BILATERAL SALPINGO OOPHERECTOMY N/A 03/07/2014   Procedure: ROBOTIC ASSISTED SUPRACERVICAL HYSTERECTOMY WITH BILATERAL SALPINGO OOPHORECTOMY;  Surgeon: Princess Bruins, MD;  Location: [facility];  Service: Gynecology;  Laterality: N/A;  4 hrs.  Marland Kitchen TOTAL KNEE ARTHROPLASTY Left 12/30/2012   Procedure: TOTAL KNEE ARTHROPLASTY;  Surgeon: Mcarthur Rossetti, MD;  Location: [facility];  Service: Orthopedics;  Laterality: Left;  Left Total Knee Arthroplasty  . TUBAL LIGATION    . WISDOM TOOTH EXTRACTION      There were no vitals filed for this visit.      Subjective Assessment - 07/27/16 1535    Subjective "This R leg is back to hurting again"   Diagnostic tests x-ray  06/22/16: lumbar DDD and severe lumbar scoliosis concave Lt   Patient  Stated Goals reduce LBP, reduce Rt LE pain, stand for work   Currently in Pain? Yes   Pain Score 7    Pain Location Back   Pain Orientation Right;Lower  goes down leg                         [facility] Adult PT Treatment/Exercise - 07/27/16 0001      Lumbar Exercises: Stretches   Passive Hamstring Stretch 4 reps;10 seconds   Piriformis Stretch 5 reps;10 seconds     Lumbar Exercises: Seated   Long Arc Quad on Chair Strengthening;10 reps;2 sets;Both   LAQ on Chair Weights (lbs) 2   Sit to Stand 10 reps  x2, pt leans to her L      Lumbar Exercises: Supine    Straight Leg Raise 10 reps;2 seconds  x2   Other Supine Lumbar Exercises Ball squeeze and bridge 2x10     Knee/Hip Exercises: Aerobic   Nustep L2 x 7 mins     Knee/Hip Exercises: Machines for Strengthening   Total Gym Leg Press 3x10; RLE only 2x10 no weight     Knee/Hip Exercises: Standing   Forward Step Up Both;2 sets;10 reps;Hand Hold: 2;Step Height: 6"   Other Standing Knee Exercises Standing march 2x10   HHA x1     Knee/Hip Exercises: Seated   Hamstring Curl 2 sets;Both;10 reps   Hamstring Limitations yellow tband      Modalities   Modalities Electrical Stimulation;Moist Heat     Moist Heat Therapy   Number Minutes Moist Heat 15 Minutes   Moist Heat Location Lumbar Spine     Electrical Stimulation   Electrical Stimulation Location Lumbar and Rt gluteals   Electrical Stimulation Action IFC   Electrical Stimulation Parameters to tolerance   Electrical Stimulation Goals Pain                  PT Short Term Goals - 07/20/16 1410      PT SHORT TERM GOAL #1   Title be independent in initial HEP   Time 4   Period Weeks   Status Achieved     PT SHORT TERM GOAL #2   Title report a 30% reduction in LBP and Rt LE pain with sitting and standing    Time 4   Period Weeks   Status On-going     PT SHORT TERM GOAL #3   Title demonstrate symmetry with ambulation on level surface   Time 4   Period Weeks   Status On-going     PT SHORT TERM GOAL #4   Title demonstrate and verbalize correct body mechanics for ADLs and work tasks   Time 4   Period Weeks   Status On-going           PT Long Term Goals - 07/20/16 1411      PT LONG TERM GOAL #1   Title be independent in advanced HEP   Time 8   Period Weeks   Status On-going     PT LONG TERM GOAL #2   Title reduce FOTO to < or = to 44% limitation   Time 8   Period Weeks   Status On-going     PT LONG TERM GOAL #3   Title report a 60% reduction in LBP and Rt LE pain with home and work tasks   Time 8    Period Weeks   Status On-going     PT LONG  TERM GOAL #4   Title describe Rt LE pain as intermittent   Time 8   Period Weeks   Status On-going     PT LONG TERM GOAL #5   Title tolerate sitting for 45 minutes without limitation   Time 8   Period Weeks   Status On-going               Plan - 07/27/16 1618    Clinical Impression Statement Pt able to complete all of today's intervention. Pt with some balance instability with standing march requiring HHA. Decrease RLE strength demo ed with step ups. Pt with no reports of increase pain with today's treatment.   Rehab Potential Good   PT Frequency 2x / week   PT Duration 8 weeks   PT Treatment/Interventions ADLs/Self Care Home Management;Cryotherapy;Electrical Stimulation;Functional mobility training;Stair training;Ultrasound;Moist Heat;Therapeutic activities;Therapeutic exercise;Neuromuscular re-education;Patient/family education;Passive range of motion;Manual techniques;Dry needling;Taping   PT Next Visit Plan Continue core stability, Rt LE strengthening      Patient will benefit from skilled therapeutic intervention in order to improve the following deficits and impairments:  Postural dysfunction, Decreased strength, Impaired flexibility, Decreased activity tolerance, Decreased range of motion, Decreased endurance, Decreased safety awareness, Improper body mechanics, Pain  Visit Diagnosis: Right-sided low back pain with right-sided sciatica  Muscle weakness (generalized)     Problem List Patient Active Problem List   Diagnosis Date Noted  . 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 Adjustment disorder with depressed mood 03/16/2016  . Pain in joint, ankle and foot 02/11/2015  . Postoperative state 03/07/2014  . Preventative health care 10/30/2013  . Prediabetes 03/19/2013  . Osteoporosis 01/30/2013  . Postoperative anemia due to acute blood loss 01/30/2013  . Diarrhea 01/30/2013  . Pigmented villonodular synovitis  of left knee 12/30/2012  . Acute meniscal tear of knee 10/21/2012  . Chronic insomnia 09/05/2012  . Gout 09/05/2012  . Right shoulder pain 10/23/2011  . Hip pain, left 10/23/2011  . 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 CONSTIPATION 05/14/2009  . COLONIC POLYPS, HX OF 05/13/2009  . FATIGUE 02/11/2009  . DERMATITIS 03/05/2008  . Essential hypertension 10/10/2007  . FOOT PAIN, RIGHT 10/10/2007  . UNSPECIFIED DISORDER OF THYROID 10/03/2007  . OTHER ABNORMAL GLUCOSE 10/03/2007  . Hypothyroidism 09/17/2007  . HYPERCHOLESTEROLEMIA 09/17/2007  . Primary osteoarthritis of both hands 09/17/2007  . BURSITIS 09/17/2007  . BREAST IMPLANTS, BILATERAL, HX OF 09/17/2007  . LAMINECTOMY, LUMBAR, HX OF 09/17/2007    Scot Jun, PTA 07/27/2016, 4:21 PM  [facility] [address] [place], [place], [address] Phone: [phone]   Fax:  [phone]  Name: Julie [name] MRN: [id number] Date of Birth: [birth year]

## 2016-07-28 ENCOUNTER — Ambulatory Visit: Payer: PPO | Admitting: Physical Therapy

## 2016-07-28 DIAGNOSIS — M5441 Lumbago with sciatica, right side: Secondary | ICD-10-CM

## 2016-07-28 DIAGNOSIS — M6281 Muscle weakness (generalized): Secondary | ICD-10-CM

## 2016-07-28 NOTE — Therapy (Signed)
[facility] [address], [place], [place], [address] Phone: [phone]   Fax:  [phone]  Physical Therapy Treatment  Patient Details  Name: Julie [name] MRN: [id number] Date of Birth: 02-Jul-1941 Referring Provider: Delano Metz, MD  Encounter Date: 07/28/2016      PT End of Session - 07/28/16 1653    Visit Number 10   Number of Visits 10   Date for PT Re-Evaluation 08/18/16   PT Start Time 0092   PT Stop Time 3300   PT Time Calculation (min) 45 min   Activity Tolerance Patient limited by pain      Past Medical History:  Diagnosis Date  . Arthritis    lower back shoulder  . Arthropathy, unspecified, site unspecified   . Cancer ([place])    skin -spots on face removed  . Contact dermatitis and other eczema, due to unspecified cause    History  . GERD (gastroesophageal reflux disease)    history - no current prob - no med  . 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 Other bursitis disorders    history  . Personal history of colonic polyps   . PONV (postoperative nausea and vomiting)   . Pure hypercholesterolemia   . SVD (spontaneous vaginal delivery)    x 1  . Unspecified disorder of thyroid   . Unspecified hypothyroidism     Past Surgical History:  Procedure Laterality Date  . BACK SURGERY  2002   L4/L5  . BLADDER SUSPENSION N/A 03/07/2014   Procedure: TRANSVAGINAL TAPE (TVT) PROCEDURE WITH CYSTO;  Surgeon: Princess Bruins, MD;  Location: [place];  Service: Gynecology;  Laterality: N/A;  vaginal   . blephorplasty      bilateral   . BREAST SURGERY     x 2 breast implants, later removed  . COLONOSCOPY    . CYSTOCELE REPAIR N/A 03/07/2014   Procedure: ANTERIOR REPAIR (CYSTOCELE);  Surgeon: Princess Bruins, MD;  Location: [facility];  Service: Gynecology;  Laterality: N/A;  . devaited septum surgery    . EYE SURGERY     bilateral cataract  surgery  . KNEE ARTHROSCOPY  10/21/2012   right - Procedure: ARTHROSCOPY KNEE;  Surgeon: Mcarthur Rossetti, MD;  Location: [facility];  Service: Orthopedics;  Laterality: Left;  Left Knee Arthroscopy, Debridement, partial synovectomy  . LUMBAR LAMINECTOMY    . ROBOTIC ASSISTED LAPAROSCOPIC SACROCOLPOPEXY N/A 03/07/2014   Procedure: ROBOTIC ASSISTED LAPAROSCOPIC SACROCOLPOPEXY With Carrolyn Meiers;  Surgeon: Princess Bruins, MD;  Location: [facility];  Service: Gynecology;  Laterality: N/A;  . ROBOTIC ASSISTED SUPRACERVICAL HYSTERECTOMY WITH BILATERAL SALPINGO OOPHERECTOMY N/A 03/07/2014   Procedure: ROBOTIC ASSISTED SUPRACERVICAL HYSTERECTOMY WITH BILATERAL SALPINGO OOPHORECTOMY;  Surgeon: Princess Bruins, MD;  Location: [facility];  Service: Gynecology;  Laterality: N/A;  4 hrs.  Marland Kitchen TOTAL KNEE ARTHROPLASTY Left 12/30/2012   Procedure: TOTAL KNEE ARTHROPLASTY;  Surgeon: Mcarthur Rossetti, MD;  Location: [facility];  Service: Orthopedics;  Laterality: Left;  Left Total Knee Arthroplasty  . TUBAL LIGATION    . WISDOM TOOTH EXTRACTION      There were no vitals filed for this visit.      Subjective Assessment - 07/28/16 1534    Subjective My right leg pain is back.  Radiates to my knee.  I'm back on pain medicine.   Unable to step up on step secondary to weakness but no recent knee give-way.  Currently in Pain? Yes   Pain Score 8    Pain Location Back   Pain Radiating Towards right lateral knee            [facility] PT Assessment - 07/28/16 0001      Observation/Other Assessments   Focus on Therapeutic Outcomes (FOTO)  61% limitation     Strength   Overall Strength Comments Rt LE 4/5 hip except 4-/5 hip flexion, Rt knee 4+/5 extension, knee flexion is 4+/5.  Lt LE is 4+/5 to 5/5                     [facility] Adult PT Treatment/Exercise - 07/28/16 0001      Lumbar Exercises: Supine   Other Supine Lumbar Exercises lumbar rotation with      Manual Therapy   Joint Mobilization gentle  lumbar distraction, right hip long axis distraction, hip inferior glides, sidelying neutral gapping and distraction grade 3 3x 30 sec each   Kinesiotex Facilitate Muscle     Kinesiotix   Facilitate Muscle  right lateral thigh and hip                  PT Short Term Goals - 07/28/16 1658      PT SHORT TERM GOAL #1   Title be independent in initial HEP   Status Achieved     PT SHORT TERM GOAL #2   Title report a 30% reduction in LBP and Rt LE pain with sitting and standing    Status Not Met     PT SHORT TERM GOAL #3   Title demonstrate symmetry with ambulation on level surface   Status Not Met     PT SHORT TERM GOAL #4   Title demonstrate and verbalize correct body mechanics for ADLs and work tasks   Status Achieved           PT Long Term Goals - 07/28/16 1658      PT LONG TERM GOAL #1   Title be independent in advanced HEP   Status Partially Met     PT LONG TERM GOAL #2   Title reduce FOTO to < or = to 44% limitation   Status Partially Met     PT LONG TERM GOAL #3   Title report a 60% reduction in LBP and Rt LE pain with home and work tasks   Status Not Met     PT LONG TERM GOAL #4   Title describe Rt LE pain as intermittent   Status Not Met     PT LONG TERM GOAL #5   Title tolerate sitting for 45 minutes without limitation   Status Not Met               Plan - 07/28/16 1653    Clinical Impression Statement The patient reports a set back with right LE radiculopathy which she attributes to traveling to [place].  The pain is moderate to severe.  Treatment focus today on decreasing peripheral symptoms.  She reports some relief post treatment session particularly with taping.  Patient was initially progressing but since this most recent flare up, there is minimal progress toward goals.  The patient requests to follow up with her MD on Monday.  Will discharge from PT at this time, per her request but will happy to resume PT with a new order if  indicated.     Clinical Impairments Affecting Rehab Potential osteoporosis      Patient will  benefit from skilled therapeutic intervention in order to improve the following deficits and impairments:     Visit Diagnosis: Right-sided low back pain with right-sided sciatica  Muscle weakness (generalized)       G-Codes - 08/16/2016 1659    Functional Assessment Tool Used FOTO   Functional Limitation Mobility: Walking and moving around   Mobility: Walking and Moving Around Goal Status [phone]) At least 40 percent but less than 60 percent impaired, limited or restricted   Mobility: Walking and Moving Around Discharge Status [phone]) At least 60 percent but less than 80 percent impaired, limited or restricted      Problem List Patient Active Problem List   Diagnosis Date Noted  . 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 Adjustment disorder with depressed mood 03/16/2016  . Pain in joint, ankle and foot 02/11/2015  . Postoperative state 03/07/2014  . Preventative health care 10/30/2013  . Prediabetes 03/19/2013  . Osteoporosis 01/30/2013  . Postoperative anemia due to acute blood loss 01/30/2013  . Diarrhea 01/30/2013  . Pigmented villonodular synovitis of left knee 12/30/2012  . Acute meniscal tear of knee 10/21/2012  . Chronic insomnia 09/05/2012  . Gout 09/05/2012  . Right shoulder pain 10/23/2011  . Hip pain, left 10/23/2011  . 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 CONSTIPATION 05/14/2009  . COLONIC POLYPS, HX OF 05/13/2009  . FATIGUE 02/11/2009  . DERMATITIS 03/05/2008  . Essential hypertension 10/10/2007  . FOOT PAIN, RIGHT 10/10/2007  . UNSPECIFIED DISORDER OF THYROID 10/03/2007  . OTHER ABNORMAL GLUCOSE 10/03/2007  . Hypothyroidism 09/17/2007  . HYPERCHOLESTEROLEMIA 09/17/2007  . Primary osteoarthritis of both hands 09/17/2007  . BURSITIS 09/17/2007  . BREAST IMPLANTS, BILATERAL, HX OF 09/17/2007  . LAMINECTOMY, LUMBAR, HX OF 09/17/2007    Alvera Singh 2016-08-16, 7:41 PM  [facility] [address], [place], [place], [address] Phone: [phone]   Fax:  [phone]  Name: ANAHIS FURGESON MRN: [id number] Date of Birth: 1941-10-15

## 2016-07-28 NOTE — Therapy (Signed)
[facility] [address] [place], [place], [address] Phone: [phone]   Fax:  [phone]  Physical Therapy Treatment/Discharge Summary  Patient Details  Name: Julie [name] MRN: [id number] Date of Birth: 07/03/1941 Referring Provider: Delano Metz, MD  Encounter Date: 07/28/2016      PT End of Session - 07/28/16 1653    Visit Number 10   Number of Visits 10   Date for PT Re-Evaluation 08/18/16   PT Start Time 8527   PT Stop Time 7824   PT Time Calculation (min) 45 min   Activity Tolerance Patient limited by pain      Past Medical History:  Diagnosis Date  . Arthritis    lower back shoulder  . Arthropathy, unspecified, site unspecified   . Cancer ([place])    skin -spots on face removed  . Contact dermatitis and other eczema, due to unspecified cause    History  . GERD (gastroesophageal reflux disease)    history - no current prob - no med  . 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 Other bursitis disorders    history  . Personal history of colonic polyps   . PONV (postoperative nausea and vomiting)   . Pure hypercholesterolemia   . SVD (spontaneous vaginal delivery)    x 1  . Unspecified disorder of thyroid   . Unspecified hypothyroidism     Past Surgical History:  Procedure Laterality Date  . BACK SURGERY  2002   L4/L5  . BLADDER SUSPENSION N/A 03/07/2014   Procedure: TRANSVAGINAL TAPE (TVT) PROCEDURE WITH CYSTO;  Surgeon: Princess Bruins, MD;  Location: [facility];  Service: Gynecology;  Laterality: N/A;  vaginal   . blephorplasty      bilateral   . BREAST SURGERY     x 2 breast implants, later removed  . COLONOSCOPY    . CYSTOCELE REPAIR N/A 03/07/2014   Procedure: ANTERIOR REPAIR (CYSTOCELE);  Surgeon: Princess Bruins, MD;  Location: [facility];  Service: Gynecology;  Laterality: N/A;  . devaited septum surgery    . EYE SURGERY     bilateral cataract surgery  . KNEE ARTHROSCOPY  10/21/2012   right - Procedure: ARTHROSCOPY KNEE;  Surgeon: Mcarthur Rossetti, MD;  Location: [facility];  Service: Orthopedics;  Laterality: Left;  Left Knee Arthroscopy, Debridement, partial synovectomy  . LUMBAR LAMINECTOMY    . ROBOTIC ASSISTED LAPAROSCOPIC SACROCOLPOPEXY N/A 03/07/2014   Procedure: ROBOTIC ASSISTED LAPAROSCOPIC SACROCOLPOPEXY With Carrolyn Meiers;  Surgeon: Princess Bruins, MD;  Location: [facility];  Service: Gynecology;  Laterality: N/A;  . ROBOTIC ASSISTED SUPRACERVICAL HYSTERECTOMY WITH BILATERAL SALPINGO OOPHERECTOMY N/A 03/07/2014   Procedure: ROBOTIC ASSISTED SUPRACERVICAL HYSTERECTOMY WITH BILATERAL SALPINGO OOPHORECTOMY;  Surgeon: Princess Bruins, MD;  Location: [facility];  Service: Gynecology;  Laterality: N/A;  4 hrs.  Marland Kitchen TOTAL KNEE ARTHROPLASTY Left 12/30/2012   Procedure: TOTAL KNEE ARTHROPLASTY;  Surgeon: Mcarthur Rossetti, MD;  Location: [facility];  Service: Orthopedics;  Laterality: Left;  Left Total Knee Arthroplasty  . TUBAL LIGATION    . WISDOM TOOTH EXTRACTION      There were no vitals filed for this visit.      Subjective Assessment - 07/28/16 1534    Subjective My right leg pain is back.  Radiates to my knee.  I'm back on pain medicine.   Unable to step up on step secondary to weakness but no recent knee give-way.  Currently in Pain? Yes   Pain Score 8    Pain Location Back   Pain Radiating Towards right lateral knee            [facility] PT Assessment - 07/28/16 0001      Observation/Other Assessments   Focus on Therapeutic Outcomes (FOTO)  61% limitation     Strength   Overall Strength Comments Rt LE 4/5 hip except 4-/5 hip flexion, Rt knee 4+/5 extension, knee flexion is 4+/5.  Lt LE is 4+/5 to 5/5                     [facility] Adult PT Treatment/Exercise - 07/28/16 0001      Lumbar Exercises: Supine   Other Supine Lumbar Exercises lumbar rotation with green ball 15x and LE flexion and  exten rolls 15x   Other Supine Lumbar Exercises Discussion of aquatic dangling for gentle lumbar distraction and focus on decompressive ex's     Manual Therapy   Joint Mobilization gentle lumbar distraction, right hip long axis distraction, hip inferior glides, sidelying neutral gapping and distraction grade 3 3x 30 sec each   Kinesiotex Facilitate Muscle     Kinesiotix   Facilitate Muscle  right lateral thigh and hip                  PT Short Term Goals - 07/28/16 1658      PT SHORT TERM GOAL #1   Title be independent in initial HEP   Status Achieved     PT SHORT TERM GOAL #2   Title report a 30% reduction in LBP and Rt LE pain with sitting and standing    Status Not Met     PT SHORT TERM GOAL #3   Title demonstrate symmetry with ambulation on level surface   Status Not Met     PT SHORT TERM GOAL #4   Title demonstrate and verbalize correct body mechanics for ADLs and work tasks   Status Achieved           PT Long Term Goals - 07/28/16 1658      PT LONG TERM GOAL #1   Title be independent in advanced HEP   Status Partially Met     PT LONG TERM GOAL #2   Title reduce FOTO to < or = to 44% limitation   Status Partially Met     PT LONG TERM GOAL #3   Title report a 60% reduction in LBP and Rt LE pain with home and work tasks   Status Not Met     PT LONG TERM GOAL #4   Title describe Rt LE pain as intermittent   Status Not Met     PT LONG TERM GOAL #5   Title tolerate sitting for 45 minutes without limitation   Status Not Met               Plan - 07/28/16 1653    Clinical Impression Statement The patient reports a set back with right LE radiculopathy which she attributes to traveling to [place].  The pain is moderate to severe.  Treatment focus today on decreasing peripheral symptoms.  She reports some relief post treatment session particularly with taping.  Patient was initially progressing but since this most recent flare up, there is  minimal progress toward goals.  The patient requests to follow up with her MD on Monday.  Will discharge from PT at this time, per her request but  will happy to resume PT with a new order if indicated.     Clinical Impairments Affecting Rehab Potential osteoporosis      Patient will benefit from skilled therapeutic intervention in order to improve the following deficits and impairments:     Visit Diagnosis: Right-sided low back pain with right-sided sciatica  Muscle weakness (generalized)       G-Codes - 07-31-2016 1659    Functional Assessment Tool Used FOTO   Functional Limitation Mobility: Walking and moving around   Mobility: Walking and Moving Around Goal Status [phone]) At least 40 percent but less than 60 percent impaired, limited or restricted   Mobility: Walking and Moving Around Discharge Status [phone]) At least 60 percent but less than 80 percent impaired, limited or restricted     PHYSICAL THERAPY DISCHARGE SUMMARY  Visits from Start of Care: 10  Current functional level related to goals / functional outcomes: See clinical impression statement above   Remaining deficits: See above    Education / Equipment: HEP  Plan: Patient agrees to discharge.  Patient goals were partially met. Patient is being discharged due to lack of progress.  ?????         Problem List Patient Active Problem List   Diagnosis Date Noted  . 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 Adjustment disorder with depressed mood 03/16/2016  . Pain in joint, ankle and foot 02/11/2015  . Postoperative state 03/07/2014  . Preventative health care 10/30/2013  . Prediabetes 03/19/2013  . Osteoporosis 01/30/2013  . Postoperative anemia due to acute blood loss 01/30/2013  . Diarrhea 01/30/2013  . Pigmented villonodular synovitis of left knee 12/30/2012  . Acute meniscal tear of knee 10/21/2012  . Chronic insomnia 09/05/2012  . Gout 09/05/2012  . Right shoulder pain 10/23/2011  . Hip pain,  left 10/23/2011  . 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 CONSTIPATION 05/14/2009  . COLONIC POLYPS, HX OF 05/13/2009  . FATIGUE 02/11/2009  . DERMATITIS 03/05/2008  . Essential hypertension 10/10/2007  . FOOT PAIN, RIGHT 10/10/2007  . UNSPECIFIED DISORDER OF THYROID 10/03/2007  . OTHER ABNORMAL GLUCOSE 10/03/2007  . Hypothyroidism 09/17/2007  . HYPERCHOLESTEROLEMIA 09/17/2007  . Primary osteoarthritis of both hands 09/17/2007  . BURSITIS 09/17/2007  . BREAST IMPLANTS, BILATERAL, HX OF 09/17/2007  . LAMINECTOMY, LUMBAR, HX OF 09/17/2007   Ruben Im, PT 07/31/16 7:45 PM Phone: [phone] Fax: [phone]  Alvera Singh 2016/07/31, 7:43 PM  [facility] [address], [place], [place], [address] Phone: [phone]   Fax:  [phone]  Name: Julie [name] MRN: [id number] Date of Birth: [birth year]

## 2016-07-30 ENCOUNTER — Encounter: Payer: Self-pay | Admitting: Family Medicine

## 2016-07-30 ENCOUNTER — Ambulatory Visit (INDEPENDENT_AMBULATORY_CARE_PROVIDER_SITE_OTHER): Payer: PPO | Admitting: Family Medicine

## 2016-07-30 VITALS — BP 150/74 | HR 64 | Temp 98.0°F | Wt 118.6 lb

## 2016-07-30 DIAGNOSIS — R590 Localized enlarged lymph nodes: Secondary | ICD-10-CM

## 2016-07-30 DIAGNOSIS — R599 Enlarged lymph nodes, unspecified: Secondary | ICD-10-CM

## 2016-07-30 DIAGNOSIS — M5431 Sciatica, right side: Secondary | ICD-10-CM | POA: Diagnosis not present

## 2016-07-30 MED ORDER — MELOXICAM 7.5 MG PO TABS: 7.5000 mg | ORAL_TABLET | Freq: Every day | ORAL | 0 refills | Status: DC

## 2016-07-30 NOTE — Patient Instructions (Addendum)
Please take meloxicam as directed and follow up with your orthopedic physician on Monday as directed. Please take this in place of ibuprofen. If symptoms do not improve or worsen, follow up for further evaluation. Follow up in one month.

## 2016-07-30 NOTE — Progress Notes (Signed)
Subjective:    Patient ID: Julie [name], female    DOB: 06-Mar-1941, 75 y.o.   MRN: [id number]  HPI  Julie [name] is a 75 year old female who presents today with right sided low back pain with radiation of pain to knee that has been present for > 1 month . She has experienced this ongoing discomfort  with associated  weakness in her lower extremities for which she was referred to PT. She has been under the care of an orthopedic surgeon with a recent MRI which showed acute versus subacute sacral fracture and degenerative changes in L3, L4, L5. She has just recently completed physical therapy. She has an appointment with her orthopedic physician in 4 days. She reports hydrocodone use which has been prescribed by her orthopedic specialist and states that this only is used at night for relief.  There are no exacerbating or relieving factors noted with the exception of swimming as noted below.  She denies fever, chills, sweats, night sweats, loss of control of her bowels/bladder. No numbness or tingling present today. She denies any recent falls.  She has a history of osteoporosis and is on Fosamax.  She is doing strengthening exercises up to 4 times/week and reports that these have provided limited benefit. Swimming has provided better benefit.  She denies use of gabapentin as she is concerned this may make her dizzy. This was prescribed by her orthopedic provider in the past for back pain.   Review of Systems  Constitutional: Negative for chills, fatigue and fever.  Eyes: Negative for visual disturbance.  Respiratory: Negative for cough, shortness of breath and wheezing.   Cardiovascular: Negative for chest pain and palpitations.  Gastrointestinal: Negative for abdominal pain, diarrhea, nausea and vomiting.  Genitourinary: Negative for dysuria.  Musculoskeletal: Positive for back pain.  Skin: Negative for rash.  Neurological: Negative for dizziness, syncope, weakness, light-headedness  and headaches.   Past Medical History:  Diagnosis Date  . Arthritis    lower back shoulder  . Arthropathy, unspecified, site unspecified   . Cancer ([place])    skin -spots on face removed  . Contact dermatitis and other eczema, due to unspecified cause    History  . GERD (gastroesophageal reflux disease)    history - no current prob - no med  . 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 Other bursitis disorders    history  . Personal history of colonic polyps   . PONV (postoperative nausea and vomiting)   . Pure hypercholesterolemia   . SVD (spontaneous vaginal delivery)    x 1  . Unspecified disorder of thyroid   . Unspecified hypothyroidism      Social History   Social History  . Marital status: Married    Spouse name: N/A  . Number of children: N/A  . Years of education: N/A   Occupational History  . Not on file.   Social History Main Topics  . Smoking status: Never Smoker  . Smokeless tobacco: Never Used  . Alcohol use Yes     Comment: rare  . Drug use: No  . Sexual activity: Yes    Birth control/ protection: Post-menopausal   Other Topics Concern  . Not on file   Social History Narrative  . No narrative on file    Past Surgical History:  Procedure Laterality Date  . BACK SURGERY  2002   L4/L5  . BLADDER SUSPENSION N/A  03/07/2014   Procedure: TRANSVAGINAL TAPE (TVT) PROCEDURE WITH CYSTO;  Surgeon: Princess Bruins, MD;  Location: [facility];  Service: Gynecology;  Laterality: N/A;  vaginal   . blephorplasty      bilateral   . BREAST SURGERY     x 2 breast implants, later removed  . COLONOSCOPY    . CYSTOCELE REPAIR N/A 03/07/2014   Procedure: ANTERIOR REPAIR (CYSTOCELE);  Surgeon: Princess Bruins, MD;  Location: [facility];  Service: Gynecology;  Laterality: N/A;  . devaited septum surgery    . EYE SURGERY     bilateral cataract surgery  . KNEE ARTHROSCOPY  10/21/2012   right - Procedure: ARTHROSCOPY KNEE;   Surgeon: Mcarthur Rossetti, MD;  Location: [facility];  Service: Orthopedics;  Laterality: Left;  Left Knee Arthroscopy, Debridement, partial synovectomy  . LUMBAR LAMINECTOMY    . ROBOTIC ASSISTED LAPAROSCOPIC SACROCOLPOPEXY N/A 03/07/2014   Procedure: ROBOTIC ASSISTED LAPAROSCOPIC SACROCOLPOPEXY With Carrolyn Meiers;  Surgeon: Princess Bruins, MD;  Location: [facility];  Service: Gynecology;  Laterality: N/A;  . ROBOTIC ASSISTED SUPRACERVICAL HYSTERECTOMY WITH BILATERAL SALPINGO OOPHERECTOMY N/A 03/07/2014   Procedure: ROBOTIC ASSISTED SUPRACERVICAL HYSTERECTOMY WITH BILATERAL SALPINGO OOPHORECTOMY;  Surgeon: Princess Bruins, MD;  Location: [facility];  Service: Gynecology;  Laterality: N/A;  4 hrs.  Marland Kitchen TOTAL KNEE ARTHROPLASTY Left 12/30/2012   Procedure: TOTAL KNEE ARTHROPLASTY;  Surgeon: Mcarthur Rossetti, MD;  Location: [facility];  Service: Orthopedics;  Laterality: Left;  Left Total Knee Arthroplasty  . TUBAL LIGATION    . WISDOM TOOTH EXTRACTION      Family History  Problem Relation Age of Onset  . Cancer Mother     breast and lung  . Stroke Father   . Heart attack Father   . Diabetes Sister   . Emphysema Maternal Aunt   . Colon cancer Neg Hx     Allergies  Allergen Reactions  . Penicillins Anaphylaxis    REACTION: causes tongue to swell Has patient had a PCN reaction causing immediate rash, facial/tongue/throat swelling, SOB or lightheadedness with hypotension: Yes swelling Has patient had a PCN reaction causing severe rash involving mucus membranes or skin necrosis:  Has patient had a PCN reaction that required hospitalization no Has patient had a PCN reaction occurring within the last 10 years: no If all of the above answers are "NO", then may proceed with Cephalosporin use.   . Codeine Nausea Only  . Lisinopril     REACTION: wheezing  . Percocet [Oxycodone-Acetaminophen] Nausea And Vomiting    Current Outpatient Prescriptions on File Prior to Visit  Medication Sig Dispense Refill    . alendronate (FOSAMAX) 70 MG tablet Take 1 tablet (70 mg total) by mouth every 7 (seven) days. Take with a full glass of water on an empty stomach. 4 tablet 11  . Cholecalciferol 5000 UNITS capsule Take 5,000 Units by mouth daily.    . Cyanocobalamin (VITAMIN B-12 PO) Take 5,000 mcg by mouth daily.    Marland Kitchen FLUoxetine (PROZAC) 20 MG tablet Take 1 tablet (20 mg total) by mouth daily. 30 tablet 6  . gabapentin (NEURONTIN) 300 MG capsule Take 300 mg by mouth 3 (three) times daily.    Marland Kitchen KRILL OIL PO Take 1 tablet by mouth daily.     Marland Kitchen omeprazole (PRILOSEC) 40 MG capsule take 1 capsule by mouth once daily 90 capsule 0  . thyroid (ARMOUR THYROID) 30 MG tablet Take 1 tablet (30 mg total) by mouth daily. 90 tablet 1  . tiZANidine (ZANAFLEX) 4  MG tablet Take 4 mg by mouth every 6 (six) hours as needed for muscle spasms.    . valACYclovir (VALTREX) 1000 MG tablet     . VITAMIN E PO Take 1 tablet by mouth daily.     No current facility-administered medications on file prior to visit.     BP (!) 150/74 (BP Location: Right Arm, Patient Position: Sitting, Cuff Size: Normal)   Pulse 64   Temp 98 F (36.7 C) (Oral)   Wt 118 lb 9.6 oz (53.8 kg)   SpO2 97%   BMI 23.95 kg/m        Objective:   Physical Exam  Constitutional: She is oriented to person, place, and time. She appears well-developed and well-nourished.  Eyes: Pupils are equal, round, and reactive to light. No scleral icterus.  Neck: Neck supple.  Cardiovascular: Normal rate and regular rhythm.   Pulmonary/Chest: Effort normal and breath sounds normal. She has no wheezes. She has no rales.  Abdominal: Soft. Bowel sounds are normal. There is no tenderness. There is no rebound.  Musculoskeletal:  She has mildly diminished reflexes in lower extremities bilaterally. She has weakness bilaterally which remains unchanged from previous visits.   Lymphadenopathy:    She has no cervical adenopathy.       Right: Inguinal adenopathy present.  Right  inguinal lymphadenopathy present and is reported as tender to palpation. Lymph node is  Movable and mildly enlarged  Neurological: She is alert and oriented to person, place, and time.  Skin: Skin is warm and dry. No rash noted.  Psychiatric: She has a normal mood and affect. Her behavior is normal. Judgment and thought content normal.          Assessment & Plan:  1. Sciatica of right side Negative SLR; symptoms are unchanged from previous exam; PT is complete. Patient is followed by orthopedics for this and recent MRI findings are noted above. Will provide a short course of meloxicam with recommended follow up in 4 days with orthopedics. - meloxicam (MOBIC) 7.5 MG tablet; Take 1 tablet (7.5 mg total) by mouth daily.  Dispense: 30 tablet; Refill: 0  2. Inguinal lymphadenopathy Mildly enlarged right sided lymphadenopathy present upon exam. Exam and history are reassuring. Dr. Yong Channel was consulted and he agreed with plan to monitor in one month. If enlargement remains, will consider imaging.  Advised patient to establish care with a PCP for complete follow up and management of chronic conditions. Patient voiced understanding and agreed with plan.  Delano Metz, FNP-C

## 2016-08-03 ENCOUNTER — Ambulatory Visit (INDEPENDENT_AMBULATORY_CARE_PROVIDER_SITE_OTHER): Payer: PPO | Admitting: Orthopaedic Surgery

## 2016-08-03 DIAGNOSIS — M545 Low back pain: Secondary | ICD-10-CM

## 2016-08-05 DIAGNOSIS — M5416 Radiculopathy, lumbar region: Secondary | ICD-10-CM | POA: Diagnosis not present

## 2016-08-05 DIAGNOSIS — M4120 Other idiopathic scoliosis, site unspecified: Secondary | ICD-10-CM | POA: Diagnosis not present

## 2016-08-10 DIAGNOSIS — M5416 Radiculopathy, lumbar region: Secondary | ICD-10-CM | POA: Diagnosis not present

## 2016-08-10 DIAGNOSIS — M4120 Other idiopathic scoliosis, site unspecified: Secondary | ICD-10-CM | POA: Diagnosis not present

## 2016-08-24 ENCOUNTER — Telehealth (INDEPENDENT_AMBULATORY_CARE_PROVIDER_SITE_OTHER): Payer: Self-pay | Admitting: Orthopaedic Surgery

## 2016-08-24 NOTE — Telephone Encounter (Signed)
Pt. Called and wants to know if Dr. Ninfa Linden can rule out PVNS prior to her having back surgery.  She has an appointment with her back doctor on 08/26/16.   Please call pt. Back at [phone].

## 2016-08-24 NOTE — Telephone Encounter (Signed)
Needs a MRI to rule out PVNS

## 2016-08-26 DIAGNOSIS — M5416 Radiculopathy, lumbar region: Secondary | ICD-10-CM | POA: Diagnosis not present

## 2016-08-26 DIAGNOSIS — M5126 Other intervertebral disc displacement, lumbar region: Secondary | ICD-10-CM | POA: Diagnosis not present

## 2016-08-26 DIAGNOSIS — M4120 Other idiopathic scoliosis, site unspecified: Secondary | ICD-10-CM | POA: Diagnosis not present

## 2016-08-31 ENCOUNTER — Encounter: Payer: Self-pay | Admitting: Family Medicine

## 2016-08-31 ENCOUNTER — Ambulatory Visit (INDEPENDENT_AMBULATORY_CARE_PROVIDER_SITE_OTHER): Payer: PPO | Admitting: Family Medicine

## 2016-08-31 VITALS — BP 142/80 | HR 62 | Temp 97.7°F | Wt 118.0 lb

## 2016-08-31 DIAGNOSIS — R590 Localized enlarged lymph nodes: Secondary | ICD-10-CM

## 2016-08-31 DIAGNOSIS — M5431 Sciatica, right side: Secondary | ICD-10-CM | POA: Diagnosis not present

## 2016-08-31 NOTE — Progress Notes (Signed)
Pre visit review using our clinic review tool, if applicable. No additional management support is needed unless otherwise documented below in the visit note. 

## 2016-08-31 NOTE — Progress Notes (Signed)
Subjective:    Patient ID: Julie [name], female    DOB: 1941-05-25, 75 y.o.   MRN: [id number]  HPI  Julie [name] is a 75 year old female who presents today for follow up of right sided inguinal lymphadenopathy and low back pain and sciatica of her right side.     Associated discomfort of right sided low back pain that is noted in the morning but remains present during the day and rated as a 6 and noted as an ache.  She is followed by orthopedics and will return to orthopedics for a steroid guided injection.  She is being evaluated for surgery and has an upcoming bone density scan as she has a history of osteoporosis with treatment of Fosamax. Results of testing are being conducted by her orthopedic surgeon to evaluate if she is a candidate for surgery. Treatment for discomfort and associated weakness in her lower extremities with PT has provided limited benefit.   Treatment with gabapentin daily and hydrocodone at night for pain if needed has provided moderate benefit. Aggravating factor is sitting for extended period of time and relieving factor is noted as movement throughout the day. She denies fever, chills, sweats, night sweats, loss of control of her bowel/bladdre.  No numbness or tingling present today.  She denies any recent falls which is an improvement from her past history.  Review of Systems  Constitutional: Negative for chills, fatigue and fever.  Respiratory: Negative for cough, shortness of breath and wheezing.   Cardiovascular: Negative for chest pain and palpitations.  Gastrointestinal: Negative for abdominal pain, constipation, diarrhea, nausea and vomiting.  Genitourinary: Negative for dysuria, hematuria and urgency.  Musculoskeletal: Positive for back pain.  Skin: Negative for rash.  Neurological: Negative for dizziness, facial asymmetry, weakness and light-headedness.   Past Medical History:  Diagnosis Date  . Arthritis    lower back shoulder  . Arthropathy,  unspecified, site unspecified   . Cancer ([place])    skin -spots on face removed  . Contact dermatitis and other eczema, due to unspecified cause    History  . GERD (gastroesophageal reflux disease)    history - no current prob - no med  . 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 Other bursitis disorders    history  . Personal history of colonic polyps   . PONV (postoperative nausea and vomiting)   . Pure hypercholesterolemia   . SVD (spontaneous vaginal delivery)    x 1  . Unspecified disorder of thyroid   . Unspecified hypothyroidism      Social History   Social History  . Marital status: Married    Spouse name: N/A  . Number of children: N/A  . Years of education: N/A   Occupational History  . Not on file.   Social History Main Topics  . Smoking status: Never Smoker  . Smokeless tobacco: Never Used  . Alcohol use Yes     Comment: rare  . Drug use: No  . Sexual activity: Yes    Birth control/ protection: Post-menopausal   Other Topics Concern  . Not on file   Social History Narrative  . No narrative on file    Past Surgical History:  Procedure Laterality Date  . BACK SURGERY  2002   L4/L5  . BLADDER SUSPENSION N/A 03/07/2014   Procedure: TRANSVAGINAL TAPE (TVT) PROCEDURE WITH CYSTO;  Surgeon: Princess Bruins, MD;  Location: [facility];  Service: Gynecology;  Laterality: N/A;  vaginal   . blephorplasty      bilateral   . BREAST SURGERY     x 2 breast implants, later removed  . COLONOSCOPY    . CYSTOCELE REPAIR N/A 03/07/2014   Procedure: ANTERIOR REPAIR (CYSTOCELE);  Surgeon: Princess Bruins, MD;  Location: [facility];  Service: Gynecology;  Laterality: N/A;  . devaited septum surgery    . EYE SURGERY     bilateral cataract surgery  . KNEE ARTHROSCOPY  10/21/2012   right - Procedure: ARTHROSCOPY KNEE;  Surgeon: Mcarthur Rossetti, MD;  Location: [facility];  Service: Orthopedics;  Laterality: Left;  Left Knee  Arthroscopy, Debridement, partial synovectomy  . LUMBAR LAMINECTOMY    . ROBOTIC ASSISTED LAPAROSCOPIC SACROCOLPOPEXY N/A 03/07/2014   Procedure: ROBOTIC ASSISTED LAPAROSCOPIC SACROCOLPOPEXY With Carrolyn Meiers;  Surgeon: Princess Bruins, MD;  Location: [facility];  Service: Gynecology;  Laterality: N/A;  . ROBOTIC ASSISTED SUPRACERVICAL HYSTERECTOMY WITH BILATERAL SALPINGO OOPHERECTOMY N/A 03/07/2014   Procedure: ROBOTIC ASSISTED SUPRACERVICAL HYSTERECTOMY WITH BILATERAL SALPINGO OOPHORECTOMY;  Surgeon: Princess Bruins, MD;  Location: [facility];  Service: Gynecology;  Laterality: N/A;  4 hrs.  Marland Kitchen TOTAL KNEE ARTHROPLASTY Left 12/30/2012   Procedure: TOTAL KNEE ARTHROPLASTY;  Surgeon: Mcarthur Rossetti, MD;  Location: [facility];  Service: Orthopedics;  Laterality: Left;  Left Total Knee Arthroplasty  . TUBAL LIGATION    . WISDOM TOOTH EXTRACTION      Family History  Problem Relation Age of Onset  . Cancer Mother     breast and lung  . Stroke Father   . Heart attack Father   . Diabetes Sister   . Emphysema Maternal Aunt   . Colon cancer Neg Hx     Allergies  Allergen Reactions  . Penicillins Anaphylaxis    REACTION: causes tongue to swell Has patient had a PCN reaction causing immediate rash, facial/tongue/throat swelling, SOB or lightheadedness with hypotension: Yes swelling Has patient had a PCN reaction causing severe rash involving mucus membranes or skin necrosis:  Has patient had a PCN reaction that required hospitalization no Has patient had a PCN reaction occurring within the last 10 years: no If all of the above answers are "NO", then may proceed with Cephalosporin use.   . Codeine Nausea Only  . Lisinopril     REACTION: wheezing  . Percocet [Oxycodone-Acetaminophen] Nausea And Vomiting    Current Outpatient Prescriptions on File Prior to Visit  Medication Sig Dispense Refill  . alendronate (FOSAMAX) 70 MG tablet Take 1 tablet (70 mg total) by mouth every 7 (seven) days. Take with a  full glass of water on an empty stomach. 4 tablet 11  . Cholecalciferol 5000 UNITS capsule Take 5,000 Units by mouth daily.    . Cyanocobalamin (VITAMIN B-12 PO) Take 5,000 mcg by mouth daily.    . diclofenac (VOLTAREN) 75 MG EC tablet     . FLUoxetine (PROZAC) 20 MG tablet Take 1 tablet (20 mg total) by mouth daily. 30 tablet 6  . gabapentin (NEURONTIN) 300 MG capsule Take 300 mg by mouth 3 (three) times daily.    Marland Kitchen KRILL OIL PO Take 1 tablet by mouth daily.     . meloxicam (MOBIC) 7.5 MG tablet Take 1 tablet (7.5 mg total) by mouth daily. 30 tablet 0  . omeprazole (PRILOSEC) 40 MG capsule take 1 capsule by mouth once daily 90 capsule 0  . thyroid (ARMOUR THYROID) 30 MG tablet Take 1 tablet (30 mg total) by mouth  daily. 90 tablet 1  . tiZANidine (ZANAFLEX) 4 MG tablet Take 4 mg by mouth every 6 (six) hours as needed for muscle spasms.    . valACYclovir (VALTREX) 1000 MG tablet     . VITAMIN E PO Take 1 tablet by mouth daily.    Marland Kitchen zolpidem (AMBIEN) 10 MG tablet   0   No current facility-administered medications on file prior to visit.     BP (!) 142/80 (BP Location: Left Arm, Patient Position: Sitting, Cuff Size: Normal)   Pulse 62   Temp 97.7 F (36.5 C) (Oral)   Wt 118 lb (53.5 kg)   SpO2 98%   BMI 23.83 kg/m        Objective:   Physical Exam  Constitutional: She is oriented to person, place, and time. She appears well-developed and well-nourished.  Eyes: Pupils are equal, round, and reactive to light. No scleral icterus.  Neck: Neck supple.  Cardiovascular: Normal rate and regular rhythm.   Pulmonary/Chest: Effort normal and breath sounds normal. She has no wheezes. She has no rales.  Abdominal: Soft. Bowel sounds are normal. There is no tenderness.  Musculoskeletal:  Mildly diminished reflexes and lower extremity weakness bilaterally which are not a new findings and remain unchanged from previous visit.  Lymphadenopathy:       Right: Inguinal adenopathy present.    Movable, mildly tender, mildy enlarged lymph node that has improved since previous visit.  Neurological: She is alert and oriented to person, place, and time.  Skin: Skin is warm and dry.       Assessment & Plan:  1. Sciatica of right side Symptoms are unchanged from previous exam. She is followed by orthopedics and has a scheduled appointment tomorrow for guided steroid injection.  Advised patient to take the short course of meloxicam if needed as she has this medication from her previous visit.  2. Inguinal lymphadenopathy Right sided lymphadenopathy has improved since previous visit. Exam and history are reassuring. If resolution does not continue to occur in the next 1 to 2 weeks; advised patient that imaging will be ordered.   Advised patient to establish care with a PCP and follow up if inguinal lymphadenopathy has not fully resolved as discussed previously. Patient voiced understanding and agreed with plan.  Delano Metz, FNP-C

## 2016-08-31 NOTE — Patient Instructions (Addendum)
Follow up with Dr. Luan Pulling as scheduled for your injection. Please use the short term dose of meloxicam as directed and follow up if discomfort does not improve or your notice any increase in size of the lymph node in your groin area.  Lymphadenopathy Lymphadenopathy refers to swollen or enlarged lymph glands, also called lymph nodes. Lymph glands are part of your body's defense (immune) system, which protects the body from infections, germs, and diseases. Lymph glands are found in many locations in your body, including the neck, underarm, and groin.  Many things can cause lymph glands to become enlarged. When your immune system responds to germs, such as viruses or bacteria, infection-fighting cells and fluid build up. This causes the glands to grow in size. Usually, this is not something to worry about. The swelling and any soreness often go away without treatment. However, swollen lymph glands can also be caused by a number of diseases. Your health care provider may do various tests to help determine the cause. If the cause of your swollen lymph glands cannot be found, it is important to monitor your condition to make sure the swelling goes away. HOME CARE INSTRUCTIONS Watch your condition for any changes. The following actions may help to lessen any discomfort you are feeling:  Get plenty of rest.  Take medicines only as directed by your health care provider. Your health care provider may recommend over-the-counter medicines for pain.  Apply moist heat compresses to the site of swollen lymph nodes as directed by your health care provider. This can help reduce any pain.  Check your lymph nodes daily for any changes.  Keep all follow-up visits as directed by your health care provider. This is important. SEEK MEDICAL CARE IF:  Your lymph nodes are still swollen after 2 weeks.  Your swelling increases or spreads to other areas.  Your lymph nodes are hard, seem fixed to the skin, or are growing  rapidly.  Your skin over the lymph nodes is red and inflamed.  You have a fever.  You have chills.  You have fatigue.  You develop a sore throat.  You have abdominal pain.  You have weight loss.  You have night sweats. SEEK IMMEDIATE MEDICAL CARE IF:  You notice fluid leaking from the area of the enlarged lymph node.  You have severe pain in any area of your body.  You have chest pain.  You have shortness of breath.   This information is not intended to replace advice given to you by your health care provider. Make sure you discuss any questions you have with your health care provider.   Document Released: 07/28/2008 Document Revised: 11/09/2014 Document Reviewed: 05/24/2014 Elsevier Interactive Patient Education [employer].

## 2016-09-01 DIAGNOSIS — M48062 Spinal stenosis, lumbar region with neurogenic claudication: Secondary | ICD-10-CM | POA: Diagnosis not present

## 2016-09-02 ENCOUNTER — Telehealth: Payer: Self-pay | Admitting: Family Medicine

## 2016-09-02 NOTE — Telephone Encounter (Signed)
Contacted patient regarding right inguinal tenderness. She stated that the tenderness has improved with the short course of meloxicam. We discussed the importance of follow up in 2 weeks to assess right inguinal lymphadenopathy that is resolving. If lymphadenopathy has not resolved within 2 weeks, an ultrasound will be scheduled for imaging. Also, we discussed that she will call back sooner than her 2 week appointment if she has increased tenderness or change in lymph node that does not continue to resolve or becomes larger, or new symptoms develop such as fever >100. Patient's prior weight loss noted in her record was intentional. Patient voiced understanding and agreed with plan.

## 2016-09-07 DIAGNOSIS — M810 Age-related osteoporosis without current pathological fracture: Secondary | ICD-10-CM | POA: Diagnosis not present

## 2016-09-08 DIAGNOSIS — M5416 Radiculopathy, lumbar region: Secondary | ICD-10-CM | POA: Diagnosis not present

## 2016-09-08 DIAGNOSIS — M48062 Spinal stenosis, lumbar region with neurogenic claudication: Secondary | ICD-10-CM | POA: Diagnosis not present

## 2016-09-08 DIAGNOSIS — M8000XA Age-related osteoporosis with current pathological fracture, unspecified site, initial encounter for fracture: Secondary | ICD-10-CM | POA: Diagnosis not present

## 2016-09-08 DIAGNOSIS — M4316 Spondylolisthesis, lumbar region: Secondary | ICD-10-CM | POA: Diagnosis not present

## 2016-10-05 DIAGNOSIS — M8000XA Age-related osteoporosis with current pathological fracture, unspecified site, initial encounter for fracture: Secondary | ICD-10-CM | POA: Diagnosis not present

## 2016-10-05 DIAGNOSIS — M48062 Spinal stenosis, lumbar region with neurogenic claudication: Secondary | ICD-10-CM | POA: Diagnosis not present

## 2016-10-05 DIAGNOSIS — M5416 Radiculopathy, lumbar region: Secondary | ICD-10-CM | POA: Diagnosis not present

## 2016-10-05 DIAGNOSIS — M4120 Other idiopathic scoliosis, site unspecified: Secondary | ICD-10-CM | POA: Diagnosis not present

## 2016-10-07 ENCOUNTER — Other Ambulatory Visit: Payer: Self-pay | Admitting: Family Medicine

## 2016-11-08 DIAGNOSIS — M25511 Pain in right shoulder: Secondary | ICD-10-CM | POA: Diagnosis not present

## 2016-11-08 DIAGNOSIS — S40011S Contusion of right shoulder, sequela: Secondary | ICD-10-CM | POA: Diagnosis not present

## 2016-11-08 DIAGNOSIS — Z79899 Other long term (current) drug therapy: Secondary | ICD-10-CM | POA: Diagnosis not present

## 2016-11-15 ENCOUNTER — Other Ambulatory Visit: Payer: Self-pay | Admitting: Family Medicine

## 2016-11-23 NOTE — Telephone Encounter (Signed)
RX prescribed previously by Dr. Elease Hashimoto.

## 2016-11-23 NOTE — Telephone Encounter (Signed)
Refill with 2 additional refills. 

## 2016-11-23 NOTE — Telephone Encounter (Signed)
Last seen by you 07-19-2016 Last refill 04-02-2016 #30, 2 rf Please advise on Ambien

## 2016-11-24 ENCOUNTER — Other Ambulatory Visit: Payer: Self-pay | Admitting: Neurosurgery

## 2016-11-24 DIAGNOSIS — M5417 Radiculopathy, lumbosacral region: Secondary | ICD-10-CM

## 2016-11-30 ENCOUNTER — Ambulatory Visit
Admission: RE | Admit: 2016-11-30 | Discharge: 2016-11-30 | Disposition: A | Discharge status: Home / Self Care | Payer: PPO | Source: Ambulatory Visit | Attending: Neurosurgery | Admitting: Neurosurgery

## 2016-11-30 DIAGNOSIS — M5417 Radiculopathy, lumbosacral region: Secondary | ICD-10-CM

## 2016-11-30 DIAGNOSIS — M545 Low back pain: Secondary | ICD-10-CM | POA: Diagnosis not present

## 2016-12-21 DIAGNOSIS — R030 Elevated blood-pressure reading, without diagnosis of hypertension: Secondary | ICD-10-CM | POA: Diagnosis not present

## 2016-12-21 DIAGNOSIS — M48062 Spinal stenosis, lumbar region with neurogenic claudication: Secondary | ICD-10-CM | POA: Diagnosis not present

## 2016-12-21 DIAGNOSIS — Z6825 Body mass index (BMI) 25.0-25.9, adult: Secondary | ICD-10-CM | POA: Diagnosis not present

## 2016-12-21 DIAGNOSIS — M4120 Other idiopathic scoliosis, site unspecified: Secondary | ICD-10-CM | POA: Diagnosis not present

## 2016-12-22 ENCOUNTER — Other Ambulatory Visit: Payer: Self-pay | Admitting: Neurosurgery

## 2016-12-25 DIAGNOSIS — R030 Elevated blood-pressure reading, without diagnosis of hypertension: Secondary | ICD-10-CM | POA: Diagnosis not present

## 2016-12-25 DIAGNOSIS — M48062 Spinal stenosis, lumbar region with neurogenic claudication: Secondary | ICD-10-CM | POA: Diagnosis not present

## 2016-12-25 DIAGNOSIS — Z6825 Body mass index (BMI) 25.0-25.9, adult: Secondary | ICD-10-CM | POA: Diagnosis not present

## 2016-12-25 DIAGNOSIS — M4120 Other idiopathic scoliosis, site unspecified: Secondary | ICD-10-CM | POA: Diagnosis not present

## 2017-01-06 ENCOUNTER — Other Ambulatory Visit: Payer: Self-pay | Admitting: Family Medicine

## 2017-01-06 DIAGNOSIS — M5431 Sciatica, right side: Secondary | ICD-10-CM

## 2017-01-11 NOTE — Pre-Procedure Instructions (Signed)
Julie [name]  01/11/2017      [id number] - [place], [place] - [address] [place] [place] [address] Phone: [phone] Fax: [phone]  [id number] [address], [place] [address] Phone: [phone] Fax: [phone]    Your procedure is scheduled on March 23  Report to [facility] at [place].M.  Call this number if you have problems the morning of surgery:  [phone]   Remember:  Do not eat food or drink liquids after midnight.   Take these medicines the morning of surgery with A SIP OF WATER ARMOUR THYROID,FLUoxetine (PROZAC),  gabapentin (NEURONTIN), HYDROcodone-acetaminophen (NORCO/VICODIN), omeprazole (PRILOSEC)  7 days prior to surgery STOP taking any meloxicam (MOBIC)  Aspirin, Aleve, Naproxen, Ibuprofen, Motrin, Advil, Goody's, BC's, all herbal medications, fish oil, and all vitamins    Do not wear jewelry, make-up or nail polish.  Do not wear lotions, powders, or perfumes, or deoderant.  Do not shave 48 hours prior to surgery.   Do not bring valuables to the hospital.  [facility] is not responsible for any belongings or valuables.  Contacts, dentures or bridgework may not be worn into surgery.  Leave your suitcase in the car.  After surgery it may be brought to your room.  For patients admitted to the hospital, discharge time will be determined by your treatment team.  Patients discharged the day of surgery will not be allowed to drive home.    Special instructions:   [facility]- Preparing For Surgery  Before surgery, you can play an important role. Because skin is not sterile, your skin needs to be as free of germs as possible. You can reduce the number of germs on your skin by washing with CHG (chlorahexidine gluconate) Soap before surgery.  CHG is an antiseptic cleaner which kills germs and bonds  with the skin to continue killing germs even after washing.  Please do not use if you have an allergy to CHG or antibacterial soaps. If your skin becomes reddened/irritated stop using the CHG.  Do not shave (including legs and underarms) for at least 48 hours prior to first CHG shower. It is OK to shave your face.  Please follow these instructions carefully.   1. Shower the NIGHT BEFORE SURGERY and the MORNING OF SURGERY with CHG.   2. If you chose to wash your hair, wash your hair first as usual with your normal shampoo.  3. After you shampoo, rinse your hair and body thoroughly to remove the shampoo.  4. Use CHG as you would any other liquid soap. You can apply CHG directly to the skin and wash gently with a scrungie or a clean washcloth.   5. Apply the CHG Soap to your body ONLY FROM THE NECK DOWN.  Do not use on open wounds or open sores. Avoid contact with your eyes, ears, mouth and genitals (private parts). Wash genitals (private parts) with your normal soap.  6. Wash thoroughly, paying special attention to the area where your surgery will be performed.  7. Thoroughly rinse your body with warm water from the neck down.  8. DO NOT shower/wash with your normal soap after using and rinsing off the CHG Soap.  9. Pat yourself dry with a CLEAN TOWEL.   10. Wear CLEAN PAJAMAS   11. Place CLEAN SHEETS on your bed the night of your first shower and DO NOT SLEEP WITH  PETS.    Day of Surgery: Do not apply any deodorants/lotions. Please wear clean clothes to the hospital/surgery center.      Please read over the following fact sheets that you were given.

## 2017-01-12 ENCOUNTER — Encounter (HOSPITAL_COMMUNITY): Payer: Self-pay

## 2017-01-12 ENCOUNTER — Ambulatory Visit (INDEPENDENT_AMBULATORY_CARE_PROVIDER_SITE_OTHER): Payer: PPO | Admitting: Adult Health

## 2017-01-12 ENCOUNTER — Encounter (HOSPITAL_COMMUNITY)
Admission: RE | Admit: 2017-01-12 | Discharge: 2017-01-12 | Disposition: A | Discharge status: Home / Self Care | Payer: PPO | Source: Ambulatory Visit | Attending: Neurosurgery | Admitting: Neurosurgery

## 2017-01-12 ENCOUNTER — Encounter: Payer: Self-pay | Admitting: Adult Health

## 2017-01-12 VITALS — BP 148/80 | Temp 98.3°F | Ht 59.0 in | Wt 127.5 lb

## 2017-01-12 DIAGNOSIS — Z981 Arthrodesis status: Secondary | ICD-10-CM | POA: Diagnosis not present

## 2017-01-12 DIAGNOSIS — M48062 Spinal stenosis, lumbar region with neurogenic claudication: Secondary | ICD-10-CM | POA: Diagnosis not present

## 2017-01-12 DIAGNOSIS — Z0183 Encounter for blood typing: Secondary | ICD-10-CM | POA: Diagnosis not present

## 2017-01-12 DIAGNOSIS — W5503XD Scratched by cat, subsequent encounter: Secondary | ICD-10-CM | POA: Diagnosis not present

## 2017-01-12 DIAGNOSIS — Z885 Allergy status to narcotic agent status: Secondary | ICD-10-CM | POA: Diagnosis not present

## 2017-01-12 DIAGNOSIS — I440 Atrioventricular block, first degree: Secondary | ICD-10-CM | POA: Insufficient documentation

## 2017-01-12 DIAGNOSIS — W5503XA Scratched by cat, initial encounter: Secondary | ICD-10-CM

## 2017-01-12 DIAGNOSIS — Z7983 Long term (current) use of bisphosphonates: Secondary | ICD-10-CM | POA: Diagnosis not present

## 2017-01-12 DIAGNOSIS — M5416 Radiculopathy, lumbar region: Secondary | ICD-10-CM | POA: Diagnosis not present

## 2017-01-12 DIAGNOSIS — I491 Atrial premature depolarization: Secondary | ICD-10-CM | POA: Insufficient documentation

## 2017-01-12 DIAGNOSIS — M4316 Spondylolisthesis, lumbar region: Secondary | ICD-10-CM | POA: Diagnosis not present

## 2017-01-12 DIAGNOSIS — Z01812 Encounter for preprocedural laboratory examination: Secondary | ICD-10-CM | POA: Diagnosis not present

## 2017-01-12 DIAGNOSIS — Z880 Allergy status to penicillin: Secondary | ICD-10-CM | POA: Diagnosis not present

## 2017-01-12 DIAGNOSIS — S50812D Abrasion of left forearm, subsequent encounter: Secondary | ICD-10-CM | POA: Diagnosis not present

## 2017-01-12 DIAGNOSIS — M4126 Other idiopathic scoliosis, lumbar region: Secondary | ICD-10-CM | POA: Diagnosis not present

## 2017-01-12 DIAGNOSIS — Z01818 Encounter for other preprocedural examination: Secondary | ICD-10-CM | POA: Diagnosis not present

## 2017-01-12 DIAGNOSIS — E039 Hypothyroidism, unspecified: Secondary | ICD-10-CM | POA: Diagnosis not present

## 2017-01-12 DIAGNOSIS — R030 Elevated blood-pressure reading, without diagnosis of hypertension: Secondary | ICD-10-CM | POA: Diagnosis not present

## 2017-01-12 DIAGNOSIS — M810 Age-related osteoporosis without current pathological fracture: Secondary | ICD-10-CM | POA: Diagnosis not present

## 2017-01-12 DIAGNOSIS — K219 Gastro-esophageal reflux disease without esophagitis: Secondary | ICD-10-CM | POA: Diagnosis not present

## 2017-01-12 HISTORY — DX: Essential (primary) hypertension: I10

## 2017-01-12 LAB — BASIC METABOLIC PANEL
ANION GAP: 10 (ref 5–15)
BUN: 12 mg/dL (ref 6–20)
CALCIUM: 9.6 mg/dL (ref 8.9–10.3)
CHLORIDE: 102 mmol/L (ref 101–111)
CO2: 28 mmol/L (ref 22–32)
CREATININE: 0.59 mg/dL (ref 0.44–1.00)
GFR calc non Af Amer: >60 mL/min (ref >60)
Glucose, Bld: 77 mg/dL (ref 65–99)
Potassium: 3.9 mmol/L (ref 3.5–5.1)
SODIUM: 140 mmol/L (ref 135–145)

## 2017-01-12 LAB — CBC
HCT: 38.2 % (ref 36.0–46.0)
HEMOGLOBIN: 12 g/dL (ref 12.0–15.0)
MCH: 29.1 pg (ref 26.0–34.0)
MCHC: 31.4 g/dL (ref 30.0–36.0)
MCV: 92.5 fL (ref 78.0–100.0)
PLATELETS: 252 10*3/uL (ref 150–400)
RBC: 4.13 MIL/uL (ref 3.87–5.11)
RDW: 13.3 % (ref 11.5–15.5)
WBC: 7.8 10*3/uL (ref 4.0–10.5)

## 2017-01-12 LAB — TYPE AND SCREEN
ABO/RH(D): A NEG
ANTIBODY SCREEN: NEGATIVE

## 2017-01-12 LAB — ABO/RH: ABO/RH(D): A NEG

## 2017-01-12 LAB — SURGICAL PCR SCREEN
MRSA, PCR: NEGATIVE
Staphylococcus aureus: NEGATIVE

## 2017-01-12 MED ORDER — DOXYCYCLINE HYCLATE 100 MG PO CAPS: 100.0000 mg | ORAL_CAPSULE | Freq: Two times a day (BID) | ORAL | 0 refills | Status: DC

## 2017-01-12 NOTE — Progress Notes (Signed)
Subjective:    Patient ID: Julie [name], female    DOB: April 27, 1941, 76 y.o.   MRN: [id number]  HPI  76 year old female who  has a past medical history of Arthritis; Arthropathy, unspecified, site unspecified; Cancer ([place]); Contact dermatitis and other eczema, due to unspecified cause; GERD (gastroesophageal reflux disease); Neuromuscular disorder ([place]); Other bursitis disorders; Personal history of colonic polyps; PONV (postoperative nausea and vomiting); Pure hypercholesterolemia; SVD (spontaneous vaginal delivery); Unspecified disorder of thyroid; and Unspecified hypothyroidism.   She presents to the office today s/p being scratched by her house cat two days ago. She reports that the car was climbing and she was scratched on her left forearm, causing a skin tear. She has been placing a bandage and triple antibiotic ointment on the wound.   She is UTD on tetanus   Denies any drainage     Review of Systems See HPI   Past Medical History:  Diagnosis Date  . Arthritis    lower back shoulder  . Arthropathy, unspecified, site unspecified   . Cancer ([place])    skin -spots on face removed  . Contact dermatitis and other eczema, due to unspecified cause    History  . GERD (gastroesophageal reflux disease)    history - no current prob - no med  . 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 Other bursitis disorders    history  . Personal history of colonic polyps   . PONV (postoperative nausea and vomiting)   . Pure hypercholesterolemia   . SVD (spontaneous vaginal delivery)    x 1  . Unspecified disorder of thyroid   . Unspecified hypothyroidism     Social History   Social History  . Marital status: Married    Spouse name: N/A  . Number of children: N/A  . Years of education: N/A   Occupational History  . Not on file.   Social History Main Topics  . Smoking status: Never Smoker  . Smokeless tobacco: Never Used  .  Alcohol use Yes     Comment: rare  . Drug use: No  . Sexual activity: Yes    Birth control/ protection: Post-menopausal   Other Topics Concern  . Not on file   Social History Narrative  . No narrative on file    Past Surgical History:  Procedure Laterality Date  . BACK SURGERY  2002   L4/L5  . BLADDER SUSPENSION N/A 03/07/2014   Procedure: TRANSVAGINAL TAPE (TVT) PROCEDURE WITH CYSTO;  Surgeon: Princess Bruins, MD;  Location: [facility];  Service: Gynecology;  Laterality: N/A;  vaginal   . blephorplasty      bilateral   . BREAST SURGERY     x 2 breast implants, later removed  . COLONOSCOPY    . CYSTOCELE REPAIR N/A 03/07/2014   Procedure: ANTERIOR REPAIR (CYSTOCELE);  Surgeon: Princess Bruins, MD;  Location: [facility];  Service: Gynecology;  Laterality: N/A;  . devaited septum surgery    . EYE SURGERY     bilateral cataract surgery  . KNEE ARTHROSCOPY  10/21/2012   right - Procedure: ARTHROSCOPY KNEE;  Surgeon: Mcarthur Rossetti, MD;  Location: [facility];  Service: Orthopedics;  Laterality: Left;  Left Knee Arthroscopy, Debridement, partial synovectomy  . LUMBAR LAMINECTOMY    . ROBOTIC ASSISTED LAPAROSCOPIC SACROCOLPOPEXY N/A 03/07/2014   Procedure: ROBOTIC ASSISTED LAPAROSCOPIC SACROCOLPOPEXY With Carrolyn Meiers;  Surgeon: Princess Bruins, MD;  Location: [facility];  Service: Gynecology;  Laterality: N/A;  . ROBOTIC ASSISTED SUPRACERVICAL HYSTERECTOMY WITH BILATERAL SALPINGO OOPHERECTOMY N/A 03/07/2014   Procedure: ROBOTIC ASSISTED SUPRACERVICAL HYSTERECTOMY WITH BILATERAL SALPINGO OOPHORECTOMY;  Surgeon: Princess Bruins, MD;  Location: [facility];  Service: Gynecology;  Laterality: N/A;  4 hrs.  Marland Kitchen TOTAL KNEE ARTHROPLASTY Left 12/30/2012   Procedure: TOTAL KNEE ARTHROPLASTY;  Surgeon: Mcarthur Rossetti, MD;  Location: [facility];  Service: Orthopedics;  Laterality: Left;  Left Total Knee Arthroplasty  . TUBAL LIGATION    . WISDOM TOOTH EXTRACTION      Family History  Problem Relation Age of  Onset  . Cancer Mother     breast and lung  . Stroke Father   . Heart attack Father   . Diabetes Sister   . Emphysema Maternal Aunt   . Colon cancer Neg Hx     Allergies  Allergen Reactions  . Penicillins Anaphylaxis    REACTION: causes tongue to swell Has patient had a PCN reaction causing immediate rash, facial/tongue/throat swelling, SOB or lightheadedness with hypotension: Yes swelling Has patient had a PCN reaction causing severe rash involving mucus membranes or skin necrosis:  Has patient had a PCN reaction that required hospitalization no Has patient had a PCN reaction occurring within the last 10 years: no If all of the above answers are "NO", then may proceed with Cephalosporin use.   . Adhesive [Tape] Other (See Comments)    Please use paper tape  . Codeine Nausea Only  . Lisinopril     REACTION: wheezing  . Other Other (See Comments)    Pt was having knee surgery and was given an epidural, which did not work.  [name] [Oxycodone-Acetaminophen] Nausea And Vomiting    Current Outpatient Prescriptions on File Prior to Visit  Medication Sig Dispense Refill  . alendronate (FOSAMAX) 70 MG tablet Take 1 tablet (70 mg total) by mouth every 7 (seven) days. Take with a full glass of water on an empty stomach. 4 tablet 11  . ARMOUR THYROID 30 MG tablet take 1 tablet by mouth once daily 90 tablet 1  . Bilberry, Vaccinium myrtillus, (BILBERRY PO) Take 1,200 mg elemental calcium/kg/hr by mouth daily.    . Biotin 3 MG TABS Take 1 tablet by mouth daily.    . Calcium Carb-Cholecalciferol (CALCIUM 500 + D3 PO) Take 1 tablet by mouth 2 (two) times daily.    . Cholecalciferol 5000 UNITS capsule Take 5,000 Units by mouth daily.    . Cinnamon 500 MG TABS Take 1 tablet by mouth daily.    . Cyanocobalamin (VITAMIN B-12 PO) Take 5,000 mcg by mouth daily.    Marland Kitchen FLUoxetine (PROZAC) 20 MG tablet take 1 tablet by mouth once daily 30 tablet 6  . gabapentin (NEURONTIN) 300 MG capsule Take 300  mg by mouth 3 (three) times daily.    Marland Kitchen HYDROcodone-acetaminophen (NORCO/VICODIN) 5-325 MG tablet Take 1 tablet by mouth every 4 (four) hours as needed for moderate pain.    Marland Kitchen lidocaine (LIDODERM) 5 % Place 1 patch onto the skin daily as needed. Remove & Discard patch within 12 hours or as directed by MD    . meloxicam (MOBIC) 7.5 MG tablet Take 1 tablet (7.5 mg total) by mouth daily. 30 tablet 0  . Multiple Vitamins-Minerals (AIRBORNE) CHEW Chew 1 tablet by mouth daily.    Marland Kitchen omeprazole (PRILOSEC) 40 MG capsule take 1 capsule by mouth once daily 90 capsule 0  . tiZANidine (ZANAFLEX) 4 MG tablet Take 4  mg by mouth every 6 (six) hours as needed for muscle spasms.    . TURMERIC CURCUMIN PO Take 1,000 mg by mouth daily.    . vitamin E 400 UNIT capsule Take 1 tablet by mouth daily.    Marland Kitchen zolpidem (AMBIEN) 10 MG tablet take 1 tablet by mouth at bedtime if needed for sleep 30 tablet 2   No current facility-administered medications on file prior to visit.     BP (!) 148/80 (BP Location: Right Arm, Patient Position: Sitting, Cuff Size: Normal)   Temp 98.3 F (36.8 C) (Oral)   Ht 4\' 11"  (1.499 m)   Wt 127 lb 8 oz (57.8 kg)   BMI 25.75 kg/m       Objective:   Physical Exam  Constitutional: She is oriented to person, place, and time. She appears well-developed and well-nourished. No distress.  Cardiovascular: Normal rate, regular rhythm, normal heart sounds and intact distal pulses.  Exam reveals no gallop and no friction rub.   No murmur heard. Pulmonary/Chest: Effort normal and breath sounds normal. No respiratory distress. She has no wheezes. She has no rales. She exhibits no tenderness.  Neurological: She is alert and oriented to person, place, and time.  Skin: Skin is warm and dry. No rash noted. She is not diaphoretic. No erythema. No pallor.  Small, dime sized skin tear on left forearm. Localized redness noted. No drainage.   Psychiatric: She has a normal mood and affect. Her behavior is  normal. Judgment and thought content normal.  Nursing note and vitals reviewed.     Assessment & Plan:  1. Cat scratch - Will cover for localized infection with doxycycline. Can continue antibiotic ointment and bandage until healed.  - doxycycline (VIBRAMYCIN) 100 MG capsule; Take 1 capsule (100 mg total) by mouth 2 (two) times daily.  Dispense: 14 capsule; Refill: 0 - Follow up if no improvement or if symptoms worsen    Dorothyann Peng, NP

## 2017-01-12 NOTE — Progress Notes (Addendum)
PCP - Carolann Littler Cardiologist - denies  Chest x-ray - not needed EKG - 01/12/17 Stress Test - denies ECHO - denies Cardiac Cath - denies  Patient has a cat scratch on her left wrist.  Saw primary MD and was put on an antibiotic. Patient was unaware of which antibiotic at this time.  Rn assessed cut.  Cut is red around the site looks bruised and had some old blood drainage on the bandage. Patient stated that MD it does not look infected but put on antibiotics just in case.  Spoke with [facility]    Patient denies shortness of breath, fever, cough and chest pain at PAT appointment   Patient verbalized understanding of instructions that was given to them at the PAT appointment. Patient expressed that there were no further questions.  Patient was also instructed that they will need to review over the PAT instructions again at home before the surgery.

## 2017-01-18 ENCOUNTER — Ambulatory Visit (INDEPENDENT_AMBULATORY_CARE_PROVIDER_SITE_OTHER): Payer: PPO | Admitting: Family Medicine

## 2017-01-18 ENCOUNTER — Encounter: Payer: Self-pay | Admitting: Family Medicine

## 2017-01-18 VITALS — BP 120/80 | HR 63 | Temp 98.5°F | Wt 125.9 lb

## 2017-01-18 DIAGNOSIS — R202 Paresthesia of skin: Secondary | ICD-10-CM | POA: Diagnosis not present

## 2017-01-18 DIAGNOSIS — E039 Hypothyroidism, unspecified: Secondary | ICD-10-CM

## 2017-01-18 DIAGNOSIS — W5503XD Scratched by cat, subsequent encounter: Secondary | ICD-10-CM | POA: Diagnosis not present

## 2017-01-18 DIAGNOSIS — S50812D Abrasion of left forearm, subsequent encounter: Secondary | ICD-10-CM | POA: Diagnosis not present

## 2017-01-18 NOTE — Progress Notes (Signed)
Subjective:     Patient ID: Julie [name], female   DOB: 01-10-1941, 76 y.o.   MRN: [id number]  HPI Patient seen for follow-up regarding several issues:  Recent cat scratch from her cat about a week ago. She was placed on doxycycline. She's not noted any progression in redness. No lymphadenopathy. No fevers or chills. There is no history of cat bite. She thinks her last tetanus was less than 10 years ago. Has been cleaning daily with soap and water. Finishes the doxycycline tomorrow.  She has scheduled lumbar back surgery for multilevel fusion this Friday. She's been instructed to hold meloxicam.  Frequent tingling sensation involving her right arm and right hand involving basically all digits over the past several weeks. Denies any neck pain. No weakness. Symptoms are somewhat intermittent but do not seem to be positional. No clear exacerbating or alleviating factors.  She has hypothyroidism on low-dose Armour thyroid.  Compliant with therapy.  Past Medical History:  Diagnosis Date  . Arthritis    lower back shoulder  . Arthropathy, unspecified, site unspecified   . Cancer ([place])    skin -spots on face removed  . Contact dermatitis and other eczema, due to unspecified cause    History  . GERD (gastroesophageal reflux disease)    history - no current prob - no med  . Hypertension    was taking lisinopril but had an allergic reaction to medicaion  . 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 Other bursitis disorders    history  . Personal history of colonic polyps   . PONV (postoperative nausea and vomiting)   . Pure hypercholesterolemia   . SVD (spontaneous vaginal delivery)    x 1  . Unspecified disorder of thyroid   . Unspecified hypothyroidism    Past Surgical History:  Procedure Laterality Date  . BACK SURGERY  2002   L4/L5  . BLADDER SUSPENSION N/A 03/07/2014   Procedure: TRANSVAGINAL TAPE (TVT) PROCEDURE WITH CYSTO;   Surgeon: Princess Bruins, MD;  Location: [facility];  Service: Gynecology;  Laterality: N/A;  vaginal   . blephorplasty      bilateral   . BREAST SURGERY     x 2 breast implants, later removed  . COLONOSCOPY    . CYSTOCELE REPAIR N/A 03/07/2014   Procedure: ANTERIOR REPAIR (CYSTOCELE);  Surgeon: Princess Bruins, MD;  Location: [facility];  Service: Gynecology;  Laterality: N/A;  . devaited septum surgery  1975  . EYE SURGERY     bilateral cataract surgery  . KNEE ARTHROSCOPY  10/21/2012   right - Procedure: ARTHROSCOPY KNEE;  Surgeon: Mcarthur Rossetti, MD;  Location: [facility];  Service: Orthopedics;  Laterality: Left;  Left Knee Arthroscopy, Debridement, partial synovectomy  . LUMBAR LAMINECTOMY    . ROBOTIC ASSISTED LAPAROSCOPIC SACROCOLPOPEXY N/A 03/07/2014   Procedure: ROBOTIC ASSISTED LAPAROSCOPIC SACROCOLPOPEXY With Carrolyn Meiers;  Surgeon: Princess Bruins, MD;  Location: [facility];  Service: Gynecology;  Laterality: N/A;  . ROBOTIC ASSISTED SUPRACERVICAL HYSTERECTOMY WITH BILATERAL SALPINGO OOPHERECTOMY N/A 03/07/2014   Procedure: ROBOTIC ASSISTED SUPRACERVICAL HYSTERECTOMY WITH BILATERAL SALPINGO OOPHORECTOMY;  Surgeon: Princess Bruins, MD;  Location: [facility];  Service: Gynecology;  Laterality: N/A;  4 hrs.  Marland Kitchen TOTAL KNEE ARTHROPLASTY Left 12/30/2012   Procedure: TOTAL KNEE ARTHROPLASTY;  Surgeon: Mcarthur Rossetti, MD;  Location: [facility];  Service: Orthopedics;  Laterality: Left;  Left Total Knee Arthroplasty  . TUBAL LIGATION    .  WISDOM TOOTH EXTRACTION      reports that she has never smoked. She has never used smokeless tobacco. She reports that she does not drink alcohol or use drugs. family history includes Cancer in her mother; Diabetes in her sister; Emphysema in her maternal aunt; Heart attack in her father; Stroke in her father. Allergies  Allergen Reactions  . Penicillins Anaphylaxis    REACTION: causes tongue to swell Has patient had a PCN reaction causing immediate rash,  facial/tongue/throat swelling, SOB or lightheadedness with hypotension: Yes swelling Has patient had a PCN reaction causing severe rash involving mucus membranes or skin necrosis:  Has patient had a PCN reaction that required hospitalization no Has patient had a PCN reaction occurring within the last 10 years: no If all of the above answers are "NO", then may proceed with Cephalosporin use.   . Adhesive [Tape] Other (See Comments)    Please use paper tape  . Codeine Nausea Only  . Lisinopril     REACTION: wheezing  . Other Other (See Comments)    Pt was having knee surgery and was given an epidural, which did not work.  [name] [Oxycodone-Acetaminophen] Nausea And Vomiting     Review of Systems  Constitutional: Negative for fatigue.  Eyes: Negative for visual disturbance.  Respiratory: Negative for cough, chest tightness, shortness of breath and wheezing.   Cardiovascular: Negative for chest pain, palpitations and leg swelling.  Neurological: Negative for dizziness, seizures, syncope, weakness, light-headedness and headaches.       Objective:   Physical Exam  Constitutional: She appears well-developed and well-nourished.  Eyes: Pupils are equal, round, and reactive to light.  Neck: Neck supple. No JVD present. No thyromegaly present.  Cardiovascular: Normal rate and regular rhythm.  Exam reveals no gallop.   Pulmonary/Chest: Effort normal and breath sounds normal. No respiratory distress. She has no wheezes. She has no rales.  Musculoskeletal: She exhibits no edema.  Neurological: She is alert.  Skin:  Left forearm flap-like laceration with minimal erythema. No warmth. No drainage. No necrosis.       Assessment:     #1 hypothyroidism  #2 recent cat scratch with flap-like laceration with patient on doxycycline with no signs of current secondary infection  #3 paresthesias involving her right upper extremity with nonfocal exam neurologically. She is describing  paresthesias involving entire hand and much of the arm-etiology unclear. She's not had any neck pain to suggest significant cervical radiculitis    Plan:     -She will return for TSH after she gets through her upcoming back surgery -Finish out doxycycline -Follow-up promptly for any progressive erythema, warmth, or drainage -follow up for any upper extremity weakness.  Eulas Post MD [facility]

## 2017-01-18 NOTE — Progress Notes (Signed)
Pre visit review using our clinic review tool, if applicable. No additional management support is needed unless otherwise documented below in the visit note. 

## 2017-01-18 NOTE — Patient Instructions (Signed)
Follow up for any upper extremity weakness or any progressive pain.

## 2017-01-22 ENCOUNTER — Encounter (HOSPITAL_COMMUNITY): Payer: Self-pay | Admitting: *Deleted

## 2017-01-22 ENCOUNTER — Encounter (HOSPITAL_COMMUNITY)
Admission: RE | Disposition: A | Discharge status: Home / Self Care | Payer: Self-pay | Source: Ambulatory Visit | Attending: Neurosurgery

## 2017-01-22 ENCOUNTER — Inpatient Hospital Stay (HOSPITAL_COMMUNITY): Payer: PPO | Admitting: Emergency Medicine

## 2017-01-22 ENCOUNTER — Inpatient Hospital Stay (HOSPITAL_COMMUNITY): Payer: PPO

## 2017-01-22 ENCOUNTER — Inpatient Hospital Stay (HOSPITAL_COMMUNITY): Payer: PPO | Admitting: Anesthesiology

## 2017-01-22 ENCOUNTER — Inpatient Hospital Stay (HOSPITAL_COMMUNITY)
Admission: RE | Admit: 2017-01-22 | Discharge: 2017-01-25 | DRG: 458 | Disposition: A | Discharge status: Home / Self Care | Payer: PPO | Source: Ambulatory Visit | Attending: Neurosurgery | Admitting: Neurosurgery

## 2017-01-22 DIAGNOSIS — E039 Hypothyroidism, unspecified: Secondary | ICD-10-CM | POA: Diagnosis not present

## 2017-01-22 DIAGNOSIS — Z981 Arthrodesis status: Secondary | ICD-10-CM

## 2017-01-22 DIAGNOSIS — Z7983 Long term (current) use of bisphosphonates: Secondary | ICD-10-CM

## 2017-01-22 DIAGNOSIS — E7800 Pure hypercholesterolemia, unspecified: Secondary | ICD-10-CM | POA: Diagnosis not present

## 2017-01-22 DIAGNOSIS — M5137 Other intervertebral disc degeneration, lumbosacral region: Secondary | ICD-10-CM | POA: Diagnosis not present

## 2017-01-22 DIAGNOSIS — Z9889 Other specified postprocedural states: Secondary | ICD-10-CM | POA: Diagnosis not present

## 2017-01-22 DIAGNOSIS — M419 Scoliosis, unspecified: Secondary | ICD-10-CM | POA: Diagnosis present

## 2017-01-22 DIAGNOSIS — K219 Gastro-esophageal reflux disease without esophagitis: Secondary | ICD-10-CM | POA: Diagnosis present

## 2017-01-22 DIAGNOSIS — M810 Age-related osteoporosis without current pathological fracture: Secondary | ICD-10-CM | POA: Diagnosis not present

## 2017-01-22 DIAGNOSIS — R269 Unspecified abnormalities of gait and mobility: Secondary | ICD-10-CM | POA: Diagnosis not present

## 2017-01-22 DIAGNOSIS — M4316 Spondylolisthesis, lumbar region: Secondary | ICD-10-CM | POA: Diagnosis not present

## 2017-01-22 DIAGNOSIS — M48062 Spinal stenosis, lumbar region with neurogenic claudication: Secondary | ICD-10-CM | POA: Diagnosis present

## 2017-01-22 DIAGNOSIS — Z880 Allergy status to penicillin: Secondary | ICD-10-CM | POA: Diagnosis not present

## 2017-01-22 DIAGNOSIS — M109 Gout, unspecified: Secondary | ICD-10-CM | POA: Diagnosis not present

## 2017-01-22 DIAGNOSIS — M5416 Radiculopathy, lumbar region: Secondary | ICD-10-CM | POA: Diagnosis not present

## 2017-01-22 DIAGNOSIS — Z419 Encounter for procedure for purposes other than remedying health state, unspecified: Secondary | ICD-10-CM

## 2017-01-22 DIAGNOSIS — Z888 Allergy status to other drugs, medicaments and biological substances status: Secondary | ICD-10-CM | POA: Diagnosis not present

## 2017-01-22 DIAGNOSIS — R030 Elevated blood-pressure reading, without diagnosis of hypertension: Secondary | ICD-10-CM | POA: Diagnosis not present

## 2017-01-22 DIAGNOSIS — Z885 Allergy status to narcotic agent status: Secondary | ICD-10-CM

## 2017-01-22 DIAGNOSIS — M4126 Other idiopathic scoliosis, lumbar region: Secondary | ICD-10-CM | POA: Diagnosis not present

## 2017-01-22 DIAGNOSIS — M5489 Other dorsalgia: Secondary | ICD-10-CM | POA: Diagnosis not present

## 2017-01-22 HISTORY — PX: ANTERIOR LAT LUMBAR FUSION: SHX1168

## 2017-01-22 HISTORY — PX: LUMBAR PERCUTANEOUS PEDICLE SCREW 2 LEVEL: SHX5561

## 2017-01-22 LAB — GLUCOSE, CAPILLARY: GLUCOSE-CAPILLARY: 97 mg/dL (ref 65–99)

## 2017-01-22 SURGERY — ANTERIOR LATERAL LUMBAR FUSION 2 LEVELS
Anesthesia: General | Site: Flank

## 2017-01-22 MED ORDER — ACETAMINOPHEN 10 MG/ML IV SOLN
INTRAVENOUS | Status: AC
  Filled 2017-01-22: qty 100

## 2017-01-22 MED ORDER — GABAPENTIN 300 MG PO CAPS
300.0000 mg | ORAL_CAPSULE | Freq: Three times a day (TID) | ORAL | Status: DC
  Administered 2017-01-22 – 2017-01-25 (×8): 300 mg via ORAL
  Filled 2017-01-22 (×8): qty 1

## 2017-01-22 MED ORDER — ACETAMINOPHEN 10 MG/ML IV SOLN
INTRAVENOUS | Status: DC | PRN
  Administered 2017-01-22: 1000 mg via INTRAVENOUS

## 2017-01-22 MED ORDER — SODIUM CHLORIDE 0.9 % IV SOLN: 250.0000 mL | INTRAVENOUS | Status: DC

## 2017-01-22 MED ORDER — KETOROLAC TROMETHAMINE 15 MG/ML IJ SOLN
7.5000 mg | Freq: Four times a day (QID) | INTRAMUSCULAR | Status: AC
  Administered 2017-01-22 – 2017-01-23 (×4): 7.5 mg via INTRAVENOUS
  Filled 2017-01-22 (×3): qty 1

## 2017-01-22 MED ORDER — ALENDRONATE SODIUM 70 MG PO TABS: 70.0000 mg | ORAL_TABLET | ORAL | Status: DC

## 2017-01-22 MED ORDER — DOCUSATE SODIUM 100 MG PO CAPS
100.0000 mg | ORAL_CAPSULE | Freq: Two times a day (BID) | ORAL | Status: DC
  Administered 2017-01-22 – 2017-01-25 (×6): 100 mg via ORAL
  Filled 2017-01-22 (×6): qty 1

## 2017-01-22 MED ORDER — BIOTIN 3 MG PO TABS: 1.0000 | ORAL_TABLET | Freq: Every day | ORAL | Status: DC

## 2017-01-22 MED ORDER — PHENYLEPHRINE HCL 10 MG/ML IJ SOLN
INTRAMUSCULAR | Status: DC | PRN
  Administered 2017-01-22: 45 ug/min via INTRAVENOUS

## 2017-01-22 MED ORDER — CHLORHEXIDINE GLUCONATE CLOTH 2 % EX PADS: 6.0000 | MEDICATED_PAD | Freq: Once | CUTANEOUS | Status: DC

## 2017-01-22 MED ORDER — VITAMIN D 1000 UNITS PO TABS
5000.0000 [IU] | ORAL_TABLET | Freq: Every day | ORAL | Status: DC
  Administered 2017-01-23 – 2017-01-25 (×3): 5000 [IU] via ORAL
  Filled 2017-01-22 (×4): qty 5

## 2017-01-22 MED ORDER — SUCCINYLCHOLINE CHLORIDE 20 MG/ML IJ SOLN
INTRAMUSCULAR | Status: DC | PRN
  Administered 2017-01-22: 60 mg via INTRAVENOUS

## 2017-01-22 MED ORDER — LACTATED RINGERS IV SOLN
INTRAVENOUS | Status: DC
  Administered 2017-01-22: 10:00:00 via INTRAVENOUS

## 2017-01-22 MED ORDER — PHENOL 1.4 % MT LIQD: 1.0000 | OROMUCOSAL | Status: DC | PRN

## 2017-01-22 MED ORDER — ONDANSETRON HCL 4 MG PO TABS: 4.0000 mg | ORAL_TABLET | Freq: Four times a day (QID) | ORAL | Status: DC | PRN

## 2017-01-22 MED ORDER — SODIUM CHLORIDE 0.9% FLUSH: 3.0000 mL | INTRAVENOUS | Status: DC | PRN

## 2017-01-22 MED ORDER — LIDOCAINE-EPINEPHRINE 2 %-1:100000 IJ SOLN
INTRAMUSCULAR | Status: DC | PRN
  Administered 2017-01-22 (×2): 5 mL

## 2017-01-22 MED ORDER — 0.9 % SODIUM CHLORIDE (POUR BTL) OPTIME
TOPICAL | Status: DC | PRN
  Administered 2017-01-22: 1000 mL

## 2017-01-22 MED ORDER — CINNAMON 500 MG PO TABS: 1.0000 | ORAL_TABLET | Freq: Every day | ORAL | Status: DC

## 2017-01-22 MED ORDER — MENTHOL 3 MG MT LOZG: 1.0000 | LOZENGE | OROMUCOSAL | Status: DC | PRN

## 2017-01-22 MED ORDER — BISACODYL 10 MG RE SUPP: 10.0000 mg | Freq: Every day | RECTAL | Status: DC | PRN

## 2017-01-22 MED ORDER — FLEET ENEMA 7-19 GM/118ML RE ENEM: 1.0000 | ENEMA | Freq: Once | RECTAL | Status: DC | PRN

## 2017-01-22 MED ORDER — POLYETHYLENE GLYCOL 3350 17 G PO PACK: 17.0000 g | PACK | Freq: Every day | ORAL | Status: DC | PRN

## 2017-01-22 MED ORDER — THROMBIN 5000 UNITS EX SOLR
CUTANEOUS | Status: AC
  Filled 2017-01-22: qty 15000

## 2017-01-22 MED ORDER — EPHEDRINE SULFATE 50 MG/ML IJ SOLN
INTRAMUSCULAR | Status: DC | PRN
  Administered 2017-01-22: 10 mg via INTRAVENOUS

## 2017-01-22 MED ORDER — BUPIVACAINE LIPOSOME 1.3 % IJ SUSP
20.0000 mL | INTRAMUSCULAR | Status: AC
  Administered 2017-01-22: 20 mL
  Filled 2017-01-22: qty 20

## 2017-01-22 MED ORDER — PROPOFOL 10 MG/ML IV BOLUS
INTRAVENOUS | Status: AC
  Filled 2017-01-22: qty 20

## 2017-01-22 MED ORDER — VANCOMYCIN HCL IN DEXTROSE 1-5 GM/200ML-% IV SOLN
1000.0000 mg | INTRAVENOUS | Status: AC
  Administered 2017-01-22: 1000 mg via INTRAVENOUS
  Filled 2017-01-22: qty 200

## 2017-01-22 MED ORDER — ZOLPIDEM TARTRATE 5 MG PO TABS
5.0000 mg | ORAL_TABLET | Freq: Every evening | ORAL | Status: DC | PRN
Start: 2017-01-22 — End: 2017-01-22

## 2017-01-22 MED ORDER — HYDROCODONE-ACETAMINOPHEN 5-325 MG PO TABS
1.0000 | ORAL_TABLET | ORAL | Status: DC | PRN
  Administered 2017-01-23 – 2017-01-24 (×5): 1 via ORAL
  Filled 2017-01-22 (×5): qty 1

## 2017-01-22 MED ORDER — DEXTROSE 5 % IV SOLN
INTRAVENOUS | Status: DC | PRN
  Administered 2017-01-22: 13:00:00 via INTRAVENOUS

## 2017-01-22 MED ORDER — VANCOMYCIN HCL IN DEXTROSE 1-5 GM/200ML-% IV SOLN
1000.0000 mg | Freq: Once | INTRAVENOUS | Status: AC
  Administered 2017-01-23: 1000 mg via INTRAVENOUS
  Filled 2017-01-22: qty 200

## 2017-01-22 MED ORDER — GLYCOPYRROLATE 0.2 MG/ML IJ SOLN
INTRAMUSCULAR | Status: DC | PRN
  Administered 2017-01-22: 0.2 mg via INTRAVENOUS

## 2017-01-22 MED ORDER — DOXYCYCLINE HYCLATE 100 MG PO CAPS: 100.0000 mg | ORAL_CAPSULE | Freq: Two times a day (BID) | ORAL | Status: DC

## 2017-01-22 MED ORDER — ONDANSETRON HCL 4 MG/2ML IJ SOLN
INTRAMUSCULAR | Status: AC
  Filled 2017-01-22: qty 6

## 2017-01-22 MED ORDER — KETOROLAC TROMETHAMINE 15 MG/ML IJ SOLN
INTRAMUSCULAR | Status: AC
  Filled 2017-01-22: qty 1

## 2017-01-22 MED ORDER — PROPOFOL 500 MG/50ML IV EMUL
INTRAVENOUS | Status: DC | PRN
  Administered 2017-01-22: 50 ug/kg/min via INTRAVENOUS

## 2017-01-22 MED ORDER — ADULT MULTIVITAMIN W/MINERALS CH
1.0000 | ORAL_TABLET | Freq: Every day | ORAL | Status: DC
  Administered 2017-01-23 – 2017-01-25 (×3): 1 via ORAL
  Filled 2017-01-22 (×3): qty 1

## 2017-01-22 MED ORDER — LACTATED RINGERS IV SOLN
INTRAVENOUS | Status: DC | PRN
  Administered 2017-01-22: 12:00:00 via INTRAVENOUS

## 2017-01-22 MED ORDER — ONDANSETRON HCL 4 MG/2ML IJ SOLN: 4.0000 mg | Freq: Four times a day (QID) | INTRAMUSCULAR | Status: DC | PRN

## 2017-01-22 MED ORDER — MORPHINE SULFATE (PF) 2 MG/ML IV SOLN: 1.0000 mg | INTRAVENOUS | Status: DC | PRN

## 2017-01-22 MED ORDER — FENTANYL CITRATE (PF) 100 MCG/2ML IJ SOLN
INTRAMUSCULAR | Status: AC
  Filled 2017-01-22: qty 4

## 2017-01-22 MED ORDER — PANTOPRAZOLE SODIUM 40 MG PO TBEC: 40.0000 mg | DELAYED_RELEASE_TABLET | Freq: Every day | ORAL | Status: DC

## 2017-01-22 MED ORDER — FENTANYL CITRATE (PF) 100 MCG/2ML IJ SOLN
INTRAMUSCULAR | Status: AC
  Administered 2017-01-22: 25 ug via INTRAVENOUS
  Filled 2017-01-22: qty 2

## 2017-01-22 MED ORDER — BUPIVACAINE HCL (PF) 0.5 % IJ SOLN
INTRAMUSCULAR | Status: DC | PRN
  Administered 2017-01-22 (×2): 5 mL

## 2017-01-22 MED ORDER — ONDANSETRON HCL 4 MG/2ML IJ SOLN: 4.0000 mg | Freq: Once | INTRAMUSCULAR | Status: DC | PRN

## 2017-01-22 MED ORDER — FENTANYL CITRATE (PF) 100 MCG/2ML IJ SOLN
25.0000 ug | INTRAMUSCULAR | Status: DC | PRN
  Administered 2017-01-22 (×2): 25 ug via INTRAVENOUS

## 2017-01-22 MED ORDER — LIDOCAINE 2% (20 MG/ML) 5 ML SYRINGE
INTRAMUSCULAR | Status: AC
  Filled 2017-01-22: qty 10

## 2017-01-22 MED ORDER — SODIUM CHLORIDE 0.9% FLUSH
3.0000 mL | Freq: Two times a day (BID) | INTRAVENOUS | Status: DC
  Administered 2017-01-22 – 2017-01-25 (×6): 3 mL via INTRAVENOUS

## 2017-01-22 MED ORDER — VITAMIN B-12 1000 MCG PO TABS
5000.0000 ug | ORAL_TABLET | Freq: Every day | ORAL | Status: DC
  Administered 2017-01-23 – 2017-01-25 (×3): 5000 ug via ORAL
  Filled 2017-01-22 (×3): qty 5

## 2017-01-22 MED ORDER — FENTANYL CITRATE (PF) 100 MCG/2ML IJ SOLN
INTRAMUSCULAR | Status: DC | PRN
  Administered 2017-01-22 (×4): 50 ug via INTRAVENOUS

## 2017-01-22 MED ORDER — ONDANSETRON HCL 4 MG/2ML IJ SOLN
INTRAMUSCULAR | Status: DC | PRN
  Administered 2017-01-22 (×2): 4 mg via INTRAVENOUS

## 2017-01-22 MED ORDER — TURMERIC CURCUMIN PO CAPS: ORAL_CAPSULE | Freq: Every day | ORAL | Status: DC

## 2017-01-22 MED ORDER — METHOCARBAMOL 1000 MG/10ML IJ SOLN
500.0000 mg | Freq: Four times a day (QID) | INTRAVENOUS | Status: DC | PRN
  Filled 2017-01-22: qty 5

## 2017-01-22 MED ORDER — METHOCARBAMOL 500 MG PO TABS
500.0000 mg | ORAL_TABLET | Freq: Four times a day (QID) | ORAL | Status: DC | PRN
  Administered 2017-01-22 – 2017-01-25 (×2): 500 mg via ORAL
  Filled 2017-01-22 (×2): qty 1

## 2017-01-22 MED ORDER — TRAMADOL HCL 50 MG PO TABS
50.0000 mg | ORAL_TABLET | Freq: Four times a day (QID) | ORAL | Status: DC | PRN
  Administered 2017-01-24 – 2017-01-25 (×2): 50 mg via ORAL
  Filled 2017-01-22 (×2): qty 1

## 2017-01-22 MED ORDER — LIDOCAINE HCL (CARDIAC) 20 MG/ML IV SOLN
INTRAVENOUS | Status: DC | PRN
  Administered 2017-01-22: 60 mg via INTRAVENOUS

## 2017-01-22 MED ORDER — THYROID 30 MG PO TABS
30.0000 mg | ORAL_TABLET | Freq: Every day | ORAL | Status: DC
  Administered 2017-01-23 – 2017-01-25 (×3): 30 mg via ORAL
  Filled 2017-01-22 (×3): qty 1

## 2017-01-22 MED ORDER — HYDROCODONE-ACETAMINOPHEN 5-325 MG PO TABS
1.0000 | ORAL_TABLET | ORAL | Status: DC | PRN
  Administered 2017-01-22: 2 via ORAL
  Filled 2017-01-22: qty 2

## 2017-01-22 MED ORDER — PANTOPRAZOLE SODIUM 40 MG IV SOLR
40.0000 mg | Freq: Every day | INTRAVENOUS | Status: DC
  Administered 2017-01-22: 40 mg via INTRAVENOUS
  Filled 2017-01-22: qty 40

## 2017-01-22 MED ORDER — EPHEDRINE 5 MG/ML INJ
INTRAVENOUS | Status: AC
  Filled 2017-01-22: qty 10

## 2017-01-22 MED ORDER — PROPOFOL 10 MG/ML IV BOLUS
INTRAVENOUS | Status: DC | PRN
  Administered 2017-01-22: 80 mg via INTRAVENOUS
  Administered 2017-01-22: 50 mg via INTRAVENOUS
  Administered 2017-01-22: 70 mg via INTRAVENOUS

## 2017-01-22 MED ORDER — PHENYLEPHRINE 40 MCG/ML (10ML) SYRINGE FOR IV PUSH (FOR BLOOD PRESSURE SUPPORT)
PREFILLED_SYRINGE | INTRAVENOUS | Status: AC
  Filled 2017-01-22: qty 20

## 2017-01-22 MED ORDER — LIDOCAINE-EPINEPHRINE 2 %-1:100000 IJ SOLN
INTRAMUSCULAR | Status: AC
  Filled 2017-01-22: qty 1

## 2017-01-22 MED ORDER — ZOLPIDEM TARTRATE 5 MG PO TABS
5.0000 mg | ORAL_TABLET | Freq: Every evening | ORAL | Status: DC | PRN
  Administered 2017-01-23 – 2017-01-24 (×2): 5 mg via ORAL
  Filled 2017-01-22 (×2): qty 1

## 2017-01-22 MED ORDER — THROMBIN 5000 UNITS EX SOLR
OROMUCOSAL | Status: DC | PRN
  Administered 2017-01-22: 15:00:00 via TOPICAL

## 2017-01-22 MED ORDER — MIDAZOLAM HCL 2 MG/2ML IJ SOLN
INTRAMUSCULAR | Status: AC
  Filled 2017-01-22: qty 2

## 2017-01-22 MED ORDER — LIDOCAINE 5 % EX PTCH: 1.0000 | MEDICATED_PATCH | Freq: Every day | CUTANEOUS | Status: DC | PRN

## 2017-01-22 MED ORDER — KCL IN DEXTROSE-NACL 20-5-0.45 MEQ/L-%-% IV SOLN
INTRAVENOUS | Status: DC
  Filled 2017-01-22: qty 1000

## 2017-01-22 MED ORDER — FLUOXETINE HCL 20 MG PO CAPS
20.0000 mg | ORAL_CAPSULE | Freq: Every day | ORAL | Status: DC
  Administered 2017-01-23 – 2017-01-25 (×3): 20 mg via ORAL
  Filled 2017-01-22 (×4): qty 1

## 2017-01-22 SURGICAL SUPPLY — 75 items
BLADE CLIPPER SURG (BLADE) IMPLANT
CAGE MODULUS XLW 8X22X55 - 10 (Cage) ×5 IMPLANT
CARTRIDGE OIL MAESTRO DRILL (MISCELLANEOUS) IMPLANT
CLIP NEUROVISION LG (CLIP) ×6 IMPLANT
CONT SPEC 4OZ CLIKSEAL STRL BL (MISCELLANEOUS) ×6 IMPLANT
COVER BACK TABLE 24X17X13 BIG (DRAPES) IMPLANT
COVER BACK TABLE 60X90IN (DRAPES) ×6 IMPLANT
DECANTER SPIKE VIAL GLASS SM (MISCELLANEOUS) ×6 IMPLANT
DERMABOND ADVANCED (GAUZE/BANDAGES/DRESSINGS) ×4
DERMABOND ADVANCED .7 DNX12 (GAUZE/BANDAGES/DRESSINGS) ×8 IMPLANT
DIFFUSER DRILL AIR PNEUMATIC (MISCELLANEOUS) IMPLANT
DRAPE C-ARM 42X72 X-RAY (DRAPES) ×12 IMPLANT
DRAPE C-ARMOR (DRAPES) ×12 IMPLANT
DRAPE LAPAROTOMY 100X72X124 (DRAPES) ×12 IMPLANT
DRAPE POUCH INSTRU U-SHP 10X18 (DRAPES) ×12 IMPLANT
DRAPE SURG 17X23 STRL (DRAPES) ×6 IMPLANT
DRSG OPSITE POSTOP 3X4 (GAUZE/BANDAGES/DRESSINGS) ×12 IMPLANT
DRSG OPSITE POSTOP 4X6 (GAUZE/BANDAGES/DRESSINGS) ×18 IMPLANT
DURAPREP 26ML APPLICATOR (WOUND CARE) ×12 IMPLANT
ELECT REM PT RETURN 9FT ADLT (ELECTROSURGICAL) ×12
ELECTRODE REM PT RTRN 9FT ADLT (ELECTROSURGICAL) ×8 IMPLANT
GAUZE SPONGE 4X4 12PLY STRL (GAUZE/BANDAGES/DRESSINGS) IMPLANT
GAUZE SPONGE 4X4 16PLY XRAY LF (GAUZE/BANDAGES/DRESSINGS) ×6 IMPLANT
GLOVE BIO SURGEON STRL SZ8 (GLOVE) ×18 IMPLANT
GLOVE BIOGEL PI IND STRL 8 (GLOVE) ×8 IMPLANT
GLOVE BIOGEL PI IND STRL 8.5 (GLOVE) ×8 IMPLANT
GLOVE BIOGEL PI INDICATOR 8 (GLOVE) ×4
GLOVE BIOGEL PI INDICATOR 8.5 (GLOVE) ×4
GLOVE ECLIPSE 8.0 STRL XLNG CF (GLOVE) ×12 IMPLANT
GLOVE EXAM NITRILE LRG STRL (GLOVE) IMPLANT
GLOVE EXAM NITRILE XL STR (GLOVE) IMPLANT
GLOVE EXAM NITRILE XS STR PU (GLOVE) IMPLANT
GOWN STRL REUS W/ TWL LRG LVL3 (GOWN DISPOSABLE) ×4 IMPLANT
GOWN STRL REUS W/ TWL XL LVL3 (GOWN DISPOSABLE) ×12 IMPLANT
GOWN STRL REUS W/TWL 2XL LVL3 (GOWN DISPOSABLE) ×18 IMPLANT
GOWN STRL REUS W/TWL LRG LVL3 (GOWN DISPOSABLE) ×2
GOWN STRL REUS W/TWL XL LVL3 (GOWN DISPOSABLE) ×6
GUIDEWIRE NITINOL BEVEL TIP (WIRE) ×36 IMPLANT
HEMOSTAT POWDER KIT SURGIFOAM (HEMOSTASIS) ×6 IMPLANT
KIT BASIN OR (CUSTOM PROCEDURE TRAY) ×6 IMPLANT
KIT DILATOR XLIF 5 (KITS) ×4 IMPLANT
KIT INFUSE XX SMALL 0.7CC (Orthopedic Implant) ×6 IMPLANT
KIT POSITION SURG JACKSON T1 (MISCELLANEOUS) ×6 IMPLANT
KIT ROOM TURNOVER OR (KITS) ×6 IMPLANT
KIT SURGICAL ACCESS MAXCESS 4 (KITS) ×6 IMPLANT
KIT XLIF (KITS) ×2
MARKER SKIN DUAL TIP RULER LAB (MISCELLANEOUS) ×12 IMPLANT
MODULE NVM5 NEXT GEN EMG (NEEDLE) ×6 IMPLANT
MODULUS XLW 12X22X55MM 10 (Spine Construct) ×5 IMPLANT
NEEDLE HYPO 25X1 1.5 SAFETY (NEEDLE) ×6 IMPLANT
NEEDLE I PASS (NEEDLE) ×6 IMPLANT
NS IRRIG 1000ML POUR BTL (IV SOLUTION) ×6 IMPLANT
OIL CARTRIDGE MAESTRO DRILL (MISCELLANEOUS)
PACK LAMINECTOMY NEURO (CUSTOM PROCEDURE TRAY) ×12 IMPLANT
PAD ARMBOARD 7.5X6 YLW CONV (MISCELLANEOUS) ×30 IMPLANT
PATTIES SURGICAL .5 X.5 (GAUZE/BANDAGES/DRESSINGS) IMPLANT
PATTIES SURGICAL .5 X1 (DISPOSABLE) IMPLANT
PATTIES SURGICAL 1X1 (DISPOSABLE) IMPLANT
PUTTY BONE ATTRAX 10CC STRIP (Putty) ×6 IMPLANT
ROD RELINE MAS 5.5X55MM LORD (Rod) ×6 IMPLANT
ROD RELINE TI MAS LD 5.5X65 (Rod) ×6 IMPLANT
SCREW LOCK RELINE 5.5 TULIP (Screw) ×42 IMPLANT
SCREW MAS RELINE 6.5X45 POLY (Screw) ×18 IMPLANT
SCREW MAS RELINE POLY 6.5X40 (Screw) ×18 IMPLANT
SPONGE LAP 4X18 X RAY DECT (DISPOSABLE) IMPLANT
STAPLER SKIN PROX WIDE 3.9 (STAPLE) ×6 IMPLANT
SUT VIC AB 1 CT1 18XBRD ANBCTR (SUTURE) ×12 IMPLANT
SUT VIC AB 1 CT1 8-18 (SUTURE) ×6
SUT VIC AB 2-0 CT1 18 (SUTURE) ×18 IMPLANT
SUT VIC AB 3-0 SH 8-18 (SUTURE) ×24 IMPLANT
SYR INSULIN 1ML 31GX6 SAFETY (SYRINGE) IMPLANT
TOWEL GREEN STERILE (TOWEL DISPOSABLE) ×12 IMPLANT
TOWEL GREEN STERILE FF (TOWEL DISPOSABLE) ×6 IMPLANT
TRAY FOLEY W/METER SILVER 16FR (SET/KITS/TRAYS/PACK) ×6 IMPLANT
WATER STERILE IRR 1000ML POUR (IV SOLUTION) ×6 IMPLANT

## 2017-01-22 NOTE — Interval H&P Note (Signed)
History and Physical Interval Note:  01/22/2017 12:31 PM  Julie [name]  has presented today for surgery, with the diagnosis of Lumbar Stenosis with neurogenic claudication  The various methods of treatment have been discussed with the patient and family. After consideration of risks, benefits and other options for treatment, the patient has consented to  Procedure(s) with comments: Left L3-4 L4-5 possibly L2-3 Anterior lateral lumbar interbody fusion with percutaneous pedicle screws (Left) - Left L3-4 L4-5 possibly L2-3 Anterior lateral lumbar interbody fusion with percutaneous pedicle screws LUMBAR PERCUTANEOUS PEDICLE SCREW 3 LEVEL (N/A) as a surgical intervention .  The patient's history has been reviewed, patient examined, no change in status, stable for surgery.  I have reviewed the patient's chart and labs.  Questions were answered to the patient's satisfaction.     [name]

## 2017-01-22 NOTE — H&P (Signed)
Patient ID:   [id number] Patient: Julie [name]  Date of Birth: October 03, 1941 Visit Type: Office Visit   Date: 12/21/2016 12:30 PM Provider: Marchia Meiers. Vertell Limber MD   This 76 year old female presents for back pain.  History of Present Illness: 1.  back pain    11/30/2016 right L3-4, right L4-5 transforaminal ESI  09/07/16 bone density scan: Based on the WHO criteria this patient has osteoporosis. She is osteoporotic in her wrist and osteopenic in her femurs.  Patient returns to review her CT scan.  She notes only 2 weeks relief following epidural injection by Dr. Maryjean Ka.  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  Moderate central canal stenosis moderatly severe left foraminal narrowing at this level are also unchanged.  No change in moderately severe to severe right subarticular recess and foraminal narrowing at L4-5 due to bulging disc and facet degenerative disease.  Status post L5-S1 fusion.  The central canal and foramina appear open.  Convex right scoliosis.  Atherosclerosis.      Medical/Surgical/Interim History Reviewed, no change.   Family History: Reviewed, no changes.    Social History: Tobacco use reviewed. Reviewed, no changes.     MEDICATIONS(added, continued or stopped this visit): Started Medication Directions Instruction Stopped   Armour Thyroid 30 mg tablet      fluoxetine 20 mg capsule take 1 capsule by oral route  every day in the morning     Fosamax 70 mg tablet take 1 tablet by oral route  every week in the morning, at least 30 min before first food, beverage, or medication of day    11/17/2016 gabapentin 300 mg capsule take 1 capsule by oral route 3 times every day    08/20/2016 hydrocodone 5 mg-acetaminophen 325 mg tablet take 1 tablet by oral route every 8 hours as needed for pain     meloxicam 7.5 mg tablet take 1 tablet by mouth  route  every 12 hours     omeprazole 40 mg capsule,delayed release take 1 capsule by oral route 2 times every day before a meal     tizanidine 4 mg capsule take 1 capsule by oral route  every 8 hours PRN spasm     Vitamin B-12 1,000 mcg/mL oral drops      Vitamin D3 5,000 unit tablet        ALLERGIES: Ingredient Reaction Medication Name Comment  CODEINE Unknown    PENICILLINS Unknown    LISINOPRIL     OXYCODONE         Vitals Date Temp F BP Pulse Ht In Wt Lb BMI BSA Pain Score  12/21/2016  153/76 65 59 125 25.25  5/10      IMPRESSION The patient returns to review her CT scan, noting her low back and right leg pain is worsening.  She has severe foraminal stenosis and subarticular recesses at L3-4, L4-5 and well-healed L5-S1 fusion.  Her severe lumbar scoliosis at L3-4, L4-5 is also evident.  She does have osteoporosis so I am concerned about surgery but she is desperate for relief.  We discussed in depth the complications of surgical intervention.  Scheduled left L3-4, L4-5, and possible L2-3 XLIF with percutaneous pedicle screws.  Completed Orders (this encounter) Order Details Reason Side Interpretation Result Initial Treatment Date Region  Dietary management education, guidance, and counseling Encouraged to eat a well balanced diet and follow up  with primary care physician.        Hypertension education Continue to monitor blood pressure.  If blood pressure remains elevated, contact your primary care physician         Assessment/Plan # Detail Type Description   1. Assessment Body mass index (BMI) 25.0-25.9, adult (B09.62).   Plan Orders Today's instructions / counseling include(s) Dietary management education, guidance, and counseling.       2. Assessment Elevated blood-pressure reading, w/o diagnosis of htn (R03.0).         Pain Assessment/Treatment Pain Scale: 5/10. Method: Numeric Pain Intensity Scale. Location: back. Onset: 04/05/2016. Duration: varies. Quality:  discomforting. Pain Assessment/Treatment follow-up plan of care: Patient taking medications as prescribed..  Scheduled left L3-4, L4-5, and possible L2-3 XLIF with percutaneous pedicle screws.  Fit for LSO brace. Nurse education given.  Orders: Instruction(s)/Education: Assessment Instruction  R03.0 Hypertension education  Z68.25 Dietary management education, guidance, and counseling             Provider:  Marchia Meiers. Vertell Limber MD  12/21/2016 01:59 PM Dictation edited by: Daine Gravel    CC Providers: Willey Blade [facility] [address] [place], [place] [address]-              Electronically signed by Marchia Meiers. Vertell Limber MD on 12/22/2016 05:17 PM  Patient ID:   [id number] Patient: Julie [name]  Date of Birth: Aug 30, 1941 Visit Type: Office Visit   Date: 10/05/2016 12:30 PM Provider: Marchia Meiers. Vertell Limber MD   This 76 year old female presents for back pain.  History of Present Illness: 1.  back pain    The patient comes in to review her bone density results. She continues to have lumbar and lateral right leg pain that is not getting better. Her pain is 7/10 today and was 10/10 on Saturday.   09/07/16 bone density scan: Based on the WHO criteria this patient has osteoporosis. She is osteoporotic in her wrist and osteopenic in her femurs.      Medical/Surgical/Interim History Reviewed, no change.   Family History: Reviewed, no changes.    Social History: Tobacco use reviewed. Reviewed, no changes.     MEDICATIONS(added, continued or stopped this visit): Started Medication Directions Instruction Stopped   Armour Thyroid 30 mg tablet      fluoxetine 20 mg capsule take 1 capsule by oral route  every day in the morning     Fosamax 70 mg tablet take 1 tablet by oral route  every week in the morning, at least 30 min before first food, beverage, or medication of day    09/08/2016 gabapentin 300 mg capsule take 1 capsule by oral  route 2 times every day    08/20/2016 hydrocodone 5 mg-acetaminophen 325 mg tablet take 1 tablet by oral route every 8 hours as needed for pain     meloxicam 7.5 mg tablet take 1 tablet by mouth route  every 12 hours     omeprazole 40 mg capsule,delayed release take 1 capsule by oral route 2 times every day before a meal     tizanidine 4 mg capsule take 1 capsule by oral route  every 8 hours PRN spasm     Vitamin B-12 1,000 mcg/mL oral drops      Vitamin D3 5,000 unit tablet        ALLERGIES: Ingredient Reaction Medication Name Comment  CODEINE Unknown    PENICILLINS Unknown    LISINOPRIL     OXYCODONE  Reviewed, no changes.    Vitals Date Temp F BP Pulse Ht In Wt Lb BMI BSA Pain Score  10/05/2016  138/77 76 59 117 23.63  7/10      IMPRESSION The patient's bone density scan reveals that she is osteoporotic in her wrist and osteopenic in her femurs. It has not gotten significantly worse since her prior scan, but it is still a risk to put her through a major back surgery. She has a significant lumbar scoliosis as well as stenosis at L3-4 and L4-5. I will need to review her studies in more detail to see if we can do anything surgically to alleviate her severe low back and right leg pain. She got a month of relief from her last injection with Dr. Maryjean Ka and that may be a viable option. I will give her a call within the week to discuss the possibilites.   Completed Orders (this encounter) Order Details Reason Side Interpretation Result Initial Treatment Date Region  Lifestyle education Patient will follow up with Primary care physician.         Assessment/Plan # Detail Type Description   1. Assessment Essential (primary) hypertension (I10).         Pain Assessment/Treatment Pain Scale: 7/10. Method: Numeric Pain Intensity Scale. Location: back. Duration: varies. Quality: discomforting. Pain Assessment/Treatment follow-up plan of care: Patient will continue medication  management..  I will give her a call once I have reviewed her studies in more detail.  Orders: Instruction(s)/Education: Assessment Instruction  I10 Lifestyle education             Provider:  Marchia Meiers. Vertell Limber MD  10/05/2016 12:56 PM Dictation edited by: Johnella Moloney    CC Providers: Willey Blade [facility] [address] [place], [place] [address]-              Electronically signed by Marchia Meiers. Vertell Limber MD on 10/10/2016 05:27 PM  Patient ID:   [id number] Patient: Julie [name]  Date of Birth: 02/27/1941 Visit Type: Office Visit   Date: 08/26/2016 12:15 PM Provider: Marchia Meiers. Vertell Limber MD   This 76 year old female presents for back pain.  History of Present Illness: 1.  back pain    08/10/16 right L3-4, L4-5 TFESI with Dr. Maryjean Ka   The patient comes in after her epidural injection with Dr. Maryjean Ka earlier this month. He recommended that she come see me after her most recent MRI. Her bone density scan from May 2016 revealed that she has osteoporosis. Pain was 10/10 last week.   Physical Exam: LE strength is normal   07/18/16 MRI: Acute to subacute S3 sacral fracture, likely posttraumatic rather than an insufficiency fracture in light of multiple recent falls. 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 Progressed multifactorial moderate to severe right lateral recess stenosis at L3-4 and L4-5. Increased moderate to severe bilateral L3 and L4 foraminal stenosis.      Medical/Surgical/Interim History Reviewed, no change.   PAST MEDICAL HISTORY, SURGICAL HISTORY, FAMILY HISTORY, SOCIAL HISTORY AND REVIEW OF SYSTEMS  08/26/2016, which I have signed.  Family History: Reviewed, no changes.    Social History: Tobacco use reviewed. Reviewed, no changes.     MEDICATIONS(added, continued or stopped this visit): Started Medication Directions  Instruction Stopped   Armour Thyroid 30 mg tablet      fluoxetine 20 mg capsule take 1 capsule by oral route  every day in the morning  Fosamax 70 mg tablet take 1 tablet by oral route  every week in the morning, at least 30 min before first food, beverage, or medication of day    08/20/2016 hydrocodone 5 mg-acetaminophen 325 mg tablet take 1 tablet by oral route every 8 hours as needed for pain     meloxicam 7.5 mg tablet take 1 tablet by mouth route  every 12 hours    08/29/2013 Neurontin 300 mg capsule take 1 capsule by oral route 3 times every day     omeprazole 40 mg capsule,delayed release take 1 capsule by oral route 2 times every day before a meal     tizanidine 4 mg capsule take 1 capsule by oral route  every 8 hours PRN spasm     Vitamin B-12 1,000 mcg/mL oral drops      Vitamin D3 5,000 unit tablet        ALLERGIES: Ingredient Reaction Medication Name Comment  CODEINE Unknown    PENICILLINS Unknown    LISINOPRIL     OXYCODONE         Vitals Date Temp F BP Pulse Ht In Wt Lb BMI BSA Pain Score  08/26/2016  121/74 63 59 116.8 23.59  3/10      IMPRESSION The patient got little relief from her last epidural injection with Dr. Maryjean Ka. Her most recent imaging reveals that her dextroconvex scoliosis and L3-4, L4-5 stenosis has progressed. I believe she may need an operation to alleviate her pain, but her previous bone density scan from May 2016 revealed that she is osteopenic in her hips and osteoporotic in her wrist. She has been on fosamax for the past year, so I would like to get another bone density scan to see if her bones have gotten stronger since last year. She is scheduled for another injection with Dr. Maryjean Ka next week.   Assessment/Plan # Detail Type Description   1. 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         Pain Assessment/Treatment Pain Scale: 3/10. Method: Numeric Pain Intensity Scale. Location: back. Duration:  varies. Quality: discomforting. Pain Assessment/Treatment follow-up plan of care: Patient taking medication as prescribed..  Fall Risk Plan The patient has not fallen in the last year.  She will go ahead with her scheduled injection next week. Ordered new bone density scan. Follow up after to discuss.              Provider:  Marchia Meiers. Vertell Limber MD  08/26/2016 01:05 PM Dictation edited by: Johnella Moloney    CC Providers: Willey Blade [facility] [address] [place], [place] [address]-              Electronically signed by Marchia Meiers. Vertell Limber MD on 08/29/2016 01:52 PM  Patient ID:   [id number] Patient: Julie [name]  Date of Birth: Jun 09, 1941 Visit Type: Office Visit   Date: 08/05/2016 09:00 AM Provider: Bonna Gains PhD MD   This 76 year old female presents for back pain.  History of Present Illness: 1.  back pain    Extremely pleasant 76 year old right-handed woman who I last saw in September 2014, presenting with right L3 L4 radicular symptoms and some back pain.  She underwent injection therapy with good results up until about July of this year, when she began to extrude his "sciatica".  She was booked for a trip to [place], and [place], and while she was on that trip, sustained to 3 falls, including a  fairly substantial hard fall onto her buttock.  She reports that her right knee "just gave way".  She has not had any more falls, but does note that her right thigh seems to be relatively weak.  She's been trying to ride a bicycle, and do other things to strengthen her leg overall.  When symptoms did not improve, she went back to see Dr. 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  He obtained a new MRI, and an referred her back over here for evaluation and treatment.  On presentation today the patient reports dull, achy pain in the sacral area on the right side where she fell.  Her MRI demonstrates a healing S3  fracture, considered to be post traumatic rather than insufficiency.  Other findings include continued degeneration through scoliotic dextroconvex scoliosis centered through L3.  She reports sharp, shooting, sciatic-type symptoms along the wrist or lateral aspect of her thigh down to the level of her knee, as well into the anterior and anterior medial portion of the thigh, all on the right side.  She has no left-sided symptoms to speak of.        MEDICATIONS(added, continued or stopped this visit): Started Medication Directions Instruction Stopped  08/29/2013 Neurontin 300 mg capsule take 1 capsule by oral route 3 times every day       ALLERGIES: Ingredient Reaction Medication Name Comment  CODEINE Unknown    PENICILLINS Unknown       REVIEW OF SYSTEMS System Neg/Pos Details  Constitutional Negative Chills, fatigue, fever, malaise, night sweats, weight gain and weight loss.  ENMT Negative Ear drainage, hearing loss, nasal drainage, otalgia, sinus pressure and sore throat.  Eyes Negative Eye discharge, eye pain and vision changes.  Respiratory Negative Chronic cough, cough, dyspnea, known TB exposure and wheezing.  Cardio Negative Chest pain, claudication, edema and irregular heartbeat/palpitations.  GI Negative Abdominal pain, blood in stool, change in stool pattern, constipation, decreased appetite, diarrhea, heartburn, nausea and vomiting.  GU Negative Dysuria, hematuria, polyuria, urinary frequency, urinary incontinence and urinary retention.  Endocrine Negative Cold intolerance, heat intolerance, polydipsia and polyphagia.  Neuro Positive Extremity weakness, Gait disturbance.  Neuro Negative Dizziness, headache, memory impairment, numbness in extremity, seizures and tremors.  Psych Negative Anxiety, depression and insomnia.  Integumentary Negative Brittle hair, brittle nails, change in shape/size of mole(s), hair loss, hirsutism, hives, pruritus, rash and skin lesion.  MS  Positive Back pain.  MS Negative Joint pain, joint swelling, muscle weakness and neck pain.  Hema/Lymph Negative Easy bleeding, easy bruising and lymphadenopathy.  Allergic/Immuno Negative Contact allergy, environmental allergies, food allergies and seasonal allergies.  Reproductive Negative Breast discharge and breast lump(s).     Vitals Date Temp F BP Pulse Ht In Wt Lb BMI BSA Pain Score  08/05/2016  153/80 69  117.6   9/10     PHYSICAL EXAM General Level of Distress: no acute distress Overall Appearance: Normal    Cardiovascular Cardiac: normal  Respiratory Lungs: non-labored  Neurological Orientation: normal Recent and Remote Memory: normal Attention Span and Concentration:   normal Language: Fluent, appropriate Fund of Knowledge: normal  Right Left Sensation: normal normal Upper Extremity Coordination: normal normal  Lower Extremity Coordination: normal normal  Musculoskeletal Gait and Station: normal  Motor Strength Lower extremity motor strength was tested in the clinically pertinent muscles. Any abnormal findings will be noted below.   Right Left Deltoid: 5/5 5/5 Biceps: 5/5 5/5 Triceps: 5/5 5/5 Infraspinatus: 5/5 5/5 Wrist Extensor: 5/5 5/5 Grip: 5/5 5/5  Finger Extensor: 5/5 5/5 Hip Flexor: 5/5 5/5 Knee Extensor: 4-/5 5/5 Tib Anterior: 5/5 5/5 EHL: 5/5 5/5 Medial Gastroc: 5/5 5/5   Deep Tendon Reflexes  Right Left Patellar: decrease absent (knee replacement) Achilles: normal normal  Sensory Sensation was tested at C2 to T1 and L1 to S1.   Cranial Nerves II. Optic Nerve/Visual Fields: bilateral normal III. Oculomotor: bilateral EOMI IV. Trochlear: bilateral EOMI V. Trigeminal: bilateral Facial sensation intact, symetric temporalis/masseter strength VI. Abducens: bilateral EOMI VII. Facial: bilateral normal VIII. Acoustic/Vestibular: bilateral Intact to finger rub IX. Glossopharyngeal: bilateral Palate elevates symetrically X.  Vagus: bilateral Uvula midline XI. Spinal Accessory: bilateral Normal shoulder shrug XII. Hypoglossal: bilateral Tongue midline  Motor and other Tests Pronator drift: absent    Additional Findings:  Right hip adductors 4-/ 5   DIAGNOSTIC RESULTS EXAM: MRI LUMBAR SPINE WITHOUT CONTRAST  TECHNIQUE: Multiplanar, multisequence MR imaging of the lumbar spine was performed. No intravenous contrast was administered.  COMPARISON: Lumbar radiographs 06/22/2016 and earlier. Lumbar MRI 04/05/2012.  FINDINGS: Segmentation: Normal common this corresponds to the same numbering system used on the 2013.  Alignment: Dextro convex lumbar scoliosis is increased since 2013. Mild grade 1 anterolisthesis at L4-L5 also is increased. Chronic grade 1 anterolisthesis at L5-S1 appears stable. Trace anterolisthesis at L3-L4 is stable.  Vertebrae: No marrow edema in the visible lower thoracic or lumbar spine. Stable chronic L4 inferior endplate and L5 superior endplate deformities.  However, there is confluent marrow edema in the S3 sacral level tracking out into the bilateral S3 sacral ala. See series 6. This level is not included on axial images.  Conus medullaris: Extends to the L1 level and appears normal.  Paraspinal and other soft tissues: Stable and negative. Suggestion of some diverticulosis of the distal colon in the pelvis.  Disc levels: T12-L1: Mild disc bulge.  L1-L2: Progressed left eccentric circumferential disc bulge and moderate facet hypertrophy greater on the left. New moderate left lateral recess stenosis. No significant spinal stenosis. New mild to moderate left L1 foraminal stenosis.  L2-L3: Mild regression of left lateral recess stenosis previously related to disc bulge and facet hypertrophy plus left facet subligamentous synovial cyst which has resolved. There is residual mild left lateral recess stenosis. Moderate facet hypertrophy is stable. Moderate left and  mild right L2 foraminal stenosis has not significantly changed.  L3-L4: Chronic circumferential disc bulge and broad-based superimposed central disc protrusion which has mildly progressed (series 9, image 21). Progressed moderate facet hypertrophy greater on the right. Increased and now moderate to severe right lateral recess stenosis. Increased mild spinal stenosis. Increased moderate to severe bilateral L3 foraminal stenosis.  L4-L5: Mild progression of circumferential disc osteophyte complex. Progressed severe facet hypertrophy, but facet joint fluid has regressed. Increased moderate to severe right lateral recess stenosis. Increased moderate left L4 foraminal stenosis. Severe right L4 foraminal stenosis is stable.  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 No stenosis.  IMPRESSION: 1. Acute to subacute S3 sacral fracture, likely posttraumatic rather than an insufficiency fracture in light of multiple recent falls. 2. 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 3. Progressed multifactorial moderate to severe right lateral recess stenosis at L3-L4 and L4-L5. Increased moderate to severe bilateral L3 and L4 foraminal stenosis.   Electronically Signed By: Genevie Ann M.D. On: 07/18/2016 20:28     IMPRESSION dextroconvex scoliosis, L3 L4 and L4 L5 foraminal stenosis with clinical signs on physical  exam  Assessment/Plan # Detail Type Description   1. Assessment Scoliosis (and kyphoscoliosis), idiopathic (M41.20).       2. Assessment Lumbar radiculopathy (M54.16).   Plan Orders TFESI - right - L3-L4 AND  L4-L5.         1) trial of right L3 L4 L4 L5 TFESI 2 Plan to observe for not only pain control but recovery of muscle strength.  If we cannot accomplish this, she needs neurosurgical consultation.  If she needs neurosurgical consultation, we'll need to  make sure that we obtain bone density studies, done within the last 2 years, or order new ones as needed.  Would also go ahead and obtain scoliosis films.  Orders: Office Procedures/Services: Assessment Service Comments  M54.16 TFESI - right - L3-L4 AND  L4-L5               Provider:  Bonna Gains PhD MD  08/05/2016 10:14 AM Dictation edited by: Bonna Gains    CC Providers: Willey Blade [facility] [address], [place] [address]-              Electronically signed by Bonna Gains PhD MD on 08/05/2016 10:14 AM

## 2017-01-22 NOTE — Brief Op Note (Signed)
01/22/2017  6:07 PM  PATIENT:  Julie [name]  76 y.o. 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  POST-OPERATIVE DIAGNOSIS:  Lumbar Stenosis with neurogenic claudication, scoliosis, foraminal stenosis, spondylolisthesis L 34, L 45 levels  PROCEDURE:  Procedure(s): Left Lumbar three-four, Lumbar four-five  Anterior lateral lumbar interbody fusion (Left) Lumbar Percutaneous Pedicle Screw Placement Lumbar three-four, Lumbar four-five (N/A)  SURGEON:  Surgeon(s) and Role:    * Erline Levine, MD - Primary    * Newman Pies, MD - Assisting  PHYSICIAN ASSISTANT:   ASSISTANTS: Poteat, RN   ANESTHESIA:   general  EBL:  Total I/O In: 2300 [I.V.:2300] Out: 0960 [Urine:1400; Blood:10]  BLOOD ADMINISTERED:none  DRAINS: none   LOCAL MEDICATIONS USED:  MARCAINE    and LIDOCAINE   SPECIMEN:  No Specimen  DISPOSITION OF SPECIMEN:  N/A  COUNTS:  YES  TOURNIQUET:  * No tourniquets in log *  DICTATION: DICTATION: Patient is a 76 year old with severe spondylosis stenosis and scoliosis of the lumbar spine. It was elected to take her to surgery for anterolateral decompression and posterior pedicle screw fixation.  Procedure: Patient was brought to the operating room and placed in a right lateral decubitus position on the operative table and using orthogonally projected C-arm fluoroscopy the patient was placed so that the  L3-4 and L4-5 levels were visualized in AP and lateral plane. The patient was then taped into position. The table was flexed so as to expose the L4-5 level as the patient has a high iliac crest. Skin was marked along with a posterior finger dissection incision. Her flank was then prepped and draped in usual sterile fashion and incisions were made sequentially at L4-5 L3-4 levels. Posterior finger dissection was made to enter the retroperitoneal space and then  subsequently the probe was inserted into the psoas muscle from the left side initially at the L4-5 level. After mapping the neural elements were able to dock the probe per the midpoint of this vertebral level and without indications electrically of too close proximity to the neural tissues. Subsequently the self-retaining tractor was after sequential dilators were utilized the shim was employed and the interspace was cleared of psoas muscle and then incised. A thorough discectomy was performed. Angled instruments were used to clear the interspace of disc material. After thorough discectomy was performed and this was performed using AP and lateral fluoroscopy an 8 lordotic by 55 x 22 mm implant was packed with BMP and Attrax. This was tamped into position using the slides and its position was confirmed on AP and lateral fluoroscopy. Subsequently exposure was performed at the L3-4 level and similar dissection was performed with locking of the self-retaining retractor. At this level were able to place a 12 lordotic by 22 x 55 mm implant packed in a similar fashion.  Hemostasis his was assured the wounds were irrigated interrupted Vicryl sutures and occlusive dressings were placed with dermabond.  Patient was then turned into a prone position on the [place] table and using AP and lateral fluoroscopy throughout this portion of the procedure, pedicle screws were placed using Nuvasive Reline cannulated percutaneous screws. 2 screws were placed at L3 (6.5 x 40) and two at L4 (6.5 x 45 Left and 6.5 x 40 Right) and 2 at L5 (6.5 x 45). 55 mm rod on the right and 65 mm rod on the left then affixed to the screw heads  and locked down on  the screws. All connections were then torqued and the Towers were disassembled. The wounds were irrigated, infiltrated with long-acting Marcaine and then closed with 1, 2-0 and 3-0 Vicryl stitches. Sterile occlusive dressing was placed with Dermabond. The patient was then extubated in the  operating room and taken to recovery in stable and satisfactory condition having tolerated her operation well. Counts were correct at the end of the case.   PLAN OF CARE: Admit to inpatient   PATIENT DISPOSITION:  PACU - hemodynamically stable.   Delay start of Pharmacological VTE agent (>24hrs) due to surgical blood loss or risk of bleeding: yes

## 2017-01-22 NOTE — Anesthesia Preprocedure Evaluation (Addendum)
Anesthesia Evaluation  Patient identified by MRN, date of birth, ID band Patient awake    Reviewed: Allergy & Precautions, NPO status , Patient's Chart, lab work & pertinent test results  History of Anesthesia Complications (+) PONV and history of anesthetic complications  Airway Mallampati: II  TM Distance: >3 FB Neck ROM: Full    Dental  (+) Dental Advisory Given, Upper Dentures, Partial Lower   Pulmonary neg pulmonary ROS,    Pulmonary exam normal breath sounds clear to auscultation       Cardiovascular hypertension, (-) angina(-) CAD, (-) Past MI and (-) CHF Normal cardiovascular exam Rhythm:Regular Rate:Normal     Neuro/Psych PSYCHIATRIC DISORDERS Depression negative neurological ROS     GI/Hepatic Neg liver ROS, GERD  Medicated and Controlled,  Endo/Other  Hypothyroidism   Renal/GU negative Renal ROS     Musculoskeletal  (+) Arthritis ,   Abdominal   Peds  Hematology negative hematology ROS (+)   Anesthesia Other Findings Day of surgery medications reviewed with the patient.  Reproductive/Obstetrics                            Anesthesia Physical Anesthesia Plan  ASA: II  Anesthesia Plan: General   Post-op Pain Management:    Induction: Intravenous  Airway Management Planned: Oral ETT  Additional Equipment:   Intra-op Plan:   Post-operative Plan: Extubation in OR  Informed Consent: I have reviewed the patients History and Physical, chart, labs and discussed the procedure including the risks, benefits and alternatives for the proposed anesthesia with the patient or authorized representative who has indicated his/her understanding and acceptance.   Dental advisory given  Plan Discussed with: CRNA  Anesthesia Plan Comments: (Risks/benefits of general anesthesia discussed with patient including risk of damage to teeth, lips, gum, and tongue, nausea/vomiting, allergic  reactions to medications, and the possibility of heart attack, stroke and death.  All patient questions answered.  Patient wishes to proceed.)        Anesthesia Quick Evaluation

## 2017-01-22 NOTE — Progress Notes (Signed)
Pharmacy Antibiotic Note  Julie [name] is a 76 y.o. female admitted on 01/22/2017 with surgical prophylaxis.  Pharmacy has been consulted for Vancomycin dosing. Confirmed via surgical note and RN that no drains were placed. Patient is currently afebrile. Patient received 1g IV vancomycin pre-operatively at 1300 PM.   Plan: Vancomycin 1000 mg IV x1 at 1 AM.  Pharmacy will sign off.   Height: 5' (152.4 cm) Weight: 130 lb 1.1 oz (59 kg) IBW/kg (Calculated) : 45.5  Temp (24hrs), Avg:97.7 F (36.5 C), Min:97.5 F (36.4 C), Max:98.2 F (36.8 C)  No results for input(s): WBC, CREATININE, LATICACIDVEN, VANCOTROUGH, VANCOPEAK, VANCORANDOM, GENTTROUGH, GENTPEAK, GENTRANDOM, TOBRATROUGH, TOBRAPEAK, TOBRARND, AMIKACINPEAK, AMIKACINTROU, AMIKACIN in the last 168 hours.  Estimated Creatinine Clearance: 47.3 mL/min (by C-G formula based on SCr of 0.59 mg/dL).    Allergies  Allergen Reactions  . Penicillins Anaphylaxis    REACTION: causes tongue to swell Has patient had a PCN reaction causing immediate rash, facial/tongue/throat swelling, SOB or lightheadedness with hypotension: Yes swelling Has patient had a PCN reaction causing severe rash involving mucus membranes or skin necrosis:  Has patient had a PCN reaction that required hospitalization no Has patient had a PCN reaction occurring within the last 10 years: no If all of the above answers are "NO", then may proceed with Cephalosporin use.   . Adhesive [Tape] Other (See Comments)    Please use paper tape  . Codeine Nausea Only  . Lisinopril     REACTION: wheezing  . Other Other (See Comments)    Pt was having knee surgery and was given an epidural, which did not work.  Julie [name] [Oxycodone-Acetaminophen] Nausea And Vomiting    Antimicrobials this admission: Vancomycin 3/23 x1  Dose adjustments this admission:   Microbiology results: none  Thank you for allowing pharmacy to be a part of this patient's care.  Sloan Leiter, PharmD, BCPS Clinical Pharmacist Clinical phone 01/22/2017 until 11 PM - [phone] After hours, please call #[phone] 01/22/2017 9:15 PM

## 2017-01-22 NOTE — Anesthesia Procedure Notes (Signed)
Procedure Name: Intubation Date/Time: 01/22/2017 1:01 PM Performed by: Rejeana Brock L Pre-anesthesia Checklist: Patient identified, Emergency Drugs available, Suction available and Patient being monitored Patient Re-evaluated:Patient Re-evaluated prior to inductionOxygen Delivery Method: Circle System Utilized Preoxygenation: Pre-oxygenation with 100% oxygen Intubation Type: IV induction Ventilation: Mask ventilation without difficulty Laryngoscope Size: Mac and 3 Grade View: Grade I Tube type: Oral Tube size: 7.0 mm Number of attempts: 1 Airway Equipment and Method: Stylet and Oral airway Placement Confirmation: ETT inserted through vocal cords under direct vision,  positive ETCO2 and breath sounds checked- equal and bilateral Secured at: 21 cm Tube secured with: Tape Dental Injury: Teeth and Oropharynx as per pre-operative assessment

## 2017-01-22 NOTE — Progress Notes (Signed)

## 2017-01-22 NOTE — Progress Notes (Signed)
PHARMACIST - PHYSICIAN ORDER COMMUNICATION  CONCERNING: P&T Medication Policy on Herbal Medications  DESCRIPTION:  This patient's order for:  Tumeric, cinnamon, and biotin  have been noted.  This product(s) is classified as an "herbal" or natural product. Due to a lack of definitive safety studies or FDA approval, nonstandard manufacturing practices, plus the potential risk of unknown drug-drug interactions while on inpatient medications, the Pharmacy and Therapeutics Committee does not permit the use of "herbal" or natural products of this type within [facility]   ACTION TAKEN: The pharmacy department is unable to verify this order at this time. Please reevaluate patient's clinical condition at discharge and address if the herbal or natural product(s) should be resumed at that time.

## 2017-01-22 NOTE — Progress Notes (Signed)
Awake, alert, conversant.  Full strength in both legs.  No numbness.  Pain much better.

## 2017-01-22 NOTE — Op Note (Signed)
01/22/2017  6:07 PM  PATIENT:  Julie [name]  76 y.o. 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  POST-OPERATIVE DIAGNOSIS:  Lumbar Stenosis with neurogenic claudication, scoliosis, foraminal stenosis, spondylolisthesis L 34, L 45 levels  PROCEDURE:  Procedure(s): Left Lumbar three-four, Lumbar four-five  Anterior lateral lumbar interbody fusion (Left) Lumbar Percutaneous Pedicle Screw Placement Lumbar three-four, Lumbar four-five (N/Julie)  SURGEON:  Surgeon(s) and Role:    * Erline Levine, MD - Primary    * Newman Pies, MD - Assisting  PHYSICIAN ASSISTANT:   ASSISTANTS: Poteat, RN   ANESTHESIA:   general  EBL:  Total I/O In: 2300 [I.V.:2300] Out: 8786 [Urine:1400; Blood:10]  BLOOD ADMINISTERED:none  DRAINS: none   LOCAL MEDICATIONS USED:  MARCAINE    and LIDOCAINE   SPECIMEN:  No Specimen  DISPOSITION OF SPECIMEN:  N/Julie  COUNTS:  YES  TOURNIQUET:  * No tourniquets in log *  DICTATION: DICTATION: Patient is Julie [name] with severe spondylosis stenosis and scoliosis of the lumbar spine. It was elected to take her to surgery for anterolateral decompression and posterior pedicle screw fixation.  Procedure: Patient was brought to the operating room and placed in Julie right lateral decubitus position on the operative table and using orthogonally projected C-arm fluoroscopy the patient was placed so that the  L3-4 and L4-5 levels were visualized in AP and lateral plane. The patient was then taped into position. The table was flexed so as to expose the L4-5 level as the patient has Julie high iliac crest. Skin was marked along with Julie posterior finger dissection incision. Her flank was then prepped and draped in usual sterile fashion and incisions were made sequentially at L4-5 L3-4 levels. Posterior finger dissection was made to enter the retroperitoneal space and then  subsequently the probe was inserted into the psoas muscle from the left side initially at the L4-5 level. After mapping the neural elements were able to dock the probe per the midpoint of this vertebral level and without indications electrically of too close proximity to the neural tissues. Subsequently the self-retaining tractor was after sequential dilators were utilized the shim was employed and the interspace was cleared of psoas muscle and then incised. Julie thorough discectomy was performed. Angled instruments were used to clear the interspace of disc material. After thorough discectomy was performed and this was performed using AP and lateral fluoroscopy an 8 lordotic by 55 x 22 mm implant was packed with BMP and Attrax. This was tamped into position using the slides and its position was confirmed on AP and lateral fluoroscopy. Subsequently exposure was performed at the L3-4 level and similar dissection was performed with locking of the self-retaining retractor. At this level were able to place Julie 12 lordotic by 22 x 55 mm implant packed in Julie similar fashion.  Hemostasis his was assured the wounds were irrigated interrupted Vicryl sutures and occlusive dressings were placed with dermabond.  Patient was then turned into Julie prone position on the [place] table and using AP and lateral fluoroscopy throughout this portion of the procedure, pedicle screws were placed using Nuvasive Reline cannulated percutaneous screws. 2 screws were placed at L3 (6.5 x 40) and two at L4 (6.5 x 45 Left and 6.5 x 40 Right) and 2 at L5 (6.5 x 45). 55 mm rod on the right and 65 mm rod on the left then affixed to the screw heads  and locked down on  the screws. All connections were then torqued and the Towers were disassembled. The wounds were irrigated, infiltrated with long-acting Marcaine and then closed with 1, 2-0 and 3-0 Vicryl stitches. Sterile occlusive dressing was placed with Dermabond. The patient was then extubated in the  operating room and taken to recovery in stable and satisfactory condition having tolerated her operation well. Counts were correct at the end of the case.   PLAN OF CARE: Admit to inpatient   PATIENT DISPOSITION:  PACU - hemodynamically stable.   Delay start of Pharmacological VTE agent (>24hrs) due to surgical blood loss or risk of bleeding: yes

## 2017-01-22 NOTE — Progress Notes (Signed)
Patient admitted from PACU. Patient alert and oriented x 4. Patient oriented to room, made comfortable at this time. Will continue to monitor.

## 2017-01-22 NOTE — Transfer of Care (Signed)
Immediate Anesthesia Transfer of Care Note  Patient: Julie [name]  Procedure(s) Performed: Procedure(s): Left Lumbar three-four, Lumbar four-five  Anterior lateral lumbar interbody fusion (Left) Lumbar Percutaneous Pedicle Screw Placement Lumbar three-four, Lumbar four-five (N/A)  Patient Location: PACU  Anesthesia Type:General  Level of Consciousness: awake, oriented, sedated, patient cooperative and responds to stimulation  Airway & Oxygen Therapy: Patient Spontanous Breathing and Patient connected to nasal cannula oxygen  Post-op Assessment: Report given to RN, Post -op Vital signs reviewed and stable, Patient moving all extremities and Patient moving all extremities X 4  Post vital signs: Reviewed and stable  Last Vitals:  Vitals:   01/22/17 0929  BP: (!) 141/72  Pulse: 67  Resp: 20  Temp: 36.8 C    Last Pain:  Vitals:   01/22/17 0929  TempSrc: Oral         Complications: No apparent anesthesia complications

## 2017-01-23 ENCOUNTER — Other Ambulatory Visit: Payer: Self-pay | Admitting: Internal Medicine

## 2017-01-23 LAB — GLUCOSE, CAPILLARY: Glucose-Capillary: 99 mg/dL (ref 65–99)

## 2017-01-23 MED ORDER — PANTOPRAZOLE SODIUM 40 MG PO TBEC
40.0000 mg | DELAYED_RELEASE_TABLET | Freq: Every day | ORAL | Status: DC
  Administered 2017-01-23 – 2017-01-24 (×2): 40 mg via ORAL
  Filled 2017-01-23 (×2): qty 1

## 2017-01-23 MED ORDER — LACTATED RINGERS IV SOLN
Freq: Once | INTRAVENOUS | Status: AC
  Administered 2017-01-23: 06:00:00 via INTRAVENOUS

## 2017-01-23 NOTE — Progress Notes (Addendum)
Writer was about to ambulate patient at this time. Patient's blood pressure is 92/35. MD notified. New order received. Will continue to monitor.

## 2017-01-23 NOTE — Evaluation (Signed)
Occupational Therapy Evaluation Patient Details Name: Julie [name] MRN: [id number] DOB: 1941/05/22 Today's Date: 01/23/2017    History of Present Illness Pt is a 76 y.o. female s/p L3-5 XLIF. No pertinent PMHx in chart.   Clinical Impression   Pt reports she was independent with ADL PTA. Currently pt overall min guard for ADL and functional mobility with the exception of min assist for donning brace and LB ADL. Began back, safety, and ADL education with pt and daughter. Pt planning to d/c home with 24/7 supervision from family. Pt would benefit from continued skilled OT to address established goals.    Follow Up Recommendations  No OT follow up;Supervision/Assistance - 24 hour (initially)    Equipment Recommendations  3 in 1 bedside commode    Recommendations for Other Services       Precautions / Restrictions Precautions Precautions: Back;Fall Precaution Booklet Issued: Yes (comment) Precaution Comments: Educated pt on back precautions Restrictions Weight Bearing Restrictions: No      Mobility Bed Mobility               General bed mobility comments: Pt OOB in chair upon arrival  Transfers Overall transfer level: Needs assistance Equipment used: Rolling walker (2 wheeled) Transfers: Sit to/from Stand Sit to Stand: Min guard         General transfer comment: Cues for hand placement. Min guard for safety    Balance Overall balance assessment: Needs assistance Sitting-balance support: Feet supported;No upper extremity supported Sitting balance-Leahy Scale: Good     Standing balance support: Single extremity supported Standing balance-Leahy Scale: Fair                             ADL either performed or assessed with clinical judgement   ADL Overall ADL's : Needs assistance/impaired Eating/Feeding: Set up;Sitting   Grooming: Min guard;Standing Grooming Details (indicate cue type and reason): Educated on use of 2 cups for oral  care Upper Body Bathing: Supervision/ safety;Sitting   Lower Body Bathing: Min guard;Sit to/from stand   Upper Body Dressing : Minimal assistance;Standing Upper Body Dressing Details (indicate cue type and reason): to readjust brace Lower Body Dressing: Sit to/from stand;Minimal assistance Lower Body Dressing Details (indicate cue type and reason): Pt able to cross foot over opposite knee on R side, difficulty with L. Educated on compensatory strategies for LB ADL. Husband to assist as needed Toilet Transfer: Min guard;Ambulation;BSC;RW   Toileting- Water quality scientist and Hygiene: Min guard;Sit to/from stand       Functional mobility during ADLs: Min guard;Rolling walker General ADL Comments: Educated pt on maintaining back precautions during functional activities, keeping frequently used items at Musician      Pertinent Vitals/Pain Pain Assessment: No/denies pain     Hand Dominance     Extremity/Trunk Assessment Upper Extremity Assessment Upper Extremity Assessment: Overall WFL for tasks assessed   Lower Extremity Assessment Lower Extremity Assessment: Defer to PT evaluation   Cervical / Trunk Assessment Cervical / Trunk Assessment: Other exceptions Cervical / Trunk Exceptions: s/p spinal sx   Communication Communication Communication: No difficulties   Cognition Arousal/Alertness: Awake/alert Behavior During Therapy: WFL for tasks assessed/performed Overall Cognitive Status: Within Functional Limits for tasks assessed  General Comments       Exercises     Shoulder Instructions      Home Living Family/patient expects to be discharged to:: Private residence Living Arrangements: Spouse/significant other Available Help at Discharge: Family (husband works during the day) Type of Home: House Home Access: Level entry     [place]: Two level;Able to  live on main level with bedroom/bathroom;Laundry or work area in basement     [employer]: Occupational psychologist: [place]: Clinical cytogeneticist - 2 wheels          Prior Functioning/Environment Level of Independence: Independent        Comments: hairdresser, still drives        OT Problem List: Decreased strength;Impaired balance (sitting and/or standing);Decreased knowledge of use of DME or AE;Decreased knowledge of precautions;Pain      OT Treatment/Interventions: Self-care/ADL training;Energy conservation;DME and/or AE instruction;Therapeutic activities;Balance training;Patient/family education    OT Goals(Current goals can be found in the care plan section) Acute Rehab OT Goals Patient Stated Goal: return home OT Goal Formulation: With patient/family Time For Goal Achievement: 02/06/17 Potential to Achieve Goals: Good ADL Goals Pt Will Perform Tub/Shower Transfer: with supervision;ambulating;Shower transfer;rolling walker;shower seat Additional ADL Goal #1: Pt will independently verbally recall 3/3 back precautions and maintain throughout ADL. Additional ADL Goal #2: Pt will perform bed mobility at supervision level with HOB flat and without bed rails. Additional ADL Goal #3: Pt will don/doff back brace with set up as precursor to ADL and functional mobility.  OT Frequency: Min 2X/week   Barriers to D/C:            Co-evaluation PT/OT/SLP Co-Evaluation/Treatment: Yes Reason for Co-Treatment: Other (comment);For patient/therapist safety (due to dizziness and low BP) PT goals addressed during session: Mobility/safety with mobility OT goals addressed during session: ADL's and self-care      End of Session Equipment Utilized During Treatment: Rolling walker;Back brace  Activity Tolerance: Patient tolerated treatment well Patient left: in chair;with call bell/phone within reach;with family/visitor present  OT Visit  Diagnosis: Unsteadiness on feet (R26.81)                Time: 9741-6384 OT Time Calculation (min): 21 min Charges:  OT General Charges $OT Visit: 1 Procedure OT Evaluation $OT Eval Moderate Complexity: 1 Procedure G-Codes:     Avangeline Stockburger A. Ulice Brilliant, M.S., OTR/L Pager: [phone] 01/23/2017, 3:11 PM

## 2017-01-23 NOTE — Progress Notes (Signed)
Patient ID: Julie [name], female   DOB: 1941/07/20, 76 y.o.   MRN: [id number] Vitals signs are stable currently however blood pressures on the low side Patient felt faint with mobilization this morning Currently receiving 150 mL of fluid in an hour plus has been encouraged to drink Back is moderately sore To be mobilized again tomorrow after further hydration has occurred Dressings dry and clean

## 2017-01-23 NOTE — Evaluation (Signed)
Physical Therapy Evaluation Patient Details Name: Julie [name] MRN: [id number] DOB: 01-29-1941 Today's Date: 01/23/2017   History of Present Illness  Pt is a 76 y.o. female s/p L3-5 XLIF. No pertinent PMHx in chart.  Clinical Impression  Patient demonstrates deficits in functional mobility as indicated below. Will need continued skilled PT to address deficits and maximize function. Will see as indicated and progress as tolerated.     Follow Up Recommendations No PT follow up;Supervision/Assistance - 24 hour    Equipment Recommendations  Rolling walker with 5" wheels    Recommendations for Other Services       Precautions / Restrictions Precautions Precautions: Back;Fall Precaution Booklet Issued: Yes (comment) Precaution Comments: Educated pt on back precautions Restrictions Weight Bearing Restrictions: No      Mobility  Bed Mobility               General bed mobility comments: Pt OOB in chair upon arrival  Transfers Overall transfer level: Needs assistance Equipment used: Rolling walker (2 wheeled) Transfers: Sit to/from Stand Sit to Stand: Min guard         General transfer comment: Cues for hand placement. Min guard for safety  Ambulation/Gait Ambulation/Gait assistance: Min guard;Min assist Ambulation Distance (Feet): 80 Feet Assistive device: Rolling walker (2 wheeled) Gait Pattern/deviations: Step-through pattern;Shuffle;Decreased stride length;Narrow base of support Gait velocity: decreased   General Gait Details: increased sway with mobility, max verbal interaction to assess patient status, cues for increased cadence and upright posture. min assist for stability  Stairs            Wheelchair Mobility    Modified Rankin (Stroke Patients Only)       Balance Overall balance assessment: Needs assistance Sitting-balance support: Feet supported;No upper extremity supported Sitting balance-Leahy Scale: Good     Standing balance  support: Single extremity supported Standing balance-Leahy Scale: Fair                               Pertinent Vitals/Pain Pain Assessment: No/denies pain    Home Living Family/patient expects to be discharged to:: Private residence Living Arrangements: Spouse/significant other Available Help at Discharge: Family (husband works during the day) Type of Home: House Home Access: Level entry     [place]: Two level;Able to live on main level with bedroom/bathroom;Laundry or work area in [place]: Clinical cytogeneticist - 2 wheels      Prior Function Level of Independence: Independent         Comments: hairdresser, still drives     Journalist, newspaper        Extremity/Trunk Assessment   Upper Extremity Assessment Upper Extremity Assessment: Overall WFL for tasks assessed    Lower Extremity Assessment Lower Extremity Assessment: Overall WFL for tasks assessed    Cervical / Trunk Assessment Cervical / Trunk Assessment: Other exceptions Cervical / Trunk Exceptions: s/p spinal sx  Communication   Communication: No difficulties  Cognition Arousal/Alertness: Awake/alert Behavior During Therapy: WFL for tasks assessed/performed Overall Cognitive Status: Within Functional Limits for tasks assessed                                        General Comments      Exercises     Assessment/Plan    PT Assessment Patient needs continued PT services  PT Problem List Decreased strength;Decreased  activity tolerance;Decreased balance;Decreased mobility;Decreased safety awareness;Pain       PT Treatment Interventions DME instruction;Gait training;Functional mobility training;Therapeutic activities;Therapeutic exercise;Balance training;Patient/family education;Neuromuscular re-education    PT Goals (Current goals can be found in the Care Plan section)  Acute Rehab PT Goals Patient Stated Goal: return home PT Goal Formulation: With  patient Time For Goal Achievement: 02/06/17 Potential to Achieve Goals: Good    Frequency Min 5X/week   Barriers to discharge        Co-evaluation PT/OT/SLP Co-Evaluation/Treatment: Yes Reason for Co-Treatment: Other (comment);For patient/therapist safety (due to dizziness and low BP) PT goals addressed during session: Mobility/safety with mobility OT goals addressed during session: ADL's and self-care       End of Session Equipment Utilized During Treatment: Gait belt;Back brace Activity Tolerance: Patient limited by fatigue Patient left: in chair;with call bell/phone within reach;with family/visitor present Nurse Communication: Mobility status PT Visit Diagnosis: Unsteadiness on feet (R26.81)    Time: 7867-5449 PT Time Calculation (min) (ACUTE ONLY): 21 min   Charges:   PT Evaluation $PT Eval Moderate Complexity: 1 Procedure     PT G Codes:        Alben Deeds, PT DPT  [phone]   Duncan Dull 01/23/2017, 6:05 PM

## 2017-01-24 NOTE — Progress Notes (Signed)
qPhysical Therapy Treatment Patient Details Name: Julie [name] MRN: [id number] DOB: 1941-06-24 Today's Date: 01/24/2017    History of Present Illness Pt is a 76 y.o. female s/p L3-5 XLIF. No pertinent PMHx in chart.    PT Comments    Patient seen for mobility progression. Tolerated activity well despite increased soreness and "rough morning". Patient states that she feels very groggy from the combination of pain medication and sleeping pill. Feel patient will benefit significantly from additional mobility. Recommend continued ambulation with staff throughout the day, PT will follow up with patient again tomorrow.  Follow Up Recommendations  No PT follow up;Supervision/Assistance - 24 hour     Equipment Recommendations  Rolling walker with 5" wheels    Recommendations for Other Services       Precautions / Restrictions Precautions Precautions: Back;Fall Precaution Booklet Issued: Yes (comment) Precaution Comments: Educated pt on back precautions Restrictions Weight Bearing Restrictions: No    Mobility  Bed Mobility Overal bed mobility: Needs Assistance Bed Mobility: Rolling;Sit to Sidelying Rolling: Supervision       Sit to sidelying: Supervision General bed mobility comments: VCs for technique, good performance, increased time to perform  Transfers Overall transfer level: Needs assistance Equipment used: Rolling walker (2 wheeled) Transfers: Sit to/from Stand Sit to Stand: Min guard         General transfer comment: VCs for hand placement and positioning at edge of chair, no difficulty from [facility]  Ambulation/Gait Ambulation/Gait assistance: Min guard Ambulation Distance (Feet): 160 Feet Assistive device: Rolling walker (2 wheeled) Gait Pattern/deviations: Step-through pattern;Shuffle;Decreased stride length;Narrow base of support Gait velocity: decreased Gait velocity interpretation: Below normal speed for age/gender General Gait Details: Modest  instability noted, VCs for upright posture, relaxation of UEs, and increased cadence   Stairs            Wheelchair Mobility    Modified Rankin (Stroke Patients Only)       Balance Overall balance assessment: Needs assistance Sitting-balance support: Feet supported;No upper extremity supported Sitting balance-Leahy Scale: Good     Standing balance support: Bilateral upper extremity supported Standing balance-Leahy Scale: Fair Standing balance comment: continues to [place] RW for comfort, was able to stand at sink and perform hand hygiene without UE support                            Cognition Arousal/Alertness: Awake/alert Behavior During Therapy: WFL for tasks assessed/performed Overall Cognitive Status: Within Functional Limits for tasks assessed                                        Exercises      General Comments        Pertinent Vitals/Pain Pain Assessment: Faces Faces Pain Scale: Hurts even more Pain Location: back Pain Descriptors / Indicators: Operative site guarding;Sore Pain Intervention(s): Monitored during session    Home Living                      Prior Function            PT Goals (current goals can now be found in the care plan section) Acute Rehab PT Goals Patient Stated Goal: return home PT Goal Formulation: With patient Time For Goal Achievement: 02/06/17 Potential to Achieve Goals: Good Progress towards PT goals: Progressing toward goals    Frequency  Min 5X/week      PT Plan Current plan remains appropriate    Co-evaluation             End of Session Equipment Utilized During Treatment: Gait belt;Back brace Activity Tolerance: Patient limited by fatigue Patient left: in bed;with call bell/phone within reach;with bed alarm set;with family/visitor present Nurse Communication: Mobility status PT Visit Diagnosis: Unsteadiness on feet (R26.81)     Time: 0923-3007 PT Time  Calculation (min) (ACUTE ONLY): 17 min  Charges:  $Gait Training: 8-22 mins                    G Codes:       Alben Deeds, PT DPT  [phone]    Duncan Dull 01/24/2017, 9:47 AM

## 2017-01-24 NOTE — Progress Notes (Signed)
Patient ambulated in hall way X2  Tolerated activity well.

## 2017-01-24 NOTE — Anesthesia Postprocedure Evaluation (Signed)
Anesthesia Post Note  Patient: Julie [name]  Procedure(s) Performed: Procedure(s) (LRB): Left Lumbar three-four, Lumbar four-five  Anterior lateral lumbar interbody fusion (Left) Lumbar Percutaneous Pedicle Screw Placement Lumbar three-four, Lumbar four-five (N/A)  Patient location during evaluation: PACU Anesthesia Type: General Level of consciousness: awake and alert Pain management: pain level controlled Vital Signs Assessment: post-procedure vital signs reviewed and stable Respiratory status: spontaneous breathing, nonlabored ventilation, respiratory function stable and patient connected to nasal cannula oxygen Cardiovascular status: stable and blood pressure returned to baseline Postop Assessment: no signs of nausea or vomiting Anesthetic complications: no       Last Vitals:  Vitals:   01/24/17 1404 01/24/17 1712  BP: (!) 108/53 (!) 118/49  Pulse: 79 92  Resp: 20 18  Temp: 36.9 C 36.9 C    Last Pain:  Vitals:   01/24/17 1712  TempSrc: Oral  PainSc:                  Catalina Gravel

## 2017-01-24 NOTE — Progress Notes (Signed)
Occupational Therapy Treatment Patient Details Name: EVIA GOLDSMITH MRN: [id number] DOB: 07/12/1941 Today's Date: 01/24/2017    History of present illness Pt is a 76 y.o. female s/p L3-5 XLIF. No pertinent PMHx in chart.   OT comments  Pt able to perform toilet and walk in shower transfer with min guard assist. Pt performed bed mobility and donning brace with supervision. Pt able to recall precautions and maintain throughout ADL. D/c plan remains appropriate. Will continue to follow acutely.   Follow Up Recommendations  No OT follow up;Supervision/Assistance - 24 hour (initially)    Equipment Recommendations  3 in 1 bedside commode    Recommendations for Other Services      Precautions / Restrictions Precautions Precautions: Back;Fall Precaution Comments: Pt able to recall precautions and maintain throughout session Required Braces or Orthoses: Spinal Brace Spinal Brace: Lumbar corset Restrictions Weight Bearing Restrictions: No       Mobility Bed Mobility Overal bed mobility: Needs Assistance Bed Mobility: Rolling;Sidelying to Sit Rolling: Supervision Sidelying to sit: Supervision       General bed mobility comments: Supervision for safety. Good log roll technique. HOB flat with use of bed rails  Transfers Overall transfer level: Needs assistance Equipment used: Rolling walker (2 wheeled) Transfers: Sit to/from Stand Sit to Stand: Min guard         General transfer comment: Good hand placement and technique    Balance Overall balance assessment: Needs assistance Sitting-balance support: Feet supported;No upper extremity supported Sitting balance-Leahy Scale: Good     Standing balance support: No upper extremity supported;During functional activity Standing balance-Leahy Scale: Fair Standing balance comment: Able to stand at sink and wash hands without UE support                           ADL either performed or assessed with clinical  judgement   ADL Overall ADL's : Needs assistance/impaired     Grooming: Supervision/safety;Standing;Wash/dry hands           Upper Body Dressing : Set up;Supervision/safety;Sitting Upper Body Dressing Details (indicate cue type and reason): to don brace     Toilet Transfer: Min guard;Ambulation;BSC;RW   Toileting- Clothing Manipulation and Hygiene: Supervision/safety;Sitting/lateral lean Toileting - Clothing Manipulation Details (indicate cue type and reason): for peri care Tub/ Shower Transfer: Min guard;Walk-in shower;Ambulation;Rolling walker   Functional mobility during ADLs: Min guard;Rolling walker       Vision       Perception     Praxis      Cognition Arousal/Alertness: Awake/alert Behavior During Therapy: WFL for tasks assessed/performed Overall Cognitive Status: Within Functional Limits for tasks assessed                                          Exercises     Shoulder Instructions       General Comments      Pertinent Vitals/ Pain       Pain Assessment: Faces Faces Pain Scale: Hurts little more Pain Location: L side, back Pain Descriptors / Indicators: Sore Pain Intervention(s): Monitored during session;Repositioned  Home Living                                          Prior Functioning/Environment  Frequency  Min 2X/week        Progress Toward Goals  OT Goals(current goals can now be found in the care plan section)  Progress towards OT goals: Progressing toward goals  Acute Rehab OT Goals Patient Stated Goal: return home OT Goal Formulation: With patient  Plan Discharge plan remains appropriate    Co-evaluation                 End of Session Equipment Utilized During Treatment: Rolling walker;Back brace  OT Visit Diagnosis: Unsteadiness on feet (R26.81)   Activity Tolerance Patient tolerated treatment well   Patient Left in chair;with call bell/phone within  reach   Nurse Communication          Time: 0511-0211 OT Time Calculation (min): 16 min  Charges: OT General Charges $OT Visit: 1 Procedure OT Treatments $Self Care/Home Management : 8-22 mins  Ahtziry Saathoff A. Ulice Brilliant, M.S., OTR/L Pager: [place] 01/24/2017, 3:54 PM

## 2017-01-24 NOTE — Progress Notes (Signed)
Patient ID: Julie [name], female   DOB: [birth year], 76 y.o.   MRN: [id number] Subjective:  The patient is alert and pleasant. She is ambulating in the hallway with physical therapy. She doesn't feel she is ready to go home yet.  Objective: Vital signs in last 24 hours: Temp:  [98.7 F (37.1 C)-99.3 F (37.4 C)] 99.1 F (37.3 C) (03/25 0858) Pulse Rate:  [76-89] 86 (03/25 0858) Resp:  [18] 18 (03/25 0858) BP: (84-199)/(33-53) 111/46 (03/25 0858) SpO2:  [80 %-98 %] 94 % (03/25 0858)  Intake/Output from previous day: 03/24 0701 - 03/25 0700 In: 480 [P.O.:480] Out: -  Intake/Output this shift: No intake/output data recorded.  Physical exam the patient is alert and pleasant. She is moving her lower extremities well.  Lab Results: No results for input(s): WBC, HGB, HCT, PLT in the last 72 hours. BMET No results for input(s): NA, K, CL, CO2, GLUCOSE, BUN, CREATININE, CALCIUM in the last 72 hours.  Studies/Results: Dg Lumbar Spine 2-3 Views  Result Date: 01/22/2017 CLINICAL DATA:  Spine surgery EXAM: LUMBAR SPINE - 2-3 VIEW; DG C-ARM GT 120 MIN COMPARISON:  CT 11/30/2016 FINDINGS: Total fluoroscopy time was 4 minutes 43 seconds. Four low resolution intraoperative spot views of the lumbar spine are submitted. Level localization slightly difficult. There appears to be posterior stabilization rod and fixating screws from L3 through S5 with interbody devices at L3-L4 and L4-L5. IMPRESSION: Intraoperative fluoroscopic assistance provided during lumbar spine surgery. Electronically Signed   By: Donavan Foil M.D.   On: 01/22/2017 17:52   Dg C-arm Gt 120 Min  Result Date: 01/22/2017 CLINICAL DATA:  Spine surgery EXAM: LUMBAR SPINE - 2-3 VIEW; DG C-ARM GT 120 MIN COMPARISON:  CT 11/30/2016 FINDINGS: Total fluoroscopy time was 4 minutes 43 seconds. Four low resolution intraoperative spot views of the lumbar spine are submitted. Level localization slightly difficult. There appears to be  posterior stabilization rod and fixating screws from L3 through S5 with interbody devices at L3-L4 and L4-L5. IMPRESSION: Intraoperative fluoroscopic assistance provided during lumbar spine surgery. Electronically Signed   By: Donavan Foil M.D.   On: 01/22/2017 17:52    Assessment/Plan: Postop day #2: The patient is progressing well.  LOS: 2 days     Marquies Wanat D 01/24/2017, 11:32 AM

## 2017-01-25 ENCOUNTER — Encounter (HOSPITAL_COMMUNITY): Payer: Self-pay | Admitting: Neurosurgery

## 2017-01-25 MED ORDER — HYDROCODONE-ACETAMINOPHEN 5-325 MG PO TABS: 1.0000 | ORAL_TABLET | ORAL | 0 refills | Status: DC | PRN

## 2017-01-25 MED ORDER — METHOCARBAMOL 500 MG PO TABS: 500.0000 mg | ORAL_TABLET | Freq: Four times a day (QID) | ORAL | 1 refills | Status: DC | PRN

## 2017-01-25 NOTE — Progress Notes (Signed)
qPhysical Therapy Treatment Patient Details Name: Julie [name] MRN: [id number] DOB: 02/10/1941 Today's Date: 01/25/2017    History of Present Illness Pt is a 76 y.o. female s/p L3-5 XLIF. No pertinent PMHx in chart.    PT Comments    Pt progressing towards physical therapy goals. Was able to perform transfers and ambulation with min guard to min guard assist to supervision for safety. Pt anticipates d/c home today. Will continue to follow and progress as able per POC until d/c.    Follow Up Recommendations  Home health PT     Equipment Recommendations  Rolling walker with 5" wheels    Recommendations for Other Services       Precautions / Restrictions Precautions Precautions: Back;Fall Precaution Comments: Pt able to recall precautions and maintain throughout session Required Braces or Orthoses: Spinal Brace Spinal Brace: Lumbar corset Restrictions Weight Bearing Restrictions: No    Mobility  Bed Mobility Overal bed mobility: Needs Assistance Bed Mobility: Rolling;Sidelying to Sit Rolling: Supervision Sidelying to sit: Supervision     Sit to sidelying: Supervision General bed mobility comments: VC's for log roll technique and maintaining precautions.  Transfers Overall transfer level: Needs assistance Equipment used: Rolling walker (2 wheeled) Transfers: Sit to/from Stand Sit to Stand: Min guard         General transfer comment: VC's for hand placement  Ambulation/Gait Ambulation/Gait assistance: Min guard Ambulation Distance (Feet): 250 Feet Assistive device: Rolling walker (2 wheeled) Gait Pattern/deviations: Step-through pattern;Decreased step length - right;Decreased stride length;Narrow base of support Gait velocity: decreased Gait velocity interpretation: Below normal speed for age/gender General Gait Details: VC's for upright posture.  Pt mentioned "muscle soreness" pain in L leg halfway through ambulating.   Stairs Stairs: Yes   Stair  Management: No rails;Forwards;With walker;Step to pattern Number of Stairs: 1 General stair comments: Pt practiced simulated single step (towel roll) over threshold into home x 3.  VC's needed to step closer and not reach forward/bend  before stepping over.   Wheelchair Mobility    Modified Rankin (Stroke Patients Only)       Balance Overall balance assessment: Needs assistance Sitting-balance support: Feet supported;No upper extremity supported Sitting balance-Leahy Scale: Good     Standing balance support: No upper extremity supported;During functional activity Standing balance-Leahy Scale: Fair Standing balance comment: Able to stand at sink and wash hands without UE support                            Cognition Arousal/Alertness: Awake/alert Behavior During Therapy: WFL for tasks assessed/performed Overall Cognitive Status: Within Functional Limits for tasks assessed                                        Exercises      General Comments        Pertinent Vitals/Pain Pain Assessment: Faces Faces Pain Scale: Hurts little more Pain Location: LLE, back Pain Descriptors / Indicators: Sore Pain Intervention(s): Limited activity within patient's tolerance;Monitored during session;Repositioned    Home Living                      Prior Function            PT Goals (current goals can now be found in the care plan section) Acute Rehab PT Goals Patient Stated Goal: Home today PT Goal  Formulation: With patient Time For Goal Achievement: 02/06/17 Potential to Achieve Goals: Good Progress towards PT goals: Progressing toward goals    Frequency    Min 5X/week      PT Plan Discharge plan needs to be updated    Co-evaluation             End of Session Equipment Utilized During Treatment: Gait belt;Back brace Activity Tolerance: Patient tolerated treatment well Patient left: in chair;with call bell/phone within reach;with  chair alarm set Nurse Communication: Mobility status PT Visit Diagnosis: Unsteadiness on feet (R26.81)     Time: 7255-0016 PT Time Calculation (min) (ACUTE ONLY): 24 min  Charges:  $Gait Training: 23-37 mins                    G Codes:       [name], PT, DPT Acute Rehabilitation Services Pager: [phone]    [name] 01/25/2017, 8:59 AM

## 2017-01-25 NOTE — Discharge Summary (Addendum)
Physician Discharge Summary  Patient ID: Julie [name] MRN: [id number] DOB/AGE: 12/22/1940 76 y.o.  Admit date: 01/22/2017 Discharge date: 01/25/2017  Admission Diagnoses: Lumbar Stenosis with neurogenic claudication, scoliosis, foraminal stenosis, spondylolisthesis L 34, L 45 levels     Discharge Diagnoses: Lumbar Stenosis with neurogenic claudication, scoliosis, foraminal stenosis, spondylolisthesis L 34, L 45 levels s/p Left Lumbar three-four, Lumbar four-five  Anterior lateral lumbar interbody fusion (Left) Lumbar Percutaneous Pedicle Screw Placement Lumbar three-four, Lumbar four-five (N/A)     Active Problems:   Lumbar scoliosis   Discharged Condition: good  Hospital Course: Julie [name] was admitted for surgery with dx stenosis, foraminal stenosis, scoliosis, and radiculopathy. Following uncomplicated XLIF U7-2, Z3-6, she recovered nicely and transferred to [facility] for nursing care and therapies. She has mobilized well.   Consults: None  Significant Diagnostic Studies: radiology: X-Ray: intra-op  Treatments: surgery: Left Lumbar three-four, Lumbar four-five  Anterior lateral lumbar interbody fusion (Left) Lumbar Percutaneous Pedicle Screw Placement Lumbar three-four, Lumbar four-five (N/A)     Discharge Exam: Blood pressure (!) 107/54, pulse 72, temperature 99.1 F (37.3 C), temperature source Oral, resp. rate 20, height 5' (1.524 m), weight 59 kg (130 lb 1.1 oz), SpO2 96 %. Alert, conversant. Reports only lumbar pain with mild left thigh soreness. Good strength BLE. Incisions lumbar and left side without erythema, swelling, or drainage beneath honeycomb & Dermabond. No BM yet, but she denies discomfort. No distention.      Disposition: 01-Home or Self Care Discharge to home. Pt verbalizes understanding of d/c instructions and will f/u in office in 3-4 weeks. Pain medication Norco and muscle relaxer Robaxin will be rx'ed by DrStern for prn home use.      Discharge  Instructions    Diet - low sodium heart healthy    Complete by:  As directed    Increase activity slowly    Complete by:  As directed      Allergies as of 01/25/2017      Reactions   Penicillins Anaphylaxis   REACTION: causes tongue to swell Has patient had a PCN reaction causing immediate rash, facial/tongue/throat swelling, SOB or lightheadedness with hypotension: Yes swelling Has patient had a PCN reaction causing severe rash involving mucus membranes or skin necrosis:  Has patient had a PCN reaction that required hospitalization no Has patient had a PCN reaction occurring within the last 10 years: no If all of the above answers are "NO", then may proceed with Cephalosporin use.   Adhesive [tape] Other (See Comments)   Please use paper tape   Codeine Nausea Only   Lisinopril    REACTION: wheezing   Other Other (See Comments)   Pt was having knee surgery and was given an epidural, which did not work.   Percocet [oxycodone-acetaminophen] Nausea And Vomiting      Medication List    TAKE these medications   AIRBORNE Chew Chew 1 tablet by mouth daily.   alendronate 70 MG tablet Commonly known as:  FOSAMAX Take 1 tablet (70 mg total) by mouth every 7 (seven) days. Take with a full glass of water on an empty stomach.   ARMOUR THYROID 30 MG tablet Generic drug:  thyroid take 1 tablet by mouth once daily   BILBERRY PO Take 1,200 mg elemental calcium/kg/hr by mouth daily.   Biotin 3 MG Tabs Take 1 tablet by mouth daily.   CALCIUM 500 + D3 PO Take 1 tablet by mouth 2 (two) times daily.   Cholecalciferol 5000 units capsule  Take 5,000 Units by mouth daily.   Cinnamon 500 MG Tabs Take 1 tablet by mouth daily.   doxycycline 100 MG capsule Commonly known as:  VIBRAMYCIN Take 1 capsule (100 mg total) by mouth 2 (two) times daily.   FLUoxetine 20 MG tablet Commonly known as:  PROZAC take 1 tablet by mouth once daily   gabapentin 300 MG capsule Commonly known as:   NEURONTIN Take 300 mg by mouth 3 (three) times daily.   HYDROcodone-acetaminophen 5-325 MG tablet Commonly known as:  NORCO/VICODIN Take 1 tablet by mouth every 4 (four) hours as needed for moderate pain. What changed:  Another medication with the same name was added. Make sure you understand how and when to take each.   HYDROcodone-acetaminophen 5-325 MG tablet Commonly known as:  NORCO/VICODIN Take 1-2 tablets by mouth every 4 (four) hours as needed for moderate pain or severe pain (breakthrough pain). What changed:  You were already taking a medication with the same name, and this prescription was added. Make sure you understand how and when to take each.   lidocaine 5 % Commonly known as:  LIDODERM Place 1 patch onto the skin daily as needed. Remove & Discard patch within 12 hours or as directed by MD   methocarbamol 500 MG tablet Commonly known as:  ROBAXIN Take 1 tablet (500 mg total) by mouth every 6 (six) hours as needed for muscle spasms.   omeprazole 40 MG capsule Commonly known as:  PRILOSEC take 1 capsule by mouth once daily   TURMERIC CURCUMIN PO Take 1,000 mg by mouth daily.   VITAMIN B-12 PO Take 5,000 mcg by mouth daily.   vitamin E 400 UNIT capsule Take 1 tablet by mouth daily.   zolpidem 10 MG tablet Commonly known as:  AMBIEN take 1 tablet by mouth at bedtime if needed for sleep        Signed: Verdis Prime 01/25/2017, 8:01 AM   Discharge home.  Doing well.

## 2017-01-25 NOTE — Progress Notes (Signed)
Pt discharged at this time with husband taking all personal belongings. IV discontinued, dry dressing applied. Discharge instructions provided with prescriptions with verbal understanding. Surgical dressing clean, dry and intact. No noted distress.

## 2017-01-25 NOTE — Care Management Note (Signed)
Case Management Note  Patient Details  Name: ITZAE MCCURDY MRN: [id number] Date of Birth: 28-Mar-1941  Subjective/Objective:                    Action/Plan: Patient discharging home with self care. Pt with orders for 3 in 1. Jermaine with [facility] notified and will deliver the equipment to the room. Pt states she has transportation home.   Expected Discharge Date:  01/25/17               Expected Discharge Plan:  Home/Self Care  In-House Referral:     Discharge planning Services  CM Consult  Post Acute Care Choice:  Durable Medical Equipment Choice offered to:  Patient  DME Arranged:  3-N-1 DME Agency:  [place]:    [facility] Agency:     Status of Service:  Completed, signed off  If discussed at [place] of Stay Meetings, dates discussed:    Additional Comments:  Pollie Friar, RN 01/25/2017, 9:28 AM

## 2017-01-25 NOTE — Consult Note (Signed)
           [facility] Primary Care Navigator  01/25/2017  Julie [name] 04-12-1941 [id number]    Wentto see patient today at the bedside to identify possible discharge needs but staffreports that she was alreadydischarged.  Patient was discharged home today. Primary care provider's office called ([name])to notify of patient's discharge and possible need for post hospital follow-up and transition of care. Vita Barley mentioned that since patient had elective surgery (s/p lumbar fusion), she will be followed-up by neurology.   For questions, please contact:  Dannielle Huh, BSN, RN- [facility] Primary Care Navigator  Telephone: [phone] [place]

## 2017-01-25 NOTE — Progress Notes (Addendum)
Subjective: Patient reports "I don't hurt in my right leg anymore, just my back"  Objective: Vital signs in last 24 hours: Temp:  [98.4 F (36.9 C)-100.1 F (37.8 C)] 99.1 F (37.3 C) (03/26 0541) Pulse Rate:  [65-92] 72 (03/26 0541) Resp:  [18-20] 20 (03/26 0541) BP: (100-133)/(46-64) 107/54 (03/26 0541) SpO2:  [94 %-100 %] 96 % (03/26 0541)  Intake/Output from previous day: No intake/output data recorded. Intake/Output this shift: No intake/output data recorded.  Alert, conversant. Reports only lumbar pain with mild left thigh soreness. Good strength BLE. Incisions lumbar and left side without erythema, swelling, or drainage beneath honeycomb & Dermabond. No BM yet, but she denies discomfort. No distention.   Lab Results: No results for input(s): WBC, HGB, HCT, PLT in the last 72 hours. BMET No results for input(s): NA, K, CL, CO2, GLUCOSE, BUN, CREATININE, CALCIUM in the last 72 hours.  Studies/Results: No results found.  Assessment/Plan: Improving  LOS: 3 days  Per DrStern, d/c to home. Pt verbalizes understanding of d/c instructions and will f/u in office in 3-4 weeks. Pain medication and muscle relaxer will be rx'ed by DrStern for prn home use.    Verdis Prime 01/25/2017, 7:56 AM    Patient doing well.  Discharge home.

## 2017-01-25 NOTE — Clinical Social Work Note (Signed)
CSW consulted for SNF placement. P/T O/T recommending no follow up. RNCM following for d/c needs. CSW signing off as no further SW interventions needed. Please reconsult if new needs arise.   Julie [name], [place], [place] Work [phone]

## 2017-01-26 MED FILL — Sodium Chloride IV Soln 0.9%: INTRAVENOUS | Qty: 2000 | Status: AC

## 2017-01-26 MED FILL — Heparin Sodium (Porcine) Inj 1000 Unit/ML: INTRAMUSCULAR | Qty: 30 | Status: AC

## 2017-01-28 ENCOUNTER — Telehealth: Payer: Self-pay | Admitting: Family Medicine

## 2017-01-28 DIAGNOSIS — Z981 Arthrodesis status: Secondary | ICD-10-CM | POA: Diagnosis not present

## 2017-01-28 DIAGNOSIS — Z79891 Long term (current) use of opiate analgesic: Secondary | ICD-10-CM | POA: Diagnosis not present

## 2017-01-28 DIAGNOSIS — Z4789 Encounter for other orthopedic aftercare: Secondary | ICD-10-CM | POA: Diagnosis not present

## 2017-01-28 DIAGNOSIS — Z96653 Presence of artificial knee joint, bilateral: Secondary | ICD-10-CM | POA: Diagnosis not present

## 2017-01-28 DIAGNOSIS — Z9181 History of falling: Secondary | ICD-10-CM | POA: Diagnosis not present

## 2017-01-28 DIAGNOSIS — M85851 Other specified disorders of bone density and structure, right thigh: Secondary | ICD-10-CM | POA: Diagnosis not present

## 2017-01-28 DIAGNOSIS — M85852 Other specified disorders of bone density and structure, left thigh: Secondary | ICD-10-CM | POA: Diagnosis not present

## 2017-01-28 DIAGNOSIS — M810 Age-related osteoporosis without current pathological fracture: Secondary | ICD-10-CM | POA: Diagnosis not present

## 2017-01-28 DIAGNOSIS — I10 Essential (primary) hypertension: Secondary | ICD-10-CM | POA: Diagnosis not present

## 2017-01-28 NOTE — Telephone Encounter (Signed)
[facility] received a referral to go out to do skill nursing they were not able to go out due pt did not want to see anyone and that cause [facility] to be behind the 48hrs.

## 2017-02-01 ENCOUNTER — Other Ambulatory Visit: Payer: Self-pay | Admitting: *Deleted

## 2017-02-01 MED ORDER — ALENDRONATE SODIUM 70 MG PO TABS: 70.0000 mg | ORAL_TABLET | ORAL | 11 refills | Status: DC

## 2017-02-01 NOTE — Telephone Encounter (Signed)
FYI - patient refused service

## 2017-02-02 DIAGNOSIS — Z4789 Encounter for other orthopedic aftercare: Secondary | ICD-10-CM | POA: Diagnosis not present

## 2017-02-02 DIAGNOSIS — M85851 Other specified disorders of bone density and structure, right thigh: Secondary | ICD-10-CM | POA: Diagnosis not present

## 2017-02-02 DIAGNOSIS — M810 Age-related osteoporosis without current pathological fracture: Secondary | ICD-10-CM | POA: Diagnosis not present

## 2017-02-02 DIAGNOSIS — M85852 Other specified disorders of bone density and structure, left thigh: Secondary | ICD-10-CM | POA: Diagnosis not present

## 2017-02-02 DIAGNOSIS — Z96653 Presence of artificial knee joint, bilateral: Secondary | ICD-10-CM | POA: Diagnosis not present

## 2017-02-02 DIAGNOSIS — Z981 Arthrodesis status: Secondary | ICD-10-CM | POA: Diagnosis not present

## 2017-02-02 DIAGNOSIS — Z79891 Long term (current) use of opiate analgesic: Secondary | ICD-10-CM | POA: Diagnosis not present

## 2017-02-02 DIAGNOSIS — Z9181 History of falling: Secondary | ICD-10-CM | POA: Diagnosis not present

## 2017-02-02 DIAGNOSIS — I10 Essential (primary) hypertension: Secondary | ICD-10-CM | POA: Diagnosis not present

## 2017-02-10 DIAGNOSIS — M85851 Other specified disorders of bone density and structure, right thigh: Secondary | ICD-10-CM | POA: Diagnosis not present

## 2017-02-10 DIAGNOSIS — Z96653 Presence of artificial knee joint, bilateral: Secondary | ICD-10-CM | POA: Diagnosis not present

## 2017-02-10 DIAGNOSIS — M85852 Other specified disorders of bone density and structure, left thigh: Secondary | ICD-10-CM | POA: Diagnosis not present

## 2017-02-10 DIAGNOSIS — I10 Essential (primary) hypertension: Secondary | ICD-10-CM | POA: Diagnosis not present

## 2017-02-10 DIAGNOSIS — Z981 Arthrodesis status: Secondary | ICD-10-CM | POA: Diagnosis not present

## 2017-02-10 DIAGNOSIS — M810 Age-related osteoporosis without current pathological fracture: Secondary | ICD-10-CM | POA: Diagnosis not present

## 2017-02-10 DIAGNOSIS — Z4789 Encounter for other orthopedic aftercare: Secondary | ICD-10-CM | POA: Diagnosis not present

## 2017-02-10 DIAGNOSIS — Z79891 Long term (current) use of opiate analgesic: Secondary | ICD-10-CM | POA: Diagnosis not present

## 2017-02-10 DIAGNOSIS — Z9181 History of falling: Secondary | ICD-10-CM | POA: Diagnosis not present

## 2017-02-15 DIAGNOSIS — M48062 Spinal stenosis, lumbar region with neurogenic claudication: Secondary | ICD-10-CM | POA: Diagnosis not present

## 2017-02-16 DIAGNOSIS — I10 Essential (primary) hypertension: Secondary | ICD-10-CM | POA: Diagnosis not present

## 2017-02-16 DIAGNOSIS — Z981 Arthrodesis status: Secondary | ICD-10-CM | POA: Diagnosis not present

## 2017-02-16 DIAGNOSIS — Z4789 Encounter for other orthopedic aftercare: Secondary | ICD-10-CM | POA: Diagnosis not present

## 2017-02-16 DIAGNOSIS — Z79891 Long term (current) use of opiate analgesic: Secondary | ICD-10-CM | POA: Diagnosis not present

## 2017-02-16 DIAGNOSIS — M85852 Other specified disorders of bone density and structure, left thigh: Secondary | ICD-10-CM | POA: Diagnosis not present

## 2017-02-16 DIAGNOSIS — Z96653 Presence of artificial knee joint, bilateral: Secondary | ICD-10-CM | POA: Diagnosis not present

## 2017-02-16 DIAGNOSIS — M85851 Other specified disorders of bone density and structure, right thigh: Secondary | ICD-10-CM | POA: Diagnosis not present

## 2017-02-16 DIAGNOSIS — Z9181 History of falling: Secondary | ICD-10-CM | POA: Diagnosis not present

## 2017-02-16 DIAGNOSIS — M810 Age-related osteoporosis without current pathological fracture: Secondary | ICD-10-CM | POA: Diagnosis not present

## 2017-02-19 ENCOUNTER — Encounter: Payer: Self-pay | Admitting: Family Medicine

## 2017-02-19 ENCOUNTER — Ambulatory Visit (INDEPENDENT_AMBULATORY_CARE_PROVIDER_SITE_OTHER): Payer: PPO | Admitting: Family Medicine

## 2017-02-19 VITALS — BP 110/70 | HR 72 | Temp 98.3°F | Wt 126.4 lb

## 2017-02-19 DIAGNOSIS — M1000 Idiopathic gout, unspecified site: Secondary | ICD-10-CM | POA: Diagnosis not present

## 2017-02-19 MED ORDER — METHYLPREDNISOLONE 4 MG PO TBPK: ORAL_TABLET | ORAL | 0 refills | Status: DC

## 2017-02-19 NOTE — Patient Instructions (Signed)
WE NOW OFFER    [facility] FAST TRACK!!!  SAME DAY Appointments for ACUTE CARE  Such as: Sprains, Injuries, cuts, abrasions, rashes, muscle pain, joint pain, back pain Colds, flu, sore throats, headache, allergies, cough, fever  Ear pain, sinus and eye infections Abdominal pain, nausea, vomiting, diarrhea, upset stomach Animal/insect bites  3 Easy Ways to Schedule: Walk-In Scheduling Call in scheduling Mychart Sign-up: [email or web address]         

## 2017-02-19 NOTE — Progress Notes (Signed)
Pre visit review using our clinic review tool, if applicable. No additional management support is needed unless otherwise documented below in the visit note. 

## 2017-02-19 NOTE — Progress Notes (Signed)
   Subjective:    Patient ID: Julie [name], female    DOB: July 27, 1941, 76 y.o.   MRN: [id number]  HPI Here for what she thinks is a case of gout. She used to have gout flares frequently but they have become less frequent. In fact her last flare was 5 years ago. Now for the past 6 days she has had swelling and pain in the left great toe and forefoot. No recent trauma. She took some Colcrys and drank cherry juice with no effect. She is recovering from spinal surgery 4 weeks ago.    Review of Systems  Constitutional: Negative.   Respiratory: Negative.   Cardiovascular: Negative.   Musculoskeletal: Positive for arthralgias and back pain.       Objective:   Physical Exam  Constitutional: She appears well-developed and well-nourished.  Walks with a cane, wearing a lumbar support brace   Cardiovascular: Normal rate, regular rhythm, normal heart sounds and intact distal pulses.   Pulmonary/Chest: Effort normal and breath sounds normal.  Musculoskeletal:  She is swollen and tender in the MTP joint of the left great toe and across the forefoot. The skin is warm but not red          Assessment & Plan:  Acute gout. Treat with a Medrol dose pack. She has Vicodin at home to use if needed.  Julie Penna, MD

## 2017-02-22 DIAGNOSIS — Z981 Arthrodesis status: Secondary | ICD-10-CM | POA: Diagnosis not present

## 2017-02-22 DIAGNOSIS — M85852 Other specified disorders of bone density and structure, left thigh: Secondary | ICD-10-CM | POA: Diagnosis not present

## 2017-02-22 DIAGNOSIS — I10 Essential (primary) hypertension: Secondary | ICD-10-CM | POA: Diagnosis not present

## 2017-02-22 DIAGNOSIS — Z4789 Encounter for other orthopedic aftercare: Secondary | ICD-10-CM | POA: Diagnosis not present

## 2017-02-22 DIAGNOSIS — D1801 Hemangioma of skin and subcutaneous tissue: Secondary | ICD-10-CM | POA: Diagnosis not present

## 2017-02-22 DIAGNOSIS — D225 Melanocytic nevi of trunk: Secondary | ICD-10-CM | POA: Diagnosis not present

## 2017-02-22 DIAGNOSIS — Z79891 Long term (current) use of opiate analgesic: Secondary | ICD-10-CM | POA: Diagnosis not present

## 2017-02-22 DIAGNOSIS — D2272 Melanocytic nevi of left lower limb, including hip: Secondary | ICD-10-CM | POA: Diagnosis not present

## 2017-02-22 DIAGNOSIS — L821 Other seborrheic keratosis: Secondary | ICD-10-CM | POA: Diagnosis not present

## 2017-02-22 DIAGNOSIS — L570 Actinic keratosis: Secondary | ICD-10-CM | POA: Diagnosis not present

## 2017-02-22 DIAGNOSIS — D045 Carcinoma in situ of skin of trunk: Secondary | ICD-10-CM | POA: Diagnosis not present

## 2017-02-22 DIAGNOSIS — Z96653 Presence of artificial knee joint, bilateral: Secondary | ICD-10-CM | POA: Diagnosis not present

## 2017-02-22 DIAGNOSIS — Z85828 Personal history of other malignant neoplasm of skin: Secondary | ICD-10-CM | POA: Diagnosis not present

## 2017-02-22 DIAGNOSIS — L812 Freckles: Secondary | ICD-10-CM | POA: Diagnosis not present

## 2017-02-22 DIAGNOSIS — D485 Neoplasm of uncertain behavior of skin: Secondary | ICD-10-CM | POA: Diagnosis not present

## 2017-02-22 DIAGNOSIS — D2271 Melanocytic nevi of right lower limb, including hip: Secondary | ICD-10-CM | POA: Diagnosis not present

## 2017-02-22 DIAGNOSIS — M85851 Other specified disorders of bone density and structure, right thigh: Secondary | ICD-10-CM | POA: Diagnosis not present

## 2017-02-22 DIAGNOSIS — M810 Age-related osteoporosis without current pathological fracture: Secondary | ICD-10-CM | POA: Diagnosis not present

## 2017-02-22 DIAGNOSIS — Z9181 History of falling: Secondary | ICD-10-CM | POA: Diagnosis not present

## 2017-03-09 ENCOUNTER — Ambulatory Visit (INDEPENDENT_AMBULATORY_CARE_PROVIDER_SITE_OTHER): Payer: PPO | Admitting: Family Medicine

## 2017-03-09 ENCOUNTER — Encounter: Payer: Self-pay | Admitting: Family Medicine

## 2017-03-09 VITALS — BP 110/70 | HR 68 | Temp 98.0°F | Wt 124.8 lb

## 2017-03-09 DIAGNOSIS — M1000 Idiopathic gout, unspecified site: Secondary | ICD-10-CM | POA: Diagnosis not present

## 2017-03-09 MED ORDER — COLCHICINE 0.6 MG PO TABS: 0.6000 mg | ORAL_TABLET | Freq: Two times a day (BID) | ORAL | 3 refills | Status: DC | PRN

## 2017-03-09 NOTE — Patient Instructions (Signed)
Touch base by next week if foot pain not improving.

## 2017-03-09 NOTE — Progress Notes (Signed)
Subjective:     Patient ID: Julie [name], female   DOB: 26-Jun-1941, 76 y.o.   MRN: [id number]  HPI Patient's seen for concern for possible gout flare involving her left foot. She was seen here couple weeks ago and was treated with prednisone which did seem to help temporarily. She has mostly had involvement her feet in the past. She denies any injury. She did have recent back surgery and her immobility may be a risk factor. She initially had more warmth erythema but still has some mild erythema. She has taken Colcrys in the past and requesting refills. She is not aware of any recent dietary sugars. No fever or chills.  Past Medical History:  Diagnosis Date  . Arthritis    lower back shoulder  . Arthropathy, unspecified, site unspecified   . Cancer ([place])    skin -spots on face removed  . Contact dermatitis and other eczema, due to unspecified cause    History  . GERD (gastroesophageal reflux disease)    history - no current prob - no med  . Hypertension    was taking lisinopril but had an allergic reaction to medicaion  . 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 Other bursitis disorders    history  . Personal history of colonic polyps   . PONV (postoperative nausea and vomiting)   . Pure hypercholesterolemia   . SVD (spontaneous vaginal delivery)    x 1  . Unspecified disorder of thyroid   . Unspecified hypothyroidism    Past Surgical History:  Procedure Laterality Date  . ANTERIOR LAT LUMBAR FUSION Left 01/22/2017   Procedure: Left Lumbar three-four, Lumbar four-five  Anterior lateral lumbar interbody fusion;  Surgeon: Erline Levine, MD;  Location: [facility];  Service: Neurosurgery;  Laterality: Left;  . BACK SURGERY  2002   L4/L5  . BLADDER SUSPENSION N/A 03/07/2014   Procedure: TRANSVAGINAL TAPE (TVT) PROCEDURE WITH CYSTO;  Surgeon: Princess Bruins, MD;  Location: [facility];  Service: Gynecology;  Laterality: N/A;  vaginal   .  blephorplasty      bilateral   . BREAST SURGERY     x 2 breast implants, later removed  . COLONOSCOPY    . CYSTOCELE REPAIR N/A 03/07/2014   Procedure: ANTERIOR REPAIR (CYSTOCELE);  Surgeon: Princess Bruins, MD;  Location: [facility];  Service: Gynecology;  Laterality: N/A;  . devaited septum surgery  1975  . EYE SURGERY     bilateral cataract surgery  . KNEE ARTHROSCOPY  10/21/2012   right - Procedure: ARTHROSCOPY KNEE;  Surgeon: Mcarthur Rossetti, MD;  Location: [facility];  Service: Orthopedics;  Laterality: Left;  Left Knee Arthroscopy, Debridement, partial synovectomy  . LUMBAR LAMINECTOMY    . LUMBAR PERCUTANEOUS PEDICLE SCREW 2 LEVEL N/A 01/22/2017   Procedure: Lumbar Percutaneous Pedicle Screw Placement Lumbar three-four, Lumbar four-five;  Surgeon: Erline Levine, MD;  Location: [facility];  Service: Neurosurgery;  Laterality: N/A;  . ROBOTIC ASSISTED LAPAROSCOPIC SACROCOLPOPEXY N/A 03/07/2014   Procedure: ROBOTIC ASSISTED LAPAROSCOPIC SACROCOLPOPEXY With Carrolyn Meiers;  Surgeon: Princess Bruins, MD;  Location: [facility];  Service: Gynecology;  Laterality: N/A;  . ROBOTIC ASSISTED SUPRACERVICAL HYSTERECTOMY WITH BILATERAL SALPINGO OOPHERECTOMY N/A 03/07/2014   Procedure: ROBOTIC ASSISTED SUPRACERVICAL HYSTERECTOMY WITH BILATERAL SALPINGO OOPHORECTOMY;  Surgeon: Princess Bruins, MD;  Location: [facility];  Service: Gynecology;  Laterality: N/A;  4 hrs.  Marland Kitchen TOTAL KNEE ARTHROPLASTY Left 12/30/2012   Procedure: TOTAL KNEE ARTHROPLASTY;  Surgeon: Mcarthur Rossetti, MD;  Location: [facility];  Service: Orthopedics;  Laterality: Left;  Left Total Knee Arthroplasty  . TUBAL LIGATION    . WISDOM TOOTH EXTRACTION      reports that she has never smoked. She has never used smokeless tobacco. She reports that she does not drink alcohol or use drugs. family history includes Cancer in her mother; Diabetes in her sister; Emphysema in her maternal aunt; Heart attack in her father; Stroke in her father. Allergies  Allergen  Reactions  . Penicillins Anaphylaxis    REACTION: causes tongue to swell Has patient had a PCN reaction causing immediate rash, facial/tongue/throat swelling, SOB or lightheadedness with hypotension: Yes swelling Has patient had a PCN reaction causing severe rash involving mucus membranes or skin necrosis:  Has patient had a PCN reaction that required hospitalization no Has patient had a PCN reaction occurring within the last 10 years: no If all of the above answers are "NO", then may proceed with Cephalosporin use.   . Adhesive [Tape] Other (See Comments)    Please use paper tape  . Codeine Nausea Only  . Lisinopril     REACTION: wheezing  . Other Other (See Comments)    Pt was having knee surgery and was given an epidural, which did not work.  [name] [Oxycodone-Acetaminophen] Nausea And Vomiting     Review of Systems  Constitutional: Negative for chills and fever.  Skin: Negative for rash.       Objective:   Physical Exam  Constitutional: She appears well-developed and well-nourished.  Cardiovascular: Normal rate and regular rhythm.   Pulmonary/Chest: Effort normal and breath sounds normal. No respiratory distress. She has no wheezes. She has no rales.  Musculoskeletal:  Left foot reveals some mild edema dorsally. Very mild erythema. She has some tenderness involving the proximal bones of the forefoot but no metatarsal tenderness.       Assessment:     Left foot pain. History of acute gout and likely has some residual gout from recent flare.  Risk factor of recent back surgery and decreased mobility    Plan:     -Refill: Colcrys -take 2 at onset of acute flare and then 1 twice a day. Touch base if symptoms not improving by next week -If symptoms persist or she continues to have frequent recurrence we discussed getting repeat uric acid and consider reinitiating allopurinol. -Stable well-hydrated and we also discussed dietary triggers  Eulas Post MD [place]  Primary Care at [facility]

## 2017-03-16 DIAGNOSIS — Z85828 Personal history of other malignant neoplasm of skin: Secondary | ICD-10-CM | POA: Diagnosis not present

## 2017-03-16 DIAGNOSIS — L821 Other seborrheic keratosis: Secondary | ICD-10-CM | POA: Diagnosis not present

## 2017-03-16 DIAGNOSIS — L820 Inflamed seborrheic keratosis: Secondary | ICD-10-CM | POA: Diagnosis not present

## 2017-03-19 ENCOUNTER — Other Ambulatory Visit: Payer: Self-pay | Admitting: Family Medicine

## 2017-03-19 NOTE — Telephone Encounter (Signed)
Last rx given on 1/23 for #30 with 2 ref

## 2017-03-20 NOTE — Telephone Encounter (Signed)
Refill with 2 additional refills. 

## 2017-03-22 NOTE — Telephone Encounter (Signed)
Rx done. 

## 2017-04-02 ENCOUNTER — Other Ambulatory Visit: Payer: Self-pay | Admitting: Family Medicine

## 2017-04-12 DIAGNOSIS — I10 Essential (primary) hypertension: Secondary | ICD-10-CM | POA: Diagnosis not present

## 2017-04-12 DIAGNOSIS — M5412 Radiculopathy, cervical region: Secondary | ICD-10-CM | POA: Diagnosis not present

## 2017-04-12 DIAGNOSIS — M542 Cervicalgia: Secondary | ICD-10-CM | POA: Diagnosis not present

## 2017-04-12 DIAGNOSIS — M4120 Other idiopathic scoliosis, site unspecified: Secondary | ICD-10-CM | POA: Diagnosis not present

## 2017-04-23 ENCOUNTER — Ambulatory Visit (INDEPENDENT_AMBULATORY_CARE_PROVIDER_SITE_OTHER): Payer: PPO | Admitting: Family Medicine

## 2017-04-23 ENCOUNTER — Encounter: Payer: Self-pay | Admitting: Family Medicine

## 2017-04-23 ENCOUNTER — Telehealth: Payer: Self-pay

## 2017-04-23 VITALS — BP 110/78 | HR 72 | Temp 97.9°F | Wt 127.6 lb

## 2017-04-23 DIAGNOSIS — Z23 Encounter for immunization: Secondary | ICD-10-CM

## 2017-04-23 DIAGNOSIS — S80861A Insect bite (nonvenomous), right lower leg, initial encounter: Secondary | ICD-10-CM | POA: Diagnosis not present

## 2017-04-23 DIAGNOSIS — W5503XA Scratched by cat, initial encounter: Secondary | ICD-10-CM | POA: Diagnosis not present

## 2017-04-23 DIAGNOSIS — L03115 Cellulitis of right lower limb: Secondary | ICD-10-CM

## 2017-04-23 MED ORDER — DOXYCYCLINE HYCLATE 100 MG PO TABS: 100.0000 mg | ORAL_TABLET | Freq: Two times a day (BID) | ORAL | 0 refills | Status: DC

## 2017-04-23 MED ORDER — CLINDAMYCIN HCL 300 MG PO CAPS: 300.0000 mg | ORAL_CAPSULE | Freq: Three times a day (TID) | ORAL | 0 refills | Status: DC

## 2017-04-23 NOTE — Telephone Encounter (Signed)
Pt called to report she has another cat scratch on her leg. She states this happened about a week ago. She reports the area was a deep open wound. She did clean the wound and apply neosporin but area does not seem to be healing well. She says area has black scab and very red around site. She denies any streaking, f/n/v or any other s/s. She would like to know if you would send in antibiotics for her.  Dr. Elease Hashimoto - Please advise. Thanks!

## 2017-04-23 NOTE — Progress Notes (Signed)
HPI:  Cat scratch: - R shin -occurred 1 week ago, cleaned with soap and water and applied neosporin -gradually has developed redness and pain at and around wound, worsening the last 24 hours -no fevers, malaise, chills, drainage -severe pen allergy listed and confirmed -past due on tetanus booster per pt -cat contained, domestic, utd on shots  ROS: See pertinent positives and negatives per HPI.  Past Medical History:  Diagnosis Date  . Arthritis    lower back shoulder  . Arthropathy, unspecified, site unspecified   . Cancer ([place])    skin -spots on face removed  . Contact dermatitis and other eczema, due to unspecified cause    History  . GERD (gastroesophageal reflux disease)    history - no current prob - no med  . Hypertension    was taking lisinopril but had an allergic reaction to medicaion  . 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 Other bursitis disorders    history  . Personal history of colonic polyps   . PONV (postoperative nausea and vomiting)   . Pure hypercholesterolemia   . SVD (spontaneous vaginal delivery)    x 1  . Unspecified disorder of thyroid   . Unspecified hypothyroidism     Past Surgical History:  Procedure Laterality Date  . ANTERIOR LAT LUMBAR FUSION Left 01/22/2017   Procedure: Left Lumbar three-four, Lumbar four-five  Anterior lateral lumbar interbody fusion;  Surgeon: Erline Levine, MD;  Location: [facility];  Service: Neurosurgery;  Laterality: Left;  . BACK SURGERY  2002   L4/L5  . BLADDER SUSPENSION N/A 03/07/2014   Procedure: TRANSVAGINAL TAPE (TVT) PROCEDURE WITH CYSTO;  Surgeon: Princess Bruins, MD;  Location: [facility];  Service: Gynecology;  Laterality: N/A;  vaginal   . blephorplasty      bilateral   . BREAST SURGERY     x 2 breast implants, later removed  . COLONOSCOPY    . CYSTOCELE REPAIR N/A 03/07/2014   Procedure: ANTERIOR REPAIR (CYSTOCELE);  Surgeon: Princess Bruins, MD;  Location:  [facility];  Service: Gynecology;  Laterality: N/A;  . devaited septum surgery  1975  . EYE SURGERY     bilateral cataract surgery  . KNEE ARTHROSCOPY  10/21/2012   right - Procedure: ARTHROSCOPY KNEE;  Surgeon: Mcarthur Rossetti, MD;  [facility]: [facility];  Service: Orthopedics;  Laterality: Left;  Left Knee Arthroscopy, Debridement, partial synovectomy  . LUMBAR LAMINECTOMY    . LUMBAR PERCUTANEOUS PEDICLE SCREW 2 LEVEL N/A 01/22/2017   Procedure: Lumbar Percutaneous Pedicle Screw Placement Lumbar three-four, Lumbar four-five;  Surgeon: Erline Levine, MD;  Location: [place];  Service: Neurosurgery;  Laterality: N/A;  . ROBOTIC ASSISTED LAPAROSCOPIC SACROCOLPOPEXY N/A 03/07/2014   Procedure: ROBOTIC ASSISTED LAPAROSCOPIC SACROCOLPOPEXY With Carrolyn Meiers;  Surgeon: Princess Bruins, MD;  Location: [facility];  Service: Gynecology;  Laterality: N/A;  . ROBOTIC ASSISTED SUPRACERVICAL HYSTERECTOMY WITH BILATERAL SALPINGO OOPHERECTOMY N/A 03/07/2014   Procedure: ROBOTIC ASSISTED SUPRACERVICAL HYSTERECTOMY WITH BILATERAL SALPINGO OOPHORECTOMY;  Surgeon: Princess Bruins, MD;  Location: [facility];  Service: Gynecology;  Laterality: N/A;  4 hrs.  Marland Kitchen TOTAL KNEE ARTHROPLASTY Left 12/30/2012   Procedure: TOTAL KNEE ARTHROPLASTY;  Surgeon: Mcarthur Rossetti, MD;  [facility];  Service: Orthopedics;  Laterality: Left;  Left Total Knee Arthroplasty  . TUBAL LIGATION    . WISDOM TOOTH EXTRACTION      Family History  Problem Relation Age of Onset  . Cancer Mother  breast and lung  . Stroke Father   . Heart attack Father   . Diabetes Sister   . Emphysema Maternal Aunt   . Colon cancer Neg Hx     Social History   Social History  . Marital status: Married    Spouse name: N/A  . Number of children: N/A  . Years of education: N/A   Social History Main Topics  . Smoking status: Never Smoker  . Smokeless tobacco: Never Used  . Alcohol use No  . Drug use: No  . Sexual activity: Yes    Birth control/  protection: Post-menopausal   Other Topics Concern  . None   Social History Narrative  . None     Current Outpatient Prescriptions:  .  alendronate (FOSAMAX) 70 MG tablet, Take 1 tablet (70 mg total) by mouth every 7 (seven) days. Take with a full glass of water on an empty stomach., Disp: 4 tablet, Rfl: 11 .  ARMOUR THYROID 30 MG tablet, take 1 tablet by mouth once daily, Disp: 90 tablet, Rfl: 0 .  Bilberry, Vaccinium myrtillus, (BILBERRY PO), Take 1,200 mg elemental calcium/kg/hr by mouth daily., Disp: , Rfl:  .  Biotin 3 MG TABS, Take 1 tablet by mouth daily., Disp: , Rfl:  .  Calcium Carb-Cholecalciferol (CALCIUM 500 + D3 PO), Take 1 tablet by mouth 2 (two) times daily., Disp: , Rfl:  .  Cholecalciferol 5000 UNITS capsule, Take 5,000 Units by mouth daily., Disp: , Rfl:  .  Cinnamon 500 MG TABS, Take 1 tablet by mouth daily., Disp: , Rfl:  .  colchicine (COLCRYS) 0.6 MG tablet, Take 1 tablet (0.6 mg total) by mouth 2 (two) times daily as needed., Disp: 60 tablet, Rfl: 3 .  Cyanocobalamin (VITAMIN B-12 PO), Take 5,000 mcg by mouth daily., Disp: , Rfl:  .  FLUoxetine (PROZAC) 20 MG tablet, take 1 tablet by mouth once daily, Disp: 30 tablet, Rfl: 6 .  gabapentin (NEURONTIN) 300 MG capsule, Take 300 mg by mouth 3 (three) times daily., Disp: , Rfl:  .  HYDROcodone-acetaminophen (NORCO/VICODIN) 5-325 MG tablet, Take 1 tablet by mouth every 4 (four) hours as needed for moderate pain., Disp: , Rfl:  .  lidocaine (LIDODERM) 5 %, Place 1 patch onto the skin daily as needed. Remove & Discard patch within 12 hours or as directed by MD, Disp: , Rfl:  .  methocarbamol (ROBAXIN) 500 MG tablet, Take 1 tablet (500 mg total) by mouth every 6 (six) hours as needed for muscle spasms., Disp: 60 tablet, Rfl: 1 .  Multiple Vitamins-Minerals (AIRBORNE) CHEW, Chew 1 tablet by mouth daily., Disp: , Rfl:  .  omeprazole (PRILOSEC) 40 MG capsule, take 1 capsule by mouth once daily, Disp: 90 capsule, Rfl: 0 .   TURMERIC CURCUMIN PO, Take 1,000 mg by mouth daily., Disp: , Rfl:  .  vitamin E 400 UNIT capsule, Take 1 tablet by mouth daily., Disp: , Rfl:  .  clindamycin (CLEOCIN) 300 MG capsule, Take 1 capsule (300 mg total) by mouth 3 (three) times daily., Disp: 15 capsule, Rfl: 0 .  doxycycline (VIBRA-TABS) 100 MG tablet, Take 1 tablet (100 mg total) by mouth 2 (two) times daily., Disp: 10 tablet, Rfl: 0 .  zolpidem (AMBIEN) 10 MG tablet, take 1 tablet by mouth at bedtime if needed for sleep (Patient not taking: Reported on 04/23/2017), Disp: 30 tablet, Rfl: 2  EXAM:  Vitals:   04/23/17 1617  BP: 110/78  Pulse: 72  Temp: 97.9  F (36.6 C)    Body mass index is 24.92 kg/m.  GENERAL: vitals reviewed and listed above, alert, oriented, appears well hydrated and in no acute distress  HEENT: atraumatic, conjunttiva clear, no obvious abnormalities on inspection of external nose and ears  NECK: no obvious masses on inspection  LUNGS: clear to auscultation bilaterally, no wheezes, rales or rhonchi, good air movement  CV: HRRR, no peripheral edema  SKIN: superficial appearing skin tear R shin with 8x4 cm area surrounding erythema and warmth  MS: moves all extremities without noticeable abnormality  PSYCH: pleasant and cooperative, no obvious depression or anxiety  ASSESSMENT AND PLAN:  Discussed the following assessment and plan:  Cat scratch - Plan: doxycycline (VIBRA-TABS) 100 MG tablet, clindamycin (CLEOCIN) 300 MG capsule, Tdap vaccine greater than or equal to 7yo IM  Cellulitis of right lower extremity - Plan: doxycycline (VIBRA-TABS) 100 MG tablet, clindamycin (CLEOCIN) 300 MG capsule  -tetanus booster -wound care recs -abx for mild cellulitis for coverage cat bite/scratch common organisms and also chosen given her hx of medication allergies -area marked, return and emergency precuations -follow up in 3 days -Patient advised to return or notify a doctor immediately if symptoms worsen  or persist or new concerns arise. Discussed options avaible if worsening over the weekend - [facility] and ER.  Patient Instructions  BEFORE YOU LEAVE: -tetanus booster -follow up: Monday or Tuesday  Start the antibiotics and take as instructed.  I hope you are feeling better soon! Seek care immediately if worsening, new concerns or you are not improving with treatment.      Colin Benton R., DO

## 2017-04-23 NOTE — Telephone Encounter (Signed)
Really needs to be seen.

## 2017-04-23 NOTE — Telephone Encounter (Signed)
Spoke with pt and she is scheduled with Dr. Maudie Mercury this afternoon.

## 2017-04-23 NOTE — Telephone Encounter (Signed)
LMTCB

## 2017-04-23 NOTE — Patient Instructions (Signed)
BEFORE YOU LEAVE: -tetanus booster -follow up: Monday or Tuesday  Start the antibiotics and take as instructed.  I hope you are feeling better soon! Seek care immediately if worsening, new concerns or you are not improving with treatment.

## 2017-04-27 ENCOUNTER — Ambulatory Visit (INDEPENDENT_AMBULATORY_CARE_PROVIDER_SITE_OTHER): Payer: PPO | Admitting: Family Medicine

## 2017-04-27 DIAGNOSIS — L03115 Cellulitis of right lower limb: Secondary | ICD-10-CM

## 2017-04-27 DIAGNOSIS — W5503XA Scratched by cat, initial encounter: Secondary | ICD-10-CM

## 2017-04-27 MED ORDER — DOXYCYCLINE HYCLATE 100 MG PO TABS: 100.0000 mg | ORAL_TABLET | Freq: Two times a day (BID) | ORAL | 0 refills | Status: DC

## 2017-04-27 NOTE — Patient Instructions (Signed)
Elevate leg frequently Use some topical heat- heating pad or warm soak Follow up for any fever or increased redness.

## 2017-04-27 NOTE — Progress Notes (Signed)
Subjective:     Patient ID: Julie [name], female   DOB: 1940-12-25, 76 y.o.   MRN: [id number]  HPI Patient here for follow-up cellulitis left leg from scratch from her own cat. She was placed on doxycycline and clindamycin. She did have one episode of vomiting Sunday and only took one dose of her antibiotics that day. She has not noted much improvement but certainly no worsening. No fevers or chills. Recent tetanus booster received  Past Medical History:  Diagnosis Date  . Arthritis    lower back shoulder  . Arthropathy, unspecified, site unspecified   . Cancer ([place])    skin -spots on face removed  . Contact dermatitis and other eczema, due to unspecified cause    History  . GERD (gastroesophageal reflux disease)    history - no current prob - no med  . Hypertension    was taking lisinopril but had an allergic reaction to medicaion  . 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 Other bursitis disorders    history  . Personal history of colonic polyps   . PONV (postoperative nausea and vomiting)   . Pure hypercholesterolemia   . SVD (spontaneous vaginal delivery)    x 1  . Unspecified disorder of thyroid   . Unspecified hypothyroidism    Past Surgical History:  Procedure Laterality Date  . ANTERIOR LAT LUMBAR FUSION Left 01/22/2017   Procedure: Left Lumbar three-four, Lumbar four-five  Anterior lateral lumbar interbody fusion;  Surgeon: Erline Levine, MD;  Location: [facility];  Service: Neurosurgery;  Laterality: Left;  . BACK SURGERY  2002   L4/L5  . BLADDER SUSPENSION N/A 03/07/2014   Procedure: TRANSVAGINAL TAPE (TVT) PROCEDURE WITH CYSTO;  Surgeon: Princess Bruins, MD;  Location: [facility];  Service: Gynecology;  Laterality: N/A;  vaginal   . blephorplasty      bilateral   . BREAST SURGERY     x 2 breast implants, later removed  . COLONOSCOPY    . CYSTOCELE REPAIR N/A 03/07/2014   Procedure: ANTERIOR REPAIR (CYSTOCELE);  Surgeon:  Princess Bruins, MD;  Location: [facility];  Service: Gynecology;  Laterality: N/A;  . devaited septum surgery  1975  . EYE SURGERY     bilateral cataract surgery  . KNEE ARTHROSCOPY  10/21/2012   right - Procedure: ARTHROSCOPY KNEE;  Surgeon: Mcarthur Rossetti, MD;  [facility];  Service: Orthopedics;  Laterality: Left;  Left Knee Arthroscopy, Debridement, partial synovectomy  . LUMBAR LAMINECTOMY    . LUMBAR PERCUTANEOUS PEDICLE SCREW 2 LEVEL N/A 01/22/2017   Procedure: Lumbar Percutaneous Pedicle Screw Placement Lumbar three-four, Lumbar four-five;  Surgeon: Erline Levine, MD;  Location: [facility];  Service: Neurosurgery;  Laterality: N/A;  . ROBOTIC ASSISTED LAPAROSCOPIC SACROCOLPOPEXY N/A 03/07/2014   Procedure: ROBOTIC ASSISTED LAPAROSCOPIC SACROCOLPOPEXY With Carrolyn Meiers;  Surgeon: Princess Bruins, MD;  Location: [facility];  Service: Gynecology;  Laterality: N/A;  . ROBOTIC ASSISTED SUPRACERVICAL HYSTERECTOMY WITH BILATERAL SALPINGO OOPHERECTOMY N/A 03/07/2014   Procedure: ROBOTIC ASSISTED SUPRACERVICAL HYSTERECTOMY WITH BILATERAL SALPINGO OOPHORECTOMY;  Surgeon: Princess Bruins, MD;  Location: [facility];  Service: Gynecology;  Laterality: N/A;  4 hrs.  Marland Kitchen TOTAL KNEE ARTHROPLASTY Left 12/30/2012   Procedure: TOTAL KNEE ARTHROPLASTY;  Surgeon: Mcarthur Rossetti, MD;  [facility];  Service: Orthopedics;  Laterality: Left;  Left Total Knee Arthroplasty  . TUBAL LIGATION    . WISDOM TOOTH EXTRACTION      reports  that she has never smoked. She has never used smokeless tobacco. She reports that she does not drink alcohol or use drugs. family history includes Cancer in her mother; Diabetes in her sister; Emphysema in her maternal aunt; Heart attack in her father; Stroke in her father. Allergies  Allergen Reactions  . Penicillins Anaphylaxis    REACTION: causes tongue to swell Has patient had a PCN reaction causing immediate rash, facial/tongue/throat swelling, SOB or lightheadedness with  hypotension: Yes swelling Has patient had a PCN reaction causing severe rash involving mucus membranes or skin necrosis:  Has patient had a PCN reaction that required hospitalization no Has patient had a PCN reaction occurring within the last 10 years: no If all of the above answers are "NO", then may proceed with Cephalosporin use.   . Adhesive [Tape] Other (See Comments)    Please use paper tape  . Codeine Nausea Only  . Lisinopril     REACTION: wheezing  . Other Other (See Comments)    Pt was having knee surgery and was given an epidural, which did not work.  [name] [Oxycodone-Acetaminophen] Nausea And Vomiting     Review of Systems  Constitutional: Negative for chills and fever.       Objective:   Physical Exam  Constitutional: She appears well-developed and well-nourished.  Skin:  Left leg examined. She has small eschar from recent cat scratch surrounded by area of erythema about 4 x 5 cm. Faint erythema. Minimally warm. Nontender. No drainage.       Assessment:     Cellulitis left leg from recent cat scratch-minimally improved and on broad spectrum coverage.    Plan:     -We extended her doxycycline another 5 days -Suggested she try some topical heat with heating pad or warm tub bath and elevate legs frequently and follow-up promptly for any fever, chills, or progressive redness  Eulas Post MD [facility] at [facility]  -

## 2017-04-28 DIAGNOSIS — M5412 Radiculopathy, cervical region: Secondary | ICD-10-CM | POA: Diagnosis not present

## 2017-04-28 DIAGNOSIS — M542 Cervicalgia: Secondary | ICD-10-CM | POA: Diagnosis not present

## 2017-05-03 ENCOUNTER — Telehealth: Payer: Self-pay | Admitting: Family Medicine

## 2017-05-03 NOTE — Telephone Encounter (Signed)
PT WAS seen on 04-27-17 for cat bite and has one abx pill left. Pt states the bite looks better however she would like another round abx send to [facility]

## 2017-05-03 NOTE — Telephone Encounter (Signed)
Patient states the area is still red.  Would you like for her to come in for an office visit?

## 2017-05-03 NOTE — Telephone Encounter (Signed)
Would not recommend further antibiotics if this is looking better. Prolonged antibiotics can increase risk of C. difficile colitis

## 2017-05-03 NOTE — Telephone Encounter (Signed)
Appointment made

## 2017-05-03 NOTE — Telephone Encounter (Signed)
If still red, we should re-check.

## 2017-05-04 ENCOUNTER — Ambulatory Visit (INDEPENDENT_AMBULATORY_CARE_PROVIDER_SITE_OTHER): Payer: PPO | Admitting: Family Medicine

## 2017-05-04 VITALS — BP 120/78 | HR 80 | Temp 97.9°F | Wt 127.1 lb

## 2017-05-04 DIAGNOSIS — S81801D Unspecified open wound, right lower leg, subsequent encounter: Secondary | ICD-10-CM

## 2017-05-04 MED ORDER — DOXYCYCLINE HYCLATE 100 MG PO CAPS: 100.0000 mg | ORAL_CAPSULE | Freq: Two times a day (BID) | ORAL | 0 refills | Status: DC

## 2017-05-04 MED ORDER — FLUCONAZOLE 150 MG PO TABS: 150.0000 mg | ORAL_TABLET | Freq: Once | ORAL | 0 refills | Status: AC

## 2017-05-04 NOTE — Patient Instructions (Signed)
Try some vaseline to wound left leg Avoid topical antibiotics.   Follow up for any increased redness, swelling, or drainage

## 2017-05-04 NOTE — Progress Notes (Signed)
Subjective:     Patient ID: Julie [name], female   DOB: 03-16-1941, 76 y.o.   MRN: [id number]  HPI Patient seen for slow healing wound right lower leg. About 3 weeks ago injury by cat scratch. She was placed on doxycycline and clindamycin and just finished up course yesterday. She called back asking for possible extension of antibiotics because some mild persistent redness. She's had no drainage. No fevers or chills. Overall improved but slow to heal. She has had some possible yeast vaginitis symptoms with pruritus and discharge. No diarrhea.  Past Medical History:  Diagnosis Date  . Arthritis    lower back shoulder  . Arthropathy, unspecified, site unspecified   . Cancer ([place])    skin -spots on face removed  . Contact dermatitis and other eczema, due to unspecified cause    History  . GERD (gastroesophageal reflux disease)    history - no current prob - no med  . Hypertension    was taking lisinopril but had an allergic reaction to medicaion  . 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 Other bursitis disorders    history  . Personal history of colonic polyps   . PONV (postoperative nausea and vomiting)   . Pure hypercholesterolemia   . SVD (spontaneous vaginal delivery)    x 1  . Unspecified disorder of thyroid   . Unspecified hypothyroidism    Past Surgical History:  Procedure Laterality Date  . ANTERIOR LAT LUMBAR FUSION Left 01/22/2017   Procedure: Left Lumbar three-four, Lumbar four-five  Anterior lateral lumbar interbody fusion;  Surgeon: Erline Levine, MD;  Location: [facility];  Service: Neurosurgery;  Laterality: Left;  . BACK SURGERY  2002   L4/L5  . BLADDER SUSPENSION N/A 03/07/2014   Procedure: TRANSVAGINAL TAPE (TVT) PROCEDURE WITH CYSTO;  Surgeon: Princess Bruins, MD;  Location: [facility];  Service: Gynecology;  Laterality: N/A;  vaginal   . blephorplasty      bilateral   . BREAST SURGERY     x 2 breast implants, later  removed  . COLONOSCOPY    . CYSTOCELE REPAIR N/A 03/07/2014   Procedure: ANTERIOR REPAIR (CYSTOCELE);  Surgeon: Princess Bruins, MD;  Location: [facility];  Service: Gynecology;  Laterality: N/A;  . devaited septum surgery  1975  . EYE SURGERY     bilateral cataract surgery  . KNEE ARTHROSCOPY  10/21/2012   right - Procedure: ARTHROSCOPY KNEE;  Surgeon: Mcarthur Rossetti, MD;  [facility];  Service: Orthopedics;  Laterality: Left;  Left Knee Arthroscopy, Debridement, partial synovectomy  . LUMBAR LAMINECTOMY    . LUMBAR PERCUTANEOUS PEDICLE SCREW 2 LEVEL N/A 01/22/2017   Procedure: Lumbar Percutaneous Pedicle Screw Placement Lumbar three-four, Lumbar four-five;  Surgeon: Erline Levine, MD;  Location: [place];  Service: Neurosurgery;  Laterality: N/A;  . ROBOTIC ASSISTED LAPAROSCOPIC SACROCOLPOPEXY N/A 03/07/2014   Procedure: ROBOTIC ASSISTED LAPAROSCOPIC SACROCOLPOPEXY With Carrolyn Meiers;  Surgeon: Princess Bruins, MD;  Location: [facility];  Service: Gynecology;  Laterality: N/A;  . ROBOTIC ASSISTED SUPRACERVICAL HYSTERECTOMY WITH BILATERAL SALPINGO OOPHERECTOMY N/A 03/07/2014   Procedure: ROBOTIC ASSISTED SUPRACERVICAL HYSTERECTOMY WITH BILATERAL SALPINGO OOPHORECTOMY;  Surgeon: Princess Bruins, MD;  Location: [facility];  Service: Gynecology;  Laterality: N/A;  4 hrs.  Marland Kitchen TOTAL KNEE ARTHROPLASTY Left 12/30/2012   Procedure: TOTAL KNEE ARTHROPLASTY;  Surgeon: Mcarthur Rossetti, MD;  [facility];  Service: Orthopedics;  Laterality: Left;  Left Total Knee Arthroplasty  .  TUBAL LIGATION    . WISDOM TOOTH EXTRACTION      reports that she has never smoked. She has never used smokeless tobacco. She reports that she does not drink alcohol or use drugs. family history includes Cancer in her mother; Diabetes in her sister; Emphysema in her maternal aunt; Heart attack in her father; Stroke in her father. Allergies  Allergen Reactions  . Penicillins Anaphylaxis    REACTION: causes tongue to swell Has  patient had a PCN reaction causing immediate rash, facial/tongue/throat swelling, SOB or lightheadedness with hypotension: Yes swelling Has patient had a PCN reaction causing severe rash involving mucus membranes or skin necrosis:  Has patient had a PCN reaction that required hospitalization no Has patient had a PCN reaction occurring within the last 10 years: no If all of the above answers are "NO", then may proceed with Cephalosporin use.   . Adhesive [Tape] Other (See Comments)    Please use paper tape  . Codeine Nausea Only  . Lisinopril     REACTION: wheezing  . Other Other (See Comments)    Pt was having knee surgery and was given an epidural, which did not work.  [name] [Oxycodone-Acetaminophen] Nausea And Vomiting     Review of Systems  Constitutional: Negative for chills and fever.       Objective:   Physical Exam  Constitutional: She appears well-developed and well-nourished.  Cardiovascular: Normal rate and regular rhythm.   Skin:  Right anterior leg reveals wound which is not much change from last week. She has fair he small surrounding area erythema which is only about a 1 cm zone area she has small eschar. No warmth. No drainage. No necrosis.       Assessment:     Slow healing wound right anterior leg from cat scratch injury. Does not have any significant cellulitis changes at this time    Plan:     -Close observation and follow-up promptly for any fever, progressive erythema, drainage, or pain or swelling -Fluconazole 150 mg 1 dose for probable yeast vaginitis symptoms -Avoid topical antibiotics which could slow wound healing. She'll try some Vaseline -Touch base if wound not further healed in 2-3 weeks  Eulas Post MD [facility] at [facility]

## 2017-05-31 NOTE — Progress Notes (Addendum)
Subjective:   Julie [name] is a 76 y.o. female who presents for Medicare Annual (Subsequent) preventive examination.  Works 3 days/week, [employer] manager.   Physical assessment deferred to PCP.  Sleep patterns: Sleeps 5 hours, does not feels rested. Has taken Ambien in the past, stopped d/t eating at night. Up to void x 2.  Home Safety/Smoke Alarms: Feels safe in home. Smoke alarms in place.  Living environment; residence and Firearm Safety: Lives with husband in 1 story home.  Seat Belt Safety/Bike Helmet: Wears seat belt.   Counseling:   Eye Exam- Last exam > 1 year. Dr. Norlene Duel exam > 1 year. Full dentures top, partials bottom.   Female:   [id number]      Mammo-03/05/2015, negative. Ordered today.       Dexa scan-Pt reports 12/2016, Osteoporosis. [facility]. Will call for report.   CCS-Colonoscopy 06/10/2009, normal. No recall.         Objective:     Vitals: BP 118/76 (BP Location: Left Arm, Patient Position: Sitting, Cuff Size: Normal)   Pulse 64   Ht 4\' 11"  (1.499 m)   Wt 127 lb 11.2 oz (57.9 kg)   SpO2 95%   BMI 25.79 kg/m   Body mass index is 25.79 kg/m.    Tobacco History  Smoking Status  . Never Smoker  Smokeless Tobacco  . Never Used     Counseling given: Not Answered   Past Medical History:  Diagnosis Date  . Arthritis    lower back shoulder  . Arthropathy, unspecified, site unspecified   . Cancer ([place])    skin -spots on face removed  . Contact dermatitis and other eczema, due to unspecified cause    History  . GERD (gastroesophageal reflux disease)    history - no current prob - no med  . Hypertension    was taking lisinopril but had an allergic reaction to medicaion  . 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 Other bursitis disorders    history  . Personal history of colonic polyps   . PONV (postoperative nausea and vomiting)   . Pure hypercholesterolemia   . SVD (spontaneous  vaginal delivery)    x 1  . Unspecified disorder of thyroid   . Unspecified hypothyroidism    Past Surgical History:  Procedure Laterality Date  . ANTERIOR LAT LUMBAR FUSION Left 01/22/2017   Procedure: Left Lumbar three-four, Lumbar four-five  Anterior lateral lumbar interbody fusion;  Surgeon: Erline Levine, MD;  Location: [facility];  Service: Neurosurgery;  Laterality: Left;  . BACK SURGERY  2002   L4/L5  . BLADDER SUSPENSION N/A 03/07/2014   Procedure: TRANSVAGINAL TAPE (TVT) PROCEDURE WITH CYSTO;  Surgeon: Princess Bruins, MD;  Location: [facility];  Service: Gynecology;  Laterality: N/A;  vaginal   . blephorplasty      bilateral   . BREAST SURGERY     x 2 breast implants, later removed  . COLONOSCOPY    . CYSTOCELE REPAIR N/A 03/07/2014   Procedure: ANTERIOR REPAIR (CYSTOCELE);  Surgeon: Princess Bruins, MD;  Location: [facility];  Service: Gynecology;  Laterality: N/A;  . devaited septum surgery  1975  . EYE SURGERY     bilateral cataract surgery  . KNEE ARTHROSCOPY  10/21/2012   right - Procedure: ARTHROSCOPY KNEE;  Surgeon: Mcarthur Rossetti, MD;  Location: [facility];  Service: Orthopedics;  Laterality: Left;  Left Knee Arthroscopy, Debridement, partial synovectomy  .  LUMBAR LAMINECTOMY    . LUMBAR PERCUTANEOUS PEDICLE SCREW 2 LEVEL N/A 01/22/2017   Procedure: Lumbar Percutaneous Pedicle Screw Placement Lumbar three-four, Lumbar four-five;  Surgeon: Erline Levine, MD;  Location: [facility];  Service: Neurosurgery;  Laterality: N/A;  . ROBOTIC ASSISTED LAPAROSCOPIC SACROCOLPOPEXY N/A 03/07/2014   Procedure: ROBOTIC ASSISTED LAPAROSCOPIC SACROCOLPOPEXY With Carrolyn Meiers;  Surgeon: Princess Bruins, MD;  Location: [facility];  Service: Gynecology;  Laterality: N/A;  . ROBOTIC ASSISTED SUPRACERVICAL HYSTERECTOMY WITH BILATERAL SALPINGO OOPHERECTOMY N/A 03/07/2014   Procedure: ROBOTIC ASSISTED SUPRACERVICAL HYSTERECTOMY WITH BILATERAL SALPINGO OOPHORECTOMY;  Surgeon: Princess Bruins, MD;  Location: [facility];   Service: Gynecology;  Laterality: N/A;  4 hrs.  Marland Kitchen TOTAL KNEE ARTHROPLASTY Left 12/30/2012   Procedure: TOTAL KNEE ARTHROPLASTY;  Surgeon: Mcarthur Rossetti, MD;  Location: [facility];  Service: Orthopedics;  Laterality: Left;  Left Total Knee Arthroplasty  . TUBAL LIGATION    . WISDOM TOOTH EXTRACTION     Family History  Problem Relation Age of Onset  . Cancer Mother        breast and lung  . Stroke Father   . Heart attack Father   . Diabetes Sister   . Emphysema Maternal Aunt   . Colon cancer Neg Hx    History  Sexual Activity  . Sexual activity: Yes  . Birth control/ protection: Post-menopausal    Outpatient Encounter Prescriptions as of 06/01/2017  Medication Sig  . alendronate (FOSAMAX) 70 MG tablet Take 1 tablet (70 mg total) by mouth every 7 (seven) days. Take with a full glass of water on an empty stomach.  Francia Greaves THYROID 30 MG tablet take 1 tablet by mouth once daily  . Bilberry, Vaccinium myrtillus, (BILBERRY PO) Take 1,200 mg elemental calcium/kg/hr by mouth daily.  . Biotin 3 MG TABS Take 1 tablet by mouth daily.  . Calcium Carb-Cholecalciferol (CALCIUM 500 + D3 PO) Take 1 tablet by mouth 2 (two) times daily.  . Cholecalciferol 5000 UNITS capsule Take 5,000 Units by mouth daily.  . Cinnamon 500 MG TABS Take 1 tablet by mouth daily.  . colchicine (COLCRYS) 0.6 MG tablet Take 1 tablet (0.6 mg total) by mouth 2 (two) times daily as needed.  . Cyanocobalamin (VITAMIN B-12 PO) Take 5,000 mcg by mouth daily.  Marland Kitchen FLUoxetine (PROZAC) 20 MG tablet take 1 tablet by mouth once daily  . HYDROcodone-acetaminophen (NORCO/VICODIN) 5-325 MG tablet Take 1 tablet by mouth every 4 (four) hours as needed for moderate pain.  . methocarbamol (ROBAXIN) 500 MG tablet Take 1 tablet (500 mg total) by mouth every 6 (six) hours as needed for muscle spasms.  . Multiple Vitamins-Minerals (AIRBORNE) CHEW Chew 1 tablet by mouth daily.  Marland Kitchen omeprazole (PRILOSEC) 40 MG capsule take 1 capsule by mouth  once daily  . TURMERIC CURCUMIN PO Take 1,000 mg by mouth daily.  . vitamin E 400 UNIT capsule Take 1 tablet by mouth daily.  Marland Kitchen lidocaine (LIDODERM) 5 % Place 1 patch onto the skin daily as needed. Remove & Discard patch within 12 hours or as directed by MD  . [DISCONTINUED] doxycycline (VIBRA-TABS) 100 MG tablet Take 1 tablet (100 mg total) by mouth 2 (two) times daily.  . [DISCONTINUED] doxycycline (VIBRAMYCIN) 100 MG capsule Take 1 capsule (100 mg total) by mouth 2 (two) times daily.   No facility-administered encounter medications on file as of 06/01/2017.     Activities of Daily Living In your present state of health, do you have any difficulty performing the following  activities: 06/01/2017 01/22/2017  Hearing? N N  Vision? N N  Difficulty concentrating or making decisions? N N  Walking or climbing stairs? Y Y  Comment climbing steps bother right leg -  Dressing or bathing? N Y  Doing errands, shopping? N N  Preparing Food and eating ? N -  Using the Toilet? N -  In the past six months, have you accidently leaked urine? N -  Do you have problems with loss of bowel control? N -  Managing your Medications? N -  Managing your Finances? N -  Housekeeping or managing your Housekeeping? N -  Some recent data might be hidden    Patient Care Team: Eulas Post, MD as PCP - General (Family Medicine) Erline Levine, MD as Consulting Physician (Neurosurgery) Dermatology, [facility]    Assessment:     Physical assessment deferred to PCP.  Exercise Activities and Dietary recommendations Current Exercise Habits: The patient does not participate in regular exercise at present [employer], gardening. Works 3 days/week), Exercise limited by: orthopedic condition(s)   Diet (meal preparation, eat out, water intake, caffeinated beverages, dairy products, fruits and vegetables): Fruit smoothie and water.   Breakfast: yogurt Lunch: sandwich and fruit Dinner: protein and vegetables.        Discussed heart healthy diet and ways to control snacking at night.   Goals      Patient Stated   . Patient states (pt-stated)          Control snacking at night.       Fall Risk Fall Risk  06/01/2017 02/24/2016 02/11/2015 10/30/2013  Falls in the past year? Yes Yes No No  Number falls in past yr: 2 or more 1 - -  Injury with Fall? Yes Yes - -  Risk for fall due to : Medication side effect;Impaired balance/gait - - -   Depression Screen PHQ 2/9 Scores 06/01/2017 01/12/2017 02/24/2016 02/11/2015  PHQ - 2 Score 0 0 0 0  PHQ- 9 Score - 3 - -     Cognitive Function       Ad8 score reviewed for issues:  Issues making decisions:  no  Less interest in hobbies / activities: no  Repeats questions, stories (family complaining): no  Trouble using ordinary gadgets (microwave, computer, phone): no  Forgets the month or year:  no  Mismanaging finances: no  Remembering appts: no  Daily problems with thinking and/or memory: n0 Ad8 score is=0     Immunization History  Administered Date(s) Administered  . Pneumococcal Conjugate-13 02/24/2016  . Pneumococcal Polysaccharide-23 02/11/2015  . Tdap 04/23/2017   Shingrix Rx sent to pharmacy.   Screening Tests Health Maintenance  Topic Date Due  . FOOT EXAM  10/22/1951  . URINE MICROALBUMIN  10/22/1951  . OPHTHALMOLOGY EXAM  06/25/2010  . HEMOGLOBIN A1C  04/30/2014  . INFLUENZA VACCINE  07/20/2017 (Originally 06/02/2017)  . TETANUS/TDAP  04/24/2027  . DEXA SCAN  Completed  . PNA vac Low Risk Adult  Completed      Plan:    Schedule mammogram.   Schedule eye exam.   Bring a copy of your advance directives to your next office visit.   Shingrix vaccine at pharmacy   I have personally reviewed and noted the following in the patient's chart:   . Medical and social history . Use of alcohol, tobacco or illicit drugs  . Current medications and supplements . Functional ability and status . Nutritional  status . Physical activity . Advanced directives . List  of other physicians . Hospitalizations, surgeries, and ER visits in previous 12 months . Vitals . Screenings to include cognitive, depression, and falls . Referrals and appointments  In addition, I have reviewed and discussed with patient certain preventive protocols, quality metrics, and best practice recommendations. A written personalized care plan for preventive services as well as general preventive health recommendations were provided to patient.     Gerilyn Nestle, RN  06/01/2017   PCP Notes: -Shingrix Rx sent to pharmacy -Right lower extremity continues to slowly heal. Remains red around wound.    Above reviewed.  Agree with assessment and recommendations.  Eulas Post MD [facility]

## 2017-06-01 ENCOUNTER — Ambulatory Visit (INDEPENDENT_AMBULATORY_CARE_PROVIDER_SITE_OTHER): Payer: PPO

## 2017-06-01 VITALS — BP 118/76 | HR 64 | Ht 59.0 in | Wt 127.7 lb

## 2017-06-01 DIAGNOSIS — Z1231 Encounter for screening mammogram for malignant neoplasm of breast: Secondary | ICD-10-CM

## 2017-06-01 DIAGNOSIS — Z23 Encounter for immunization: Secondary | ICD-10-CM

## 2017-06-01 DIAGNOSIS — Z0000 Encounter for general adult medical examination without abnormal findings: Secondary | ICD-10-CM | POA: Diagnosis not present

## 2017-06-01 DIAGNOSIS — Z1239 Encounter for other screening for malignant neoplasm of breast: Secondary | ICD-10-CM

## 2017-06-01 MED ORDER — ZOSTER VAC RECOMB ADJUVANTED 50 MCG/0.5ML IM SUSR: 0.5000 mL | Freq: Once | INTRAMUSCULAR | 1 refills | Status: AC

## 2017-06-01 NOTE — Patient Instructions (Addendum)
Schedule mammogram.   Schedule eye exam.   Bring a copy of your advance directives to your next office visit.   Shingrix vaccine at pharmacy    Fall Prevention in the Home Falls can cause injuries. They can happen to people of all ages. There are many things you can do to make your home safe and to help prevent falls. What can I do on the outside of my home?  Regularly fix the edges of walkways and driveways and fix any cracks.  Remove anything that might make you trip as you walk through a door, such as a raised step or threshold.  Trim any bushes or trees on the path to your home.  Use bright outdoor lighting.  Clear any walking paths of anything that might make someone trip, such as rocks or tools.  Regularly check to see if handrails are loose or broken. Make sure that both sides of any steps have handrails.  Any raised decks and porches should have guardrails on the edges.  Have any leaves, snow, or ice cleared regularly.  Use sand or salt on walking paths during winter.  Clean up any spills in your garage right away. This includes oil or grease spills. What can I do in the bathroom?  Use night lights.  Install grab bars by the toilet and in the tub and shower. Do not use towel bars as grab bars.  Use non-skid mats or decals in the tub or shower.  If you need to sit down in the shower, use a plastic, non-slip stool.  Keep the floor dry. Clean up any water that spills on the floor as soon as it happens.  Remove soap buildup in the tub or shower regularly.  Attach bath mats securely with double-sided non-slip rug tape.  Do not have throw rugs and other things on the floor that can make you trip. What can I do in the bedroom?  Use night lights.  Make sure that you have a light by your bed that is easy to reach.  Do not use any sheets or blankets that are too big for your bed. They should not hang down onto the floor.  Have a firm chair that has side arms.  You can use this for support while you get dressed.  Do not have throw rugs and other things on the floor that can make you trip. What can I do in the kitchen?  Clean up any spills right away.  Avoid walking on wet floors.  Keep items that you use a lot in easy-to-reach places.  If you need to reach something above you, use a strong step stool that has a grab bar.  Keep electrical cords out of the way.  Do not use floor polish or wax that makes floors slippery. If you must use wax, use non-skid floor wax.  Do not have throw rugs and other things on the floor that can make you trip. What can I do with my stairs?  Do not leave any items on the stairs.  Make sure that there are handrails on both sides of the stairs and use them. Fix handrails that are broken or loose. Make sure that handrails are as long as the stairways.  Check any carpeting to make sure that it is firmly attached to the stairs. Fix any carpet that is loose or worn.  Avoid having throw rugs at the top or bottom of the stairs. If you do have throw rugs, attach them  to the floor with carpet tape.  Make sure that you have a light switch at the top of the stairs and the bottom of the stairs. If you do not have them, ask someone to add them for you. What else can I do to help prevent falls?  Wear shoes that: ? Do not have high heels. ? Have rubber bottoms. ? Are comfortable and fit you well. ? Are closed at the toe. Do not wear sandals.  If you use a stepladder: ? Make sure that it is fully opened. Do not climb a closed stepladder. ? Make sure that both sides of the stepladder are locked into place. ? Ask someone to hold it for you, if possible.  Clearly mark and make sure that you can see: ? Any grab bars or handrails. ? First and last steps. ? Where the edge of each step is.  Use tools that help you move around (mobility aids) if they are needed. These  include: ? Canes. ? Walkers. ? Scooters. ? Crutches.  Turn on the lights when you go into a dark area. Replace any light bulbs as soon as they burn out.  Set up your furniture so you have a clear path. Avoid moving your furniture around.  If any of your floors are uneven, fix them.  If there are any pets around you, be aware of where they are.  Review your medicines with your doctor. Some medicines can make you feel dizzy. This can increase your chance of falling. Ask your doctor what other things that you can do to help prevent falls. This information is not intended to replace advice given to you by your health care provider. Make sure you discuss any questions you have with your health care provider. Document Released: 08/15/2009 Document Revised: 03/26/2016 Document Reviewed: 11/23/2014 Elsevier Interactive Patient Education  2018 [place] Maintenance, Female Adopting a healthy lifestyle and getting preventive care can go a long way to promote health and wellness. Talk with your health care provider about what schedule of regular examinations is right for you. This is a good chance for you to check in with your provider about disease prevention and staying healthy. In between checkups, there are plenty of things you can do on your own. Experts have done a lot of research about which lifestyle changes and preventive measures are most likely to keep you healthy. Ask your health care provider for more information. Weight and diet Eat a healthy diet  Be sure to include plenty of vegetables, fruits, low-fat dairy products, and lean protein.  Do not eat a lot of foods high in solid fats, added sugars, or salt.  Get regular exercise. This is one of the most important things you can do for your health. ? Most adults should exercise for at least 150 minutes each week. The exercise should increase your heart rate and make you sweat (moderate-intensity exercise). ? Most adults  should also do strengthening exercises at least twice a week. This is in addition to the moderate-intensity exercise.  Maintain a healthy weight  Body mass index (BMI) is a measurement that can be used to identify possible weight problems. It estimates body fat based on height and weight. Your health care provider can help determine your BMI and help you achieve or maintain a healthy weight.  For females 57 years of age and older: ? A BMI below 18.5 is considered underweight. ? A BMI of 18.5 to 24.9 is normal. ? A  BMI of 25 to 29.9 is considered overweight. ? A BMI of 30 and above is considered obese.  Watch levels of cholesterol and blood lipids  You should start having your blood tested for lipids and cholesterol at 78 years of age, then have this test every 5 years.  You may need to have your cholesterol levels checked more often if: ? Your lipid or cholesterol levels are high. ? You are older than 78 years of age. ? You are at high risk for heart disease.  Cancer screening Lung Cancer  Lung cancer screening is recommended for adults 74-77 years old who are at high risk for lung cancer because of a history of smoking.  A yearly low-dose CT scan of the lungs is recommended for people who: ? Currently smoke. ? Have quit within the past 15 years. ? Have at least a 30-pack-year history of smoking. A pack year is smoking an average of one pack of cigarettes a day for 1 year.  Yearly screening should continue until it has been 15 years since you quit.  Yearly screening should stop if you develop a health problem that would prevent you from having lung cancer treatment.  Breast Cancer  Practice breast self-awareness. This means understanding how your breasts normally appear and feel.  It also means doing regular breast self-exams. Let your health care provider know about any changes, no matter how small.  If you are in your 20s or 30s, you should have a clinical breast exam (CBE)  by a health care provider every 1-3 years as part of a regular health exam.  If you are 58 or older, have a CBE every year. Also consider having a breast X-ray (mammogram) every year.  If you have a family history of breast cancer, talk to your health care provider about genetic screening.  If you are at high risk for breast cancer, talk to your health care provider about having an MRI and a mammogram every year.  Breast cancer gene (BRCA) assessment is recommended for women who have family members with BRCA-related cancers. BRCA-related cancers include: ? Breast. ? Ovarian. ? Tubal. ? Peritoneal cancers.  Results of the assessment will determine the need for genetic counseling and BRCA1 and BRCA2 testing.  Cervical Cancer Your health care provider may recommend that you be screened regularly for cancer of the pelvic organs (ovaries, uterus, and vagina). This screening involves a pelvic examination, including checking for microscopic changes to the surface of your cervix (Pap test). You may be encouraged to have this screening done every 3 years, beginning at age 32.  For women ages 56-65, health care providers may recommend pelvic exams and Pap testing every 3 years, or they may recommend the Pap and pelvic exam, combined with testing for human papilloma virus (HPV), every 5 years. Some types of HPV increase your risk of cervical cancer. Testing for HPV may also be done on women of any age with unclear Pap test results.  Other health care providers may not recommend any screening for nonpregnant women who are considered low risk for pelvic cancer and who do not have symptoms. Ask your health care provider if a screening pelvic exam is right for you.  If you have had past treatment for cervical cancer or a condition that could lead to cancer, you need Pap tests and screening for cancer for at least 20 years after your treatment. If Pap tests have been discontinued, your risk factors (such as  having a new  sexual partner) need to be reassessed to determine if screening should resume. Some women have medical problems that increase the chance of getting cervical cancer. In these cases, your health care provider may recommend more frequent screening and Pap tests.  Colorectal Cancer  This type of cancer can be detected and often prevented.  Routine colorectal cancer screening usually begins at 78 years of age and continues through 78 years of age.  Your health care provider may recommend screening at an earlier age if you have risk factors for colon cancer.  Your health care provider may also recommend using home test kits to check for hidden blood in the stool.  A small camera at the end of a tube can be used to examine your colon directly (sigmoidoscopy or colonoscopy). This is done to check for the earliest forms of colorectal cancer.  Routine screening usually begins at age 10.  Direct examination of the colon should be repeated every 5-10 years through 78 years of age. However, you may need to be screened more often if early forms of precancerous polyps or small growths are found.  Skin Cancer  Check your skin from head to toe regularly.  Tell your health care provider about any new moles or changes in moles, especially if there is a change in a mole's shape or color.  Also tell your health care provider if you have a mole that is larger than the size of a pencil eraser.  Always use sunscreen. Apply sunscreen liberally and repeatedly throughout the day.  Protect yourself by wearing long sleeves, pants, a wide-brimmed hat, and sunglasses whenever you are outside.  Heart disease, diabetes, and high blood pressure  High blood pressure causes heart disease and increases the risk of stroke. High blood pressure is more likely to develop in: ? People who have blood pressure in the high end of the normal range (130-139/85-89 mm Hg). ? People who are overweight or  obese. ? People who are African American.  If you are 21-40 years of age, have your blood pressure checked every 3-5 years. If you are 74 years of age or older, have your blood pressure checked every year. You should have your blood pressure measured twice-once when you are at a hospital or clinic, and once when you are not at a hospital or clinic. Record the average of the two measurements. To check your blood pressure when you are not at a hospital or clinic, you can use: ? An automated blood pressure machine at a pharmacy. ? A home blood pressure monitor.  If you are between 47 years and 23 years old, ask your health care provider if you should take aspirin to prevent strokes.  Have regular diabetes screenings. This involves taking a blood sample to check your fasting blood sugar level. ? If you are at a normal weight and have a low risk for diabetes, have this test once every three years after 78 years of age. ? If you are overweight and have a high risk for diabetes, consider being tested at a younger age or more often. Preventing infection Hepatitis B  If you have a higher risk for hepatitis B, you should be screened for this virus. You are considered at high risk for hepatitis B if: ? You were born in a country where hepatitis B is common. Ask your health care provider which countries are considered high risk. ? Your parents were born in a high-risk country, and you have not been immunized against  hepatitis B (hepatitis B vaccine). ? You have HIV or AIDS. ? You use needles to inject street drugs. ? You live with someone who has hepatitis B. ? You have had sex with someone who has hepatitis B. ? You get hemodialysis treatment. ? You take certain medicines for conditions, including cancer, organ transplantation, and autoimmune conditions.  Hepatitis C  Blood testing is recommended for: ? Everyone born from [birth year] through 1967. ? Anyone with known risk factors for hepatitis  C.  Sexually transmitted infections (STIs)  You should be screened for sexually transmitted infections (STIs) including gonorrhea and chlamydia if: ? You are sexually active and are younger than 78 years of age. ? You are older than 78 years of age and your health care provider tells you that you are at risk for this type of infection. ? Your sexual activity has changed since you were last screened and you are at an increased risk for chlamydia or gonorrhea. Ask your health care provider if you are at risk.  If you do not have HIV, but are at risk, it may be recommended that you take a prescription medicine daily to prevent HIV infection. This is called pre-exposure prophylaxis (PrEP). You are considered at risk if: ? You are sexually active and do not regularly use condoms or know the HIV status of your partner(s). ? You take drugs by injection. ? You are sexually active with a partner who has HIV.  Talk with your health care provider about whether you are at high risk of being infected with HIV. If you choose to begin PrEP, you should first be tested for HIV. You should then be tested every 3 months for as long as you are taking PrEP. Pregnancy  If you are premenopausal and you may become pregnant, ask your health care provider about preconception counseling.  If you may become pregnant, take 400 to 800 micrograms (mcg) of folic acid every day.  If you want to prevent pregnancy, talk to your health care provider about birth control (contraception). Osteoporosis and menopause  Osteoporosis is a disease in which the bones lose minerals and strength with aging. This can result in serious bone fractures. Your risk for osteoporosis can be identified using a bone density scan.  If you are 38 years of age or older, or if you are at risk for osteoporosis and fractures, ask your health care provider if you should be screened.  Ask your health care provider whether you should take a calcium or  vitamin D supplement to lower your risk for osteoporosis.  Menopause may have certain physical symptoms and risks.  Hormone replacement therapy may reduce some of these symptoms and risks. Talk to your health care provider about whether hormone replacement therapy is right for you. Follow these instructions at home:  Schedule regular health, dental, and eye exams.  Stay current with your immunizations.  Do not use any tobacco products including cigarettes, chewing tobacco, or electronic cigarettes.  If you are pregnant, do not drink alcohol.  If you are breastfeeding, limit how much and how often you drink alcohol.  Limit alcohol intake to no more than 1 drink per day for nonpregnant women. One drink equals 12 ounces of beer, 5 ounces of wine, or 1 ounces of hard liquor.  Do not use street drugs.  Do not share needles.  Ask your health care provider for help if you need support or information about quitting drugs.  Tell your health care provider if you  often feel depressed.  Tell your health care provider if you have ever been abused or do not feel safe at home. This information is not intended to replace advice given to you by your health care provider. Make sure you discuss any questions you have with your health care provider. Document Released: 05/04/2011 Document Revised: 03/26/2016 Document Reviewed: 07/23/2015 Elsevier Interactive Patient Education  [employer].

## 2017-06-03 DIAGNOSIS — R197 Diarrhea, unspecified: Secondary | ICD-10-CM | POA: Diagnosis not present

## 2017-06-04 ENCOUNTER — Ambulatory Visit: Payer: PPO | Admitting: Family Medicine

## 2017-07-06 DIAGNOSIS — M542 Cervicalgia: Secondary | ICD-10-CM | POA: Diagnosis not present

## 2017-07-06 DIAGNOSIS — M4803 Spinal stenosis, cervicothoracic region: Secondary | ICD-10-CM | POA: Diagnosis not present

## 2017-07-06 DIAGNOSIS — I10 Essential (primary) hypertension: Secondary | ICD-10-CM | POA: Diagnosis not present

## 2017-07-06 DIAGNOSIS — Z6826 Body mass index (BMI) 26.0-26.9, adult: Secondary | ICD-10-CM | POA: Diagnosis not present

## 2017-07-08 ENCOUNTER — Other Ambulatory Visit: Payer: Self-pay | Admitting: Neurosurgery

## 2017-07-15 ENCOUNTER — Other Ambulatory Visit: Payer: Self-pay | Admitting: Family Medicine

## 2017-07-26 DIAGNOSIS — G5603 Carpal tunnel syndrome, bilateral upper limbs: Secondary | ICD-10-CM | POA: Diagnosis not present

## 2017-07-26 DIAGNOSIS — M9981 Other biomechanical lesions of cervical region: Secondary | ICD-10-CM | POA: Diagnosis not present

## 2017-07-26 DIAGNOSIS — M5412 Radiculopathy, cervical region: Secondary | ICD-10-CM | POA: Diagnosis not present

## 2017-08-11 ENCOUNTER — Other Ambulatory Visit: Payer: Self-pay | Admitting: Family Medicine

## 2017-08-11 DIAGNOSIS — Z1231 Encounter for screening mammogram for malignant neoplasm of breast: Secondary | ICD-10-CM | POA: Diagnosis not present

## 2017-08-11 DIAGNOSIS — Z803 Family history of malignant neoplasm of breast: Secondary | ICD-10-CM | POA: Diagnosis not present

## 2017-08-11 LAB — HM MAMMOGRAPHY

## 2017-08-30 ENCOUNTER — Encounter: Payer: Self-pay | Admitting: Family Medicine

## 2017-08-30 DIAGNOSIS — M9981 Other biomechanical lesions of cervical region: Secondary | ICD-10-CM | POA: Diagnosis not present

## 2017-08-30 DIAGNOSIS — M4803 Spinal stenosis, cervicothoracic region: Secondary | ICD-10-CM | POA: Diagnosis not present

## 2017-08-30 DIAGNOSIS — M4312 Spondylolisthesis, cervical region: Secondary | ICD-10-CM | POA: Diagnosis not present

## 2017-08-30 DIAGNOSIS — M542 Cervicalgia: Secondary | ICD-10-CM | POA: Diagnosis not present

## 2017-09-06 ENCOUNTER — Other Ambulatory Visit: Payer: Self-pay | Admitting: Family Medicine

## 2017-09-13 DIAGNOSIS — Z85828 Personal history of other malignant neoplasm of skin: Secondary | ICD-10-CM | POA: Diagnosis not present

## 2017-09-13 DIAGNOSIS — L814 Other melanin hyperpigmentation: Secondary | ICD-10-CM | POA: Diagnosis not present

## 2017-09-20 ENCOUNTER — Ambulatory Visit: Payer: PPO | Admitting: Family Medicine

## 2017-09-20 ENCOUNTER — Encounter: Payer: Self-pay | Admitting: Family Medicine

## 2017-09-20 VITALS — BP 120/80 | HR 82 | Temp 97.5°F | Wt 136.1 lb

## 2017-09-20 DIAGNOSIS — I10 Essential (primary) hypertension: Secondary | ICD-10-CM | POA: Diagnosis not present

## 2017-09-20 DIAGNOSIS — F339 Major depressive disorder, recurrent, unspecified: Secondary | ICD-10-CM | POA: Diagnosis not present

## 2017-09-20 DIAGNOSIS — E039 Hypothyroidism, unspecified: Secondary | ICD-10-CM | POA: Diagnosis not present

## 2017-09-20 DIAGNOSIS — R05 Cough: Secondary | ICD-10-CM

## 2017-09-20 DIAGNOSIS — R059 Cough, unspecified: Secondary | ICD-10-CM

## 2017-09-20 LAB — TSH: TSH: 1.7 u[IU]/mL (ref 0.35–4.50)

## 2017-09-20 MED ORDER — FLUOXETINE HCL 20 MG PO TABS: 20.0000 mg | ORAL_TABLET | Freq: Every day | ORAL | 3 refills | Status: DC

## 2017-09-20 NOTE — Progress Notes (Signed)
Subjective:     Patient ID: Julie [name], female   DOB: 1941/03/09, 76 y.o.   MRN: [id number]  HPI Patient here to discuss several items as follows  Hypothyroidism. She takes [employer] thyroid and is due for follow-up lab work. Denies any fatigue issues. 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  She had alopecia with synthetic thyroid hormone she states several years ago  Complains of some dry cough. Onset a few days ago. No fever. Cough is somewhat intermittent. She complains of a frequent "tickling sensation" back of her throat. Long history of GERD and currently not taking her omeprazole.  She has had some recent depressed mood. Has been on Prozac in the past and this worked very well. She would like to get back on this. No suicidal ideation.  Past Medical History:  Diagnosis Date  . Arthritis    lower back shoulder  . Arthropathy, unspecified, site unspecified   . Cancer ([place])    skin -spots on face removed  . Contact dermatitis and other eczema, due to unspecified cause    History  . GERD (gastroesophageal reflux disease)    history - no current prob - no med  . Hypertension    was taking lisinopril but had an allergic reaction to medicaion  . 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 Other bursitis disorders    history  . Personal history of colonic polyps   . PONV (postoperative nausea and vomiting)   . Pure hypercholesterolemia   . SVD (spontaneous vaginal delivery)    x 1  . Unspecified disorder of thyroid   . Unspecified hypothyroidism    Past Surgical History:  Procedure Laterality Date  . ANTERIOR REPAIR (CYSTOCELE) N/A 03/07/2014   Performed by Princess Bruins, MD at [facility] ORS  . ARTHROSCOPY KNEE Left 10/21/2012   Performed by Mcarthur Rossetti, MD at [facility] ORS  . BACK SURGERY  2002   L4/L5  . blephorplasty      bilateral   . BREAST SURGERY     x 2 breast implants, later removed  .  COLONOSCOPY    . devaited septum surgery  1975  . EYE SURGERY     bilateral cataract surgery  . Left Lumbar three-four, Lumbar four-five  Anterior lateral lumbar interbody fusion Left 01/22/2017   Performed by Erline Levine, MD at [place]    . Lumbar Percutaneous Pedicle Screw Placement Lumbar three-four, Lumbar four-five N/A 01/22/2017   Performed by Erline Levine, MD at [place] With Y-Mesh N/A 03/07/2014   Performed by Princess Bruins, MD at [facility] ORS  . ROBOTIC ASSISTED SUPRACERVICAL HYSTERECTOMY WITH BILATERAL SALPINGO OOPHORECTOMY N/A 03/07/2014   Performed by Princess Bruins, MD at [facility] ORS  . TOTAL KNEE ARTHROPLASTY Left 12/30/2012   Performed by Mcarthur Rossetti, MD at [facility] ORS  . TRANSVAGINAL TAPE (TVT) PROCEDURE WITH CYSTO N/A 03/07/2014   Performed by Princess Bruins, MD at [facility] ORS  . TUBAL LIGATION    . WISDOM TOOTH EXTRACTION      reports that  has never smoked. she has never used smokeless tobacco. She reports that she does not drink alcohol or use drugs. family history includes Cancer in her mother; Diabetes in her sister; Emphysema in her maternal aunt; Heart attack in her father; Stroke in her father. Allergies  Allergen Reactions  . Penicillins  Anaphylaxis    REACTION: causes tongue to swell Has patient had a PCN reaction causing immediate rash, facial/tongue/throat swelling, SOB or lightheadedness with hypotension: Yes swelling Has patient had a PCN reaction causing severe rash involving mucus membranes or skin necrosis:  Has patient had a PCN reaction that required hospitalization no Has patient had a PCN reaction occurring within the last 10 years: no If all of the above answers are "NO", then may proceed with Cephalosporin use.   . Adhesive [Tape] Other (See Comments)    Please use paper tape  . Codeine Nausea Only  . Lisinopril     REACTION: wheezing  . Other Other (See Comments)    Pt was  having knee surgery and was given an epidural, which did not work.  [name] [Oxycodone-Acetaminophen] Nausea And Vomiting     Review of Systems  Constitutional: Positive for fatigue.  HENT: Negative for congestion and sore throat.   Eyes: Negative for visual disturbance.  Respiratory: Negative for cough, chest tightness, shortness of breath and wheezing.   Cardiovascular: Negative for chest pain, palpitations and leg swelling.  Genitourinary: Negative for dysuria.  Neurological: Negative for dizziness, seizures, syncope, weakness, light-headedness and headaches.  Hematological: Negative for adenopathy.  Psychiatric/Behavioral: Positive for sleep disturbance. Negative for agitation, confusion, dysphoric mood and suicidal ideas.       Objective:   Physical Exam  Constitutional: She appears well-developed and well-nourished.  HENT:  Right Ear: External ear normal.  Left Ear: External ear normal.  Mouth/Throat: Oropharynx is clear and moist.  Neck: Neck supple. No thyromegaly present.  Cardiovascular: Normal rate and regular rhythm.  Pulmonary/Chest: Effort normal and breath sounds normal. No respiratory distress. She has no wheezes. She has no rales.  Musculoskeletal: She exhibits no edema.  Lymphadenopathy:    She has no cervical adenopathy.       Assessment:     #1 hypothyroidism  #2 dry cough. Suspect either viral etiology or possibly related to GERD  #3 history of recurrent depression    Plan:     -Check TSH -Refill Prozac 20 mg once daily and touch base in 2-3 weeks if not improving with that -Get back on regular use of omeprazole -Avoid eating within 2-3 hours of bedtime -Touch base if cough not resolving in 3-4 weeks  Eulas Post MD [facility]

## 2017-09-20 NOTE — Patient Instructions (Signed)
Get back on the Prilosec (Omeprazole) and let me know if cough not better in 2-3 weeks.

## 2017-09-21 ENCOUNTER — Other Ambulatory Visit: Payer: Self-pay | Admitting: Family Medicine

## 2017-09-21 MED ORDER — THYROID 30 MG PO TABS: ORAL_TABLET | ORAL | 11 refills | Status: DC

## 2017-10-20 DIAGNOSIS — R52 Pain, unspecified: Secondary | ICD-10-CM | POA: Diagnosis not present

## 2017-10-20 DIAGNOSIS — J988 Other specified respiratory disorders: Secondary | ICD-10-CM | POA: Diagnosis not present

## 2017-10-20 DIAGNOSIS — J101 Influenza due to other identified influenza virus with other respiratory manifestations: Secondary | ICD-10-CM | POA: Diagnosis not present

## 2017-11-04 NOTE — H&P (Signed)
Patient ID:   [id number] Patient: Julie [name]  Date of Birth: 1941/03/19 Visit Type: Office Visit   Date: 08/30/2017 09:30 AM Provider: Marchia Meiers. Vertell Limber MD   This 77 year old female presents for EMG results.   History of Present Illness: 1.  EMG results  Patient returns to review EMG results.  ACDF C7-T1 is currently scheduled for January 10th.  She has her collar and postop instructions sheet, and agrees to call if she has any questions.  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  Pathophysiology is that of mild sensory demyelination.  No evidence for concomitant median neuropathy more proximally such as a pronator syndrome and also no evidence for cervical radiculopathy, brachial plexopathy or peripheral polyneuropathy.  I suspect she may be experiencing some cervical radiculitis that is not manifesting on electrodiagnostic testing (i.e.  not resulting in axonal loss).   Physical: Weakness persists in left hand. Numbness throughout left hand on pin test.         Medical/Surgical/Interim History Reviewed, no change.     Family History: Reviewed, no changes.    Social History: Reviewed, no changes.   MEDICATIONS(added, continued or stopped this visit): Started Medication Directions Instruction Stopped   Armour Thyroid 30 mg tablet      biotin 5 mg capsule      fluoxetine 20 mg capsule take 1 capsule by oral route  every day in the morning     Fosamax 70 mg tablet take 1 tablet by oral route  every week in the morning, at least 30 min before first food, beverage, or medication of day     turmeric root extract 500 mg capsule      Vitamin B-12 1,000 mcg/mL oral drops      Vitamin D3 5,000 unit tablet        ALLERGIES: Ingredient Reaction Medication Name Comment  CODEINE Unknown    PENICILLINS Unknown    LISINOPRIL     OXYCODONE       Reviewed, no changes.    Vitals Date Temp F BP Pulse Ht In Wt Lb BMI BSA Pain Score  08/30/2017    59    0/10  08/30/2017  149/80 67 59 134.2 27.1  0/10      IMPRESSION Patient returns to review EMG results. EMG shows mild bilateral carpal tunnel. On confrontational testing, weakness and numbness persists in the left hand. Patient will proceed with ACDF C7-T1 in January.      Pain Management Plan Pain Scale: 0/10. Method: Numeric Pain Intensity Scale. Onset: 04/05/2016.  Patient will proceed with ACDF C7-T1 in January.             Provider:  Vertell Limber MD, Marchia Meiers 08/30/2017 10:20 AM  Dictation edited by: Lucita Lora    CC Providers: [place],  [place]  [id number]-   Kimberly Shelton  [facility] [address] [place], [place] [address]-              Electronically signed by Marchia Meiers. Vertell Limber MD on 08/31/2017 04:09 PM  Patient ID:   [id number] Patient: Julie [name]  Date of Birth: 11-13-1940 Visit Type: Office Visit   Date: 07/06/2017 02:45 PM Provider: Marchia Meiers. Vertell Limber MD   This 77 year old female presents for back pain.   History of Present Illness: 1.  back pain  Patient returns  today with persistent left    arm complaints.  On examination today she does not appear to have triceps weakness but does have hand intrinsic weakness on the left and 4- out of 5 and also finger extensor  weakness of 4- out of 5.   this fits with her   the MRI which shows subluxation of C7 on T1 with left foraminal stenosis causing left C8 nerve  root compression.    I reviewed cervical radiographs which show mobile anterolisthesis of C7 on T1 of 8 millimeters on flexion, 5 millimeters neutral lateral radiograph , 7 millimeters on extension.    In addition on examination the patient has signs of carpal tunnel syndrome on the left with left Tinel's at the wrist and positive left  Phalen sign.  She notes that she broke her left  wrist in the not too distant past.         Medical/Surgical/Interim History Reviewed, no change.     PAST MEDICAL HISTORY, SURGICAL HISTORY, FAMILY HISTORY, SOCIAL HISTORY AND REVIEW OF SYSTEMS I have reviewed the patient's past medical, surgical, family and social history as well as the comprehensive review of systems as included on the [facility] history form dated 08/26/2016, which I have signed.  Family History: Reviewed, no changes.   Patient reports there is no relevant family history.   Social History: Reviewed, no changes.   MEDICATIONS(added, continued or stopped this visit): Started Medication Directions Instruction Stopped   Armour Thyroid 30 mg tablet      biotin 5 mg capsule      fluoxetine 20 mg capsule take 1 capsule by oral route  every day in the morning     Fosamax 70 mg tablet take 1 tablet by oral route  every week in the morning, at least 30 min before first food, beverage, or medication of day    11/17/2016 gabapentin 300 mg capsule take 1 capsule by oral route 3 times every day  07/06/2017  01/18/2017 hydrocodone 5 mg-acetaminophen 325 mg tablet take 1 tablet by oral route every 8 hours as needed for pain  07/06/2017   meloxicam 7.5 mg tablet take 1 tablet by mouth route  every 12 hours  07/06/2017   omeprazole 40 mg capsule,delayed release take 1 capsule by oral route 2 times every day before a meal  07/06/2017   tizanidine 4 mg capsule take 1 capsule by oral route  every 8 hours PRN spasm  07/06/2017   turmeric root extract 500 mg capsule      Vitamin B-12 1,000 mcg/mL oral drops      Vitamin D3 5,000 unit tablet        ALLERGIES: Ingredient Reaction Medication Name Comment  CODEINE Unknown    PENICILLINS Unknown    LISINOPRIL     OXYCODONE      Reviewed, no changes.    Vitals Date Temp F BP Pulse Ht In Wt Lb BMI BSA Pain Score  07/06/2017  126/82 70 59 132 26.66   0/10      IMPRESSION  I have recommended to the patient that she undergo anterior cervical decompression and fusion at the C7-T1 level and this has been  scheduled for October 21st.  In addition she will be fitted for Vista cervical collar.  Risks and benefits were discussed in detail with the patient and nurse education was performed.  In addition we would like to go ahead with bilateral upper extremity EMG, nerve conduction velocity testing to  assess for carpal tunnel syndrome or peripheral neuropathy as a potential additive cause for her left  arm symptoms.  Completed Orders (this encounter) Order Details Reason Side Interpretation Result Initial Treatment Date Region  Hypertension education Patient to follow up with primary care provider.        Dietary management education, guidance, and counseling patient encouraged to eat a well balanced diet         Assessment/Plan # Detail Type Description   1. Assessment Cervicalgia (M54.2).       2. 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       3. Assessment Radiculopathy, cervical region (M54.12).   Plan Orders Further diagnostic evaluations ordered today include(s) Bilateral Upper Extremity EMG/NCV to be performed, Next Lab Date is on 07/26/2017.       4. Assessment Spondylolisthesis, cervical region (M43.12).   Plan Orders Vista Hard Collar Universal Se.       5. Assessment Essential (primary) hypertension (I10).       6. Assessment Body mass index (BMI) 26.0-26.9, adult (E67.54).   Plan Orders Today's instructions / counseling include(s) Dietary management education, guidance, and counseling.           Pain Management Plan Pain Scale: 0/10. Method: Numeric Pain Intensity Scale. Onset: 04/05/2016.      Proceed with anterior cervical decompression and fusion C7-T1 level with EMG/ NCV of both upper extremities  Orders: Diagnostic Procedures: Assessment Procedure  M54.12 Bilateral Upper Extremity EMG/NCV   M54.12 Cervical Spine- Lateral  Instruction(s)/Education: Assessment Instruction  I10 Hypertension education  Z68.26 Dietary management education, guidance, and counseling  Miscellaneous: Assessment   M43.12 Vista Hard Collar Universal Se             Provider:  Vertell Limber MD, Marchia Meiers 07/10/2017 2:19 PM  Dictation edited by: Marchia Meiers. Vertell Limber    CC Providers: [place],  [place]  [id number]   Kimberly Shelton  [facility] [address] [place], [place] [address]-              Electronically signed by Marchia Meiers Vertell Limber MD on 07/10/2017 02:19 PM

## 2017-11-08 NOTE — Pre-Procedure Instructions (Signed)
Julie [name]  11/08/2017      [id number] - [place],  - [place]. [place] [address] Phone: [phone] Fax: [phone]  [id number] [address], [place]. [place] [address] Phone: [phone] Fax: [phone]  [facility], [address] AT [facility] [address] [place] [address] [address] [place] [address] Phone: [phone] Fax: [phone]    Your procedure is scheduled on Thurs. Jan. 10  Report to [facility] Admitting at 5:30 A.M.  Call this number if you have problems the morning of surgery:  [phone]   Remember:  Do not eat food or drink liquids after midnight on Wed. Jan. 9   Take these medicines the morning of surgery with A SIP OF WATER : methocarbamol (robaxin) if needed, omeprazole (prilosec),thyroid (armour thyroid)                 7 days prior to surgery STOP taking any Aspirin(unless otherwise instructed by your surgeon), Aleve, Naproxen, Ibuprofen, Motrin, Advil, Goody's, BC's, all herbal medications, fish oil, and all vitamins   Do not wear jewelry, make-up or nail polish.  Do not wear lotions, powders, or perfumes, or deodorant.  Do not shave 48 hours prior to surgery.  Men may shave face and neck.  Do not bring valuables to the hospital.  [facility] is not responsible for any belongings or valuables.  Contacts, dentures or bridgework may not be worn into surgery.  Leave your suitcase in the car.  After surgery it may be brought to your room.  For patients admitted to the hospital, discharge time will be determined by your treatment team.  Patients discharged the day of surgery will not be allowed to drive home.    Special instructions:  [facility]- Preparing For Surgery  Before surgery, you can play an important role. Because skin is not  sterile, your skin needs to be as free of germs as possible. You can reduce the number of germs on your skin by washing with CHG (chlorahexidine gluconate) Soap before surgery.  CHG is an antiseptic cleaner which kills germs and bonds with the skin to continue killing germs even after washing.  Please do not use if you have an allergy to CHG or antibacterial soaps. If your skin becomes reddened/irritated stop using the CHG.  Do not shave (including legs and underarms) for at least 48 hours prior to first CHG shower. It is OK to shave your face.  Please follow these instructions carefully.   1. Shower the NIGHT BEFORE SURGERY and the MORNING OF SURGERY with CHG.   2. If you chose to wash your hair, wash your hair first as usual with your normal shampoo.  3. After you shampoo, rinse your hair and body thoroughly to remove the shampoo.  4. Use CHG as you would any other liquid soap. You can apply CHG directly to the skin and wash gently with a scrungie or a clean washcloth.   5. Apply the CHG Soap to your body ONLY FROM THE NECK DOWN.  Do not use on open wounds or open sores. Avoid contact with your eyes, ears, mouth and genitals (private parts). Wash Face and genitals (private parts)  with your normal soap.  6. Wash thoroughly, paying special attention to the area where your surgery will be performed.  7. Thoroughly rinse your body with  warm water from the neck down.  8. DO NOT shower/wash with your normal soap after using and rinsing off the CHG Soap.  9. Pat yourself dry with a CLEAN TOWEL.  10. Wear CLEAN PAJAMAS to bed the night before surgery, wear comfortable clothes the morning of surgery  11. Place CLEAN SHEETS on your bed the night of your first shower and DO NOT SLEEP WITH PETS.    Day of Surgery: Do not apply any deodorants/lotions. Please wear clean clothes to the hospital/surgery center.      Please read over the following fact sheets that you were given. Coughing and  Deep Breathing and Surgical Site Infection Prevention

## 2017-11-09 ENCOUNTER — Encounter (HOSPITAL_COMMUNITY)
Admission: RE | Admit: 2017-11-09 | Discharge: 2017-11-09 | Disposition: A | Discharge status: Home / Self Care | Payer: PPO | Source: Ambulatory Visit | Attending: Neurosurgery | Admitting: Neurosurgery

## 2017-11-09 ENCOUNTER — Encounter (HOSPITAL_COMMUNITY): Payer: Self-pay

## 2017-11-09 ENCOUNTER — Other Ambulatory Visit: Payer: Self-pay

## 2017-11-09 DIAGNOSIS — G5603 Carpal tunnel syndrome, bilateral upper limbs: Secondary | ICD-10-CM | POA: Diagnosis not present

## 2017-11-09 DIAGNOSIS — M5013 Cervical disc disorder with radiculopathy, cervicothoracic region: Secondary | ICD-10-CM | POA: Diagnosis not present

## 2017-11-09 DIAGNOSIS — G629 Polyneuropathy, unspecified: Secondary | ICD-10-CM | POA: Diagnosis not present

## 2017-11-09 DIAGNOSIS — Z885 Allergy status to narcotic agent status: Secondary | ICD-10-CM | POA: Diagnosis not present

## 2017-11-09 DIAGNOSIS — Z880 Allergy status to penicillin: Secondary | ICD-10-CM | POA: Diagnosis not present

## 2017-11-09 DIAGNOSIS — K219 Gastro-esophageal reflux disease without esophagitis: Secondary | ICD-10-CM | POA: Diagnosis not present

## 2017-11-09 DIAGNOSIS — I10 Essential (primary) hypertension: Secondary | ICD-10-CM | POA: Diagnosis not present

## 2017-11-09 DIAGNOSIS — M4313 Spondylolisthesis, cervicothoracic region: Secondary | ICD-10-CM | POA: Diagnosis not present

## 2017-11-09 DIAGNOSIS — Z79899 Other long term (current) drug therapy: Secondary | ICD-10-CM | POA: Diagnosis not present

## 2017-11-09 DIAGNOSIS — E039 Hypothyroidism, unspecified: Secondary | ICD-10-CM | POA: Diagnosis not present

## 2017-11-09 HISTORY — DX: Spondylolisthesis, cervical region: M43.12

## 2017-11-09 HISTORY — DX: Major depressive disorder, single episode, unspecified: F32.9

## 2017-11-09 HISTORY — DX: Depression, unspecified: F32.A

## 2017-11-09 HISTORY — DX: Prediabetes: R73.03

## 2017-11-09 LAB — CBC
HEMATOCRIT: 36.9 % (ref 36.0–46.0)
HEMOGLOBIN: 11.7 g/dL — AB (ref 12.0–15.0)
MCH: 28.6 pg (ref 26.0–34.0)
MCHC: 31.7 g/dL (ref 30.0–36.0)
MCV: 90.2 fL (ref 78.0–100.0)
Platelets: 293 10*3/uL (ref 150–400)
RBC: 4.09 MIL/uL (ref 3.87–5.11)
RDW: 14.3 % (ref 11.5–15.5)
WBC: 8.4 10*3/uL (ref 4.0–10.5)

## 2017-11-09 LAB — BASIC METABOLIC PANEL
ANION GAP: 8 (ref 5–15)
BUN: 12 mg/dL (ref 6–20)
CHLORIDE: 104 mmol/L (ref 101–111)
CO2: 24 mmol/L (ref 22–32)
Calcium: 9.1 mg/dL (ref 8.9–10.3)
Creatinine, Ser: 0.62 mg/dL (ref 0.44–1.00)
GFR calc Af Amer: >60 mL/min (ref >60)
Glucose, Bld: 92 mg/dL (ref 65–99)
POTASSIUM: 3.9 mmol/L (ref 3.5–5.1)
Sodium: 136 mmol/L (ref 135–145)

## 2017-11-09 LAB — TYPE AND SCREEN
ABO/RH(D): A NEG
ANTIBODY SCREEN: NEGATIVE

## 2017-11-09 LAB — SURGICAL PCR SCREEN
MRSA, PCR: NEGATIVE
STAPHYLOCOCCUS AUREUS: NEGATIVE

## 2017-11-09 NOTE — Progress Notes (Signed)
PCP - Dr. Jarold Song-   Cardiologist - Denies  Chest x-ray - Denies  EKG - 01/12/17 (E)  Stress Test - Denies  ECHO - Denies  Cardiac Cath - Denies  Sleep Study - No CPAP - None  LABS- 11/09/17: CBC, BMP, T/S   Anesthesia- Yes- EKG  Pt denies having chest pain, sob, or fever at this time. All instructions explained to the pt, with a verbal understanding of the material. Pt agrees to go over the instructions while at home for a better understanding. The opportunity to ask questions was provided.

## 2017-11-10 NOTE — Anesthesia Preprocedure Evaluation (Addendum)
Anesthesia Evaluation  Patient identified by MRN, date of birth, ID band Patient awake    Reviewed: Allergy & Precautions, NPO status , Patient's Chart, lab work & pertinent test results  History of Anesthesia Complications (+) PONV and history of anesthetic complications  Airway Mallampati: II  TM Distance: >3 FB Neck ROM: Full    Dental  (+) Dental Advisory Given, Upper Dentures, Partial Lower   Pulmonary neg pulmonary ROS, neg recent URI,    Pulmonary exam normal breath sounds clear to auscultation       Cardiovascular hypertension, Normal cardiovascular exam Rhythm:Regular Rate:Normal     Neuro/Psych PSYCHIATRIC DISORDERS Depression  Neuromuscular disease    GI/Hepatic Neg liver ROS, GERD  Medicated,  Endo/Other  Hypothyroidism   Renal/GU negative Renal ROS     Musculoskeletal  (+) Arthritis , Osteoarthritis,    Abdominal   Peds  Hematology negative hematology ROS (+)   Anesthesia Other Findings Day of surgery medications reviewed with the patient.  Reproductive/Obstetrics                                                            Anesthesia Evaluation  Patient identified by MRN, date of birth, ID band Patient awake    Reviewed: Allergy & Precautions, NPO status , Patient's Chart, lab work & pertinent test results  History of Anesthesia Complications (+) PONV and history of anesthetic complications  Airway Mallampati: II  TM Distance: >3 FB Neck ROM: Full    Dental  (+) Dental Advisory Given, Upper Dentures, Partial Lower   Pulmonary neg pulmonary ROS,    Pulmonary exam normal breath sounds clear to auscultation       Cardiovascular hypertension, (-) angina(-) CAD, (-) Past MI and (-) CHF Normal cardiovascular exam Rhythm:Regular Rate:Normal     Neuro/Psych PSYCHIATRIC DISORDERS Depression negative neurological ROS     GI/Hepatic Neg liver ROS, GERD   Medicated and Controlled,  Endo/Other  Hypothyroidism   Renal/GU negative Renal ROS     Musculoskeletal  (+) Arthritis ,   Abdominal   Peds  Hematology negative hematology ROS (+)   Anesthesia Other Findings Day of surgery medications reviewed with the patient.  Reproductive/Obstetrics                            Anesthesia Physical Anesthesia Plan  ASA: II  Anesthesia Plan: General   Post-op Pain Management:    Induction: Intravenous  Airway Management Planned: Oral ETT  Additional Equipment:   Intra-op Plan:   Post-operative Plan: Extubation in OR  Informed Consent: I have reviewed the patients History and Physical, chart, labs and discussed the procedure including the risks, benefits and alternatives for the proposed anesthesia with the patient or authorized representative who has indicated his/her understanding and acceptance.   Dental advisory given  Plan Discussed with: CRNA  Anesthesia Plan Comments: (Risks/benefits of general anesthesia discussed with patient including risk of damage to teeth, lips, gum, and tongue, nausea/vomiting, allergic reactions to medications, and the possibility of heart attack, stroke and death.  All patient questions answered.  Patient wishes to proceed.)        Anesthesia Quick Evaluation  Anesthesia Physical Anesthesia Plan  ASA: II  Anesthesia Plan: General   Post-op Pain Management:  Induction: Intravenous  PONV Risk Score and Plan: 4 or greater and TIVA, Dexamethasone and Ondansetron  Airway Management Planned: Oral ETT and Video Laryngoscope Planned  Additional Equipment:   Intra-op Plan:   Post-operative Plan: Extubation in OR  Informed Consent: I have reviewed the patients History and Physical, chart, labs and discussed the procedure including the risks, benefits and alternatives for the proposed anesthesia with the patient or authorized representative who has indicated  his/her understanding and acceptance.   Dental advisory given  Plan Discussed with: CRNA  Anesthesia Plan Comments: (Risks/benefits of general anesthesia discussed with patient including risk of damage to teeth, lips, gum, and tongue, nausea/vomiting, allergic reactions to medications, and the possibility of heart attack, stroke and death.  All patient questions answered.  Patient wishes to proceed.)       Anesthesia Quick Evaluation                                   Anesthesia Evaluation  Patient identified by MRN, date of birth, ID band Patient awake    Reviewed: Allergy & Precautions, NPO status , Patient's Chart, lab work & pertinent test results  History of Anesthesia Complications (+) PONV and history of anesthetic complications  Airway Mallampati: II  TM Distance: >3 FB Neck ROM: Full    Dental  (+) Dental Advisory Given, Upper Dentures, Partial Lower   Pulmonary neg pulmonary ROS,    Pulmonary exam normal breath sounds clear to auscultation       Cardiovascular hypertension, (-) angina(-) CAD, (-) Past MI and (-) CHF Normal cardiovascular exam Rhythm:Regular Rate:Normal     Neuro/Psych PSYCHIATRIC DISORDERS Depression negative neurological ROS     GI/Hepatic Neg liver ROS, GERD  Medicated and Controlled,  Endo/Other  Hypothyroidism   Renal/GU negative Renal ROS     Musculoskeletal  (+) Arthritis ,   Abdominal   Peds  Hematology negative hematology ROS (+)   Anesthesia Other Findings Day of surgery medications reviewed with the patient.  Reproductive/Obstetrics                            Anesthesia Physical Anesthesia Plan  ASA: II  Anesthesia Plan: General   Post-op Pain Management:    Induction: Intravenous  Airway Management Planned: Oral ETT  Additional Equipment:   Intra-op Plan:   Post-operative Plan: Extubation in OR  Informed Consent: I have reviewed the patients History and Physical,  chart, labs and discussed the procedure including the risks, benefits and alternatives for the proposed anesthesia with the patient or authorized representative who has indicated his/her understanding and acceptance.   Dental advisory given  Plan Discussed with: CRNA  Anesthesia Plan Comments: (Risks/benefits of general anesthesia discussed with patient including risk of damage to teeth, lips, gum, and tongue, nausea/vomiting, allergic reactions to medications, and the possibility of heart attack, stroke and death.  All patient questions answered.  Patient wishes to proceed.)        Anesthesia Quick Evaluation                                   Anesthesia Evaluation  Patient identified by MRN, date of birth, ID band Patient awake    Reviewed: Allergy & Precautions, NPO status , Patient's Chart, lab work & pertinent test results  History of  Anesthesia Complications (+) PONV and history of anesthetic complications  Airway Mallampati: II  TM Distance: >3 FB Neck ROM: Full    Dental  (+) Dental Advisory Given, Upper Dentures, Partial Lower   Pulmonary neg pulmonary ROS,    Pulmonary exam normal breath sounds clear to auscultation       Cardiovascular hypertension, (-) angina(-) CAD, (-) Past MI and (-) CHF Normal cardiovascular exam Rhythm:Regular Rate:Normal     Neuro/Psych PSYCHIATRIC DISORDERS Depression negative neurological ROS     GI/Hepatic Neg liver ROS, GERD  Medicated and Controlled,  Endo/Other  Hypothyroidism   Renal/GU negative Renal ROS     Musculoskeletal  (+) Arthritis ,   Abdominal   Peds  Hematology negative hematology ROS (+)   Anesthesia Other Findings Day of surgery medications reviewed with the patient.  Reproductive/Obstetrics                            Anesthesia Physical Anesthesia Plan  ASA: II  Anesthesia Plan: General   Post-op Pain Management:    Induction: Intravenous  Airway  Management Planned: Oral ETT  Additional Equipment:   Intra-op Plan:   Post-operative Plan: Extubation in OR  Informed Consent: I have reviewed the patients History and Physical, chart, labs and discussed the procedure including the risks, benefits and alternatives for the proposed anesthesia with the patient or authorized representative who has indicated his/her understanding and acceptance.   Dental advisory given  Plan Discussed with: CRNA  Anesthesia Plan Comments: (Risks/benefits of general anesthesia discussed with patient including risk of damage to teeth, lips, gum, and tongue, nausea/vomiting, allergic reactions to medications, and the possibility of heart attack, stroke and death.  All patient questions answered.  Patient wishes to proceed.)        Anesthesia Quick Evaluation

## 2017-11-11 ENCOUNTER — Ambulatory Visit (HOSPITAL_COMMUNITY): Payer: PPO | Admitting: Emergency Medicine

## 2017-11-11 ENCOUNTER — Observation Stay (HOSPITAL_COMMUNITY)
Admission: RE | Admit: 2017-11-11 | Discharge: 2017-11-11 | Disposition: A | Discharge status: Home / Self Care | Payer: PPO | Source: Ambulatory Visit | Attending: Neurosurgery | Admitting: Neurosurgery

## 2017-11-11 ENCOUNTER — Encounter (HOSPITAL_COMMUNITY)
Admission: RE | Disposition: A | Discharge status: Home / Self Care | Payer: Self-pay | Source: Ambulatory Visit | Attending: Neurosurgery

## 2017-11-11 ENCOUNTER — Ambulatory Visit (HOSPITAL_COMMUNITY): Payer: PPO

## 2017-11-11 ENCOUNTER — Encounter (HOSPITAL_COMMUNITY): Payer: Self-pay

## 2017-11-11 ENCOUNTER — Other Ambulatory Visit: Payer: Self-pay

## 2017-11-11 ENCOUNTER — Ambulatory Visit (HOSPITAL_COMMUNITY): Payer: PPO | Admitting: Anesthesiology

## 2017-11-11 DIAGNOSIS — Z880 Allergy status to penicillin: Secondary | ICD-10-CM | POA: Insufficient documentation

## 2017-11-11 DIAGNOSIS — E039 Hypothyroidism, unspecified: Secondary | ICD-10-CM | POA: Insufficient documentation

## 2017-11-11 DIAGNOSIS — Z885 Allergy status to narcotic agent status: Secondary | ICD-10-CM | POA: Diagnosis not present

## 2017-11-11 DIAGNOSIS — G629 Polyneuropathy, unspecified: Secondary | ICD-10-CM | POA: Diagnosis not present

## 2017-11-11 DIAGNOSIS — Z981 Arthrodesis status: Secondary | ICD-10-CM | POA: Diagnosis not present

## 2017-11-11 DIAGNOSIS — K219 Gastro-esophageal reflux disease without esophagitis: Secondary | ICD-10-CM | POA: Insufficient documentation

## 2017-11-11 DIAGNOSIS — M4313 Spondylolisthesis, cervicothoracic region: Secondary | ICD-10-CM | POA: Diagnosis not present

## 2017-11-11 DIAGNOSIS — Z419 Encounter for procedure for purposes other than remedying health state, unspecified: Secondary | ICD-10-CM

## 2017-11-11 DIAGNOSIS — Z79899 Other long term (current) drug therapy: Secondary | ICD-10-CM | POA: Insufficient documentation

## 2017-11-11 DIAGNOSIS — I10 Essential (primary) hypertension: Secondary | ICD-10-CM | POA: Diagnosis not present

## 2017-11-11 DIAGNOSIS — G5603 Carpal tunnel syndrome, bilateral upper limbs: Secondary | ICD-10-CM | POA: Insufficient documentation

## 2017-11-11 DIAGNOSIS — M5013 Cervical disc disorder with radiculopathy, cervicothoracic region: Secondary | ICD-10-CM | POA: Insufficient documentation

## 2017-11-11 DIAGNOSIS — M4803 Spinal stenosis, cervicothoracic region: Secondary | ICD-10-CM | POA: Diagnosis not present

## 2017-11-11 DIAGNOSIS — M4312 Spondylolisthesis, cervical region: Secondary | ICD-10-CM | POA: Diagnosis not present

## 2017-11-11 DIAGNOSIS — M4802 Spinal stenosis, cervical region: Secondary | ICD-10-CM | POA: Diagnosis not present

## 2017-11-11 HISTORY — PX: ANTERIOR CERVICAL DECOMP/DISCECTOMY FUSION: SHX1161

## 2017-11-11 SURGERY — ANTERIOR CERVICAL DECOMPRESSION/DISCECTOMY FUSION 1 LEVEL
Anesthesia: General | Site: Neck

## 2017-11-11 MED ORDER — PHENYLEPHRINE HCL 10 MG/ML IJ SOLN
INTRAVENOUS | Status: DC | PRN
  Administered 2017-11-11: 20 ug/min via INTRAVENOUS

## 2017-11-11 MED ORDER — ACETAMINOPHEN 500 MG PO TABS
1000.0000 mg | ORAL_TABLET | Freq: Once | ORAL | Status: AC
  Administered 2017-11-11: 1000 mg via ORAL
  Filled 2017-11-11: qty 2

## 2017-11-11 MED ORDER — CHLORHEXIDINE GLUCONATE CLOTH 2 % EX PADS: 6.0000 | MEDICATED_PAD | Freq: Once | CUTANEOUS | Status: DC

## 2017-11-11 MED ORDER — SENNOSIDES-DOCUSATE SODIUM 8.6-50 MG PO TABS: 1.0000 | ORAL_TABLET | Freq: Every evening | ORAL | Status: DC | PRN

## 2017-11-11 MED ORDER — ALUM & MAG HYDROXIDE-SIMETH 200-200-20 MG/5ML PO SUSP: 30.0000 mL | Freq: Four times a day (QID) | ORAL | Status: DC | PRN

## 2017-11-11 MED ORDER — ZOLPIDEM TARTRATE 5 MG PO TABS: 5.0000 mg | ORAL_TABLET | Freq: Every evening | ORAL | Status: DC | PRN

## 2017-11-11 MED ORDER — SODIUM CHLORIDE 0.9% FLUSH: 3.0000 mL | INTRAVENOUS | Status: DC | PRN

## 2017-11-11 MED ORDER — PROPOFOL 500 MG/50ML IV EMUL
INTRAVENOUS | Status: DC | PRN
  Administered 2017-11-11: 100 ug/kg/min via INTRAVENOUS

## 2017-11-11 MED ORDER — BUPIVACAINE HCL (PF) 0.5 % IJ SOLN
INTRAMUSCULAR | Status: DC | PRN
  Administered 2017-11-11: 3 mL

## 2017-11-11 MED ORDER — HYDROCODONE-ACETAMINOPHEN 5-325 MG PO TABS
2.0000 | ORAL_TABLET | ORAL | Status: DC | PRN
  Administered 2017-11-11: 1 via ORAL
  Filled 2017-11-11: qty 2

## 2017-11-11 MED ORDER — FENTANYL CITRATE (PF) 100 MCG/2ML IJ SOLN: 25.0000 ug | INTRAMUSCULAR | Status: DC | PRN

## 2017-11-11 MED ORDER — KCL IN DEXTROSE-NACL 20-5-0.45 MEQ/L-%-% IV SOLN
INTRAVENOUS | Status: AC
  Filled 2017-11-11: qty 1000

## 2017-11-11 MED ORDER — MORPHINE SULFATE (PF) 4 MG/ML IV SOLN: 2.0000 mg | INTRAVENOUS | Status: DC | PRN

## 2017-11-11 MED ORDER — PROPOFOL 10 MG/ML IV BOLUS
INTRAVENOUS | Status: AC
  Filled 2017-11-11: qty 20

## 2017-11-11 MED ORDER — VITAMIN E 180 MG (400 UNIT) PO CAPS
400.0000 [IU] | ORAL_CAPSULE | Freq: Every day | ORAL | Status: DC
  Administered 2017-11-11: 400 [IU] via ORAL
  Filled 2017-11-11: qty 1

## 2017-11-11 MED ORDER — PROPOFOL 10 MG/ML IV BOLUS
INTRAVENOUS | Status: DC | PRN
  Administered 2017-11-11: 20 mg via INTRAVENOUS
  Administered 2017-11-11: 100 mg via INTRAVENOUS

## 2017-11-11 MED ORDER — BIOTIN 3 MG PO TABS: 1.0000 | ORAL_TABLET | Freq: Every day | ORAL | Status: DC

## 2017-11-11 MED ORDER — THYROID 30 MG PO TABS: 30.0000 mg | ORAL_TABLET | Freq: Every day | ORAL | Status: DC

## 2017-11-11 MED ORDER — ONDANSETRON HCL 4 MG/2ML IJ SOLN: 4.0000 mg | Freq: Four times a day (QID) | INTRAMUSCULAR | Status: DC | PRN

## 2017-11-11 MED ORDER — ACETAMINOPHEN 325 MG PO TABS
650.0000 mg | ORAL_TABLET | ORAL | Status: DC | PRN
Start: 2017-11-11 — End: 2017-11-11

## 2017-11-11 MED ORDER — DEXAMETHASONE SODIUM PHOSPHATE 10 MG/ML IJ SOLN
INTRAMUSCULAR | Status: DC | PRN
  Administered 2017-11-11: 10 mg via INTRAVENOUS

## 2017-11-11 MED ORDER — SODIUM CHLORIDE 0.9 % IV SOLN: 250.0000 mL | INTRAVENOUS | Status: DC

## 2017-11-11 MED ORDER — CINNAMON 500 MG PO TABS: 1.0000 | ORAL_TABLET | Freq: Every day | ORAL | Status: DC

## 2017-11-11 MED ORDER — LIDOCAINE-EPINEPHRINE 1 %-1:100000 IJ SOLN
INTRAMUSCULAR | Status: DC | PRN
  Administered 2017-11-11: 3 mL

## 2017-11-11 MED ORDER — ROCURONIUM BROMIDE 10 MG/ML (PF) SYRINGE
PREFILLED_SYRINGE | INTRAVENOUS | Status: DC | PRN
  Administered 2017-11-11: 20 mg via INTRAVENOUS
  Administered 2017-11-11: 50 mg via INTRAVENOUS

## 2017-11-11 MED ORDER — PANTOPRAZOLE SODIUM 40 MG PO TBEC: 40.0000 mg | DELAYED_RELEASE_TABLET | Freq: Every day | ORAL | Status: DC

## 2017-11-11 MED ORDER — VANCOMYCIN HCL IN DEXTROSE 1-5 GM/200ML-% IV SOLN
1000.0000 mg | Freq: Once | INTRAVENOUS | Status: DC
  Filled 2017-11-11: qty 200

## 2017-11-11 MED ORDER — PANTOPRAZOLE SODIUM 40 MG IV SOLR: 40.0000 mg | Freq: Every day | INTRAVENOUS | Status: DC

## 2017-11-11 MED ORDER — CALCIUM-VITAMIN D 500-200 MG-UNIT PO TABS
1.0000 | ORAL_TABLET | Freq: Two times a day (BID) | ORAL | Status: DC
  Filled 2017-11-11: qty 1

## 2017-11-11 MED ORDER — BILBERRY 40 MG PO CAPS: ORAL_CAPSULE | Freq: Every day | ORAL | Status: DC

## 2017-11-11 MED ORDER — LIDOCAINE-EPINEPHRINE 1 %-1:100000 IJ SOLN
INTRAMUSCULAR | Status: AC
  Filled 2017-11-11: qty 1

## 2017-11-11 MED ORDER — SUGAMMADEX SODIUM 200 MG/2ML IV SOLN
INTRAVENOUS | Status: DC | PRN
  Administered 2017-11-11: 120 mg via INTRAVENOUS

## 2017-11-11 MED ORDER — FLEET ENEMA 7-19 GM/118ML RE ENEM: 1.0000 | ENEMA | Freq: Once | RECTAL | Status: DC | PRN

## 2017-11-11 MED ORDER — PHENOL 1.4 % MT LIQD: 1.0000 | OROMUCOSAL | Status: DC | PRN

## 2017-11-11 MED ORDER — ONDANSETRON HCL 4 MG PO TABS: 4.0000 mg | ORAL_TABLET | Freq: Four times a day (QID) | ORAL | Status: DC | PRN

## 2017-11-11 MED ORDER — ONDANSETRON HCL 4 MG/2ML IJ SOLN
INTRAMUSCULAR | Status: DC | PRN
  Administered 2017-11-11: 4 mg via INTRAVENOUS

## 2017-11-11 MED ORDER — AIRBORNE PO CHEW: 1.0000 | CHEWABLE_TABLET | Freq: Every day | ORAL | Status: DC

## 2017-11-11 MED ORDER — FENTANYL CITRATE (PF) 250 MCG/5ML IJ SOLN
INTRAMUSCULAR | Status: DC | PRN
  Administered 2017-11-11 (×4): 50 ug via INTRAVENOUS

## 2017-11-11 MED ORDER — THROMBIN (RECOMBINANT) 5000 UNITS EX SOLR
OROMUCOSAL | Status: DC | PRN
  Administered 2017-11-11: 5 mL via TOPICAL

## 2017-11-11 MED ORDER — METHOCARBAMOL 500 MG PO TABS
500.0000 mg | ORAL_TABLET | Freq: Four times a day (QID) | ORAL | Status: DC | PRN
  Administered 2017-11-11: 500 mg via ORAL
  Filled 2017-11-11: qty 1

## 2017-11-11 MED ORDER — BUPIVACAINE HCL (PF) 0.5 % IJ SOLN
INTRAMUSCULAR | Status: AC
Start: 2017-11-11 — End: 2017-11-11
  Filled 2017-11-11: qty 30

## 2017-11-11 MED ORDER — HYDROXYZINE HCL 50 MG/ML IM SOLN
50.0000 mg | Freq: Four times a day (QID) | INTRAMUSCULAR | Status: DC | PRN
  Administered 2017-11-11: 50 mg via INTRAMUSCULAR
  Filled 2017-11-11: qty 1

## 2017-11-11 MED ORDER — VITAMIN B-12 1000 MCG PO TABS
5000.0000 ug | ORAL_TABLET | Freq: Every day | ORAL | Status: DC
Start: 2017-11-11 — End: 2017-11-11
  Filled 2017-11-11: qty 5

## 2017-11-11 MED ORDER — BISACODYL 10 MG RE SUPP: 10.0000 mg | Freq: Every day | RECTAL | Status: DC | PRN

## 2017-11-11 MED ORDER — VANCOMYCIN HCL IN DEXTROSE 1-5 GM/200ML-% IV SOLN
1000.0000 mg | INTRAVENOUS | Status: AC
  Administered 2017-11-11: 1000 mg via INTRAVENOUS
  Filled 2017-11-11: qty 200

## 2017-11-11 MED ORDER — METHOCARBAMOL 1000 MG/10ML IJ SOLN: 500.0000 mg | Freq: Four times a day (QID) | INTRAVENOUS | Status: DC | PRN

## 2017-11-11 MED ORDER — VITAMIN D 1000 UNITS PO TABS
5000.0000 [IU] | ORAL_TABLET | Freq: Every day | ORAL | Status: DC
  Administered 2017-11-11: 5000 [IU] via ORAL
  Filled 2017-11-11: qty 5

## 2017-11-11 MED ORDER — ONDANSETRON HCL 4 MG/2ML IJ SOLN: 4.0000 mg | Freq: Once | INTRAMUSCULAR | Status: DC | PRN

## 2017-11-11 MED ORDER — 0.9 % SODIUM CHLORIDE (POUR BTL) OPTIME
TOPICAL | Status: DC | PRN
  Administered 2017-11-11: 1000 mL

## 2017-11-11 MED ORDER — THROMBIN (RECOMBINANT) 5000 UNITS EX SOLR
CUTANEOUS | Status: AC
  Filled 2017-11-11: qty 5000

## 2017-11-11 MED ORDER — FENTANYL CITRATE (PF) 250 MCG/5ML IJ SOLN
INTRAMUSCULAR | Status: AC
  Filled 2017-11-11: qty 5

## 2017-11-11 MED ORDER — MENTHOL 3 MG MT LOZG: 1.0000 | LOZENGE | OROMUCOSAL | Status: DC | PRN

## 2017-11-11 MED ORDER — TURMERIC CURCUMIN PO CAPS: ORAL_CAPSULE | Freq: Every day | ORAL | Status: DC

## 2017-11-11 MED ORDER — DOCUSATE SODIUM 100 MG PO CAPS
100.0000 mg | ORAL_CAPSULE | Freq: Two times a day (BID) | ORAL | Status: DC
  Administered 2017-11-11: 100 mg via ORAL
  Filled 2017-11-11: qty 1

## 2017-11-11 MED ORDER — SODIUM CHLORIDE 0.9% FLUSH: 3.0000 mL | Freq: Two times a day (BID) | INTRAVENOUS | Status: DC

## 2017-11-11 MED ORDER — METHOCARBAMOL 500 MG PO TABS: 500.0000 mg | ORAL_TABLET | Freq: Four times a day (QID) | ORAL | Status: DC | PRN

## 2017-11-11 MED ORDER — LACTATED RINGERS IV SOLN
INTRAVENOUS | Status: DC | PRN
  Administered 2017-11-11: 07:00:00 via INTRAVENOUS

## 2017-11-11 MED ORDER — LIDOCAINE 2% (20 MG/ML) 5 ML SYRINGE
INTRAMUSCULAR | Status: DC | PRN
  Administered 2017-11-11: 60 mg via INTRAVENOUS

## 2017-11-11 MED ORDER — ACETAMINOPHEN 650 MG RE SUPP: 650.0000 mg | RECTAL | Status: DC | PRN

## 2017-11-11 MED ORDER — KCL IN DEXTROSE-NACL 20-5-0.45 MEQ/L-%-% IV SOLN
INTRAVENOUS | Status: DC
  Administered 2017-11-11: 10:00:00 via INTRAVENOUS

## 2017-11-11 SURGICAL SUPPLY — 69 items
BASKET BONE COLLECTION (BASKET) ×6 IMPLANT
BIT DRILL 12X2.5XAVTR (BIT) ×1 IMPLANT
BIT DRILL AVIATOR 12 (BIT) ×1
BIT DRILL AVIATOR 12MM (BIT) ×1
BIT DRILL NEURO 2X3.1 SFT TUCH (MISCELLANEOUS) ×1 IMPLANT
BIT DRL 12X2.5XAVTR (BIT) ×1
BLADE ULTRA TIP 2M (BLADE) IMPLANT
BNDG GAUZE ELAST 4 BULKY (GAUZE/BANDAGES/DRESSINGS) IMPLANT
BUR BARREL STRAIGHT FLUTE 4.0 (BURR) ×3 IMPLANT
CANISTER SUCT 3000ML PPV (MISCELLANEOUS) ×3 IMPLANT
CARTRIDGE OIL MAESTRO DRILL (MISCELLANEOUS) ×1 IMPLANT
COVER MAYO STAND STRL (DRAPES) ×3 IMPLANT
DECANTER SPIKE VIAL GLASS SM (MISCELLANEOUS) ×3 IMPLANT
DERMABOND ADVANCED (GAUZE/BANDAGES/DRESSINGS) ×2
DERMABOND ADVANCED .7 DNX12 (GAUZE/BANDAGES/DRESSINGS) ×1 IMPLANT
DIFFUSER DRILL AIR PNEUMATIC (MISCELLANEOUS) ×3 IMPLANT
DRAPE HALF SHEET 40X57 (DRAPES) IMPLANT
DRAPE LAPAROTOMY 100X72 PEDS (DRAPES) ×3 IMPLANT
DRAPE MICROSCOPE LEICA (MISCELLANEOUS) ×3 IMPLANT
DRAPE POUCH INSTRU U-SHP 10X18 (DRAPES) ×3 IMPLANT
DRILL NEURO 2X3.1 SOFT TOUCH (MISCELLANEOUS) ×3
DRSG OPSITE POSTOP 3X4 (GAUZE/BANDAGES/DRESSINGS) ×3 IMPLANT
DURAPREP 6ML APPLICATOR 50/CS (WOUND CARE) ×3 IMPLANT
ELECT COATED BLADE 2.86 ST (ELECTRODE) ×3 IMPLANT
ELECT REM PT RETURN 9FT ADLT (ELECTROSURGICAL) ×3
ELECTRODE REM PT RTRN 9FT ADLT (ELECTROSURGICAL) ×1 IMPLANT
GAUZE SPONGE 4X4 12PLY STRL (GAUZE/BANDAGES/DRESSINGS) IMPLANT
GAUZE SPONGE 4X4 16PLY XRAY LF (GAUZE/BANDAGES/DRESSINGS) IMPLANT
GLOVE BIO SURGEON STRL SZ8 (GLOVE) ×6 IMPLANT
GLOVE BIO SURGEON STRL SZ8.5 (GLOVE) ×3 IMPLANT
GLOVE BIOGEL PI IND STRL 8 (GLOVE) ×1 IMPLANT
GLOVE BIOGEL PI IND STRL 8.5 (GLOVE) ×1 IMPLANT
GLOVE BIOGEL PI INDICATOR 8 (GLOVE) ×2
GLOVE BIOGEL PI INDICATOR 8.5 (GLOVE) ×2
GLOVE ECLIPSE 8.0 STRL XLNG CF (GLOVE) ×3 IMPLANT
GLOVE EXAM NITRILE LRG STRL (GLOVE) IMPLANT
GLOVE EXAM NITRILE XL STR (GLOVE) IMPLANT
GLOVE EXAM NITRILE XS STR PU (GLOVE) IMPLANT
GOWN STRL REUS W/ TWL LRG LVL3 (GOWN DISPOSABLE) ×1 IMPLANT
GOWN STRL REUS W/ TWL XL LVL3 (GOWN DISPOSABLE) ×2 IMPLANT
GOWN STRL REUS W/TWL 2XL LVL3 (GOWN DISPOSABLE) ×3 IMPLANT
GOWN STRL REUS W/TWL LRG LVL3 (GOWN DISPOSABLE) ×2
GOWN STRL REUS W/TWL XL LVL3 (GOWN DISPOSABLE) ×4
HALTER HD/CHIN CERV TRACTION D (MISCELLANEOUS) ×3 IMPLANT
HEMOSTAT POWDER KIT SURGIFOAM (HEMOSTASIS) ×3 IMPLANT
KIT BASIN OR (CUSTOM PROCEDURE TRAY) ×3 IMPLANT
KIT ROOM TURNOVER OR (KITS) ×3 IMPLANT
NEEDLE HYPO 18GX1.5 BLUNT FILL (NEEDLE) IMPLANT
NEEDLE HYPO 25X1 1.5 SAFETY (NEEDLE) ×3 IMPLANT
NEEDLE SPNL 22GX3.5 QUINCKE BK (NEEDLE) ×6 IMPLANT
NS IRRIG 1000ML POUR BTL (IV SOLUTION) ×3 IMPLANT
OIL CARTRIDGE MAESTRO DRILL (MISCELLANEOUS) ×3
PACK LAMINECTOMY NEURO (CUSTOM PROCEDURE TRAY) ×3 IMPLANT
PAD ARMBOARD 7.5X6 YLW CONV (MISCELLANEOUS) ×9 IMPLANT
PATTIES SURGICAL .5 X.5 (GAUZE/BANDAGES/DRESSINGS) ×3 IMPLANT
PATTIES SURGICAL 1X1 (DISPOSABLE) ×3 IMPLANT
PEEK AVA AS 7X12X14X4 (Peek) ×3 IMPLANT
PIN DISTRACTION 14MM (PIN) ×6 IMPLANT
PLATE AVIATOR ASSY 1LVL SZ 12 (Plate) ×3 IMPLANT
RUBBERBAND STERILE (MISCELLANEOUS) ×6 IMPLANT
SCREW AVIATOR VAR SELFTAP 4X12 (Screw) ×12 IMPLANT
SPONGE INTESTINAL PEANUT (DISPOSABLE) ×3 IMPLANT
SPONGE SURGIFOAM ABS GEL SZ50 (HEMOSTASIS) IMPLANT
STAPLER SKIN PROX WIDE 3.9 (STAPLE) IMPLANT
SUT VIC AB 3-0 SH 8-18 (SUTURE) ×6 IMPLANT
SYR 3ML LL SCALE MARK (SYRINGE) IMPLANT
TOWEL GREEN STERILE (TOWEL DISPOSABLE) ×3 IMPLANT
TOWEL GREEN STERILE FF (TOWEL DISPOSABLE) ×3 IMPLANT
WATER STERILE IRR 1000ML POUR (IV SOLUTION) ×3 IMPLANT

## 2017-11-11 NOTE — Evaluation (Signed)
Physical Therapy Evaluation Patient Details Name: Julie [name] MRN: [id number] DOB: May 03, 1941 Today's Date: 11/11/2017   History of Present Illness  Pt is a 77 y/o female s/p C7-T1 ACDF. PMH includes HTN.   Clinical Impression  Patient is s/p above surgery resulting in the deficits listed below (see PT Problem List). PTA, pt was independent. Upon eval, pt slightly unsteady and required min guard support. Also experienced episode of nausea/vomiting which limited gait distance; notified RN. Anticipate pt will progress well once feeling better. Reports husband is available intermittently, however, works throughout the day. Patient will benefit from skilled PT to increase their independence and safety with mobility (while adhering to their precautions) to allow discharge to the venue listed below. Will continue to follow acutely.      Follow Up Recommendations No PT follow up;Supervision for mobility/OOB    Equipment Recommendations  None recommended by PT    Recommendations for Other Services       Precautions / Restrictions Precautions Precautions: Cervical Precaution Comments: Reviewed cervical precautions with pt.  Required Braces or Orthoses: Cervical Brace Cervical Brace: Hard collar Restrictions Weight Bearing Restrictions: No      Mobility  Bed Mobility Overal bed mobility: Needs Assistance Bed Mobility: Rolling;Sidelying to Sit;Sit to Sidelying Rolling: Supervision Sidelying to sit: Supervision     Sit to sidelying: Min assist General bed mobility comments: Supervision to ensure log roll technique to come up to sitting. Required min A for LE lift assist to return to sidelying.   Transfers Overall transfer level: Needs assistance Equipment used: None Transfers: Sit to/from Stand Sit to Stand: Min guard         General transfer comment: Min guard for steadying assist.   Ambulation/Gait Ambulation/Gait assistance: Min guard Ambulation Distance (Feet):  150 Feet Assistive device: (IV pole) Gait Pattern/deviations: Step-through pattern;Decreased stride length Gait velocity: Decreased  Gait velocity interpretation: Below normal speed for age/gender General Gait Details: Slow, slightly unsteady gait. Required min guard and use of IV pole for steadying. Eduated about generalized walking program to perform at home. During gait, pt with episode of nausea/vomiting which limited further distance.   Stairs            Wheelchair Mobility    Modified Rankin (Stroke Patients Only)       Balance Overall balance assessment: Needs assistance Sitting-balance support: No upper extremity supported;Feet supported Sitting balance-Leahy Scale: Good     Standing balance support: No upper extremity supported;During functional activity;Single extremity supported Standing balance-Leahy Scale: Fair                               Pertinent Vitals/Pain Pain Assessment: Faces Faces Pain Scale: Hurts a little bit Pain Location: neck  Pain Descriptors / Indicators: Aching;Operative site guarding Pain Intervention(s): Limited activity within patient's tolerance;Monitored during session;Repositioned    Home Living Family/patient expects to be discharged to:: Private residence Living Arrangements: Spouse/significant other Available Help at Discharge: Family(husband works during the day ) Type of Home: House Home Access: Stairs to enter   [employer] of Steps: 1(threshold step ) Home Layout: Two level;Able to live on main level with bedroom/bathroom;Laundry or work area in [place]: Marine scientist - single point;Bedside commode      Prior Function Level of Independence: Independent               Hand Dominance        Extremity/Trunk Assessment  Upper Extremity Assessment Upper Extremity Assessment: Defer to OT evaluation    Lower Extremity Assessment Lower Extremity Assessment: Generalized  weakness    Cervical / Trunk Assessment Cervical / Trunk Assessment: Other exceptions Cervical / Trunk Exceptions: s/p ACDF   Communication   Communication: No difficulties  Cognition Arousal/Alertness: Awake/alert Behavior During Therapy: WFL for tasks assessed/performed Overall Cognitive Status: Within Functional Limits for tasks assessed                                        General Comments General comments (skin integrity, edema, etc.): Pt's husband present during session. Educated about how to adjust cervical collar to provide appropriate support.     Exercises     Assessment/Plan    PT Assessment Patient needs continued PT services  PT Problem List Decreased strength;Decreased balance;Decreased mobility;Decreased knowledge of use of DME;Decreased knowledge of precautions;Pain;Decreased activity tolerance       PT Treatment Interventions DME instruction;Gait training;Stair training;Functional mobility training;Therapeutic activities;Therapeutic exercise;Balance training;Neuromuscular re-education;Patient/family education    PT Goals (Current goals can be found in the Care Plan section)  Acute Rehab PT Goals Patient Stated Goal: to go home today  PT Goal Formulation: With patient Time For Goal Achievement: 11/25/17 Potential to Achieve Goals: Good    Frequency Min 5X/week   Barriers to discharge        Co-evaluation               AM-PAC PT "6 Clicks" Daily Activity  Outcome Measure Difficulty turning over in bed (including adjusting bedclothes, sheets and blankets)?: None Difficulty moving from lying on back to sitting on the side of the bed? : A Little Difficulty sitting down on and standing up from a chair with arms (e.g., wheelchair, bedside commode, etc,.)?: Unable Help needed moving to and from a bed to chair (including a wheelchair)?: A Little Help needed walking in hospital room?: A Little Help needed climbing 3-5 steps with a  railing? : A Lot 6 Click Score: 16    End of Session Equipment Utilized During Treatment: Gait belt;Cervical collar Activity Tolerance: Treatment limited secondary to medical complications (Comment)(nausea/vomiting ) Patient left: in bed;with call bell/phone within reach;with family/visitor present Nurse Communication: Mobility status PT Visit Diagnosis: Unsteadiness on feet (R26.81);Other abnormalities of gait and mobility (R26.89);Pain Pain - part of body: (neck )    Time: 9528-4132 PT Time Calculation (min) (ACUTE ONLY): 26 min   Charges:   PT Evaluation $PT Eval Low Complexity: 1 Low PT Treatments $Gait Training: 8-22 mins   PT G Codes:        Leighton Ruff, PT, DPT  Acute Rehabilitation Services  Pager: [phone]   Rudean Hitt 11/11/2017, 2:03 PM

## 2017-11-11 NOTE — Transfer of Care (Signed)
Immediate Anesthesia Transfer of Care Note  Patient: Julie [name]  Procedure(s) Performed: Cervical seven - Thoracic one  Anterior cervical decompression/discectomy/fusion (N/A Neck)  Patient Location: PACU  Anesthesia Type:General  Level of Consciousness: awake, alert  and oriented  Airway & Oxygen Therapy: Patient Spontanous Breathing and Patient connected to nasal cannula oxygen  Post-op Assessment: Report given to RN and Post -op Vital signs reviewed and stable  Post vital signs: Reviewed and stable  Last Vitals:  Vitals:   11/11/17 0548  BP: 140/63  Pulse: 67  Resp: 20  Temp: 36.5 C  SpO2: 94%    Last Pain:  Vitals:   11/11/17 0548  TempSrc: Oral      Patients Stated Pain Goal: 3 (83/35/82 5189)  Complications: No apparent anesthesia complications

## 2017-11-11 NOTE — Brief Op Note (Signed)
11/11/2017  9:20 AM  PATIENT:  Julie [name]  77 y.o. female  PRE-OPERATIVE DIAGNOSIS:  Spondylolisthesis, Cervicothoracic region, foraminal stenosis, herniated disc, cervicalgia, radiculopathy C 7 T 1 level  POST-OPERATIVE DIAGNOSIS:  Spondylolisthesis, Cervicothoracic region, foraminal stenosis, herniated disc, cervicalgia, radiculopathy C 7 T 1 level  PROCEDURE:  Procedure(s): Cervical seven - Thoracic one  Anterior cervical decompression/discectomy/fusion (N/A) with PEEK cage, autograft, plate  SURGEON:  Surgeon(s) and Role:    Erline Levine, MD - Primary    * Newman Pies, MD - Assisting  PHYSICIAN ASSISTANT:   ASSISTANTS: Poteat, RN   ANESTHESIA:   general  EBL:  200 mL   BLOOD ADMINISTERED:none  DRAINS: none   LOCAL MEDICATIONS USED:  MARCAINE    and LIDOCAINE   SPECIMEN:  No Specimen  DISPOSITION OF SPECIMEN:  N/A  COUNTS:  YES  TOURNIQUET:  * No tourniquets in log *  DICTATION:DICTATION: Patient is 77 year old female with subluxation of C7 on T 1 with stenosis, cord compression, HNP, foraminal stenosis, radiculopathy.  It was elected to take her to surgery for anterior cervical decompression and fusion C 7 T 1 level.  PROCEDURE: Patient was brought to operating room and following the smooth and uncomplicated induction of general endotracheal anesthesia her head was placed on a horseshoe head holder she was placed in 5 pounds of Holter traction and her anterior neck was prepped and draped in usual sterile fashion. An incision was made on the left side of midline after infiltrating the skin and subcutaneous tissues with local lidocaine. The platysmal layer was incised and subplatysmal dissection was performed exposing the anterior border sternocleidomastoid muscle. Using blunt dissection the carotid sheath was kept lateral and trachea and esophagus kept medial exposing the anterior cervical spine. A bent spinal needle was placed it was felt to be the C 56 and  C 67 levels and this was confirmed on intraoperative x-ray. Longus coli muscles were taken down from the anterior cervical spine using electrocautery and key elevator and self-retaining retractor was placed exposing the C 7 T 1 level along with up/down retractor. The interspace was incised and a thorough discectomy was performed. Distraction pins were placed. Uncinate spurs and central spondylitic ridges were drilled down with a high-speed drill. The spinal cord dura and both C 8 nerve roots were widely decompressed. Hemostasis was assured. After trial sizing a 7 mm lordotic PEEK cage was selected and packed with local autograft. This was tamped into position and countersunk appropriately. Distraction weight was removed. A 12 mm Aviator anterior cervical plate was affixed to the cervical spine with 12 mm variable-angle screws 2 at C 7, 2 at T 1. All screws were well-positioned and locking mechanisms were engaged. A final X ray was not obtained as this level would not be visualizable on an intraoperative radiograph.. Soft tissues were inspected and found to be in good repair. The wound was irrigated. The platysma layer was closed with 3-0 Vicryl stitches and the skin was reapproximated with 3-0 Vicryl subcuticular stitches. The wound was dressed with Dermabond. Counts were correct at the end of the case. Patient was extubated and taken to recovery in stable and satisfactory condition.   PLAN OF CARE: Admit for overnight observation  PATIENT DISPOSITION:  PACU - hemodynamically stable.   Delay start of Pharmacological VTE agent (>24hrs) due to surgical blood loss or risk of bleeding: yes

## 2017-11-11 NOTE — Anesthesia Procedure Notes (Addendum)
Procedure Name: Intubation Date/Time: 11/11/2017 7:32 AM Performed by: Wilburn Cornelia, CRNA Pre-anesthesia Checklist: Patient identified, Emergency Drugs available, Suction available, Patient being monitored and Timeout performed Patient Re-evaluated:Patient Re-evaluated prior to induction Oxygen Delivery Method: Circle system utilized Preoxygenation: Pre-oxygenation with 100% oxygen Induction Type: IV induction Ventilation: Mask ventilation without difficulty and Oral airway inserted - appropriate to patient size Grade View: Grade I Tube type: Oral Number of attempts: 1 Airway Equipment and Method: Stylet and Video-laryngoscopy Placement Confirmation: ETT inserted through vocal cords under direct vision,  positive ETCO2,  CO2 detector and breath sounds checked- equal and bilateral Secured at: 20 cm Tube secured with: Tape Dental Injury: Teeth and Oropharynx as per pre-operative assessment  Comments: Head and neck neutral during intubation

## 2017-11-11 NOTE — Discharge Summary (Signed)
Physician Discharge Summary  Patient ID: Julie [name] MRN: [id number] DOB/AGE: 1941/05/03 77 y.o.  Admit date: 11/11/2017 Discharge date: 11/11/2017  Admission Diagnoses:Spondylolisthesis C 7 T 1 with stenosis and radiculopathy  Discharge Diagnoses: Same Active Problems:   Spondylolisthesis of cervicothoracic region   Discharged Condition: good  Hospital Course: Patient underwent anterior cervical discectomy and fusion C 7 T 1 level with resolution of radicular pain and weakness.  She did well after surgery and was discharged home on the afternoon of her surgery.    Consults: None  Significant Diagnostic Studies: None  Treatments: surgery: Patient underwent anterior cervical discectomy and fusion C 7 T 1 level with resolution of radicular pain and weakness  Discharge Exam: Blood pressure (!) 143/52, pulse 63, temperature 97.6 F (36.4 C), temperature source Oral, resp. rate 16, height 5' (1.524 m), weight 61.2 kg (135 lb), SpO2 96 %. Neurologic: Alert and oriented X 3, normal strength and tone. Normal symmetric reflexes. Normal coordination and gait Wound:CDI  Disposition: Home  Discharge Instructions    Diet - low sodium heart healthy   Complete by:  As directed    Increase activity slowly   Complete by:  As directed      Allergies as of 11/11/2017      Reactions   Penicillins Anaphylaxis   REACTION: causes tongue to swell Has patient had a PCN reaction causing immediate rash, facial/tongue/throat swelling, SOB or lightheadedness with hypotension: Yes swelling Has patient had a PCN reaction causing severe rash involving mucus membranes or skin necrosis:  Has patient had a PCN reaction that required hospitalization no Has patient had a PCN reaction occurring within the last 10 years: no If all of the above answers are "NO", then may proceed with Cephalosporin use.   Adhesive [tape] Other (See Comments)   Please use paper tape   Codeine Nausea Only   Lisinopril     REACTION: wheezing   Other Other (See Comments)   Pt was having knee surgery and was given an epidural, which did not work.   Percocet [oxycodone-acetaminophen] Nausea And Vomiting      Medication List    TAKE these medications   AIRBORNE Chew Chew 1 tablet by mouth daily.   alendronate 70 MG tablet Commonly known as:  FOSAMAX Take 1 tablet (70 mg total) by mouth every 7 (seven) days. Take with a full glass of water on an empty stomach.   BILBERRY PO Take 1,200 mg elemental calcium/kg/hr by mouth daily.   Biotin 3 MG Tabs Take 1 tablet by mouth daily.   CALCIUM 500 + D3 PO Take 1 tablet by mouth 2 (two) times daily.   Cholecalciferol 5000 units capsule Take 5,000 Units by mouth daily.   Cinnamon 500 MG Tabs Take 1 tablet by mouth daily.   colchicine 0.6 MG tablet Commonly known as:  COLCRYS Take 1 tablet (0.6 mg total) by mouth 2 (two) times daily as needed.   methocarbamol 500 MG tablet Commonly known as:  ROBAXIN Take 1 tablet (500 mg total) by mouth every 6 (six) hours as needed for muscle spasms.   omeprazole 40 MG capsule Commonly known as:  PRILOSEC take 1 capsule by mouth once daily   thyroid 30 MG tablet Commonly known as:  ARMOUR THYROID TAKE 1 TABLET BY MOUTH EVERY DAY. NEED LABS FOR MORE REFILLS   TURMERIC CURCUMIN PO Take 1,000 mg by mouth daily.   VITAMIN B-12 PO Take 5,000 mcg by mouth daily.   vitamin E  400 UNIT capsule Take 1 tablet by mouth daily.        Signed: Peggyann Shoals, MD 11/11/2017, 4:46 PM

## 2017-11-11 NOTE — Interval H&P Note (Signed)
History and Physical Interval Note:  11/11/2017 7:16 AM  Julie [name]  has presented today for surgery, with the diagnosis of Spondylolisthesis, Cervical region  The various methods of treatment have been discussed with the patient and family. After consideration of risks, benefits and other options for treatment, the patient has consented to  Procedure(s) with comments: C7-T1 Anterior cervical decompression/discectomy/fusion (N/A) - C7-T1 Anterior cervical decompression/discectomy/fusion as a surgical intervention .  The patient's history has been reviewed, patient examined, no change in status, stable for surgery.  I have reviewed the patient's chart and labs.  Questions were answered to the patient's satisfaction.     Genora Arp D

## 2017-11-11 NOTE — Op Note (Signed)
11/11/2017  9:20 AM  PATIENT:  Julie [name]  77 y.o. female  PRE-OPERATIVE DIAGNOSIS:  Spondylolisthesis, Cervicothoracic region, foraminal stenosis, herniated disc, cervicalgia, radiculopathy C 7 T 1 level  POST-OPERATIVE DIAGNOSIS:  Spondylolisthesis, Cervicothoracic region, foraminal stenosis, herniated disc, cervicalgia, radiculopathy C 7 T 1 level  PROCEDURE:  Procedure(s): Cervical seven - Thoracic one  Anterior cervical decompression/discectomy/fusion (N/A) with PEEK cage, autograft, plate  SURGEON:  Surgeon(s) and Role:    Erline Levine, MD - Primary    * Newman Pies, MD - Assisting  PHYSICIAN ASSISTANT:   ASSISTANTS: Poteat, RN   ANESTHESIA:   general  EBL:  200 mL   BLOOD ADMINISTERED:none  DRAINS: none   LOCAL MEDICATIONS USED:  MARCAINE    and LIDOCAINE   SPECIMEN:  No Specimen  DISPOSITION OF SPECIMEN:  N/A  COUNTS:  YES  TOURNIQUET:  * No tourniquets in log *  DICTATION:DICTATION: Patient is 77 year old female with subluxation of C7 on T 1 with stenosis, cord compression, HNP, foraminal stenosis, radiculopathy.  It was elected to take her to surgery for anterior cervical decompression and fusion C 7 T 1 level.  PROCEDURE: Patient was brought to operating room and following the smooth and uncomplicated induction of general endotracheal anesthesia her head was placed on a horseshoe head holder she was placed in 5 pounds of Holter traction and her anterior neck was prepped and draped in usual sterile fashion. An incision was made on the left side of midline after infiltrating the skin and subcutaneous tissues with local lidocaine. The platysmal layer was incised and subplatysmal dissection was performed exposing the anterior border sternocleidomastoid muscle. Using blunt dissection the carotid sheath was kept lateral and trachea and esophagus kept medial exposing the anterior cervical spine. A bent spinal needle was placed it was felt to be the C 56 and  C 67 levels and this was confirmed on intraoperative x-ray. Longus coli muscles were taken down from the anterior cervical spine using electrocautery and key elevator and self-retaining retractor was placed exposing the C 7 T 1 level along with up/down retractor. The interspace was incised and a thorough discectomy was performed. Distraction pins were placed. Uncinate spurs and central spondylitic ridges were drilled down with a high-speed drill. The spinal cord dura and both C 8 nerve roots were widely decompressed. Hemostasis was assured. After trial sizing a 7 mm lordotic PEEK cage was selected and packed with local autograft. This was tamped into position and countersunk appropriately. Distraction weight was removed. A 12 mm Aviator anterior cervical plate was affixed to the cervical spine with 12 mm variable-angle screws 2 at C 7, 2 at T 1. All screws were well-positioned and locking mechanisms were engaged. A final X ray was not obtained as this level would not be visualizable on an intraoperative radiograph.. Soft tissues were inspected and found to be in good repair. The wound was irrigated. The platysma layer was closed with 3-0 Vicryl stitches and the skin was reapproximated with 3-0 Vicryl subcuticular stitches. The wound was dressed with Dermabond. Counts were correct at the end of the case. Patient was extubated and taken to recovery in stable and satisfactory condition.   PLAN OF CARE: Admit for overnight observation  PATIENT DISPOSITION:  PACU - hemodynamically stable.   Delay start of Pharmacological VTE agent (>24hrs) due to surgical blood loss or risk of bleeding: yes

## 2017-11-11 NOTE — Progress Notes (Signed)
Patient is discharged from room 3C04 at this time. Alert and in stable condition. IV site d/c'd and instructions read to patient with understanding verbalized. Left unit via wheelchair with all belongings at side. 

## 2017-11-11 NOTE — Progress Notes (Signed)
Pharmacy Antibiotic Note  CEAZIA HARB is a 77 y.o. female admitted on 11/11/2017 with surgical prophylaxis.  Pharmacy has been consulted for vancomycin dosing.  Patient received pre-op vancomycin 1g at 0735 this morning. No drain in place. SCr 0.62 with eCrCl ~45-10mL/min  Plan: Vancomycin 1g IV x1 at [place] to sign off as no future doses needed  Height: 5' (152.4 cm) Weight: 135 lb (61.2 kg) IBW/kg (Calculated) : 45.5  Temp (24hrs), Avg:97.7 F (36.5 C), Min:97.6 F (36.4 C), Max:97.8 F (36.6 C)  Recent Labs  Lab 11/09/17 1511  WBC 8.4  CREATININE 0.62    Estimated Creatinine Clearance: 47.4 mL/min (by C-G formula based on SCr of 0.62 mg/dL).    Allergies  Allergen Reactions  . Penicillins Anaphylaxis    REACTION: causes tongue to swell Has patient had a PCN reaction causing immediate rash, facial/tongue/throat swelling, SOB or lightheadedness with hypotension: Yes swelling Has patient had a PCN reaction causing severe rash involving mucus membranes or skin necrosis:  Has patient had a PCN reaction that required hospitalization no Has patient had a PCN reaction occurring within the last 10 years: no If all of the above answers are "NO", then may proceed with Cephalosporin use.   . Adhesive [Tape] Other (See Comments)    Please use paper tape  . Codeine Nausea Only  . Lisinopril     REACTION: wheezing  . Other Other (See Comments)    Pt was having knee surgery and was given an epidural, which did not work.  [name] [Oxycodone-Acetaminophen] Nausea And Vomiting     Thank you for allowing pharmacy to be a part of this patient's care.  Aamilah Augenstein D. Cathy Ropp, PharmD, BCPS Clinical Pharmacist Clinical Phone for 11/11/2017 until 3:30pm: [phone] If after 3:30pm, please call main pharmacy at [phone] 11/11/2017 10:42 AM

## 2017-11-11 NOTE — Anesthesia Postprocedure Evaluation (Signed)
Anesthesia Post Note  Patient: Wendi Lastra Jolin  Procedure(s) Performed: Cervical seven - Thoracic one  Anterior cervical decompression/discectomy/fusion (N/A Neck)     Patient location during evaluation: PACU Anesthesia Type: General Level of consciousness: awake and alert Pain management: pain level controlled Vital Signs Assessment: post-procedure vital signs reviewed and stable Respiratory status: spontaneous breathing, nonlabored ventilation and respiratory function stable Cardiovascular status: blood pressure returned to baseline and stable Postop Assessment: no apparent nausea or vomiting Anesthetic complications: no    Last Vitals:  Vitals:   11/11/17 1005 11/11/17 1025  BP: 118/88 (!) 143/52  Pulse: 66 63  Resp: 14 16  Temp:  36.4 C  SpO2: 94% 96%    Last Pain:  Vitals:   11/11/17 1025  TempSrc: Oral                 Catalina Gravel

## 2017-11-12 ENCOUNTER — Encounter (HOSPITAL_COMMUNITY): Payer: Self-pay | Admitting: Neurosurgery

## 2017-11-24 DIAGNOSIS — M5412 Radiculopathy, cervical region: Secondary | ICD-10-CM | POA: Diagnosis not present

## 2017-11-25 ENCOUNTER — Other Ambulatory Visit (INDEPENDENT_AMBULATORY_CARE_PROVIDER_SITE_OTHER): Payer: Self-pay | Admitting: Orthopaedic Surgery

## 2017-11-26 NOTE — Telephone Encounter (Signed)
Ok for refill? 

## 2017-11-29 DIAGNOSIS — R32 Unspecified urinary incontinence: Secondary | ICD-10-CM | POA: Diagnosis not present

## 2017-12-21 DIAGNOSIS — R32 Unspecified urinary incontinence: Secondary | ICD-10-CM | POA: Diagnosis not present

## 2018-01-10 ENCOUNTER — Telehealth: Payer: Self-pay | Admitting: *Deleted

## 2018-01-10 MED ORDER — ALENDRONATE SODIUM 70 MG PO TABS: 70.0000 mg | ORAL_TABLET | ORAL | 11 refills | Status: DC

## 2018-01-10 NOTE — Telephone Encounter (Signed)
Last labs 02/10/16 and last office visit 09/02/17.  Okay to fill?

## 2018-01-10 NOTE — Telephone Encounter (Signed)
Rx sent 

## 2018-01-10 NOTE — Telephone Encounter (Signed)
Refill for 6 months. 

## 2018-01-17 DIAGNOSIS — M4312 Spondylolisthesis, cervical region: Secondary | ICD-10-CM | POA: Diagnosis not present

## 2018-02-15 DIAGNOSIS — Z85828 Personal history of other malignant neoplasm of skin: Secondary | ICD-10-CM | POA: Diagnosis not present

## 2018-02-15 DIAGNOSIS — L308 Other specified dermatitis: Secondary | ICD-10-CM | POA: Diagnosis not present

## 2018-02-15 DIAGNOSIS — L812 Freckles: Secondary | ICD-10-CM | POA: Diagnosis not present

## 2018-02-15 DIAGNOSIS — D2272 Melanocytic nevi of left lower limb, including hip: Secondary | ICD-10-CM | POA: Diagnosis not present

## 2018-02-15 DIAGNOSIS — D2271 Melanocytic nevi of right lower limb, including hip: Secondary | ICD-10-CM | POA: Diagnosis not present

## 2018-02-15 DIAGNOSIS — D2239 Melanocytic nevi of other parts of face: Secondary | ICD-10-CM | POA: Diagnosis not present

## 2018-02-15 DIAGNOSIS — D1801 Hemangioma of skin and subcutaneous tissue: Secondary | ICD-10-CM | POA: Diagnosis not present

## 2018-02-15 DIAGNOSIS — L821 Other seborrheic keratosis: Secondary | ICD-10-CM | POA: Diagnosis not present

## 2018-02-15 DIAGNOSIS — D2262 Melanocytic nevi of left upper limb, including shoulder: Secondary | ICD-10-CM | POA: Diagnosis not present

## 2018-02-15 DIAGNOSIS — D225 Melanocytic nevi of trunk: Secondary | ICD-10-CM | POA: Diagnosis not present

## 2018-02-15 DIAGNOSIS — D2261 Melanocytic nevi of right upper limb, including shoulder: Secondary | ICD-10-CM | POA: Diagnosis not present

## 2018-02-17 ENCOUNTER — Ambulatory Visit (INDEPENDENT_AMBULATORY_CARE_PROVIDER_SITE_OTHER): Payer: PPO | Admitting: Physician Assistant

## 2018-02-17 ENCOUNTER — Ambulatory Visit (INDEPENDENT_AMBULATORY_CARE_PROVIDER_SITE_OTHER): Payer: PPO

## 2018-02-17 ENCOUNTER — Encounter (INDEPENDENT_AMBULATORY_CARE_PROVIDER_SITE_OTHER): Payer: Self-pay | Admitting: Physician Assistant

## 2018-02-17 DIAGNOSIS — M25562 Pain in left knee: Secondary | ICD-10-CM

## 2018-02-17 MED ORDER — LIDOCAINE HCL 1 % IJ SOLN
3.0000 mL | INTRAMUSCULAR | Status: AC | PRN
  Administered 2018-02-17: 3 mL

## 2018-02-17 NOTE — Progress Notes (Signed)
Office Visit Note   Patient: Julie [name]           Date of Birth: December 17, 1940           MRN: [id number] Visit Date: 02/17/2018              Requested by: Eulas Post, MD [place], [place] [address] PCP: Eulas Post, MD   Assessment & Plan: Visit Diagnoses:  1. Acute pain of left knee     Plan: We will send the aspirate off for Gram stain , culture, crystals.  She will follow-up next Tuesday to go over the results.  She is activities as tolerated.  Follow-Up Instructions: No follow-ups on file.   Orders:  Orders Placed This Encounter  Procedures  . Large Joint Inj  . Gram stain  . Anaerobic and Aerobic Culture  . XR KNEE 3 VIEW LEFT  . Cell count + diff,  w/ cryst-synvl fld   No orders of the defined types were placed in this encounter.     Procedures: Large Joint Inj: L knee on 02/17/2018 5:50 PM Indications: pain Details: 22 G 1.5 in needle, superolateral approach  Arthrogram: No  Medications: 3 mL lidocaine 1 % Aspirate: (Yellowish-orange aspirate) Outcome: tolerated well, no immediate complications Procedure, treatment alternatives, risks and benefits explained, specific risks discussed. Consent was given by the patient. Immediately prior to procedure a time out was called to verify the correct patient, procedure, equipment, support staff and site/side marked as required. Patient was prepped and draped in the usual sterile fashion.       Clinical Data: No additional findings.   Subjective: Chief Complaint  Patient presents with  . Left Knee - Pain    HPI Julie [name] is well-known to Dr. Ninfa Linden service comes in today due to left knee pain that started yesterday.  No known injury.  She status post left total knee arthroplasty 2014.  She done well until recently.  She did have pigmented villonodular synovitis left knee at the time of surgery.  She denies any recent fevers chills upper respiratory infections or  wounds. Review of Systems Please see HPI otherwise negative  Objective: Vital Signs: There were no vitals taken for this visit.  Physical Exam  Constitutional: She is oriented to person, place, and time. She appears well-developed and well-nourished. No distress.  Pulmonary/Chest: Effort normal.  Neurological: She is alert and oriented to person, place, and time.  Skin: She is not diaphoretic.  Psychiatric: She has a normal mood and affect.    Ortho Exam Left knee she has tenderness along medial lateral joint line.  No instability valgus varus stressing anterior drawer slight laxity.  She has full extension and flexion well beyond 100 degrees.  Positive effusion.  Surgical incisions well-healed no signs of wound breakdown.  No drainage from  the knee.  Slight warmth compared to the right knee.  No erythema left knee. Specialty Comments:  No specialty comments available.  Imaging: Xr Knee 3 View Left  Result Date: 02/17/2018  AP lateral and sunrise view of the left knee: No acute fracture.  Well-seated total knee components without any signs of infection.  Is well located.    PMFS History: Patient Active Problem List   Diagnosis Date Noted  . Spondylolisthesis of cervicothoracic region 11/11/2017  . Depression, recurrent ([place]) 09/20/2017  . Lumbar scoliosis 01/22/2017  . 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 Adjustment disorder with depressed mood 03/16/2016  .  Pain in joint, ankle and foot 02/11/2015  . Postoperative state 03/07/2014  . Preventative health care 10/30/2013  . Prediabetes 03/19/2013  . Osteoporosis 01/30/2013  . Postoperative anemia due to acute blood loss 01/30/2013  . Diarrhea 01/30/2013  . Pigmented villonodular synovitis of left knee 12/30/2012  . Acute meniscal tear of knee 10/21/2012  . Chronic insomnia 09/05/2012  . Gout 09/05/2012  . Right shoulder pain 10/23/2011  . Hip pain, left 10/23/2011  . 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  CONSTIPATION 05/14/2009  . COLONIC POLYPS, HX OF 05/13/2009  . FATIGUE 02/11/2009  . DERMATITIS 03/05/2008  . Essential hypertension 10/10/2007  . FOOT PAIN, RIGHT 10/10/2007  . UNSPECIFIED DISORDER OF THYROID 10/03/2007  . OTHER ABNORMAL GLUCOSE 10/03/2007  . Hypothyroidism 09/17/2007  . HYPERCHOLESTEROLEMIA 09/17/2007  . Primary osteoarthritis of both hands 09/17/2007  . BURSITIS 09/17/2007  . BREAST IMPLANTS, BILATERAL, HX OF 09/17/2007  . LAMINECTOMY, LUMBAR, HX OF 09/17/2007   Past Medical History:  Diagnosis Date  . Arthritis    lower back shoulder  . Arthropathy, unspecified, site unspecified   . Cancer ([place])    skin -spots on face removed  . Contact dermatitis and other eczema, due to unspecified cause    History  . Depression   . GERD (gastroesophageal reflux disease)    history - no current prob - no med  . Hypertension    was taking lisinopril but had an allergic reaction to medicaion  . Neuromuscular disorder ([place])    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  . Other bursitis disorders    history  . Personal history of colonic polyps   . PONV (postoperative nausea and vomiting)   . Pre-diabetes   . Pure hypercholesterolemia   . Spondylolisthesis, cervical region   . SVD (spontaneous vaginal delivery)    x 1  . Unspecified disorder of thyroid   . Unspecified hypothyroidism     Family History  Problem Relation Age of Onset  . Cancer Mother        breast and lung  . Stroke Father   . Heart attack Father   . Diabetes Sister   . Emphysema Maternal Aunt   . Colon cancer Neg Hx     Past Surgical History:  Procedure Laterality Date  . ABDOMINAL HYSTERECTOMY    . ANTERIOR CERVICAL DECOMP/DISCECTOMY FUSION N/A 11/11/2017   Procedure: Cervical seven - Thoracic one  Anterior cervical decompression/discectomy/fusion;  Surgeon: Erline Levine, MD;  Location: [facility];  Service: Neurosurgery;  Laterality: N/A;  . ANTERIOR LAT LUMBAR FUSION Left 01/22/2017     Procedure: Left Lumbar three-four, Lumbar four-five  Anterior lateral lumbar interbody fusion;  Surgeon: Erline Levine, MD;  Location: [facility];  Service: Neurosurgery;  Laterality: Left;  . BACK SURGERY  2002   L4/L5  . BLADDER SUSPENSION N/A 03/07/2014   Procedure: TRANSVAGINAL TAPE (TVT) PROCEDURE WITH CYSTO;  Surgeon: Princess Bruins, MD;  Location: [facility];  Service: Gynecology;  Laterality: N/A;  vaginal   . blephorplasty      bilateral   . BREAST SURGERY     x 2 breast implants, later removed  . COLONOSCOPY    . CYSTOCELE REPAIR N/A 03/07/2014   Procedure: ANTERIOR REPAIR (CYSTOCELE);  Surgeon: Princess Bruins, MD;  Location: [facility];  Service: Gynecology;  Laterality: N/A;  . devaited septum surgery  1975  . EYE SURGERY     bilateral cataract surgery  . KNEE ARTHROSCOPY  10/21/2012  right - Procedure: ARTHROSCOPY KNEE;  Surgeon: Mcarthur Rossetti, MD;  Location: [facility];  Service: Orthopedics;  Laterality: Left;  Left Knee Arthroscopy, Debridement, partial synovectomy  . LUMBAR LAMINECTOMY    . LUMBAR PERCUTANEOUS PEDICLE SCREW 2 LEVEL N/A 01/22/2017   Procedure: Lumbar Percutaneous Pedicle Screw Placement Lumbar three-four, Lumbar four-five;  Surgeon: Erline Levine, MD;  Location: [facility];  Service: Neurosurgery;  Laterality: N/A;  . ROBOTIC ASSISTED LAPAROSCOPIC SACROCOLPOPEXY N/A 03/07/2014   Procedure: ROBOTIC ASSISTED LAPAROSCOPIC SACROCOLPOPEXY With Carrolyn Meiers;  Surgeon: Princess Bruins, MD;  Location: [facility];  Service: Gynecology;  Laterality: N/A;  . ROBOTIC ASSISTED SUPRACERVICAL HYSTERECTOMY WITH BILATERAL SALPINGO OOPHERECTOMY N/A 03/07/2014   Procedure: ROBOTIC ASSISTED SUPRACERVICAL HYSTERECTOMY WITH BILATERAL SALPINGO OOPHORECTOMY;  Surgeon: Princess Bruins, MD;  Location: [facility];  Service: Gynecology;  Laterality: N/A;  4 hrs.  . TONSILLECTOMY    . TOTAL KNEE ARTHROPLASTY Left 12/30/2012   Procedure: TOTAL KNEE ARTHROPLASTY;  Surgeon: Mcarthur Rossetti, MD;  Location:  [facility];  Service: Orthopedics;  Laterality: Left;  Left Total Knee Arthroplasty  . TUBAL LIGATION    . WISDOM TOOTH EXTRACTION     Social History   Occupational History  . Not on file  Tobacco Use  . Smoking status: Never Smoker  . Smokeless tobacco: Never Used  Substance and Sexual Activity  . Alcohol use: No  . Drug use: No  . Sexual activity: Yes    Birth control/protection: Post-menopausal

## 2018-02-18 LAB — SYNOVIAL CELL COUNT + DIFF, W/ CRYSTALS
Basophils, %: 0 %
EOSINOPHILS-SYNOVIAL: 0 % (ref 0–2)
Lymphocytes-Synovial Fld: 0 % (ref 0–74)
Monocyte/Macrophage: 7 % (ref 0–69)
NEUTROPHIL, SYNOVIAL: 93 % — AB (ref 0–24)
Synoviocytes, %: 0 % (ref 0–15)
WBC, SYNOVIAL: 40950 {cells}/uL — AB (ref <150)

## 2018-02-21 DIAGNOSIS — H109 Unspecified conjunctivitis: Secondary | ICD-10-CM | POA: Diagnosis not present

## 2018-02-21 DIAGNOSIS — R05 Cough: Secondary | ICD-10-CM | POA: Diagnosis not present

## 2018-02-21 DIAGNOSIS — J988 Other specified respiratory disorders: Secondary | ICD-10-CM | POA: Diagnosis not present

## 2018-02-22 ENCOUNTER — Encounter (INDEPENDENT_AMBULATORY_CARE_PROVIDER_SITE_OTHER): Payer: Self-pay | Admitting: Orthopaedic Surgery

## 2018-02-22 ENCOUNTER — Ambulatory Visit (INDEPENDENT_AMBULATORY_CARE_PROVIDER_SITE_OTHER): Payer: PPO | Admitting: Orthopaedic Surgery

## 2018-02-22 DIAGNOSIS — M25462 Effusion, left knee: Secondary | ICD-10-CM

## 2018-02-22 DIAGNOSIS — Z96652 Presence of left artificial knee joint: Secondary | ICD-10-CM

## 2018-02-22 NOTE — Progress Notes (Signed)
The patient comes in today a week after having an acute effusion on her left knee that she woke up with.  She has a history of a left total knee arthroplasty that was done 5 years ago.  She has never had any issues with that knee until the day she woke up with a large effusion.  The aspirated fluid off of the knee that was blood-tinged.  Her previous diagnosis before knee replacement was pigmented villonodular synovitis.  She says since the fluid was taken off she has had no recurrence of pain in that knee or any recurrence of an effusion or fluid.  She has no pain with her left knee and is doing well overall.  She had had a recent upper respiratory infection and did receive a steroid shot in her gluteus area yesterday.  She is walking without a limp.  Her left knee is not warm at all.  There is no redness.  Her range of motion is pain-free and full.  The labs were reviewed with her from the aspiration of her left knee and it showed no organisms but 40,000 white cells.  There were no crystals.  Of note the fluid was orange tinged when we took it out which can be consistent with residual pigmented villonodular synovitis which is usually arises from the synovium.  I shared with her knee results.  Right now when I can do anything since she is completely asymptomatic with no effusion.  I would like to see her back in a month to see how she is doing overall.  Obviously if things worsen in any way before then she will let us know.  All questions and concerns were answered and addressed.

## 2018-02-23 LAB — ANAEROBIC AND AEROBIC CULTURE
AER RESULT:: NO GROWTH
MICRO NUMBER:: 90478393
MICRO NUMBER:: 90478394
SPECIMEN QUALITY: ADEQUATE
SPECIMEN QUALITY:: ADEQUATE

## 2018-02-27 ENCOUNTER — Other Ambulatory Visit: Payer: Self-pay | Admitting: Family Medicine

## 2018-03-14 ENCOUNTER — Ambulatory Visit: Payer: Self-pay

## 2018-03-14 ENCOUNTER — Emergency Department (HOSPITAL_COMMUNITY): Payer: PPO

## 2018-03-14 ENCOUNTER — Emergency Department (HOSPITAL_COMMUNITY)
Admission: EM | Admit: 2018-03-14 | Discharge: 2018-03-14 | Disposition: A | Discharge status: Home / Self Care | Payer: PPO | Attending: Emergency Medicine | Admitting: Emergency Medicine

## 2018-03-14 ENCOUNTER — Other Ambulatory Visit: Payer: Self-pay

## 2018-03-14 ENCOUNTER — Encounter (HOSPITAL_COMMUNITY): Payer: Self-pay | Admitting: Emergency Medicine

## 2018-03-14 DIAGNOSIS — R079 Chest pain, unspecified: Secondary | ICD-10-CM | POA: Diagnosis not present

## 2018-03-14 DIAGNOSIS — Z96652 Presence of left artificial knee joint: Secondary | ICD-10-CM | POA: Insufficient documentation

## 2018-03-14 DIAGNOSIS — Z79899 Other long term (current) drug therapy: Secondary | ICD-10-CM | POA: Diagnosis not present

## 2018-03-14 DIAGNOSIS — I10 Essential (primary) hypertension: Secondary | ICD-10-CM | POA: Diagnosis not present

## 2018-03-14 DIAGNOSIS — E039 Hypothyroidism, unspecified: Secondary | ICD-10-CM | POA: Diagnosis not present

## 2018-03-14 DIAGNOSIS — R0789 Other chest pain: Secondary | ICD-10-CM | POA: Diagnosis not present

## 2018-03-14 DIAGNOSIS — J9801 Acute bronchospasm: Secondary | ICD-10-CM | POA: Diagnosis not present

## 2018-03-14 DIAGNOSIS — Z85828 Personal history of other malignant neoplasm of skin: Secondary | ICD-10-CM | POA: Diagnosis not present

## 2018-03-14 DIAGNOSIS — R0602 Shortness of breath: Secondary | ICD-10-CM | POA: Insufficient documentation

## 2018-03-14 LAB — CBC
HCT: 38 % (ref 36.0–46.0)
Hemoglobin: 12.1 g/dL (ref 12.0–15.0)
MCH: 28.7 pg (ref 26.0–34.0)
MCHC: 31.8 g/dL (ref 30.0–36.0)
MCV: 90.3 fL (ref 78.0–100.0)
Platelets: 250 10*3/uL (ref 150–400)
RBC: 4.21 MIL/uL (ref 3.87–5.11)
RDW: 14.2 % (ref 11.5–15.5)
WBC: 7.8 10*3/uL (ref 4.0–10.5)

## 2018-03-14 LAB — BASIC METABOLIC PANEL
Anion gap: 9 (ref 5–15)
BUN: 11 mg/dL (ref 6–20)
CO2: 24 mmol/L (ref 22–32)
Calcium: 9.1 mg/dL (ref 8.9–10.3)
Chloride: 107 mmol/L (ref 101–111)
Creatinine, Ser: 0.55 mg/dL (ref 0.44–1.00)
GFR calc Af Amer: >60 mL/min (ref >60)
GFR calc non Af Amer: >60 mL/min (ref >60)
Glucose, Bld: 100 mg/dL — ABNORMAL HIGH (ref 65–99)
Potassium: 4.1 mmol/L (ref 3.5–5.1)
Sodium: 140 mmol/L (ref 135–145)

## 2018-03-14 LAB — BRAIN NATRIURETIC PEPTIDE: B Natriuretic Peptide: 22.4 pg/mL (ref 0.0–100.0)

## 2018-03-14 LAB — I-STAT TROPONIN, ED
TROPONIN I, POC: 0 ng/mL (ref 0.00–0.08)
Troponin i, poc: 0 ng/mL (ref 0.00–0.08)

## 2018-03-14 MED ORDER — ALBUTEROL SULFATE HFA 108 (90 BASE) MCG/ACT IN AERS
2.0000 | INHALATION_SPRAY | RESPIRATORY_TRACT | Status: DC | PRN
  Filled 2018-03-14: qty 6.7

## 2018-03-14 MED ORDER — LORATADINE 10 MG PO TABS: 10.0000 mg | ORAL_TABLET | Freq: Every day | ORAL | 0 refills | Status: DC

## 2018-03-14 MED ORDER — PREDNISONE 20 MG PO TABS: ORAL_TABLET | ORAL | 0 refills | Status: DC

## 2018-03-14 MED ORDER — ALBUTEROL SULFATE (2.5 MG/3ML) 0.083% IN NEBU
5.0000 mg | INHALATION_SOLUTION | Freq: Once | RESPIRATORY_TRACT | Status: AC
  Administered 2018-03-14: 5 mg via RESPIRATORY_TRACT
  Filled 2018-03-14: qty 6

## 2018-03-14 NOTE — ED Notes (Signed)
Unable to obtain istat troponin, requesting tech to obtain

## 2018-03-14 NOTE — ED Triage Notes (Signed)
Pt complaint of central chest tightness since Friday; constant in nature; denies other.

## 2018-03-14 NOTE — Telephone Encounter (Signed)
Pt has arrived [facility] ED.  

## 2018-03-14 NOTE — ED Notes (Signed)
3 hour troponin order placed per verbal.

## 2018-03-14 NOTE — ED Provider Notes (Signed)
[facility] DEPT Provider Note   CSN: [id number] Arrival date & time: 03/14/18  1218     History   Chief Complaint Chief Complaint  Patient presents with  . Chest Pain    HPI Julie [name] is a 77 y.o. female.  HPI Patient presents with central chest tightness, shortness of breath, nonproductive cough for the past 3 days.  Denies any fever or chills.  Denies abdominal pain, nausea or vomiting.  States the chest pain does not radiate.  Not worse with palpation but does worsen with deep breathing.  No new lower extremity swelling or pain. Past Medical History:  Diagnosis Date  . Arthritis    lower back shoulder  . Arthropathy, unspecified, site unspecified   . Cancer ([facility])    skin -spots on face removed  . Contact dermatitis and other eczema, due to unspecified cause    History  . Depression   . GERD (gastroesophageal reflux disease)    history - no current prob - no med  . Hypertension    was taking lisinopril but had an allergic reaction to medicaion  . 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 Other bursitis disorders    history  . Personal history of colonic polyps   . PONV (postoperative nausea and vomiting)   . Pre-diabetes   . Pure hypercholesterolemia   . Spondylolisthesis, cervical region   . SVD (spontaneous vaginal delivery)    x 1  . Unspecified disorder of thyroid   . Unspecified hypothyroidism     Patient Active Problem List   Diagnosis Date Noted  . Spondylolisthesis of cervicothoracic region 11/11/2017  . Depression, recurrent ([facility]) 09/20/2017  . Lumbar scoliosis 01/22/2017  . 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 Adjustment disorder with depressed mood 03/16/2016  . Pain in joint, ankle and foot 02/11/2015  . Postoperative state 03/07/2014  . Preventative health care 10/30/2013  . Prediabetes 03/19/2013  . Osteoporosis 01/30/2013  . Postoperative anemia  due to acute blood loss 01/30/2013  . Diarrhea 01/30/2013  . Pigmented villonodular synovitis of left knee 12/30/2012  . Acute meniscal tear of knee 10/21/2012  . Chronic insomnia 09/05/2012  . Gout 09/05/2012  . Right shoulder pain 10/23/2011  . Hip pain, left 10/23/2011  . 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 CONSTIPATION 05/14/2009  . COLONIC POLYPS, HX OF 05/13/2009  . FATIGUE 02/11/2009  . DERMATITIS 03/05/2008  . Essential hypertension 10/10/2007  . FOOT PAIN, RIGHT 10/10/2007  . UNSPECIFIED DISORDER OF THYROID 10/03/2007  . OTHER ABNORMAL GLUCOSE 10/03/2007  . Hypothyroidism 09/17/2007  . HYPERCHOLESTEROLEMIA 09/17/2007  . Primary osteoarthritis of both hands 09/17/2007  . BURSITIS 09/17/2007  . BREAST IMPLANTS, BILATERAL, HX OF 09/17/2007  . LAMINECTOMY, LUMBAR, HX OF 09/17/2007    Past Surgical History:  Procedure Laterality Date  . ABDOMINAL HYSTERECTOMY    . ANTERIOR CERVICAL DECOMP/DISCECTOMY FUSION N/A 11/11/2017   Procedure: Cervical seven - Thoracic one  Anterior cervical decompression/discectomy/fusion;  Surgeon: Erline Levine, MD;  Location: [facility];  Service: Neurosurgery;  Laterality: N/A;  . ANTERIOR LAT LUMBAR FUSION Left 01/22/2017   Procedure: Left Lumbar three-four, Lumbar four-five  Anterior lateral lumbar interbody fusion;  Surgeon: Erline Levine, MD;  Location: [facility];  Service: Neurosurgery;  Laterality: Left;  . BACK SURGERY  2002   L4/L5  . BLADDER SUSPENSION N/A 03/07/2014   Procedure: TRANSVAGINAL TAPE (TVT) PROCEDURE WITH CYSTO;  Surgeon: Truddie Hidden  Dellis Filbert, MD;  Location: [facility];  Service: Gynecology;  Laterality: N/A;  vaginal   . blephorplasty      bilateral   . BREAST SURGERY     x 2 breast implants, later removed  . COLONOSCOPY    . CYSTOCELE REPAIR N/A 03/07/2014   Procedure: ANTERIOR REPAIR (CYSTOCELE);  Surgeon: Princess Bruins, MD;  Location: [facility];  Service: Gynecology;  Laterality: N/A;  . devaited septum surgery  1975  . EYE SURGERY       bilateral cataract surgery  . KNEE ARTHROSCOPY  10/21/2012   right - Procedure: ARTHROSCOPY KNEE;  Surgeon: Mcarthur Rossetti, MD;  [facility];  Service: Orthopedics;  Laterality: Left;  Left Knee Arthroscopy, Debridement, partial synovectomy  . LUMBAR LAMINECTOMY    . LUMBAR PERCUTANEOUS PEDICLE SCREW 2 LEVEL N/A 01/22/2017   Procedure: Lumbar Percutaneous Pedicle Screw Placement Lumbar three-four, Lumbar four-five;  Surgeon: Erline Levine, MD;  Location: [facility];  Service: Neurosurgery;  Laterality: N/A;  . ROBOTIC ASSISTED LAPAROSCOPIC SACROCOLPOPEXY N/A 03/07/2014   Procedure: ROBOTIC ASSISTED LAPAROSCOPIC SACROCOLPOPEXY With Carrolyn Meiers;  Surgeon: Princess Bruins, MD;  Location: [facility];  Service: Gynecology;  Laterality: N/A;  . ROBOTIC ASSISTED SUPRACERVICAL HYSTERECTOMY WITH BILATERAL SALPINGO OOPHERECTOMY N/A 03/07/2014   Procedure: ROBOTIC ASSISTED SUPRACERVICAL HYSTERECTOMY WITH BILATERAL SALPINGO OOPHORECTOMY;  Surgeon: Princess Bruins, MD;  Location: [facility];  Service: Gynecology;  Laterality: N/A;  4 hrs.  . TONSILLECTOMY    . TOTAL KNEE ARTHROPLASTY Left 12/30/2012   Procedure: TOTAL KNEE ARTHROPLASTY;  Surgeon: Mcarthur Rossetti, MD;  [facility];  Service: Orthopedics;  Laterality: Left;  Left Total Knee Arthroplasty  . TUBAL LIGATION    . WISDOM TOOTH EXTRACTION       OB History   None      Home Medications    Prior to Admission medications   Medication Sig Start Date End Date Taking? Authorizing Provider  alendronate (FOSAMAX) 70 MG tablet Take 1 tablet (70 mg total) by mouth every 7 (seven) days. Take with a full glass of water on an empty stomach. 01/10/18  Yes Burchette, Alinda Sierras, MD  Bilberry, Vaccinium myrtillus, (BILBERRY PO) Take 1,200 mg elemental calcium/kg/hr by mouth daily.   Yes [provider]  Biotin 3 MG TABS Take 1 tablet by mouth daily.   Yes [provider]  Calcium Carb-Cholecalciferol (CALCIUM 500 + D3 PO) Take 1 tablet  by mouth daily.    Yes [provider]  Cholecalciferol 5000 UNITS capsule Take 5,000 Units by mouth daily.   Yes [provider]  Cinnamon 500 MG TABS Take 1 tablet by mouth daily.   Yes [provider]  colchicine (COLCRYS) 0.6 MG tablet Take 1 tablet (0.6 mg total) by mouth 2 (two) times daily as needed. 03/09/17  Yes Burchette, Alinda Sierras, MD  Cyanocobalamin (VITAMIN B-12 PO) Take 5,000 mcg by mouth daily.   Yes [provider]  FLUoxetine (PROZAC) 20 MG tablet Take 20 mg by mouth daily. 12/15/17  Yes [provider]  gabapentin (NEURONTIN) 300 MG capsule Take 300 mg by mouth 3 (three) times daily. 01/25/18  Yes [provider]  Multiple Vitamins-Minerals (AIRBORNE) CHEW Chew 1 tablet by mouth daily.   Yes [provider]  omeprazole (PRILOSEC) 40 MG capsule TAKE ONE CAPSULE BY MOUTH EVERY DAY Patient taking differently: Take 40mg  by mouth daily 02/28/18  Yes Burchette, Alinda Sierras, MD  thyroid (ARMOUR THYROID) 30 MG tablet TAKE 1 TABLET BY MOUTH EVERY DAY.  NEED LABS FOR MORE REFILLS Patient taking differently: Take 30 mg by mouth daily before breakfast. TAKE 1 TABLET BY MOUTH EVERY DAY. NEED LABS FOR MORE REFILLS 09/21/17  Yes Burchette, Alinda Sierras, MD  TURMERIC CURCUMIN PO Take 1,000 mg by mouth daily.   Yes [provider]  VIRTUSSIN A/C 100-10 MG/5ML syrup Take 10 mLs by mouth at bedtime. 02/21/18  Yes [provider]  vitamin E 400 UNIT capsule Take 1 tablet by mouth daily.   Yes [provider]  doxycycline (ADOXA) 100 MG tablet Take 100 mg by mouth 2 (two) times daily. 02/21/18   [provider]  loratadine (CLARITIN) 10 MG tablet Take 1 tablet (10 mg total) by mouth daily. 03/14/18   Julianne Rice, MD  predniSONE (DELTASONE) 20 MG tablet 3 tabs po day one, then 2 po daily x 4 days 03/14/18   Julianne Rice, MD    Family History Family History  Problem Relation Age of Onset  . Cancer Mother         breast and lung  . Stroke Father   . Heart attack Father   . Diabetes Sister   . Emphysema Maternal Aunt   . Colon cancer Neg Hx     Social History Social History   Tobacco Use  . Smoking status: Never Smoker  . Smokeless tobacco: Never Used  Substance Use Topics  . Alcohol use: No  . Drug use: No     Allergies   Penicillins; Adhesive [tape]; Codeine; Lisinopril; Other; and Percocet [oxycodone-acetaminophen]   Review of Systems Review of Systems  Constitutional: Negative for chills and fever.  HENT: Negative for sinus pressure, sore throat and trouble swallowing.   Eyes: Negative for photophobia and visual disturbance.  Respiratory: Positive for cough, chest tightness and shortness of breath.   Cardiovascular: Negative for chest pain, palpitations and leg swelling.  Gastrointestinal: Negative for abdominal pain, constipation, diarrhea, nausea and vomiting.  Genitourinary: Negative for dysuria, flank pain and frequency.  Musculoskeletal: Negative for back pain, myalgias and neck pain.  Skin: Negative for rash and wound.  Neurological: Negative for dizziness, weakness, light-headedness, numbness and headaches.  All other systems reviewed and are negative.    Physical Exam Updated Vital Signs BP (!) 146/73 (BP Location: Left Arm)   Pulse 65   Temp 98.6 F (37 C) (Oral)   Resp 15   SpO2 97%   Physical Exam  Constitutional: She is oriented to person, place, and time. She appears well-developed and well-nourished. No distress.  HENT:  Head: Normocephalic and atraumatic.  Mouth/Throat: Oropharynx is clear and moist. No oropharyngeal exudate.  Eyes: Pupils are equal, round, and reactive to light. EOM are normal.  Neck: Normal range of motion. Neck supple. No JVD present.  Cardiovascular: Normal rate and regular rhythm. Exam reveals no gallop and no friction rub.  No murmur heard. Pulmonary/Chest: Effort normal. She has rales.  Few crackles in bilateral bases.   Patient has diminished breath sounds throughout.  No definite wheezing.  Abdominal: Soft. Bowel sounds are normal. There is no tenderness. There is no rebound and no guarding.  Musculoskeletal: Normal range of motion. She exhibits edema. She exhibits no tenderness.  Patient with very mild bilateral lower extremity edema.  Distal pulses are 2+.  No calf tenderness or asymmetry.  Lymphadenopathy:    She has no cervical adenopathy.  Neurological: She is alert and oriented to person, place, and time.  Moves all extremities without focal deficit.  Sensation fully intact.  Skin: Skin is warm and dry. Capillary refill takes less than 2 seconds. No rash noted. She is not diaphoretic. No erythema.  Psychiatric: She has a normal mood and affect. Her behavior is normal.  Nursing note and vitals reviewed.    ED Treatments / Results  Labs (all labs ordered are listed, but only abnormal results are displayed) Labs Reviewed  BASIC METABOLIC PANEL - Abnormal; Notable for the following components:      Result Value   Glucose, Bld 100 (*)    All other components within normal limits  CBC  BRAIN NATRIURETIC PEPTIDE  I-STAT TROPONIN, ED  I-STAT TROPONIN, ED    EKG EKG Interpretation  Date/Time:  Monday Mar 14 2018 12:26:06 EDT Ventricular Rate:  74 PR Interval:  192 QRS Duration: 78 QT Interval:  384 QTC Calculation: 426 R Axis:   45 Text Interpretation:  Normal sinus rhythm Normal ECG Confirmed by Julianne Rice [phone]) on 03/14/2018 4:36:34 PM   Radiology Dg Chest 2 View  Result Date: 03/14/2018 CLINICAL DATA:  Chest tightness EXAM: CHEST - 2 VIEW COMPARISON:  10/18/2012 FINDINGS: The heart size and mediastinal contours are within normal limits. Both lungs are clear. The visualized skeletal structures are unremarkable. IMPRESSION: No active cardiopulmonary disease. Electronically Signed   By: Kathreen Devoid   On: 03/14/2018 12:50    Procedures Procedures (including critical care  time)  Medications Ordered in ED Medications  albuterol (PROVENTIL HFA;VENTOLIN HFA) 108 (90 Base) MCG/ACT inhaler 2 puff (has no administration in time range)  albuterol (PROVENTIL) (2.5 MG/3ML) 0.083% nebulizer solution 5 mg (5 mg Nebulization Given 03/14/18 1913)     Initial Impression / Assessment and Plan / ED Course  I have reviewed the triage vital signs and the nursing notes.  Pertinent labs & imaging results that were available during my care of the patient were reviewed by me and considered in my medical decision making (see chart for details).    EKG without ischemic changes.  Troponin x2 is normal.  Patient's chest pain is atypical for coronary artery disease.  Given albuterol treatment and states that tightness and shortness of breath has significantly improved.  Better air movement.  Likely bronchospasm.  Will give short course of prednisone and inhaler.  Encouraged to follow-up with both her primary physician and cardiology.  Return precautions given.   Final Clinical Impressions(s) / ED Diagnoses   Final diagnoses:  Nonspecific chest pain  Bronchospasm    ED Discharge Orders        Ordered    loratadine (CLARITIN) 10 MG tablet  Daily     03/14/18 1942    predniSONE (DELTASONE) 20 MG tablet     03/14/18 1942       Julianne Rice, MD 03/14/18 1945

## 2018-03-14 NOTE — Telephone Encounter (Signed)
Patient called in with c/o "chest tightness." She says "it started on Friday and I drank some Apple Cider Vinegar thinking I needed to burp. The tightness stayed and is still there today. I'm also having difficulty breathing." I could hear the patient taking deep breaths on the phone during the triage call between sentences. I advised the patient to go to the ED, she says "I don't want to go there, because it takes so long." I advised that the provider would agree she needs to be evaluated in the ED due to her symptoms of difficulty breathing and chest tightness since Friday with no relief. I advised in the office and EKG would be done, but the ED would do more than an EKG and would determine if it's the heart or something else. She says "I will wait until my husband comes back from his errands and have him to take me there." Care advice given, patient verbalized understanding.  Reason for Disposition . Chest pain lasts > 5 minutes (Exceptions: chest pain occurring > 3 days ago and now asymptomatic; same as previously diagnosed heartburn and has accompanying sour taste in mouth)  Answer Assessment - Initial Assessment Questions 1. LOCATION: "Where does it hurt?"       All across, mainly in the middle 2. RADIATION: "Does the pain go anywhere else?" (e.g., into neck, jaw, arms, back)     No 3. ONSET: "When did the chest pain begin?" (Minutes, hours or days)      Friday-tightness 4. PATTERN "Does the pain come and go, or has it been constant since it started?"  "Does it get worse with exertion?"      Constant 5. DURATION: "How long does it last" (e.g., seconds, minutes, hours)     Constant 6. SEVERITY: "How bad is the pain?"  (e.g., Scale 1-10; mild, moderate, or severe)    - MILD (1-3): doesn't interfere with normal activities     - MODERATE (4-7): interferes with normal activities or awakens from sleep    - SEVERE (8-10): excruciating pain, unable to do any normal activities       9 7. CARDIAC  RISK FACTORS: "Do you have any history of heart problems or risk factors for heart disease?" (e.g., prior heart attack, angina; high blood pressure, diabetes, being overweight, high cholesterol, smoking, or strong family history of heart disease)     Borderline diabetes 8. PULMONARY RISK FACTORS: "Do you have any history of lung disease?"  (e.g., blood clots in lung, asthma, emphysema, birth control pills)     No 9. CAUSE: "What do you think is causing the chest pain?"     No idea 10. OTHER SYMPTOMS: "Do you have any other symptoms?" (e.g., dizziness, nausea, vomiting, sweating, fever, difficulty breathing, cough)       Difficulty breathing, cough 11. PREGNANCY: "Is there any chance you are pregnant?" "When was your last menstrual period?"       No  Protocols used: CHEST PAIN-A-AH

## 2018-03-21 ENCOUNTER — Encounter: Payer: Self-pay | Admitting: Family Medicine

## 2018-03-21 ENCOUNTER — Ambulatory Visit (INDEPENDENT_AMBULATORY_CARE_PROVIDER_SITE_OTHER): Payer: PPO | Admitting: Family Medicine

## 2018-03-21 VITALS — BP 130/84 | HR 67 | Temp 98.3°F | Wt 134.0 lb

## 2018-03-21 DIAGNOSIS — R059 Cough, unspecified: Secondary | ICD-10-CM

## 2018-03-21 DIAGNOSIS — R7303 Prediabetes: Secondary | ICD-10-CM | POA: Diagnosis not present

## 2018-03-21 DIAGNOSIS — R05 Cough: Secondary | ICD-10-CM | POA: Diagnosis not present

## 2018-03-21 DIAGNOSIS — I10 Essential (primary) hypertension: Secondary | ICD-10-CM | POA: Diagnosis not present

## 2018-03-21 DIAGNOSIS — Z9181 History of falling: Secondary | ICD-10-CM | POA: Diagnosis not present

## 2018-03-21 LAB — POCT GLYCOSYLATED HEMOGLOBIN (HGB A1C): HEMOGLOBIN A1C: 5.8

## 2018-03-21 NOTE — Progress Notes (Signed)
Subjective:     Patient ID: Julie [name], female   DOB: 1941/03/21, 77 y.o.   MRN: [id number]  HPI Seen for the following  ER follow-up. She had called in with complaints of some chest tightness and some difficulty breathing. She was advised ago the ER. She was seen there and had some shortness of breath and nonproductive cough for 3 days. No fever. Chest x-ray unremarkable. Troponins negative. Was noted to have some diminished breath sounds throughout but no definite wheezing. She apparently received albuterol in the ER and felt better afterwards. She was treated with 4 days of prednisone and states  symptoms have fully resolved at this time.   She's never had exertional chest pain. She was advised to consider follow-up with cardiology but she declines at this time. She's had no further chest pain whatsoever. EKG was unremarkable.  Hypertension which has been stable. Occasional lightheadedness but not consistently. Her major concern is that she states her balance is "off". She has not had any recent falls. She feels unbalanced walking and is fearful falling. She is joining the [employer] and plans to set up to see a trainer there. She declines physical therapy at this time.  History of prediabetes. Not monitoring blood sugars. No polyuria or polydipsia.  Past Medical History:  Diagnosis Date  . Arthritis    lower back shoulder  . Arthropathy, unspecified, site unspecified   . Cancer ([place])    skin -spots on face removed  . Contact dermatitis and other eczema, due to unspecified cause    History  . Depression   . GERD (gastroesophageal reflux disease)    history - no current prob - no med  . Hypertension    was taking lisinopril but had an allergic reaction to medicaion  . 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 Other bursitis disorders    history  . Personal history of colonic polyps   . PONV (postoperative nausea and vomiting)   .  Pre-diabetes   . Pure hypercholesterolemia   . Spondylolisthesis, cervical region   . SVD (spontaneous vaginal delivery)    x 1  . Unspecified disorder of thyroid   . Unspecified hypothyroidism    Past Surgical History:  Procedure Laterality Date  . ABDOMINAL HYSTERECTOMY    . ANTERIOR CERVICAL DECOMP/DISCECTOMY FUSION N/A 11/11/2017   Procedure: Cervical seven - Thoracic one  Anterior cervical decompression/discectomy/fusion;  Surgeon: Erline Levine, MD;  Location: [facility];  Service: Neurosurgery;  Laterality: N/A;  . ANTERIOR LAT LUMBAR FUSION Left 01/22/2017   Procedure: Left Lumbar three-four, Lumbar four-five  Anterior lateral lumbar interbody fusion;  Surgeon: Erline Levine, MD;  Location: [facility];  Service: Neurosurgery;  Laterality: Left;  . BACK SURGERY  2002   L4/L5  . BLADDER SUSPENSION N/A 03/07/2014   Procedure: TRANSVAGINAL TAPE (TVT) PROCEDURE WITH CYSTO;  Surgeon: Princess Bruins, MD;  Location: [facility];  Service: Gynecology;  Laterality: N/A;  vaginal   . blephorplasty      bilateral   . BREAST SURGERY     x 2 breast implants, later removed  . COLONOSCOPY    . CYSTOCELE REPAIR N/A 03/07/2014   Procedure: ANTERIOR REPAIR (CYSTOCELE);  Surgeon: Princess Bruins, MD;  Location: [facility];  Service: Gynecology;  Laterality: N/A;  . devaited septum surgery  1975  . EYE SURGERY     bilateral cataract surgery  . KNEE ARTHROSCOPY  10/21/2012   right -  Procedure: ARTHROSCOPY KNEE;  Surgeon: Mcarthur Rossetti, MD;  Location: [facility];  Service: Orthopedics;  Laterality: Left;  Left Knee Arthroscopy, Debridement, partial synovectomy  . LUMBAR LAMINECTOMY    . LUMBAR PERCUTANEOUS PEDICLE SCREW 2 LEVEL N/A 01/22/2017   Procedure: Lumbar Percutaneous Pedicle Screw Placement Lumbar three-four, Lumbar four-five;  Surgeon: Erline Levine, MD;  Location: [facility];  Service: Neurosurgery;  Laterality: N/A;  . ROBOTIC ASSISTED LAPAROSCOPIC SACROCOLPOPEXY N/A 03/07/2014   Procedure: ROBOTIC ASSISTED  LAPAROSCOPIC SACROCOLPOPEXY With Carrolyn Meiers;  Surgeon: Princess Bruins, MD;  Location: [facility];  Service: Gynecology;  Laterality: N/A;  . ROBOTIC ASSISTED SUPRACERVICAL HYSTERECTOMY WITH BILATERAL SALPINGO OOPHERECTOMY N/A 03/07/2014   Procedure: ROBOTIC ASSISTED SUPRACERVICAL HYSTERECTOMY WITH BILATERAL SALPINGO OOPHORECTOMY;  Surgeon: Princess Bruins, MD;  Location: [facility];  Service: Gynecology;  Laterality: N/A;  4 hrs.  . TONSILLECTOMY    . TOTAL KNEE ARTHROPLASTY Left 12/30/2012   Procedure: TOTAL KNEE ARTHROPLASTY;  Surgeon: Mcarthur Rossetti, MD;  Location: [facility];  Service: Orthopedics;  Laterality: Left;  Left Total Knee Arthroplasty  . TUBAL LIGATION    . WISDOM TOOTH EXTRACTION      reports that she has never smoked. She has never used smokeless tobacco. She reports that she does not drink alcohol or use drugs. family history includes Cancer in her mother; Diabetes in her sister; Emphysema in her maternal aunt; Heart attack in her father; Stroke in her father. Allergies  Allergen Reactions  . Penicillins Anaphylaxis    REACTION: causes tongue to swell Has patient had a PCN reaction causing immediate rash, facial/tongue/throat swelling, SOB or lightheadedness with hypotension: Yes swelling Has patient had a PCN reaction causing severe rash involving mucus membranes or skin necrosis:  Has patient had a PCN reaction that required hospitalization no Has patient had a PCN reaction occurring within the last 10 years: no If all of the above answers are "NO", then may proceed with Cephalosporin use.   . Adhesive [Tape] Other (See Comments)    Please use paper tape  . Codeine Nausea Only  . Lisinopril     REACTION: wheezing  . Other Other (See Comments)    Pt was having knee surgery and was given an epidural, which did not work.  [name] [Oxycodone-Acetaminophen] Nausea And Vomiting     Review of Systems  Constitutional: Negative for fatigue and unexpected weight change.   Eyes: Negative for visual disturbance.  Respiratory: Negative for cough, chest tightness, shortness of breath and wheezing.   Cardiovascular: Negative for chest pain, palpitations and leg swelling.  Endocrine: Negative for polydipsia and polyuria.  Genitourinary: Negative for dysuria.  Neurological: Positive for light-headedness. Negative for seizures, syncope, weakness and headaches.       Objective:   Physical Exam  Constitutional: She appears well-developed and well-nourished.  Eyes: Pupils are equal, round, and reactive to light.  Neck: Neck supple. No JVD present. No thyromegaly present.  Cardiovascular: Normal rate and regular rhythm. Exam reveals no gallop.  Pulmonary/Chest: Effort normal and breath sounds normal. No respiratory distress. She has no wheezes. She has no rales.  Musculoskeletal: She exhibits no edema.  Neurological: She is alert.       Assessment:     #1 history of prediabetes. Stable with A1c today 5.8%  #2 hypertension stable. Standing blood pressure 138/80  #3 recent complaints of cough and shortness of breath resolved following albuterol and four-day course of prednisone. She's had no further chest tightness since using the inhaler and prednisone  #  4 at risk for falls.    Plan:     -We recommended physical therapy to reduce fall risk. She plans to explore options of some exercises at her current gym first working with a trainer -Follow-up immediately for any recurrent cough, wheezing, or other concerns -Continue to avoid high glycemic foods -Routine follow-up in 3 months and sooner as needed  Eulas Post MD [facility] at [facility]

## 2018-03-21 NOTE — Patient Instructions (Signed)
Fall Prevention in the Home Falls can cause injuries and can affect people from all age groups. There are many simple things that you can do to make your home safe and to help prevent falls. What can I do on the outside of my home?  Regularly repair the edges of walkways and driveways and fix any cracks.  Remove high doorway thresholds.  Trim any shrubbery on the main path into your home.  Use bright outdoor lighting.  Clear walkways of debris and clutter, including tools and rocks.  Regularly check that handrails are securely fastened and in good repair. Both sides of any steps should have handrails.  Install guardrails along the edges of any raised decks or porches.  Have leaves, snow, and ice cleared regularly.  Use sand or salt on walkways during winter months.  In the garage, clean up any spills right away, including grease or oil spills. What can I do in the bathroom?  Use night lights.  Install grab bars by the toilet and in the tub and shower. Do not use towel bars as grab bars.  Use non-skid mats or decals on the floor of the tub or shower.  If you need to sit down while you are in the shower, use a plastic, non-slip stool.  Keep the floor dry. Immediately clean up any water that spills on the floor.  Remove soap buildup in the tub or shower on a regular basis.  Attach bath mats securely with double-sided non-slip rug tape.  Remove throw rugs and other tripping hazards from the floor. What can I do in the bedroom?  Use night lights.  Make sure that a bedside light is easy to reach.  Do not use oversized bedding that drapes onto the floor.  Have a firm chair that has side arms to use for getting dressed.  Remove throw rugs and other tripping hazards from the floor. What can I do in the kitchen?  Clean up any spills right away.  Avoid walking on wet floors.  Place frequently used items in easy-to-reach places.  If you need to reach for something above  you, use a sturdy step stool that has a grab bar.  Keep electrical cables out of the way.  Do not use floor polish or wax that makes floors slippery. If you have to use wax, make sure that it is non-skid floor wax.  Remove throw rugs and other tripping hazards from the floor. What can I do in the stairways?  Do not leave any items on the stairs.  Make sure that there are handrails on both sides of the stairs. Fix handrails that are broken or loose. Make sure that handrails are as long as the stairways.  Check any carpeting to make sure that it is firmly attached to the stairs. Fix any carpet that is loose or worn.  Avoid having throw rugs at the top or bottom of stairways, or secure the rugs with carpet tape to prevent them from moving.  Make sure that you have a light switch at the top of the stairs and the bottom of the stairs. If you do not have them, have them installed. What are some other fall prevention tips?  Wear closed-toe shoes that fit well and support your feet. Wear shoes that have rubber soles or low heels.  When you use a stepladder, make sure that it is completely opened and that the sides are firmly locked. Have someone hold the ladder while you are using   it. Do not climb a closed stepladder.  Add color or contrast paint or tape to grab bars and handrails in your home. Place contrasting color strips on the first and last steps.  Use mobility aids as needed, such as canes, walkers, scooters, and crutches.  Turn on lights if it is dark. Replace any light bulbs that burn out.  Set up furniture so that there are clear paths. Keep the furniture in the same spot.  Fix any uneven floor surfaces.  Choose a carpet design that does not hide the edge of steps of a stairway.  Be aware of any and all pets.  Review your medicines with your healthcare provider. Some medicines can cause dizziness or changes in blood pressure, which increase your risk of falling. Talk with  your health care provider about other ways that you can decrease your risk of falls. This may include working with a physical therapist or trainer to improve your strength, balance, and endurance. This information is not intended to replace advice given to you by your health care provider. Make sure you discuss any questions you have with your health care provider. Document Released: 10/09/2002 Document Revised: 03/17/2016 Document Reviewed: 11/23/2014 Elsevier Interactive Patient Education  2018 [employer]  

## 2018-03-22 ENCOUNTER — Encounter (INDEPENDENT_AMBULATORY_CARE_PROVIDER_SITE_OTHER): Payer: Self-pay | Admitting: Orthopaedic Surgery

## 2018-03-22 ENCOUNTER — Ambulatory Visit (INDEPENDENT_AMBULATORY_CARE_PROVIDER_SITE_OTHER): Payer: PPO | Admitting: Orthopaedic Surgery

## 2018-03-22 DIAGNOSIS — Z96652 Presence of left artificial knee joint: Secondary | ICD-10-CM | POA: Diagnosis not present

## 2018-03-22 NOTE — Progress Notes (Signed)
The patient is returning for follow-up so I can examine her left knee.  She had a left total knee arthroplasty in 2014.  She done well for long period time until I saw her earlier this year with a left knee effusion.  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  I felt that this may be a recurrence.  However since then now seen her twice and she is had no recurrence of any fluid in her knee.  She is had no swelling and no complaints of pain or issues with the knee at all.  On examination of the left knee her incisions well-healed.  There is no redness.  There is no fullness to the knee and no effusion.  Her range of motion is full and the knee feels ligamentously stable.  At this point but is can watch this conservatively since she is doing so well.  All questions concerns were answered and addressed.  If she does get a recurrent effusion on call and come back and see [place].

## 2018-05-26 ENCOUNTER — Other Ambulatory Visit: Payer: Self-pay | Admitting: Family Medicine

## 2018-06-02 ENCOUNTER — Ambulatory Visit: Payer: PPO

## 2018-06-03 ENCOUNTER — Ambulatory Visit: Payer: PPO

## 2018-06-14 ENCOUNTER — Ambulatory Visit (INDEPENDENT_AMBULATORY_CARE_PROVIDER_SITE_OTHER): Payer: PPO

## 2018-06-14 VITALS — BP 136/80 | HR 70 | Ht 61.0 in | Wt 141.4 lb

## 2018-06-14 DIAGNOSIS — Z0000 Encounter for general adult medical examination without abnormal findings: Secondary | ICD-10-CM

## 2018-06-14 NOTE — Progress Notes (Addendum)
Subjective:   Julie [name] is a 77 y.o. female who presents for Medicare Annual (Subsequent) preventive examination.  Reports health as good  Sleep 5 hours Goes to bed 10:30 to 11 and gets up at 8am and gets up to go to the bathroom  Drinks a lot of water  Tried melatonin  Reviewed sleep hygiene.   Worked as a  Scientist, [employer] and now still works 3 days a week  Spouse is a Midwife a shop x 36 years;     Diet BMI 25  Chol ratio 5; hdl 42;  A1c 5.8  Loves bread and sweets; snacks Has always has a weight problem but BMI 26   Discussed incorporating beans and fiber     Exercise Walk   Eye Exam- Last exam > 1 year. Dr. Norlene Duel exam > 1 year. Full dentures top, partials bottom.  Hearing normal   Health Maintenance Due  Topic Date Due  . FOOT EXAM  10/22/1951  . URINE MICROALBUMIN  10/22/1951  . OPHTHALMOLOGY EXAM  06/25/2010  . INFLUENZA VACCINE  06/02/2018   Foot exam; will waive  Ur micro albumin ( may do thyroid check)  Ok with Dr. Elease Hashimoto as there is no dx diabetes     Falls no  Had a hand fracture    Cardiac Risk Factors include: advanced age (>39men, >37 women);family history of premature cardiovascular disease;hypertension   colonoscopy 06/10/2009 - no repeat due to age   Mammogram 08/2017 - 08/2018  03/05/2015 dexa -4.8 and is on fosamax May repeat bone density at the end of the 5 years       Objective:     Vitals: BP 136/80   Pulse 70   Ht 5\' 3"  (1.6 m)   Wt 141 lb 7 oz (64.2 kg)   SpO2 96%   BMI 25.05 kg/m   Body mass index is 25.05 kg/m.  Advanced Directives 06/14/2018 11/11/2017 11/09/2017 06/01/2017 05/31/2017 01/22/2017 01/12/2017  Does Patient Have a Medical Advance Directive? No No No No No No No  Would patient like information on creating a medical advance directive? - No - Patient declined No - Patient declined No - Patient declined - No - Patient declined No - Patient declined  Pre-existing out of  facility DNR order (yellow form or pink MOST form) - - - - - - -    Tobacco Social History   Tobacco Use  Smoking Status Never Smoker  Smokeless Tobacco Never Used     Counseling given: Yes   Clinical Intake:   Past Medical History:  Diagnosis Date  . Arthritis    lower back shoulder  . Arthropathy, unspecified, site unspecified   . Cancer ([place])    skin -spots on face removed  . Contact dermatitis and other eczema, due to unspecified cause    History  . Depression   . GERD (gastroesophageal reflux disease)    history - no current prob - no med  . Hypertension    was taking lisinopril but had an allergic reaction to medicaion  . 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 Other bursitis disorders    history  . Personal history of colonic polyps   . PONV (postoperative nausea and vomiting)   . Pre-diabetes   . Pure hypercholesterolemia   . Spondylolisthesis, cervical region   . SVD (spontaneous vaginal delivery)    x  1  . Unspecified disorder of thyroid   . Unspecified hypothyroidism    Past Surgical History:  Procedure Laterality Date  . ABDOMINAL HYSTERECTOMY    . ANTERIOR CERVICAL DECOMP/DISCECTOMY FUSION N/A 11/11/2017   Procedure: Cervical seven - Thoracic one  Anterior cervical decompression/discectomy/fusion;  Surgeon: Erline Levine, MD;  Location: [facility];  Service: Neurosurgery;  Laterality: N/A;  . ANTERIOR LAT LUMBAR FUSION Left 01/22/2017   Procedure: Left Lumbar three-four, Lumbar four-five  Anterior lateral lumbar interbody fusion;  Surgeon: Erline Levine, MD;  Location: [facility];  Service: Neurosurgery;  Laterality: Left;  . BACK SURGERY  2002   L4/L5  . BLADDER SUSPENSION N/A 03/07/2014   Procedure: TRANSVAGINAL TAPE (TVT) PROCEDURE WITH CYSTO;  Surgeon: Princess Bruins, MD;  Location: [facility];  Service: Gynecology;  Laterality: N/A;  vaginal   . blephorplasty      bilateral   . BREAST SURGERY     x 2 breast  implants, later removed  . COLONOSCOPY    . CYSTOCELE REPAIR N/A 03/07/2014   Procedure: ANTERIOR REPAIR (CYSTOCELE);  Surgeon: Princess Bruins, MD;  Location: [facility];  Service: Gynecology;  Laterality: N/A;  . devaited septum surgery  1975  . EYE SURGERY     bilateral cataract surgery  . KNEE ARTHROSCOPY  10/21/2012   right - Procedure: ARTHROSCOPY KNEE;  Surgeon: Mcarthur Rossetti, MD;  Location: [facility];  Service: Orthopedics;  Laterality: Left;  Left Knee Arthroscopy, Debridement, partial synovectomy  . LUMBAR LAMINECTOMY    . LUMBAR PERCUTANEOUS PEDICLE SCREW 2 LEVEL N/A 01/22/2017   Procedure: Lumbar Percutaneous Pedicle Screw Placement Lumbar three-four, Lumbar four-five;  Surgeon: Erline Levine, MD;  Location: [facility];  Service: Neurosurgery;  Laterality: N/A;  . ROBOTIC ASSISTED LAPAROSCOPIC SACROCOLPOPEXY N/A 03/07/2014   Procedure: ROBOTIC ASSISTED LAPAROSCOPIC SACROCOLPOPEXY With Carrolyn Meiers;  Surgeon: Princess Bruins, MD;  Location: [facility];  Service: Gynecology;  Laterality: N/A;  . ROBOTIC ASSISTED SUPRACERVICAL HYSTERECTOMY WITH BILATERAL SALPINGO OOPHERECTOMY N/A 03/07/2014   Procedure: ROBOTIC ASSISTED SUPRACERVICAL HYSTERECTOMY WITH BILATERAL SALPINGO OOPHORECTOMY;  Surgeon: Princess Bruins, MD;  Location: [facility];  Service: Gynecology;  Laterality: N/A;  4 hrs.  . TONSILLECTOMY    . TOTAL KNEE ARTHROPLASTY Left 12/30/2012   Procedure: TOTAL KNEE ARTHROPLASTY;  Surgeon: Mcarthur Rossetti, MD;  Location: [facility];  Service: Orthopedics;  Laterality: Left;  Left Total Knee Arthroplasty  . TUBAL LIGATION    . WISDOM TOOTH EXTRACTION     Family History  Problem Relation Age of Onset  . Cancer Mother        breast and lung  . Stroke Father   . Heart attack Father   . Diabetes Sister   . Emphysema Maternal Aunt   . Colon cancer Neg Hx    Social History   Socioeconomic History  . Marital status: Married    Spouse name: Not on file  . Number of children: Not on file  .  Years of education: Not on file  . Highest education level: Not on file  Occupational History  . Not on file  Social Needs  . Financial resource strain: Not on file  . Food insecurity:    Worry: Not on file    Inability: Not on file  . Transportation needs:    Medical: Not on file    Non-medical: Not on file  Tobacco Use  . Smoking status: Never Smoker  . Smokeless tobacco: Never Used  Substance and Sexual Activity  . Alcohol use: No  .  Drug use: No  . Sexual activity: Yes    Birth control/protection: Post-menopausal  Lifestyle  . Physical activity:    Days per week: Not on file    Minutes per session: Not on file  . Stress: Not on file  Relationships  . Social connections:    Talks on phone: Not on file    Gets together: Not on file    Attends religious service: Not on file    Active member of club or organization: Not on file    Attends meetings of clubs or organizations: Not on file    Relationship status: Not on file  Other Topics Concern  . Not on file  Social History Narrative  . Not on file    Outpatient Encounter Medications as of 06/14/2018  Medication Sig  . alendronate (FOSAMAX) 70 MG tablet Take 1 tablet (70 mg total) by mouth every 7 (seven) days. Take with a full glass of water on an empty stomach.  Floria Raveling, Vaccinium myrtillus, (BILBERRY PO) Take 1,200 mg elemental calcium/kg/hr by mouth daily.  . Biotin 3 MG TABS Take 1 tablet by mouth daily.  . Calcium Carb-Cholecalciferol (CALCIUM 500 + D3 PO) Take 1 tablet by mouth daily.   . Cholecalciferol 5000 UNITS capsule Take 5,000 Units by mouth daily.  . Cinnamon 500 MG TABS Take 1 tablet by mouth daily.  . Cyanocobalamin (VITAMIN B-12 PO) Take 5,000 mcg by mouth daily.  . Multiple Vitamins-Minerals (AIRBORNE) CHEW Chew 1 tablet by mouth daily.  Marland Kitchen omeprazole (PRILOSEC) 40 MG capsule TAKE ONE CAPSULE BY MOUTH DAILY  . thyroid (ARMOUR THYROID) 30 MG tablet TAKE 1 TABLET BY MOUTH EVERY DAY. NEED LABS FOR  MORE REFILLS (Patient taking differently: Take 30 mg by mouth daily before breakfast. TAKE 1 TABLET BY MOUTH EVERY DAY. NEED LABS FOR MORE REFILLS)  . TURMERIC CURCUMIN PO Take 1,000 mg by mouth daily.  Marland Kitchen VIRTUSSIN A/C 100-10 MG/5ML syrup Take 10 mLs by mouth at bedtime.  . vitamin E 400 UNIT capsule Take 1 tablet by mouth daily.  . colchicine (COLCRYS) 0.6 MG tablet Take 1 tablet (0.6 mg total) by mouth 2 (two) times daily as needed. (Patient not taking: Reported on 06/14/2018)  . FLUoxetine (PROZAC) 20 MG tablet Take 20 mg by mouth daily.  Marland Kitchen gabapentin (NEURONTIN) 300 MG capsule Take 300 mg by mouth 3 (three) times daily.  Marland Kitchen loratadine (CLARITIN) 10 MG tablet Take 1 tablet (10 mg total) by mouth daily. (Patient not taking: Reported on 06/14/2018)   No facility-administered encounter medications on file as of 06/14/2018.     Activities of Daily Living In your present state of health, do you have any difficulty performing the following activities: 06/14/2018 11/11/2017  Hearing? N N  Vision? N N  Difficulty concentrating or making decisions? N N  Walking or climbing stairs? N Y  Dressing or bathing? N Y  Doing errands, shopping? N Y  Conservation officer, nature and eating ? N -  Using the Toilet? N -  In the past six months, have you accidently leaked urine? Y -  Comment up at hs  -  Do you have problems with loss of bowel control? N -  Managing your Medications? N -  Managing your Finances? N -  Housekeeping or managing your Housekeeping? N -  Some recent data might be hidden    Patient Care Team: Eulas Post, MD as PCP - General (Family Medicine) Erline Levine, MD as Consulting Physician (Neurosurgery) Dermatology,  [place]    Assessment:   This is a routine wellness examination for Norman.  Exercise Activities and Dietary recommendations Current Exercise Habits: Home exercise routine, Time (Minutes): 30, Intensity: Mild  Goals    . Patient states (pt-stated)     Control snacking  at night.     . Weight (lb) < 135 lb (61.2 kg)     May try chair yoga        Fall Risk Fall Risk  06/14/2018 06/01/2017 02/24/2016 02/11/2015 10/30/2013  Falls in the past year? No Yes Yes No No  Number falls in past yr: - 2 or more 1 - -  Injury with Fall? - Yes Yes - -  Risk for fall due to : - Medication side effect;Impaired balance/gait - - -     Depression Screen PHQ 2/9 Scores 06/14/2018 06/01/2017 01/12/2017 02/24/2016  PHQ - 2 Score 0 0 0 0  PHQ- 9 Score - - 3 -     Cognitive Function MMSE - Mini Mental State Exam 06/14/2018  Not completed: (No Data)     Ad8 score reviewed for issues:  Issues making decisions:  Less interest in hobbies / activities:  Repeats questions, stories (family complaining):  Trouble using ordinary gadgets (microwave, computer, phone):  Forgets the month or year:   Mismanaging finances:   Remembering appts:  Daily problems with thinking and/or memory: Ad8 score is=        Immunization History  Administered Date(s) Administered  . Pneumococcal Conjugate-13 02/24/2016  . Pneumococcal Polysaccharide-23 02/11/2015  . Tdap 04/23/2017    Screening Tests Health Maintenance  Topic Date Due  . FOOT EXAM  10/22/1951  . URINE MICROALBUMIN  10/22/1951  . OPHTHALMOLOGY EXAM  06/25/2010  . INFLUENZA VACCINE  06/02/2018  . HEMOGLOBIN A1C  09/21/2018  . TETANUS/TDAP  04/24/2027  . DEXA SCAN  Completed  . PNA vac Low Risk Adult  Completed         Plan:      PCP Notes   Health Maintenance colonoscopy 06/10/2009 - no repeat due to age   To schedule eye exam  Mammogram 08/2017 - 08/2018  03/05/2015 dexa -4.8 and is on fosamax May repeat bone density at the end of the 5 years   Educated regarding shingrix   ua microablumin removed as she is not diabetic       Abnormal Screens   Referrals  None  Patient concerns; Sleep 5 hours Goes to bed 10:30 to 11 and gets up at 8am and gets up to go to the bathroom  Drinks a lot  of water  Tried melatonin  Reviewed sleep hygiene.   May request TSH be rechecked at her apt 08/20  she is still not sleeping at hs well Reviewed and was drawn 02/2016 but did not see TSH 09/2017    Nurse Concerns;  as noted   Next PCP apt 8/20    I have personally reviewed and noted the following in the patient's chart:   . Medical and social history . Use of alcohol, tobacco or illicit drugs  . Current medications and supplements . Functional ability and status . Nutritional status . Physical activity . Advanced directives . List of other physicians . Hospitalizations, surgeries, and ER visits in previous 12 months . Vitals . Screenings to include cognitive, depression, and falls . Referrals and appointments  In addition, I have reviewed and discussed with patient certain preventive protocols, quality metrics, and best practice recommendations. A written personalized  care plan for preventive services as well as general preventive health recommendations were provided to patient.     [email or web address],ALPFX, RN  06/14/2018  I have reviewed the documentation for the AWV and [place] provided by the health coach and agree with their documentation. I was immediately available for any questions  Eulas Post MD [facility]

## 2018-06-14 NOTE — Patient Instructions (Addendum)
Julie [name] , Thank you for taking time to come for your Medicare Wellness Visit. I appreciate your ongoing commitment to your health goals. Please review the following plan we discussed and let me know if I can assist you in the future.   Can try "Say Goodnight to Insomnia"   May have Thyroid check next md visit;   Please schedule mammogram every year 08/2017 (your last one)   Educated regarding prediabetes and numbers;  A1c ranges from 5.8 to 6.5 or fasting Blood sugar > 115 -126; (126 is diabetic)   Risk: >45yo; family hx; overweight or obese; African American; Hispanic; Latino; American Indian; Asian American; Pacific Islander; history of diabetes when pregnant; or birth to a baby weighing over 9 lbs. Being less physically active than 30 minutes; 3 times a week;   Prevention; Losing a modest 7 to 8 lbs; If over 200 lbs; 10 to 14 lbs;  Choose healthier foods; colorful veggies; fish or lean meats; drinks water Reduce portion size Start exercising; 30 minutes of fast walking x 30 minutes per day/ 60 min for weight loss   Please have eye exam this year   Can discuss any bladder issues with Dr. Burchette     These are the goals we discussed: Goals    . Patient states (pt-stated)     Control snacking at night.     . Weight (lb) < 135 lb (61.2 kg)     May try chair yoga        This is a list of the screening recommended for you and due dates:  Health Maintenance  Topic Date Due  . Complete foot exam   10/22/1951  . Urine Protein Check  10/22/1951  . Eye exam for diabetics  06/25/2010  . Flu Shot  06/02/2018  . Hemoglobin A1C  09/21/2018  . Tetanus Vaccine  04/24/2027  . DEXA scan (bone density measurement)  Completed  . Pneumonia vaccines  Completed      Fall Prevention in the Home Falls can cause injuries. They can happen to people of all ages. There are many things you can do to make your home safe and to help prevent falls. What can I do on the outside of my  home?  Regularly fix the edges of walkways and driveways and fix any cracks.  Remove anything that might make you trip as you walk through a door, such as a raised step or threshold.  Trim any bushes or trees on the path to your home.  Use bright outdoor lighting.  Clear any walking paths of anything that might make someone trip, such as rocks or tools.  Regularly check to see if handrails are loose or broken. Make sure that both sides of any steps have handrails.  Any raised decks and porches should have guardrails on the edges.  Have any leaves, snow, or ice cleared regularly.  Use sand or salt on walking paths during winter.  Clean up any spills in your garage right away. This includes oil or grease spills. What can I do in the bathroom?  Use night lights.  Install grab bars by the toilet and in the tub and shower. Do not use towel bars as grab bars.  Use non-skid mats or decals in the tub or shower.  If you need to sit down in the shower, use a plastic, non-slip stool.  Keep the floor dry. Clean up any water that spills on the floor as soon as it happens.    Remove soap buildup in the tub or shower regularly.  Attach bath mats securely with double-sided non-slip rug tape.  Do not have throw rugs and other things on the floor that can make you trip. What can I do in the bedroom?  Use night lights.  Make sure that you have a light by your bed that is easy to reach.  Do not use any sheets or blankets that are too big for your bed. They should not hang down onto the floor.  Have a firm chair that has side arms. You can use this for support while you get dressed.  Do not have throw rugs and other things on the floor that can make you trip. What can I do in the kitchen?  Clean up any spills right away.  Avoid walking on wet floors.  Keep items that you use a lot in easy-to-reach places.  If you need to reach something above you, use a strong step stool that has a  grab bar.  Keep electrical cords out of the way.  Do not use floor polish or wax that makes floors slippery. If you must use wax, use non-skid floor wax.  Do not have throw rugs and other things on the floor that can make you trip. What can I do with my stairs?  Do not leave any items on the stairs.  Make sure that there are handrails on both sides of the stairs and use them. Fix handrails that are broken or loose. Make sure that handrails are as long as the stairways.  Check any carpeting to make sure that it is firmly attached to the stairs. Fix any carpet that is loose or worn.  Avoid having throw rugs at the top or bottom of the stairs. If you do have throw rugs, attach them to the floor with carpet tape.  Make sure that you have a light switch at the top of the stairs and the bottom of the stairs. If you do not have them, ask someone to add them for you. What else can I do to help prevent falls?  Wear shoes that: ? Do not have high heels. ? Have rubber bottoms. ? Are comfortable and fit you well. ? Are closed at the toe. Do not wear sandals.  If you use a stepladder: ? Make sure that it is fully opened. Do not climb a closed stepladder. ? Make sure that both sides of the stepladder are locked into place. ? Ask someone to hold it for you, if possible.  Clearly mark and make sure that you can see: ? Any grab bars or handrails. ? First and last steps. ? Where the edge of each step is.  Use tools that help you move around (mobility aids) if they are needed. These include: ? Canes. ? Walkers. ? Scooters. ? Crutches.  Turn on the lights when you go into a dark area. Replace any light bulbs as soon as they burn out.  Set up your furniture so you have a clear path. Avoid moving your furniture around.  If any of your floors are uneven, fix them.  If there are any pets around you, be aware of where they are.  Review your medicines with your doctor. Some medicines can make  you feel dizzy. This can increase your chance of falling. Ask your doctor what other things that you can do to help prevent falls. This information is not intended to replace advice given to you by your health   care provider. Make sure you discuss any questions you have with your health care provider. Document Released: 08/15/2009 Document Revised: 03/26/2016 Document Reviewed: 11/23/2014 Elsevier Interactive Patient Education  2018 [employer]  Health Maintenance, Female Adopting a healthy lifestyle and getting preventive care can go a long way to promote health and wellness. Talk with your health care provider about what schedule of regular examinations is right for you. This is a good chance for you to check in with your provider about disease prevention and staying healthy. In between checkups, there are plenty of things you can do on your own. Experts have done a lot of research about which lifestyle changes and preventive measures are most likely to keep you healthy. Ask your health care provider for more information. Weight and diet Eat a healthy diet  Be sure to include plenty of vegetables, fruits, low-fat dairy products, and lean protein.  Do not eat a lot of foods high in solid fats, added sugars, or salt.  Get regular exercise. This is one of the most important things you can do for your health. ? Most adults should exercise for at least 150 minutes each week. The exercise should increase your heart rate and make you sweat (moderate-intensity exercise). ? Most adults should also do strengthening exercises at least twice a week. This is in addition to the moderate-intensity exercise.  Maintain a healthy weight  Body mass index (BMI) is a measurement that can be used to identify possible weight problems. It estimates body fat based on height and weight. Your health care provider can help determine your BMI and help you achieve or maintain a healthy weight.  For females 20 years of  age and older: ? A BMI below 18.5 is considered underweight. ? A BMI of 18.5 to 24.9 is normal. ? A BMI of 25 to 29.9 is considered overweight. ? A BMI of 30 and above is considered obese.  Watch levels of cholesterol and blood lipids  You should start having your blood tested for lipids and cholesterol at 79 years of age, then have this test every 5 years.  You may need to have your cholesterol levels checked more often if: ? Your lipid or cholesterol levels are high. ? You are older than 79 years of age. ? You are at high risk for heart disease.  Cancer screening Lung Cancer  Lung cancer screening is recommended for adults 55-80 years old who are at high risk for lung cancer because of a history of smoking.  A yearly low-dose CT scan of the lungs is recommended for people who: ? Currently smoke. ? Have quit within the past 15 years. ? Have at least a 30-pack-year history of smoking. A pack year is smoking an average of one pack of cigarettes a day for 1 year.  Yearly screening should continue until it has been 15 years since you quit.  Yearly screening should stop if you develop a health problem that would prevent you from having lung cancer treatment.  Breast Cancer  Practice breast self-awareness. This means understanding how your breasts normally appear and feel.  It also means doing regular breast self-exams. Let your health care provider know about any changes, no matter how small.  If you are in your 20s or 30s, you should have a clinical breast exam (CBE) by a health care provider every 1-3 years as part of a regular health exam.  If you are 40 or older, have a CBE every year. Also consider   having a breast X-ray (mammogram) every year.  If you have a family history of breast cancer, talk to your health care provider about genetic screening.  If you are at high risk for breast cancer, talk to your health care provider about having an MRI and a mammogram every  year.  Breast cancer gene (BRCA) assessment is recommended for women who have family members with BRCA-related cancers. BRCA-related cancers include: ? Breast. ? Ovarian. ? Tubal. ? Peritoneal cancers.  Results of the assessment will determine the need for genetic counseling and BRCA1 and BRCA2 testing.  Cervical Cancer Your health care provider may recommend that you be screened regularly for cancer of the pelvic organs (ovaries, uterus, and vagina). This screening involves a pelvic examination, including checking for microscopic changes to the surface of your cervix (Pap test). You may be encouraged to have this screening done every 3 years, beginning at age 72.  For women ages 9-65, health care providers may recommend pelvic exams and Pap testing every 3 years, or they may recommend the Pap and pelvic exam, combined with testing for human papilloma virus (HPV), every 5 years. Some types of HPV increase your risk of cervical cancer. Testing for HPV may also be done on women of any age with unclear Pap test results.  Other health care providers may not recommend any screening for nonpregnant women who are considered low risk for pelvic cancer and who do not have symptoms. Ask your health care provider if a screening pelvic exam is right for you.  If you have had past treatment for cervical cancer or a condition that could lead to cancer, you need Pap tests and screening for cancer for at least 20 years after your treatment. If Pap tests have been discontinued, your risk factors (such as having a new sexual partner) need to be reassessed to determine if screening should resume. Some women have medical problems that increase the chance of getting cervical cancer. In these cases, your health care provider may recommend more frequent screening and Pap tests.  Colorectal Cancer  This type of cancer can be detected and often prevented.  Routine colorectal cancer screening usually begins at 79  years of age and continues through 79 years of age.  Your health care provider may recommend screening at an earlier age if you have risk factors for colon cancer.  Your health care provider may also recommend using home test kits to check for hidden blood in the stool.  A small camera at the end of a tube can be used to examine your colon directly (sigmoidoscopy or colonoscopy). This is done to check for the earliest forms of colorectal cancer.  Routine screening usually begins at age 59.  Direct examination of the colon should be repeated every 5-10 years through 79 years of age. However, you may need to be screened more often if early forms of precancerous polyps or small growths are found.  Skin Cancer  Check your skin from head to toe regularly.  Tell your health care provider about any new moles or changes in moles, especially if there is a change in a mole's shape or color.  Also tell your health care provider if you have a mole that is larger than the size of a pencil eraser.  Always use sunscreen. Apply sunscreen liberally and repeatedly throughout the day.  Protect yourself by wearing long sleeves, pants, a wide-brimmed hat, and sunglasses whenever you are outside.  Heart disease, diabetes, and high blood pressure  High blood pressure causes heart disease and increases the risk of stroke. High blood pressure is more likely to develop in: ? People who have blood pressure in the high end of the normal range (130-139/85-89 mm Hg). ? People who are overweight or obese. ? People who are African American.  If you are 59-59 years of age, have your blood pressure checked every 3-5 years. If you are 4 years of age or older, have your blood pressure checked every year. You should have your blood pressure measured twice-once when you are at a hospital or clinic, and once when you are not at a hospital or clinic. Record the average of the two measurements. To check your blood pressure  when you are not at a hospital or clinic, you can use: ? An automated blood pressure machine at a pharmacy. ? A home blood pressure monitor.  If you are between 11 years and 79 years old, ask your health care provider if you should take aspirin to prevent strokes.  Have regular diabetes screenings. This involves taking a blood sample to check your fasting blood sugar level. ? If you are at a normal weight and have a low risk for diabetes, have this test once every three years after 79 years of age. ? If you are overweight and have a high risk for diabetes, consider being tested at a younger age or more often. Preventing infection Hepatitis B  If you have a higher risk for hepatitis B, you should be screened for this virus. You are considered at high risk for hepatitis B if: ? You were born in a country where hepatitis B is common. Ask your health care provider which countries are considered high risk. ? Your parents were born in a high-risk country, and you have not been immunized against hepatitis B (hepatitis B vaccine). ? You have HIV or AIDS. ? You use needles to inject street drugs. ? You live with someone who has hepatitis B. ? You have had sex with someone who has hepatitis B. ? You get hemodialysis treatment. ? You take certain medicines for conditions, including cancer, organ transplantation, and autoimmune conditions.  Hepatitis C  Blood testing is recommended for: ? Everyone born from [birth year] through 1967. ? Anyone with known risk factors for hepatitis C.  Sexually transmitted infections (STIs)  You should be screened for sexually transmitted infections (STIs) including gonorrhea and chlamydia if: ? You are sexually active and are younger than 79 years of age. ? You are older than 79 years of age and your health care provider tells you that you are at risk for this type of infection. ? Your sexual activity has changed since you were last screened and you are at an increased  risk for chlamydia or gonorrhea. Ask your health care provider if you are at risk.  If you do not have HIV, but are at risk, it may be recommended that you take a prescription medicine daily to prevent HIV infection. This is called pre-exposure prophylaxis (PrEP). You are considered at risk if: ? You are sexually active and do not regularly use condoms or know the HIV status of your partner(s). ? You take drugs by injection. ? You are sexually active with a partner who has HIV.  Talk with your health care provider about whether you are at high risk of being infected with HIV. If you choose to begin PrEP, you should first be tested for HIV. You should then be tested every 3 months  for as long as you are taking PrEP. Pregnancy  If you are premenopausal and you may become pregnant, ask your health care provider about preconception counseling.  If you may become pregnant, take 400 to 800 micrograms (mcg) of folic acid every day.  If you want to prevent pregnancy, talk to your health care provider about birth control (contraception). Osteoporosis and menopause  Osteoporosis is a disease in which the bones lose minerals and strength with aging. This can result in serious bone fractures. Your risk for osteoporosis can be identified using a bone density scan.  If you are 84 years of age or older, or if you are at risk for osteoporosis and fractures, ask your health care provider if you should be screened.  Ask your health care provider whether you should take a calcium or vitamin D supplement to lower your risk for osteoporosis.  Menopause may have certain physical symptoms and risks.  Hormone replacement therapy may reduce some of these symptoms and risks. Talk to your health care provider about whether hormone replacement therapy is right for you. Follow these instructions at home:  Schedule regular health, dental, and eye exams.  Stay current with your immunizations.  Do not use any  tobacco products including cigarettes, chewing tobacco, or electronic cigarettes.  If you are pregnant, do not drink alcohol.  If you are breastfeeding, limit how much and how often you drink alcohol.  Limit alcohol intake to no more than 1 drink per day for nonpregnant women. One drink equals 12 ounces of beer, 5 ounces of wine, or 1 ounces of hard liquor.  Do not use street drugs.  Do not share needles.  Ask your health care provider for help if you need support or information about quitting drugs.  Tell your health care provider if you often feel depressed.  Tell your health care provider if you have ever been abused or do not feel safe at home. This information is not intended to replace advice given to you by your health care provider. Make sure you discuss any questions you have with your health care provider. Document Released: 05/04/2011 Document Revised: 03/26/2016 Document Reviewed: 07/23/2015 Elsevier Interactive Patient Education  [employer].

## 2018-06-21 ENCOUNTER — Ambulatory Visit: Payer: PPO | Admitting: Family Medicine

## 2018-07-18 ENCOUNTER — Other Ambulatory Visit: Payer: Self-pay

## 2018-07-18 ENCOUNTER — Encounter: Payer: Self-pay | Admitting: Family Medicine

## 2018-07-18 ENCOUNTER — Ambulatory Visit (INDEPENDENT_AMBULATORY_CARE_PROVIDER_SITE_OTHER): Payer: PPO | Admitting: Family Medicine

## 2018-07-18 VITALS — BP 124/76 | HR 71 | Temp 97.7°F | Resp 16 | Ht 58.8 in | Wt 140.6 lb

## 2018-07-18 DIAGNOSIS — E039 Hypothyroidism, unspecified: Secondary | ICD-10-CM

## 2018-07-18 DIAGNOSIS — I10 Essential (primary) hypertension: Secondary | ICD-10-CM | POA: Diagnosis not present

## 2018-07-18 DIAGNOSIS — R7303 Prediabetes: Secondary | ICD-10-CM

## 2018-07-18 LAB — TSH: TSH: 1.92 u[IU]/mL (ref 0.35–4.50)

## 2018-07-18 LAB — HEMOGLOBIN A1C: Hgb A1c MFr Bld: 6.4 % (ref 4.6–6.5)

## 2018-07-18 NOTE — Progress Notes (Signed)
Subjective:     Patient ID: Julie [name], female   DOB: 1941/05/19, 77 y.o.   MRN: [id number]  HPI Patient seen for routine medical follow-up. She just returned from a trip to [place] with her daughter. She did well. She did have some issues with dyspnea with ambulation but no chest pain. Chronic problems include chronic back difficulties, recurrent depression, osteoarthritis multiple joints, osteoporosis, hypothyroidism, hypertension, prediabetes  Medications reviewed. Last A1c 5.8%. She has gained 6 pounds since last visit. No polyuria or polydipsia. She is due for repeat thyroid testing.  Denies any recent falls. She declines flu vaccine.  Past Medical History:  Diagnosis Date  . Arthritis    lower back shoulder  . Arthropathy, unspecified, site unspecified   . Cancer ([place])    skin -spots on face removed  . Contact dermatitis and other eczema, due to unspecified cause    History  . Depression   . GERD (gastroesophageal reflux disease)    history - no current prob - no med  . Hypertension    was taking lisinopril but had an allergic reaction to medicaion  . 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 Other bursitis disorders    history  . Personal history of colonic polyps   . PONV (postoperative nausea and vomiting)   . Pre-diabetes   . Pure hypercholesterolemia   . Spondylolisthesis, cervical region   . SVD (spontaneous vaginal delivery)    x 1  . Unspecified disorder of thyroid   . Unspecified hypothyroidism    Past Surgical History:  Procedure Laterality Date  . ABDOMINAL HYSTERECTOMY    . ANTERIOR CERVICAL DECOMP/DISCECTOMY FUSION N/A 11/11/2017   Procedure: Cervical seven - Thoracic one  Anterior cervical decompression/discectomy/fusion;  Surgeon: Erline Levine, MD;  Location: [facility];  Service: Neurosurgery;  Laterality: N/A;  . ANTERIOR LAT LUMBAR FUSION Left 01/22/2017   Procedure: Left Lumbar three-four, Lumbar  four-five  Anterior lateral lumbar interbody fusion;  Surgeon: Erline Levine, MD;  Location: [facility];  Service: Neurosurgery;  Laterality: Left;  . BACK SURGERY  2002   L4/L5  . BLADDER SUSPENSION N/A 03/07/2014   Procedure: TRANSVAGINAL TAPE (TVT) PROCEDURE WITH CYSTO;  Surgeon: Princess Bruins, MD;  Location: [facility];  Service: Gynecology;  Laterality: N/A;  vaginal   . blephorplasty      bilateral   . BREAST SURGERY     x 2 breast implants, later removed  . COLONOSCOPY    . CYSTOCELE REPAIR N/A 03/07/2014   Procedure: ANTERIOR REPAIR (CYSTOCELE);  Surgeon: Princess Bruins, MD;  Location: [facility];  Service: Gynecology;  Laterality: N/A;  . devaited septum surgery  1975  . EYE SURGERY     bilateral cataract surgery  . KNEE ARTHROSCOPY  10/21/2012   right - Procedure: ARTHROSCOPY KNEE;  Surgeon: Mcarthur Rossetti, MD;  [facility];  Service: Orthopedics;  Laterality: Left;  Left Knee Arthroscopy, Debridement, partial synovectomy  . LUMBAR LAMINECTOMY    . LUMBAR PERCUTANEOUS PEDICLE SCREW 2 LEVEL N/A 01/22/2017   Procedure: Lumbar Percutaneous Pedicle Screw Placement Lumbar three-four, Lumbar four-five;  Surgeon: Erline Levine, MD;  Location: [facility];  Service: Neurosurgery;  Laterality: N/A;  . ROBOTIC ASSISTED LAPAROSCOPIC SACROCOLPOPEXY N/A 03/07/2014   Procedure: ROBOTIC ASSISTED LAPAROSCOPIC SACROCOLPOPEXY With Carrolyn Meiers;  Surgeon: Princess Bruins, MD;  Location: [facility];  Service: Gynecology;  Laterality: N/A;  . ROBOTIC ASSISTED SUPRACERVICAL HYSTERECTOMY WITH BILATERAL SALPINGO OOPHERECTOMY N/A  03/07/2014   Procedure: ROBOTIC ASSISTED SUPRACERVICAL HYSTERECTOMY WITH BILATERAL SALPINGO OOPHORECTOMY;  Surgeon: Princess Bruins, MD;  Location: [facility];  Service: Gynecology;  Laterality: N/A;  4 hrs.  . TONSILLECTOMY    . TOTAL KNEE ARTHROPLASTY Left 12/30/2012   Procedure: TOTAL KNEE ARTHROPLASTY;  Surgeon: Mcarthur Rossetti, MD;  [facility];  Service: Orthopedics;  Laterality:  Left;  Left Total Knee Arthroplasty  . TUBAL LIGATION    . WISDOM TOOTH EXTRACTION      reports that she has never smoked. She has never used smokeless tobacco. She reports that she does not drink alcohol or use drugs. family history includes Cancer in her mother; Diabetes in her sister; Emphysema in her maternal aunt; Heart attack in her father; Stroke in her father. Allergies  Allergen Reactions  . Penicillins Anaphylaxis    REACTION: causes tongue to swell Has patient had a PCN reaction causing immediate rash, facial/tongue/throat swelling, SOB or lightheadedness with hypotension: Yes swelling Has patient had a PCN reaction causing severe rash involving mucus membranes or skin necrosis:  Has patient had a PCN reaction that required hospitalization no Has patient had a PCN reaction occurring within the last 10 years: no If all of the above answers are "NO", then may proceed with Cephalosporin use.   . Adhesive [Tape] Other (See Comments)    Please use paper tape  . Codeine Nausea Only  . Lisinopril     REACTION: wheezing  . Other Other (See Comments)    Pt was having knee surgery and was given an epidural, which did not work.  [name] [Oxycodone-Acetaminophen] Nausea And Vomiting     Review of Systems  Constitutional: Negative for fatigue.  Eyes: Negative for visual disturbance.  Respiratory: Negative for cough, chest tightness, shortness of breath and wheezing.   Cardiovascular: Negative for chest pain, palpitations and leg swelling.  Endocrine: Negative for polydipsia and polyuria.  Musculoskeletal: Positive for arthralgias and back pain.  Neurological: Negative for dizziness, seizures, syncope, weakness, light-headedness and headaches.       Objective:   Physical Exam  Constitutional: She appears well-developed and well-nourished.  Eyes: Pupils are equal, round, and reactive to light.  Neck: Neck supple. No JVD present. No thyromegaly present.  Cardiovascular:  Normal rate and regular rhythm. Exam reveals no gallop.  Pulmonary/Chest: Effort normal and breath sounds normal. No respiratory distress. She has no wheezes. She has no rales.  Musculoskeletal: She exhibits no edema.  Neurological: She is alert.       Assessment:     #1 hypertension stable and at goal  #2 hypothyroidism. Needs follow-up TSH  #3 history of prediabetes    Plan:     -she is encouraged to lose weight and keep sugar and starch intake down -Recheck labs today with TSH and hemoglobin A1c -Flu vaccine recommended and patient declines -Routine follow-up in 6 months and sooner as needed  Eulas Post MD [facility] at [facility]

## 2018-08-09 DIAGNOSIS — H35372 Puckering of macula, left eye: Secondary | ICD-10-CM | POA: Diagnosis not present

## 2018-08-09 DIAGNOSIS — H04123 Dry eye syndrome of bilateral lacrimal glands: Secondary | ICD-10-CM | POA: Diagnosis not present

## 2018-08-09 DIAGNOSIS — H52203 Unspecified astigmatism, bilateral: Secondary | ICD-10-CM | POA: Diagnosis not present

## 2018-08-09 DIAGNOSIS — H0100A Unspecified blepharitis right eye, upper and lower eyelids: Secondary | ICD-10-CM | POA: Diagnosis not present

## 2018-08-09 LAB — HM DIABETES EYE EXAM

## 2018-08-31 ENCOUNTER — Other Ambulatory Visit: Payer: Self-pay | Admitting: Family Medicine

## 2018-09-26 DIAGNOSIS — Z85828 Personal history of other malignant neoplasm of skin: Secondary | ICD-10-CM | POA: Diagnosis not present

## 2018-09-26 DIAGNOSIS — D485 Neoplasm of uncertain behavior of skin: Secondary | ICD-10-CM | POA: Diagnosis not present

## 2018-09-26 DIAGNOSIS — D225 Melanocytic nevi of trunk: Secondary | ICD-10-CM | POA: Diagnosis not present

## 2018-09-26 DIAGNOSIS — L821 Other seborrheic keratosis: Secondary | ICD-10-CM | POA: Diagnosis not present

## 2018-09-26 DIAGNOSIS — D2262 Melanocytic nevi of left upper limb, including shoulder: Secondary | ICD-10-CM | POA: Diagnosis not present

## 2018-09-26 DIAGNOSIS — L812 Freckles: Secondary | ICD-10-CM | POA: Diagnosis not present

## 2018-09-26 DIAGNOSIS — D1801 Hemangioma of skin and subcutaneous tissue: Secondary | ICD-10-CM | POA: Diagnosis not present

## 2018-10-05 ENCOUNTER — Other Ambulatory Visit: Payer: Self-pay | Admitting: Family Medicine

## 2018-10-06 ENCOUNTER — Other Ambulatory Visit: Payer: Self-pay | Admitting: Family Medicine

## 2018-10-06 MED ORDER — THYROID 30 MG PO TABS: ORAL_TABLET | ORAL | 0 refills | Status: DC

## 2018-10-06 NOTE — Telephone Encounter (Signed)
Copied from [facility] [id number]. Topic: Quick Communication - Rx Refill/Question >> Oct 06, 2018  9:07 AM Leward Quan A wrote: Medication: thyroid (ARMOUR THYROID) 30 MG tablet   Per patient she is all out of medication  Has the patient contacted their pharmacy? Yes.   (Agent: If no, request that the patient contact the pharmacy for the refill.) (Agent: If yes, when and what did the pharmacy advise?)  Preferred Pharmacy (with phone number or street name): [facility] [place], [address] AT [address] [phone] (Phone) [phone] (Fax)    Agent: Please be advised that RX refills may take up to 3 business days. We ask that you follow-up with your pharmacy.

## 2018-11-26 ENCOUNTER — Other Ambulatory Visit: Payer: Self-pay | Admitting: Family Medicine

## 2018-11-29 DIAGNOSIS — L820 Inflamed seborrheic keratosis: Secondary | ICD-10-CM | POA: Diagnosis not present

## 2018-11-29 DIAGNOSIS — D485 Neoplasm of uncertain behavior of skin: Secondary | ICD-10-CM | POA: Diagnosis not present

## 2018-11-29 DIAGNOSIS — Z85828 Personal history of other malignant neoplasm of skin: Secondary | ICD-10-CM | POA: Diagnosis not present

## 2018-12-04 ENCOUNTER — Other Ambulatory Visit: Payer: Self-pay | Admitting: Family Medicine

## 2018-12-12 ENCOUNTER — Other Ambulatory Visit: Payer: Self-pay | Admitting: Family Medicine

## 2018-12-20 ENCOUNTER — Ambulatory Visit (INDEPENDENT_AMBULATORY_CARE_PROVIDER_SITE_OTHER): Payer: PPO | Admitting: Family Medicine

## 2018-12-20 ENCOUNTER — Other Ambulatory Visit: Payer: Self-pay

## 2018-12-20 ENCOUNTER — Encounter: Payer: Self-pay | Admitting: Family Medicine

## 2018-12-20 VITALS — BP 102/64 | HR 77 | Temp 98.5°F | Ht 58.8 in | Wt 134.7 lb

## 2018-12-20 DIAGNOSIS — B9789 Other viral agents as the cause of diseases classified elsewhere: Secondary | ICD-10-CM | POA: Diagnosis not present

## 2018-12-20 DIAGNOSIS — J069 Acute upper respiratory infection, unspecified: Secondary | ICD-10-CM

## 2018-12-20 MED ORDER — HYDROCODONE-HOMATROPINE 5-1.5 MG/5ML PO SYRP: 5.0000 mL | ORAL_SOLUTION | Freq: Four times a day (QID) | ORAL | 0 refills | Status: AC | PRN

## 2018-12-20 NOTE — Progress Notes (Signed)
Subjective:     Patient ID: Julie [name], female   DOB: [birth year], 78 y.o.   MRN: [id number]  HPI Patient seen for acute respiratory illness.  Onset last Friday of sore throat.  She then developed some increasing cough.  Minimal nasal congestion.  She has had some intermittent headaches.  No fever.  No real body aches.  Cough mostly nonproductive.  Severe at times.  She has tried NyQuil without much relief.  Interfering with sleep.  Has taken hydrocodone cough syrup in the past without difficulty.  Denies any nausea or vomiting  Past Medical History:  Diagnosis Date  . Arthritis    lower back shoulder  . Arthropathy, unspecified, site unspecified   . Cancer ([place])    skin -spots on face removed  . Contact dermatitis and other eczema, due to unspecified cause    History  . Depression   . GERD (gastroesophageal reflux disease)    history - no current prob - no med  . Hypertension    was taking lisinopril but had an allergic reaction to medicaion  . 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 Other bursitis disorders    history  . Personal history of colonic polyps   . PONV (postoperative nausea and vomiting)   . Pre-diabetes   . Pure hypercholesterolemia   . Spondylolisthesis, cervical region   . SVD (spontaneous vaginal delivery)    x 1  . Unspecified disorder of thyroid   . Unspecified hypothyroidism    Past Surgical History:  Procedure Laterality Date  . ABDOMINAL HYSTERECTOMY    . ANTERIOR CERVICAL DECOMP/DISCECTOMY FUSION N/A 11/11/2017   Procedure: Cervical seven - Thoracic one  Anterior cervical decompression/discectomy/fusion;  Surgeon: Erline Levine, MD;  Location: [facility];  Service: Neurosurgery;  Laterality: N/A;  . ANTERIOR LAT LUMBAR FUSION Left 01/22/2017   Procedure: Left Lumbar three-four, Lumbar four-five  Anterior lateral lumbar interbody fusion;  Surgeon: Erline Levine, MD;  Location: [facility];  Service: Neurosurgery;   Laterality: Left;  . BACK SURGERY  2002   L4/L5  . BLADDER SUSPENSION N/A 03/07/2014   Procedure: TRANSVAGINAL TAPE (TVT) PROCEDURE WITH CYSTO;  Surgeon: Princess Bruins, MD;  Location: [facility];  Service: Gynecology;  Laterality: N/A;  vaginal   . blephorplasty      bilateral   . BREAST SURGERY     x 2 breast implants, later removed  . COLONOSCOPY    . CYSTOCELE REPAIR N/A 03/07/2014   Procedure: ANTERIOR REPAIR (CYSTOCELE);  Surgeon: Princess Bruins, MD;  Location: [facility];  Service: Gynecology;  Laterality: N/A;  . devaited septum surgery  1975  . EYE SURGERY     bilateral cataract surgery  . KNEE ARTHROSCOPY  10/21/2012   right - Procedure: ARTHROSCOPY KNEE;  Surgeon: Mcarthur Rossetti, MD;  [facility];  Service: Orthopedics;  Laterality: Left;  Left Knee Arthroscopy, Debridement, partial synovectomy  . LUMBAR LAMINECTOMY    . LUMBAR PERCUTANEOUS PEDICLE SCREW 2 LEVEL N/A 01/22/2017   Procedure: Lumbar Percutaneous Pedicle Screw Placement Lumbar three-four, Lumbar four-five;  Surgeon: Erline Levine, MD;  Location: [facility];  Service: Neurosurgery;  Laterality: N/A;  . ROBOTIC ASSISTED LAPAROSCOPIC SACROCOLPOPEXY N/A 03/07/2014   Procedure: ROBOTIC ASSISTED LAPAROSCOPIC SACROCOLPOPEXY With Carrolyn Meiers;  Surgeon: Princess Bruins, MD;  Location: [facility];  Service: Gynecology;  Laterality: N/A;  . ROBOTIC ASSISTED SUPRACERVICAL HYSTERECTOMY WITH BILATERAL SALPINGO OOPHERECTOMY N/A 03/07/2014   Procedure: ROBOTIC ASSISTED  SUPRACERVICAL HYSTERECTOMY WITH BILATERAL SALPINGO OOPHORECTOMY;  Surgeon: Princess Bruins, MD;  Location: [facility];  Service: Gynecology;  Laterality: N/A;  4 hrs.  . TONSILLECTOMY    . TOTAL KNEE ARTHROPLASTY Left 12/30/2012   Procedure: TOTAL KNEE ARTHROPLASTY;  Surgeon: Mcarthur Rossetti, MD;  [facility];  Service: Orthopedics;  Laterality: Left;  Left Total Knee Arthroplasty  . TUBAL LIGATION    . WISDOM TOOTH EXTRACTION      reports that she has never  smoked. She has never used smokeless tobacco. She reports that she does not drink alcohol or use drugs. family history includes Cancer in her mother; Diabetes in her sister; Emphysema in her maternal aunt; Heart attack in her father; Stroke in her father. Allergies  Allergen Reactions  . Penicillins Anaphylaxis    REACTION: causes tongue to swell Has patient had a PCN reaction causing immediate rash, facial/tongue/throat swelling, SOB or lightheadedness with hypotension: Yes swelling Has patient had a PCN reaction causing severe rash involving mucus membranes or skin necrosis:  Has patient had a PCN reaction that required hospitalization no Has patient had a PCN reaction occurring within the last 10 years: no If all of the above answers are "NO", then may proceed with Cephalosporin use.   . Adhesive [Tape] Other (See Comments)    Please use paper tape  . Codeine Nausea Only  . Lisinopril     REACTION: wheezing  . Other Other (See Comments)    Pt was having knee surgery and was given an epidural, which did not work.  [name] [Oxycodone-Acetaminophen] Nausea And Vomiting     Review of Systems  Constitutional: Positive for fatigue. Negative for chills and fever.  HENT: Positive for sore throat. Negative for sinus pressure and sinus pain.   Respiratory: Positive for cough.   Neurological: Positive for headaches.       Objective:   Physical Exam Constitutional:      Appearance: She is well-developed.  HENT:     Right Ear: Tympanic membrane normal.     Left Ear: Tympanic membrane normal.     Mouth/Throat:     Mouth: Mucous membranes are moist.     Pharynx: Oropharynx is clear. No oropharyngeal exudate or posterior oropharyngeal erythema.     Tonsils: No tonsillar exudate.  Neck:     Musculoskeletal: Neck supple.  Cardiovascular:     Rate and Rhythm: Normal rate and regular rhythm.  Pulmonary:     Effort: Pulmonary effort is normal.     Breath sounds: Normal breath sounds.  No wheezing or rales.  Lymphadenopathy:     Cervical: No cervical adenopathy.  Neurological:     Mental Status: She is alert.        Assessment:     Cough.  Nonfocal exam.  Suspect acute viral upper respiratory infection.  No respiratory distress    Plan:     -Hycodan cough syrup 1 teaspoon nightly as needed for severe cough -Plenty of fluids and rest -Follow-up immediately for any fever, increased shortness of breath, or other concerns  Eulas Post MD [facility] at [facility]

## 2018-12-20 NOTE — Patient Instructions (Signed)
Cough, Adult  Coughing is a reflex that clears your throat and your airways. Coughing helps to heal and protect your lungs. It is normal to cough occasionally, but a cough that happens with other symptoms or lasts a long time may be a sign of a condition that needs treatment. A cough may last only 2-3 weeks (acute), or it may last longer than 8 weeks (chronic). What are the causes? Coughing is commonly caused by:  Breathing in substances that irritate your lungs.  A viral or bacterial respiratory infection.  Allergies.  Asthma.  Postnasal drip.  Smoking.  Acid backing up from the stomach into the esophagus (gastroesophageal reflux).  Certain medicines.  Chronic lung problems, including COPD (or rarely, lung cancer).  Other medical conditions such as heart failure. Follow these instructions at home: Pay attention to any changes in your symptoms. Take these actions to help with your discomfort:  Take medicines only as told by your health care provider. ? If you were prescribed an antibiotic medicine, take it as told by your health care provider. Do not stop taking the antibiotic even if you start to feel better. ? Talk with your health care provider before you take a cough suppressant medicine.  Drink enough fluid to keep your urine clear or pale yellow.  If the air is dry, use a cold steam vaporizer or humidifier in your bedroom or your home to help loosen secretions.  Avoid anything that causes you to cough at work or at home.  If your cough is worse at night, try sleeping in a semi-upright position.  Avoid cigarette smoke. If you smoke, quit smoking. If you need help quitting, ask your health care provider.  Avoid caffeine.  Avoid alcohol.  Rest as needed. Contact a health care provider if:  You have new symptoms.  You cough up pus.  Your cough does not get better after 2-3 weeks, or your cough gets worse.  You cannot control your cough with suppressant  medicines and you are losing sleep.  You develop pain that is getting worse or pain that is not controlled with pain medicines.  You have a fever.  You have unexplained weight loss.  You have night sweats. Get help right away if:  You cough up blood.  You have difficulty breathing.  Your heartbeat is very fast. This information is not intended to replace advice given to you by your health care provider. Make sure you discuss any questions you have with your health care provider. Document Released: 04/17/2011 Document Revised: 03/26/2016 Document Reviewed: 12/26/2014 Elsevier Interactive Patient Education  2019 Elsevier Inc.  

## 2018-12-27 ENCOUNTER — Other Ambulatory Visit: Payer: Self-pay | Admitting: Family Medicine

## 2019-01-09 ENCOUNTER — Other Ambulatory Visit: Payer: Self-pay

## 2019-01-09 ENCOUNTER — Telehealth: Payer: Self-pay | Admitting: *Deleted

## 2019-01-09 MED ORDER — BENZONATATE 100 MG PO CAPS: 100.0000 mg | ORAL_CAPSULE | Freq: Three times a day (TID) | ORAL | 0 refills | Status: DC | PRN

## 2019-01-09 NOTE — Telephone Encounter (Signed)
Let's try Tessalon perles 100 mg po q 8 hours prn cough.  #30 with 0 refills.  Office follow up if no better with that.

## 2019-01-09 NOTE — Telephone Encounter (Signed)
Called patient and let her know that I have sent over the the prescription to the [facility] and to contact us if she is not any better after about a week of using. Patient verbalized an understanding.

## 2019-01-09 NOTE — Telephone Encounter (Signed)
Please see message.  Please advise. 

## 2019-01-09 NOTE — Telephone Encounter (Signed)
Patient called after hours line and reported her cough hasn't gotten better since she was seen in the office 3 weeks ago. States she was prescribed cough medication but she vomited it up and hasn't taken it since. Can we rx  Her something else for cough?

## 2019-01-16 ENCOUNTER — Other Ambulatory Visit: Payer: Self-pay

## 2019-01-16 ENCOUNTER — Ambulatory Visit (INDEPENDENT_AMBULATORY_CARE_PROVIDER_SITE_OTHER): Payer: PPO | Admitting: Family Medicine

## 2019-01-16 VITALS — BP 118/62 | HR 65 | Temp 97.9°F | Wt 134.5 lb

## 2019-01-16 DIAGNOSIS — K219 Gastro-esophageal reflux disease without esophagitis: Secondary | ICD-10-CM | POA: Diagnosis not present

## 2019-01-16 DIAGNOSIS — M810 Age-related osteoporosis without current pathological fracture: Secondary | ICD-10-CM | POA: Diagnosis not present

## 2019-01-16 DIAGNOSIS — R7303 Prediabetes: Secondary | ICD-10-CM

## 2019-01-16 DIAGNOSIS — E039 Hypothyroidism, unspecified: Secondary | ICD-10-CM

## 2019-01-16 LAB — POCT GLYCOSYLATED HEMOGLOBIN (HGB A1C): HEMOGLOBIN A1C: 5.6 % (ref 4.0–5.6)

## 2019-01-16 NOTE — Progress Notes (Signed)
Subjective:     Patient ID: Julie [name], female   DOB: October 27, 1941, 78 y.o.   MRN: [id number]  HPI Patient here for 17-month follow-up.  She was seen in February with some cough and that is actually improving.  She has had some good success with Tessalon Perles.  History of prediabetes.  A1c 6.4% back in September.  She has lost some weight due to her efforts since then.  No polyuria or polydipsia.  She has scaled back sugars and carbs.  She has history of hypothyroidism and is on replacement and thyroid functions were in normal range last fall  She has hx of GERD and has managed to taper herself off PPI (Omeprazole) with no recent breakthrough symptoms.  Osteoporosis treated with Fosamax.  No recent falls.    Past Medical History:  Diagnosis Date  . Arthritis    lower back shoulder  . Arthropathy, unspecified, site unspecified   . Cancer ([place])    skin -spots on face removed  . Contact dermatitis and other eczema, due to unspecified cause    History  . Depression   . GERD (gastroesophageal reflux disease)    history - no current prob - no med  . Hypertension    was taking lisinopril but had an allergic reaction to medicaion  . 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 Other bursitis disorders    history  . Personal history of colonic polyps   . PONV (postoperative nausea and vomiting)   . Pre-diabetes   . Pure hypercholesterolemia   . Spondylolisthesis, cervical region   . SVD (spontaneous vaginal delivery)    x 1  . Unspecified disorder of thyroid   . Unspecified hypothyroidism    Past Surgical History:  Procedure Laterality Date  . ABDOMINAL HYSTERECTOMY    . ANTERIOR CERVICAL DECOMP/DISCECTOMY FUSION N/A 11/11/2017   Procedure: Cervical seven - Thoracic one  Anterior cervical decompression/discectomy/fusion;  Surgeon: Erline Levine, MD;  Location: [facility];  Service: Neurosurgery;  Laterality: N/A;  . ANTERIOR LAT  LUMBAR FUSION Left 01/22/2017   Procedure: Left Lumbar three-four, Lumbar four-five  Anterior lateral lumbar interbody fusion;  Surgeon: Erline Levine, MD;  Location: [facility];  Service: Neurosurgery;  Laterality: Left;  . BACK SURGERY  2002   L4/L5  . BLADDER SUSPENSION N/A 03/07/2014   Procedure: TRANSVAGINAL TAPE (TVT) PROCEDURE WITH CYSTO;  Surgeon: Princess Bruins, MD;  Location: [facility];  Service: Gynecology;  Laterality: N/A;  vaginal   . blephorplasty      bilateral   . BREAST SURGERY     x 2 breast implants, later removed  . COLONOSCOPY    . CYSTOCELE REPAIR N/A 03/07/2014   Procedure: ANTERIOR REPAIR (CYSTOCELE);  Surgeon: Princess Bruins, MD;  Location: [facility];  Service: Gynecology;  Laterality: N/A;  . devaited septum surgery  1975  . EYE SURGERY     bilateral cataract surgery  . KNEE ARTHROSCOPY  10/21/2012   right - Procedure: ARTHROSCOPY KNEE;  Surgeon: Mcarthur Rossetti, MD;  [facility];  Service: Orthopedics;  Laterality: Left;  Left Knee Arthroscopy, Debridement, partial synovectomy  . LUMBAR LAMINECTOMY    . LUMBAR PERCUTANEOUS PEDICLE SCREW 2 LEVEL N/A 01/22/2017   Procedure: Lumbar Percutaneous Pedicle Screw Placement Lumbar three-four, Lumbar four-five;  Surgeon: Erline Levine, MD;  Location: [facility];  Service: Neurosurgery;  Laterality: N/A;  . ROBOTIC ASSISTED LAPAROSCOPIC SACROCOLPOPEXY N/A 03/07/2014   Procedure:  ROBOTIC ASSISTED LAPAROSCOPIC SACROCOLPOPEXY With Carrolyn Meiers;  Surgeon: Princess Bruins, MD;  Location: [facility];  Service: Gynecology;  Laterality: N/A;  . ROBOTIC ASSISTED SUPRACERVICAL HYSTERECTOMY WITH BILATERAL SALPINGO OOPHERECTOMY N/A 03/07/2014   Procedure: ROBOTIC ASSISTED SUPRACERVICAL HYSTERECTOMY WITH BILATERAL SALPINGO OOPHORECTOMY;  Surgeon: Princess Bruins, MD;  Location: [facility];  Service: Gynecology;  Laterality: N/A;  4 hrs.  . TONSILLECTOMY    . TOTAL KNEE ARTHROPLASTY Left 12/30/2012   Procedure: TOTAL KNEE ARTHROPLASTY;  Surgeon: Mcarthur Rossetti, MD;  [facility];  Service: Orthopedics;  Laterality: Left;  Left Total Knee Arthroplasty  . TUBAL LIGATION    . WISDOM TOOTH EXTRACTION      reports that she has never smoked. She has never used smokeless tobacco. She reports that she does not drink alcohol or use drugs. family history includes Cancer in her mother; Diabetes in her sister; Emphysema in her maternal aunt; Heart attack in her father; Stroke in her father. Allergies  Allergen Reactions  . Penicillins Anaphylaxis    REACTION: causes tongue to swell Has patient had a PCN reaction causing immediate rash, facial/tongue/throat swelling, SOB or lightheadedness with hypotension: Yes swelling Has patient had a PCN reaction causing severe rash involving mucus membranes or skin necrosis:  Has patient had a PCN reaction that required hospitalization no Has patient had a PCN reaction occurring within the last 10 years: no If all of the above answers are "NO", then may proceed with Cephalosporin use.   . Adhesive [Tape] Other (See Comments)    Please use paper tape  . Codeine Nausea Only  . Lisinopril     REACTION: wheezing  . Other Other (See Comments)    Pt was having knee surgery and was given an epidural, which did not work.  [name] [Oxycodone-Acetaminophen] Nausea And Vomiting     Review of Systems  Constitutional: Negative for chills, fatigue and fever.  Eyes: Negative for visual disturbance.  Respiratory: Negative for cough, chest tightness, shortness of breath and wheezing.   Cardiovascular: Negative for chest pain, palpitations and leg swelling.  Endocrine: Negative for polydipsia and polyuria.  Neurological: Negative for dizziness, seizures, syncope, weakness, light-headedness and headaches.       Objective:   Physical Exam Constitutional:      Appearance: She is well-developed.  Eyes:     Pupils: Pupils are equal, round, and reactive to light.  Neck:     Musculoskeletal: Neck supple.      Thyroid: No thyromegaly.     Vascular: No JVD.  Cardiovascular:     Rate and Rhythm: Normal rate and regular rhythm.     Heart sounds: No gallop.   Pulmonary:     Effort: Pulmonary effort is normal. No respiratory distress.     Breath sounds: Normal breath sounds. No wheezing or rales.  Neurological:     Mental Status: She is alert.        Assessment:     #1 history of prediabetes  #2 hypothyroidism  #3 GERD- currently stable off daily medications.  #4 osteoporosis- on Fosamax and tolerating with no problems.     Plan:     -Recheck hemoglobin A1c=5.6% (improved).   -continue with weight control efforts. -routine follow up in 6 months and sooner prn.

## 2019-03-25 ENCOUNTER — Other Ambulatory Visit: Payer: Self-pay | Admitting: Family Medicine

## 2019-06-20 ENCOUNTER — Ambulatory Visit: Payer: PPO

## 2019-06-26 ENCOUNTER — Other Ambulatory Visit: Payer: Self-pay | Admitting: Family Medicine

## 2019-06-30 ENCOUNTER — Other Ambulatory Visit: Payer: Self-pay | Admitting: Family Medicine

## 2019-08-15 LAB — HM DIABETES EYE EXAM

## 2019-08-22 ENCOUNTER — Other Ambulatory Visit: Payer: Self-pay | Admitting: Family Medicine

## 2019-08-25 ENCOUNTER — Other Ambulatory Visit: Payer: Self-pay | Admitting: Family Medicine

## 2019-09-22 ENCOUNTER — Other Ambulatory Visit: Payer: Self-pay | Admitting: Family Medicine

## 2019-09-25 ENCOUNTER — Other Ambulatory Visit: Payer: Self-pay

## 2019-09-25 ENCOUNTER — Telehealth (INDEPENDENT_AMBULATORY_CARE_PROVIDER_SITE_OTHER): Payer: PPO | Admitting: Family Medicine

## 2019-09-25 DIAGNOSIS — R05 Cough: Secondary | ICD-10-CM

## 2019-09-25 DIAGNOSIS — R059 Cough, unspecified: Secondary | ICD-10-CM

## 2019-09-25 DIAGNOSIS — Z20822 Contact with and (suspected) exposure to covid-19: Secondary | ICD-10-CM

## 2019-09-25 DIAGNOSIS — M791 Myalgia, unspecified site: Secondary | ICD-10-CM | POA: Diagnosis not present

## 2019-09-25 NOTE — Progress Notes (Signed)
This visit type was conducted due to national recommendations for restrictions regarding the COVID-19 pandemic in an effort to limit this patient's exposure and mitigate transmission in our community.   Virtual Visit via Video Note  I connected with [name] on 09/25/19 at  4:15 PM EST by a video enabled telemedicine application and verified that I am speaking with the correct person using two identifiers.  Location patient: home Location provider:work or home office Persons participating in the virtual visit: patient, provider  I discussed the limitations of evaluation and management by telemedicine and the availability of in person appointments. The patient expressed understanding and agreed to proceed.   HPI: [name] is seen today via video enabled visit for low-grade fever.  She is also had body aches and some cough and mild shortness of breath.  She states last week she was traveling down in [place] visiting a granddaughter.  They went out to several restaurants.  She started having symptoms on Sunday.  She went for Covid testing earlier today.  She is also had some nasal congestion.  No diarrhea symptoms.  No loss of taste or smell.  No other sick contacts.  Husband is currently asymptomatic but they have been staying very separated   ROS: See pertinent positives and negatives per HPI.  Past Medical History:  Diagnosis Date  . Arthritis    lower back shoulder  . Arthropathy, unspecified, site unspecified   . Cancer ([place])    skin -spots on face removed  . Contact dermatitis and other eczema, due to unspecified cause    History  . Depression   . GERD (gastroesophageal reflux disease)    history - no current prob - no med  . Hypertension    was taking lisinopril but had an allergic reaction to medicaion  . 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 Other bursitis disorders    history  . Personal history of colonic polyps    . PONV (postoperative nausea and vomiting)   . Pre-diabetes   . Pure hypercholesterolemia   . Spondylolisthesis, cervical region   . SVD (spontaneous vaginal delivery)    x 1  . Unspecified disorder of thyroid   . Unspecified hypothyroidism     Past Surgical History:  Procedure Laterality Date  . ABDOMINAL HYSTERECTOMY    . ANTERIOR CERVICAL DECOMP/DISCECTOMY FUSION N/A 11/11/2017   Procedure: Cervical seven - Thoracic one  Anterior cervical decompression/discectomy/fusion;  Surgeon: Erline Levine, MD;  Location: [facility];  Service: Neurosurgery;  Laterality: N/A;  . ANTERIOR LAT LUMBAR FUSION Left 01/22/2017   Procedure: Left Lumbar three-four, Lumbar four-five  Anterior lateral lumbar interbody fusion;  Surgeon: Erline Levine, MD;  Location: [facility];  Service: Neurosurgery;  Laterality: Left;  . BACK SURGERY  2002   L4/L5  . BLADDER SUSPENSION N/A 03/07/2014   Procedure: TRANSVAGINAL TAPE (TVT) PROCEDURE WITH CYSTO;  Surgeon: Princess Bruins, MD;  Location: [facility];  Service: Gynecology;  Laterality: N/A;  vaginal   . blephorplasty      bilateral   . BREAST SURGERY     x 2 breast implants, later removed  . COLONOSCOPY    . CYSTOCELE REPAIR N/A 03/07/2014   Procedure: ANTERIOR REPAIR (CYSTOCELE);  Surgeon: Princess Bruins, MD;  Location: [facility];  Service: Gynecology;  Laterality: N/A;  . devaited septum surgery  1975  . EYE SURGERY     bilateral cataract surgery  . KNEE  ARTHROSCOPY  10/21/2012   right - Procedure: ARTHROSCOPY KNEE;  Surgeon: Mcarthur Rossetti, MD;  Location: [facility];  Service: Orthopedics;  Laterality: Left;  Left Knee Arthroscopy, Debridement, partial synovectomy  . LUMBAR LAMINECTOMY    . LUMBAR PERCUTANEOUS PEDICLE SCREW 2 LEVEL N/A 01/22/2017   Procedure: Lumbar Percutaneous Pedicle Screw Placement Lumbar three-four, Lumbar four-five;  Surgeon: Erline Levine, MD;  Location: [facility];  Service: Neurosurgery;  Laterality: N/A;  . ROBOTIC ASSISTED LAPAROSCOPIC  SACROCOLPOPEXY N/A 03/07/2014   Procedure: ROBOTIC ASSISTED LAPAROSCOPIC SACROCOLPOPEXY With Carrolyn Meiers;  Surgeon: Princess Bruins, MD;  Location: [facility];  Service: Gynecology;  Laterality: N/A;  . ROBOTIC ASSISTED SUPRACERVICAL HYSTERECTOMY WITH BILATERAL SALPINGO OOPHERECTOMY N/A 03/07/2014   Procedure: ROBOTIC ASSISTED SUPRACERVICAL HYSTERECTOMY WITH BILATERAL SALPINGO OOPHORECTOMY;  Surgeon: Princess Bruins, MD;  Location: [facility];  Service: Gynecology;  Laterality: N/A;  4 hrs.  . TONSILLECTOMY    . TOTAL KNEE ARTHROPLASTY Left 12/30/2012   Procedure: TOTAL KNEE ARTHROPLASTY;  Surgeon: Mcarthur Rossetti, MD;  Location: [facility];  Service: Orthopedics;  Laterality: Left;  Left Total Knee Arthroplasty  . TUBAL LIGATION    . WISDOM TOOTH EXTRACTION      Family History  Problem Relation Age of Onset  . Cancer Mother        breast and lung  . Stroke Father   . Heart attack Father   . Diabetes Sister   . Emphysema Maternal Aunt   . Colon cancer Neg Hx     SOCIAL HX: Non-smoker   Current Outpatient Medications:  .  alendronate (FOSAMAX) 70 MG tablet, TAKE 1 TABLET BY MOUTH EVERY 7 DAYS WITH A FULL GLASS OF WATER ON AN EMPTY STOMACH, Disp: 4 tablet, Rfl: 11 .  benzonatate (TESSALON) 100 MG capsule, Take 1 capsule (100 mg total) by mouth every 8 (eight) hours as needed for cough. Please schedule a follow up appointment if not helping., Disp: 30 capsule, Rfl: 0 .  Bilberry, Vaccinium myrtillus, (BILBERRY PO), Take 1,200 mg elemental calcium/kg/hr by mouth daily., Disp: , Rfl:  .  Biotin 3 MG TABS, Take 1 tablet by mouth daily., Disp: , Rfl:  .  Calcium Carb-Cholecalciferol (CALCIUM 500 + D3 PO), Take 1 tablet by mouth daily. , Disp: , Rfl:  .  Cholecalciferol 5000 UNITS capsule, Take 5,000 Units by mouth daily., Disp: , Rfl:  .  Cinnamon 500 MG TABS, Take 1 tablet by mouth daily., Disp: , Rfl:  .  colchicine (COLCRYS) 0.6 MG tablet, Take 1 tablet (0.6 mg total) by mouth 2 (two) times daily  as needed. (Patient taking differently: Take 0.6 mg by mouth as needed. ), Disp: 60 tablet, Rfl: 3 .  Cyanocobalamin (VITAMIN B-12 PO), Take 5,000 mcg by mouth daily., Disp: , Rfl:  .  loratadine (CLARITIN) 10 MG tablet, Take 1 tablet (10 mg total) by mouth daily. (Patient taking differently: Take 10 mg by mouth as needed. ), Disp: 30 tablet, Rfl: 0 .  Multiple Vitamins-Minerals (AIRBORNE) CHEW, Chew 1 tablet by mouth daily., Disp: , Rfl:  .  thyroid (NP THYROID) 30 MG tablet, TAKE 1 TABLET BY MOUTH EVERY DAY, Disp: 15 tablet, Rfl: 0 .  TURMERIC CURCUMIN PO, Take 1,000 mg by mouth daily., Disp: , Rfl:  .  vitamin E 400 UNIT capsule, Take 1 tablet by mouth daily., Disp: , Rfl:   EXAM:  VITALS per patient if applicable:  GENERAL: alert, oriented, appears well and in no acute distress  HEENT: atraumatic, conjunttiva clear,  no obvious abnormalities on inspection of external nose and ears  NECK: normal movements of the head and neck  LUNGS: on inspection no signs of respiratory distress, breathing rate appears normal, no obvious gross SOB, gasping or wheezing  CV: no obvious cyanosis  MS: moves all visible extremities without noticeable abnormality  PSYCH/NEURO: pleasant and cooperative, no obvious depression or anxiety, speech and thought processing grossly intact  ASSESSMENT AND PLAN:  Discussed the following assessment and plan:  Acute upper respiratory illness.  Rule out Covid infection  -Covid testing earlier today as above -Plenty of fluids and rest.  She will continue with Tylenol as needed for body aches and low-grade fever -Patient knows to go directly to ER for any increased shortness of breath.  Otherwise, she knows to stay quarantined a minimum of 10 days from onset of symptoms     I discussed the assessment and treatment plan with the patient. The patient was provided an opportunity to ask questions and all were answered. The patient agreed with the plan and demonstrated  an understanding of the instructions.   The patient was advised to call back or seek an in-person evaluation if the symptoms worsen or if the condition fails to improve as anticipated.     Carolann Littler, MD

## 2019-09-27 ENCOUNTER — Telehealth: Payer: Self-pay

## 2019-09-27 ENCOUNTER — Encounter: Payer: Self-pay | Admitting: Family Medicine

## 2019-09-27 ENCOUNTER — Other Ambulatory Visit: Payer: Self-pay

## 2019-09-27 LAB — NOVEL CORONAVIRUS, NAA: SARS-CoV-2, NAA: DETECTED — AB

## 2019-09-27 MED ORDER — ALBUTEROL SULFATE HFA 108 (90 BASE) MCG/ACT IN AERS: 2.0000 | INHALATION_SPRAY | RESPIRATORY_TRACT | 0 refills | Status: DC | PRN

## 2019-09-27 NOTE — Telephone Encounter (Signed)
Patient sent a MyChart message wanting to know if her COVID 19 test was negative. I saw that it was positive and immediately called this patient. I advised patient and she stated that she has been quarantined and her husband is living in another room and bathroom. Patient stated that her husband will go to have test done today. I went over protocol for positive COVID 19 and patient verbalized an understanding. Patient stated that no one had called her to give her the results. I also do not see a message from the [facility] to contact patient about the results.

## 2019-10-05 ENCOUNTER — Encounter: Payer: Self-pay | Admitting: Family Medicine

## 2019-10-06 ENCOUNTER — Encounter: Payer: Self-pay | Admitting: Family Medicine

## 2019-10-07 ENCOUNTER — Encounter: Payer: Self-pay | Admitting: Family Medicine

## 2019-10-09 DIAGNOSIS — Z03818 Encounter for observation for suspected exposure to other biological agents ruled out: Secondary | ICD-10-CM | POA: Diagnosis not present

## 2019-10-09 DIAGNOSIS — Z9189 Other specified personal risk factors, not elsewhere classified: Secondary | ICD-10-CM | POA: Diagnosis not present

## 2019-10-10 ENCOUNTER — Other Ambulatory Visit: Payer: Self-pay | Admitting: Family Medicine

## 2019-11-14 ENCOUNTER — Other Ambulatory Visit: Payer: Self-pay | Admitting: Family Medicine

## 2019-11-19 ENCOUNTER — Encounter: Payer: Self-pay | Admitting: Family Medicine

## 2019-11-29 ENCOUNTER — Telehealth: Payer: Self-pay

## 2019-11-29 ENCOUNTER — Other Ambulatory Visit: Payer: Self-pay

## 2019-11-29 ENCOUNTER — Encounter: Payer: Self-pay | Admitting: Family Medicine

## 2019-11-29 ENCOUNTER — Telehealth (INDEPENDENT_AMBULATORY_CARE_PROVIDER_SITE_OTHER): Payer: PPO | Admitting: Family Medicine

## 2019-11-29 DIAGNOSIS — F5104 Psychophysiologic insomnia: Secondary | ICD-10-CM

## 2019-11-29 MED ORDER — TRAZODONE HCL 50 MG PO TABS: 25.0000 mg | ORAL_TABLET | Freq: Every evening | ORAL | 3 refills | Status: DC | PRN

## 2019-11-29 NOTE — Progress Notes (Signed)
This visit type was conducted due to national recommendations for restrictions regarding the COVID-19 pandemic in an effort to limit this patient's exposure and mitigate transmission in our community.   Virtual Visit via Telephone Note  I connected with [name] on 11/29/19 at  4:30 PM EST by telephone and verified that I am speaking with the correct person using two identifiers.   I discussed the limitations, risks, security and privacy concerns of performing an evaluation and management service by telephone and the availability of in person appointments. I also discussed with the patient that there may be a patient responsible charge related to this service. The patient expressed understanding and agreed to proceed.  Location patient: home Location provider: work or home office Participants present for the call: patient, provider Patient did not have a visit in the prior 7 days to address this/these issue(s).   History of Present Illness: Julie [name] called with chronic issues of insomnia.  This has been particularly bothersome recently.  She has difficulty falling asleep and sometimes only gets a few hours sleep per night.  She feels very tired and fatigued most of the day.  She had this problem now for several years.  She briefly took Ambien.  She has tried things such as NyQuil, melatonin, Benadryl without relief.  She has tried white noise without improvement.  She has tried behavioral modification.  She does not drink any alcohol.  She drinks a small amount of green tea in the morning but never late in the day.  No daytime naps.  Tries to avoid bright lights at night.  Stimulant or decongestant use.  Past Medical History:  Diagnosis Date  . Arthritis    lower back shoulder  . Arthropathy, unspecified, site unspecified   . Cancer ([place])    skin -spots on face removed  . Contact dermatitis and other eczema, due to unspecified cause    History  . Depression   . GERD (gastroesophageal  reflux disease)    history - no current prob - no med  . Hypertension    was taking lisinopril but had an allergic reaction to medicaion  . 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 Other bursitis disorders    history  . Personal history of colonic polyps   . PONV (postoperative nausea and vomiting)   . Pre-diabetes   . Pure hypercholesterolemia   . Spondylolisthesis, cervical region   . SVD (spontaneous vaginal delivery)    x 1  . Unspecified disorder of thyroid   . Unspecified hypothyroidism    Past Surgical History:  Procedure Laterality Date  . ABDOMINAL HYSTERECTOMY    . ANTERIOR CERVICAL DECOMP/DISCECTOMY FUSION N/A 11/11/2017   Procedure: Cervical seven - Thoracic one  Anterior cervical decompression/discectomy/fusion;  Surgeon: Erline Levine, MD;  Location: [facility];  Service: Neurosurgery;  Laterality: N/A;  . ANTERIOR LAT LUMBAR FUSION Left 01/22/2017   Procedure: Left Lumbar three-four, Lumbar four-five  Anterior lateral lumbar interbody fusion;  Surgeon: Erline Levine, MD;  Location: [facility];  Service: Neurosurgery;  Laterality: Left;  . BACK SURGERY  2002   L4/L5  . BLADDER SUSPENSION N/A 03/07/2014   Procedure: TRANSVAGINAL TAPE (TVT) PROCEDURE WITH CYSTO;  Surgeon: Princess Bruins, MD;  Location: [facility];  Service: Gynecology;  Laterality: N/A;  vaginal   . blephorplasty      bilateral   . BREAST SURGERY     x 2 breast implants, later  removed  . COLONOSCOPY    . CYSTOCELE REPAIR N/A 03/07/2014   Procedure: ANTERIOR REPAIR (CYSTOCELE);  Surgeon: Princess Bruins, MD;  Location: [facility];  Service: Gynecology;  Laterality: N/A;  . devaited septum surgery  1975  . EYE SURGERY     bilateral cataract surgery  . KNEE ARTHROSCOPY  10/21/2012   right - Procedure: ARTHROSCOPY KNEE;  Surgeon: Mcarthur Rossetti, MD;  Location: [facility];  Service: Orthopedics;  Laterality: Left;  Left Knee Arthroscopy, Debridement, partial synovectomy   . LUMBAR LAMINECTOMY    . LUMBAR PERCUTANEOUS PEDICLE SCREW 2 LEVEL N/A 01/22/2017   Procedure: Lumbar Percutaneous Pedicle Screw Placement Lumbar three-four, Lumbar four-five;  Surgeon: Erline Levine, MD;  Location: [facility];  Service: Neurosurgery;  Laterality: N/A;  . ROBOTIC ASSISTED LAPAROSCOPIC SACROCOLPOPEXY N/A 03/07/2014   Procedure: ROBOTIC ASSISTED LAPAROSCOPIC SACROCOLPOPEXY With Carrolyn Meiers;  Surgeon: Princess Bruins, MD;  Location: [facility];  Service: Gynecology;  Laterality: N/A;  . ROBOTIC ASSISTED SUPRACERVICAL HYSTERECTOMY WITH BILATERAL SALPINGO OOPHERECTOMY N/A 03/07/2014   Procedure: ROBOTIC ASSISTED SUPRACERVICAL HYSTERECTOMY WITH BILATERAL SALPINGO OOPHORECTOMY;  Surgeon: Princess Bruins, MD;  Location: [facility];  Service: Gynecology;  Laterality: N/A;  4 hrs.  . TONSILLECTOMY    . TOTAL KNEE ARTHROPLASTY Left 12/30/2012   Procedure: TOTAL KNEE ARTHROPLASTY;  Surgeon: Mcarthur Rossetti, MD;  Location: [facility];  Service: Orthopedics;  Laterality: Left;  Left Total Knee Arthroplasty  . TUBAL LIGATION    . WISDOM TOOTH EXTRACTION      reports that she has never smoked. She has never used smokeless tobacco. She reports that she does not drink alcohol or use drugs. family history includes Cancer in her mother; Diabetes in her sister; Emphysema in her maternal aunt; Heart attack in her father; Stroke in her father. Allergies  Allergen Reactions  . Penicillins Anaphylaxis    REACTION: causes tongue to swell Has patient had a PCN reaction causing immediate rash, facial/tongue/throat swelling, SOB or lightheadedness with hypotension: Yes swelling Has patient had a PCN reaction causing severe rash involving mucus membranes or skin necrosis:  Has patient had a PCN reaction that required hospitalization no Has patient had a PCN reaction occurring within the last 10 years: no If all of the above answers are "NO", then may proceed with Cephalosporin use.   . Adhesive [Tape] Other (See  Comments)    Please use paper tape  . Codeine Nausea Only  . Lisinopril     REACTION: wheezing  . Other Other (See Comments)    Pt was having knee surgery and was given an epidural, which did not work.  [name] [Oxycodone-Acetaminophen] Nausea And Vomiting      Observations/Objective: Patient sounds cheerful and well on the phone. I do not appreciate any SOB. Speech and thought processing are grossly intact. Patient reported vitals:  Assessment and Plan: Chronic insomnia.  She has tried multiple over-the-counter things as above without improvement.  We discussed several issues as follows  -Discussed nonpharmacologic factors (for example, avoiding bright lights at night right before bedtime), avoiding heavy eating before bedtime, avoidance of caffeine in the afternoons at night  -Consider trial of trazodone 50 mg nightly  -Avoid benzodiazepines and we would also preferably avoid medications such as Ambien for long-term use  -We will schedule follow-up in 2 weeks to reassess  Follow Up Instructions:  -2 weeks   99441 5-10 [id number] 11-20 [id number] 21-30 I did not refer this patient for an OV in the next 24 hours for this/these  issue(s).  I discussed the assessment and treatment plan with the patient. The patient was provided an opportunity to ask questions and all were answered. The patient agreed with the plan and demonstrated an understanding of the instructions.   The patient was advised to call back or seek an in-person evaluation if the symptoms worsen or if the condition fails to improve as anticipated.  I provided 18 minutes of non-face-to-face time during this encounter.   Carolann Littler, MD

## 2019-11-29 NOTE — Telephone Encounter (Signed)
Called and left a detailed voice message to let patient know that she needs to call and schedule a virtual visit or telephone visit so we can help her sleep better per her MyChart message.

## 2019-12-01 ENCOUNTER — Ambulatory Visit: Payer: PPO

## 2019-12-07 ENCOUNTER — Ambulatory Visit: Payer: PPO | Attending: Internal Medicine

## 2019-12-07 DIAGNOSIS — Z23 Encounter for immunization: Secondary | ICD-10-CM | POA: Insufficient documentation

## 2019-12-07 NOTE — Progress Notes (Signed)
   Covid-19 Vaccination Clinic  Name:  Julie [name]    MRN: [id number] DOB: 03-14-1941  12/07/2019  [name] was observed post Covid-19 immunization for 15 minutes without incidence. She was provided with Vaccine Information Sheet and instruction to access the V-Safe system.   [name] was instructed to call 911 with any severe reactions post vaccine: Marland Kitchen Difficulty breathing  . Swelling of your face and throat  . A fast heartbeat  . A bad rash all over your body  . Dizziness and weakness    Immunizations Administered    Name Date Dose VIS Date Route   Pfizer COVID-19 Vaccine 12/07/2019 11:51 AM 0.3 mL 10/13/2019 Intramuscular   Manufacturer: [employer]   Lot: [id number]   [place]: [id number]

## 2019-12-13 ENCOUNTER — Telehealth: Payer: PPO | Admitting: Family Medicine

## 2019-12-13 ENCOUNTER — Telehealth (INDEPENDENT_AMBULATORY_CARE_PROVIDER_SITE_OTHER): Payer: PPO | Admitting: Family Medicine

## 2019-12-13 ENCOUNTER — Other Ambulatory Visit: Payer: Self-pay

## 2019-12-13 DIAGNOSIS — F5101 Primary insomnia: Secondary | ICD-10-CM

## 2019-12-13 DIAGNOSIS — R7303 Prediabetes: Secondary | ICD-10-CM

## 2019-12-13 DIAGNOSIS — M810 Age-related osteoporosis without current pathological fracture: Secondary | ICD-10-CM | POA: Diagnosis not present

## 2019-12-13 DIAGNOSIS — E039 Hypothyroidism, unspecified: Secondary | ICD-10-CM

## 2019-12-13 MED ORDER — THYROID 30 MG PO TABS: ORAL_TABLET | ORAL | 3 refills | Status: DC

## 2019-12-13 NOTE — Progress Notes (Signed)
This visit type was conducted due to national recommendations for restrictions regarding the COVID-19 pandemic in an effort to limit this patient's exposure and mitigate transmission in our community.   Virtual Visit via Telephone Note  I connected with [name] on 12/13/19 at  4:00 PM EST by telephone and verified that I am speaking with the correct person using two identifiers.   I discussed the limitations, risks, security and privacy concerns of performing an evaluation and management service by telephone and the availability of in person appointments. I also discussed with the patient that there may be a patient responsible charge related to this service. The patient expressed understanding and agreed to proceed.  Location patient: home Location provider: work or home office Participants present for the call: patient, provider Patient did not have a visit in the prior 7 days to address this/these issue(s).   History of Present Illness: Julie [name] had set up virtual to discuss several issues as follows  First was recent insomnia.  Refer to last note.  We started trazodone and she is getting very good relief with insomnia with 50 mg nightly.  No adverse side effects.  Has occasionally taken 2 tablets at night for severe insomnia.  Overall pleased with the results.  History of hypothyroidism.  She takes low-dose replacement and has not had labs in over a year.  Requesting refills.  History of osteoporosis.  She has been on Fosamax now for 5 years and we had discussed previously taking break off this after 5-year interval.  History of prediabetes.  Last A1c was well controlled.  No recent polyuria or polydipsia.  Past Medical History:  Diagnosis Date  . Arthritis    lower back shoulder  . Arthropathy, unspecified, site unspecified   . Cancer ([place])    skin -spots on face removed  . Contact dermatitis and other eczema, due to unspecified cause    History  . Depression   . GERD  (gastroesophageal reflux disease)    history - no current prob - no med  . Hypertension    was taking lisinopril but had an allergic reaction to medicaion  . 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 Other bursitis disorders    history  . Personal history of colonic polyps   . PONV (postoperative nausea and vomiting)   . Pre-diabetes   . Pure hypercholesterolemia   . Spondylolisthesis, cervical region   . SVD (spontaneous vaginal delivery)    x 1  . Unspecified disorder of thyroid   . Unspecified hypothyroidism    Past Surgical History:  Procedure Laterality Date  . ABDOMINAL HYSTERECTOMY    . ANTERIOR CERVICAL DECOMP/DISCECTOMY FUSION N/A 11/11/2017   Procedure: Cervical seven - Thoracic one  Anterior cervical decompression/discectomy/fusion;  Surgeon: Erline Levine, MD;  Location: [facility];  Service: Neurosurgery;  Laterality: N/A;  . ANTERIOR LAT LUMBAR FUSION Left 01/22/2017   Procedure: Left Lumbar three-four, Lumbar four-five  Anterior lateral lumbar interbody fusion;  Surgeon: Erline Levine, MD;  Location: [facility];  Service: Neurosurgery;  Laterality: Left;  . BACK SURGERY  2002   L4/L5  . BLADDER SUSPENSION N/A 03/07/2014   Procedure: TRANSVAGINAL TAPE (TVT) PROCEDURE WITH CYSTO;  Surgeon: Princess Bruins, MD;  Location: [facility];  Service: Gynecology;  Laterality: N/A;  vaginal   . blephorplasty      bilateral   . BREAST SURGERY     x 2 breast implants, later  removed  . COLONOSCOPY    . CYSTOCELE REPAIR N/A 03/07/2014   Procedure: ANTERIOR REPAIR (CYSTOCELE);  Surgeon: Princess Bruins, MD;  Location: [facility];  Service: Gynecology;  Laterality: N/A;  . devaited septum surgery  1975  . EYE SURGERY     bilateral cataract surgery  . KNEE ARTHROSCOPY  10/21/2012   right - Procedure: ARTHROSCOPY KNEE;  Surgeon: Mcarthur Rossetti, MD;  Location[facility];  Service: Orthopedics;  Laterality: Left;  Left Knee Arthroscopy, Debridement,  partial synovectomy  . LUMBAR LAMINECTOMY    . LUMBAR PERCUTANEOUS PEDICLE SCREW 2 LEVEL N/A 01/22/2017   Procedure: Lumbar Percutaneous Pedicle Screw Placement Lumbar three-four, Lumbar four-five;  Surgeon: Erline Levine, MD;  Location: [facility];  Service: Neurosurgery;  Laterality: N/A;  . ROBOTIC ASSISTED LAPAROSCOPIC SACROCOLPOPEXY N/A 03/07/2014   Procedure: ROBOTIC ASSISTED LAPAROSCOPIC SACROCOLPOPEXY With Carrolyn Meiers;  Surgeon: Princess Bruins, MD;  Location: [facility];  Service: Gynecology;  Laterality: N/A;  . ROBOTIC ASSISTED SUPRACERVICAL HYSTERECTOMY WITH BILATERAL SALPINGO OOPHERECTOMY N/A 03/07/2014   Procedure: ROBOTIC ASSISTED SUPRACERVICAL HYSTERECTOMY WITH BILATERAL SALPINGO OOPHORECTOMY;  Surgeon: Princess Bruins, MD;  Location: [facility];  Service: Gynecology;  Laterality: N/A;  4 hrs.  . TONSILLECTOMY    . TOTAL KNEE ARTHROPLASTY Left 12/30/2012   Procedure: TOTAL KNEE ARTHROPLASTY;  Surgeon: Mcarthur Rossetti, MD;  [facility];  Service: Orthopedics;  Laterality: Left;  Left Total Knee Arthroplasty  . TUBAL LIGATION    . WISDOM TOOTH EXTRACTION      reports that she has never smoked. She has never used smokeless tobacco. She reports that she does not drink alcohol or use drugs. family history includes Cancer in her mother; Diabetes in her sister; Emphysema in her maternal aunt; Heart attack in her father; Stroke in her father. Allergies  Allergen Reactions  . Penicillins Anaphylaxis    REACTION: causes tongue to swell Has patient had a PCN reaction causing immediate rash, facial/tongue/throat swelling, SOB or lightheadedness with hypotension: Yes swelling Has patient had a PCN reaction causing severe rash involving mucus membranes or skin necrosis:  Has patient had a PCN reaction that required hospitalization no Has patient had a PCN reaction occurring within the last 10 years: no If all of the above answers are "NO", then may proceed with Cephalosporin use.   . Adhesive  [Tape] Other (See Comments)    Please use paper tape  . Codeine Nausea Only  . Lisinopril     REACTION: wheezing  . Other Other (See Comments)    Pt was having knee surgery and was given an epidural, which did not work.  [name] [Oxycodone-Acetaminophen] Nausea And Vomiting      Observations/Objective: Patient sounds cheerful and well on the phone. I do not appreciate any SOB. Speech and thought processing are grossly intact. Patient reported vitals:  Assessment and Plan:   #1 insomnia.  Improved on trazodone  -Continue good sleep hygiene as discussed previously -Continue trazodone 50 mg nightly as needed  #2 hypothyroidism.  Patient is overdue for lab work  -Refill thyroid medication -Future lab order for TSH and she will come next week for that  #3 history of prediabetes-stable by previous labs  -Recheck A1c.  Future lab order placed.  Continue low glycemic diet.  #4 osteoporosis history.  She has completed 5 years of Fosamax  -Discontinue Fosamax at this time -She has not apparently had recent DEXA scan and we recommend getting that at some point soon  Follow Up Instructions:  -As  above.  We will schedule future labs for next Monday around 2 PM   99441 5-10 99442 11-20 99443 21-30 I did not refer this patient for an OV in the next 24 hours for this/these issue(s).  I discussed the assessment and treatment plan with the patient. The patient was provided an opportunity to ask questions and all were answered. The patient agreed with the plan and demonstrated an understanding of the instructions.   The patient was advised to call back or seek an in-person evaluation if the symptoms worsen or if the condition fails to improve as anticipated.  I provided 25 minutes of non-face-to-face time during this encounter.   Carolann Littler, MD

## 2019-12-14 ENCOUNTER — Other Ambulatory Visit: Payer: Self-pay

## 2019-12-14 ENCOUNTER — Telehealth: Payer: Self-pay | Admitting: Family Medicine

## 2019-12-14 ENCOUNTER — Encounter: Payer: Self-pay | Admitting: Family Medicine

## 2019-12-14 DIAGNOSIS — R5383 Other fatigue: Secondary | ICD-10-CM

## 2019-12-14 NOTE — Telephone Encounter (Signed)
Pt want dr Elease Hashimoto to add vit D and B to her labs tor tomorrow

## 2019-12-15 ENCOUNTER — Other Ambulatory Visit: Payer: Self-pay

## 2019-12-15 ENCOUNTER — Other Ambulatory Visit: Payer: PPO

## 2019-12-15 DIAGNOSIS — R7303 Prediabetes: Secondary | ICD-10-CM | POA: Diagnosis not present

## 2019-12-15 DIAGNOSIS — E039 Hypothyroidism, unspecified: Secondary | ICD-10-CM

## 2019-12-15 DIAGNOSIS — M810 Age-related osteoporosis without current pathological fracture: Secondary | ICD-10-CM

## 2019-12-15 NOTE — Telephone Encounter (Signed)
I added CBC to labs drawn today

## 2019-12-15 NOTE — Telephone Encounter (Signed)
Okay to add 25 hydroxy vitamin D.  She has diagnosis of osteoporosis

## 2019-12-15 NOTE — Telephone Encounter (Signed)
I am not sure why she is calling back to have this added.  This is the third labs she has called to add on.  We do not see a clear code to get this covered.  We are happy to check her B12 level but without a valid code she would have to pay out-of-pocket.

## 2019-12-15 NOTE — Addendum Note (Signed)
Addended by: Suzette Battiest on: 12/15/2019 12:59 PM   Modules accepted: Orders

## 2019-12-15 NOTE — Telephone Encounter (Signed)
Okay to add? Dx Code?

## 2019-12-15 NOTE — Telephone Encounter (Signed)
Okay to add B12? Dx Code?

## 2019-12-16 ENCOUNTER — Encounter: Payer: Self-pay | Admitting: Family Medicine

## 2019-12-16 LAB — VITAMIN D 25 HYDROXY (VIT D DEFICIENCY, FRACTURES): Vit D, 25-Hydroxy: 41 ng/mL (ref 30–100)

## 2019-12-16 LAB — HEMOGLOBIN A1C
Hgb A1c MFr Bld: 6 % of total Hgb — ABNORMAL HIGH (ref <5.7)
Mean Plasma Glucose: 126 (calc)
eAG (mmol/L): 7 (calc)

## 2019-12-16 LAB — IRON, TOTAL/TOTAL IRON BINDING CAP
%SAT: 12 % (calc) — ABNORMAL LOW (ref 16–45)
Iron: 45 ug/dL (ref 45–160)
TIBC: 364 mcg/dL (calc) (ref 250–450)

## 2019-12-16 LAB — TSH: TSH: 2.18 mIU/L (ref 0.40–4.50)

## 2020-01-02 ENCOUNTER — Ambulatory Visit: Payer: PPO | Attending: Internal Medicine

## 2020-01-02 DIAGNOSIS — Z23 Encounter for immunization: Secondary | ICD-10-CM

## 2020-01-02 NOTE — Progress Notes (Signed)
   Covid-19 Vaccination Clinic  Name:  Julie [name]    MRN: [id number] DOB: 11/09/1940  01/02/2020  [name] was observed post Covid-19 immunization for 15 minutes without incident. She was provided with Vaccine Information Sheet and instruction to access the V-Safe system.   [name] was instructed to call 911 with any severe reactions post vaccine: Marland Kitchen Difficulty breathing  . Swelling of face and throat  . A fast heartbeat  . A bad rash all over body  . Dizziness and weakness   Immunizations Administered    Name Date Dose VIS Date Route   Pfizer COVID-19 Vaccine 01/02/2020 11:28 AM 0.3 mL 10/13/2019 Intramuscular   Manufacturer: [place]   Lot: [id number]   [place]: [id number]

## 2020-01-08 ENCOUNTER — Other Ambulatory Visit: Payer: Self-pay | Admitting: Family Medicine

## 2020-02-13 DIAGNOSIS — H35372 Puckering of macula, left eye: Secondary | ICD-10-CM | POA: Diagnosis not present

## 2020-02-13 DIAGNOSIS — H04123 Dry eye syndrome of bilateral lacrimal glands: Secondary | ICD-10-CM | POA: Diagnosis not present

## 2020-03-27 ENCOUNTER — Other Ambulatory Visit: Payer: Self-pay | Admitting: Family Medicine

## 2020-04-10 DIAGNOSIS — J069 Acute upper respiratory infection, unspecified: Secondary | ICD-10-CM | POA: Diagnosis not present

## 2020-04-10 DIAGNOSIS — J018 Other acute sinusitis: Secondary | ICD-10-CM | POA: Diagnosis not present

## 2020-04-10 DIAGNOSIS — R05 Cough: Secondary | ICD-10-CM | POA: Diagnosis not present

## 2020-04-17 ENCOUNTER — Telehealth: Payer: Self-pay | Admitting: Family Medicine

## 2020-04-17 NOTE — Telephone Encounter (Signed)
Spoke with patient she stated she was at work and would like a call back tomorrow at [employer]

## 2020-05-07 DIAGNOSIS — M545 Low back pain: Secondary | ICD-10-CM | POA: Diagnosis not present

## 2020-05-07 DIAGNOSIS — R109 Unspecified abdominal pain: Secondary | ICD-10-CM | POA: Diagnosis not present

## 2020-05-09 ENCOUNTER — Telehealth: Payer: Self-pay | Admitting: Family Medicine

## 2020-05-09 NOTE — Telephone Encounter (Signed)
Left message for patient to schedule Annual Wellness Visit.  Please schedule with Nurse Health Advisor Shannon Crews, RN at [facility]  

## 2020-05-30 DIAGNOSIS — R109 Unspecified abdominal pain: Secondary | ICD-10-CM | POA: Diagnosis not present

## 2020-05-30 DIAGNOSIS — M545 Low back pain: Secondary | ICD-10-CM | POA: Diagnosis not present

## 2020-05-30 DIAGNOSIS — L309 Dermatitis, unspecified: Secondary | ICD-10-CM | POA: Diagnosis not present

## 2020-06-10 ENCOUNTER — Telehealth: Payer: Self-pay | Admitting: *Deleted

## 2020-06-10 NOTE — Telephone Encounter (Signed)
I called patient and strongly advised that she call 911 or go to the ER promptly.  She still seems undecided.  She states she wishes to discuss with her husband.  We have recommend that she not wait.  She is having chest tightness along with some shortness of breath and radiation toward the neck which is very worrisome by history.  We explained waiting could lead to adverse outcome including death if this is cardiac related.

## 2020-06-10 NOTE — Telephone Encounter (Signed)
Patient called nurse triage line on  06/10/2020 at 11:08:53 AM. -The caller states that she had severe pain in her back and in her neck. She is also having shortness of breath. Chest feels heavy. Vein in her neck look swollen. Advice given to patient: Care Advice Given Per Guideline CALL EMS 911 NOW: Referrals GO TO FACILITY REFUSE

## 2020-06-10 NOTE — Telephone Encounter (Signed)
Fyi pt refused to go to call 911

## 2020-06-21 ENCOUNTER — Encounter: Payer: Self-pay | Admitting: Family Medicine

## 2020-06-21 DIAGNOSIS — R0789 Other chest pain: Secondary | ICD-10-CM

## 2020-06-22 ENCOUNTER — Encounter: Payer: Self-pay | Admitting: Family Medicine

## 2020-07-12 ENCOUNTER — Other Ambulatory Visit: Payer: Self-pay | Admitting: Family Medicine

## 2020-07-13 NOTE — Telephone Encounter (Signed)
Refill for 6 months. 

## 2020-08-19 ENCOUNTER — Ambulatory Visit: Payer: PPO | Admitting: Interventional Cardiology

## 2020-08-19 ENCOUNTER — Other Ambulatory Visit: Payer: Self-pay

## 2020-08-19 ENCOUNTER — Encounter: Payer: Self-pay | Admitting: Interventional Cardiology

## 2020-08-19 VITALS — BP 142/86 | HR 67 | Ht <= 58 in | Wt 131.0 lb

## 2020-08-19 DIAGNOSIS — E039 Hypothyroidism, unspecified: Secondary | ICD-10-CM

## 2020-08-19 DIAGNOSIS — R072 Precordial pain: Secondary | ICD-10-CM | POA: Diagnosis not present

## 2020-08-19 DIAGNOSIS — E782 Mixed hyperlipidemia: Secondary | ICD-10-CM | POA: Diagnosis not present

## 2020-08-19 MED ORDER — METOPROLOL TARTRATE 25 MG PO TABS: ORAL_TABLET | ORAL | 0 refills | Status: DC

## 2020-08-19 NOTE — Progress Notes (Signed)
Cardiology Office Note   Date:  08/19/2020   ID:  Julie [name], DOB 08-25-1941, MRN [id number]  PCP:  Eulas Post, MD    No chief complaint on file.  Chest pain  Wt Readings from Last 3 Encounters:  08/19/20 131 lb (59.4 kg)  01/16/19 134 lb 8 oz (61 kg)  12/20/18 134 lb 11.2 oz (61.1 kg)       History of Present Illness: Julie [name] is a 79 y.o. female who is being seen today for the evaluation of back and neck pain at the request of Eulas Post, MD.  Episode in 8/21 with following phone note from PMD office: "The caller states that she had severe pain in her back and in her neck. She is also having shortness of breath. Chest feels heavy. Vein in her neck look swollen. Advice given to patient: Care Advice Given Per Guideline CALL EMS 911 NOW:"  She did not go to the ER at that time.  In 8/21, she felt that pain as described above which lasted for an hour.  She did not have any obvious neuro signs.    Denies : exertional Chest pain. Dizziness. Leg edema. Nitroglycerin use. Orthopnea. Palpitations. Paroxysmal nocturnal dyspnea. Syncope.   Has some DOE with longer walks.  Family h/o CAD, father was in his 28s.  Sister without any heart problems.  Two brothers who died as children.   Walking is most strenuous exercise.  She gets easily fatigued.    She had COVID in 2020.  Felt fatigue.  No problems with her vaccines.     Past Medical History:  Diagnosis Date  . Arthritis    lower back shoulder  . Arthropathy, unspecified, site unspecified   . Cancer ([place])    skin -spots on face removed  . Contact dermatitis and other eczema, due to unspecified cause    History  . Depression   . GERD (gastroesophageal reflux disease)    history - no current prob - no med  . Hypertension    was taking lisinopril but had an allergic reaction to medicaion  . 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 Other  bursitis disorders    history  . Personal history of colonic polyps   . PONV (postoperative nausea and vomiting)   . Pre-diabetes   . Pure hypercholesterolemia   . Spondylolisthesis, cervical region   . SVD (spontaneous vaginal delivery)    x 1  . Unspecified disorder of thyroid   . Unspecified hypothyroidism     Past Surgical History:  Procedure Laterality Date  . ABDOMINAL HYSTERECTOMY    . ANTERIOR CERVICAL DECOMP/DISCECTOMY FUSION N/A 11/11/2017   Procedure: Cervical seven - Thoracic one  Anterior cervical decompression/discectomy/fusion;  Surgeon: Erline Levine, MD;  Location: [facility];  Service: Neurosurgery;  Laterality: N/A;  . ANTERIOR LAT LUMBAR FUSION Left 01/22/2017   Procedure: Left Lumbar three-four, Lumbar four-five  Anterior lateral lumbar interbody fusion;  Surgeon: Erline Levine, MD;  Location: [facility];  Service: Neurosurgery;  Laterality: Left;  . BACK SURGERY  2002   L4/L5  . BLADDER SUSPENSION N/A 03/07/2014   Procedure: TRANSVAGINAL TAPE (TVT) PROCEDURE WITH CYSTO;  Surgeon: Princess Bruins, MD;  Location: [facility];  Service: Gynecology;  Laterality: N/A;  vaginal   . blephorplasty      bilateral   . BREAST SURGERY     x 2 breast implants,  later removed  . COLONOSCOPY    . CYSTOCELE REPAIR N/A 03/07/2014   Procedure: ANTERIOR REPAIR (CYSTOCELE);  Surgeon: Princess Bruins, MD;  Location: [facility];  Service: Gynecology;  Laterality: N/A;  . devaited septum surgery  1975  . EYE SURGERY     bilateral cataract surgery  . KNEE ARTHROSCOPY  10/21/2012   right - Procedure: ARTHROSCOPY KNEE;  Surgeon: Mcarthur Rossetti, MD;  [facility];  Service: Orthopedics;  Laterality: Left;  Left Knee Arthroscopy, Debridement, partial synovectomy  . LUMBAR LAMINECTOMY    . LUMBAR PERCUTANEOUS PEDICLE SCREW 2 LEVEL N/A 01/22/2017   Procedure: Lumbar Percutaneous Pedicle Screw Placement Lumbar three-four, Lumbar four-five;  Surgeon: Erline Levine, MD;  Location: [facility];  Service:  Neurosurgery;  Laterality: N/A;  . ROBOTIC ASSISTED LAPAROSCOPIC SACROCOLPOPEXY N/A 03/07/2014   Procedure: ROBOTIC ASSISTED LAPAROSCOPIC SACROCOLPOPEXY With Carrolyn Meiers;  Surgeon: Princess Bruins, MD;  Location: [facility];  Service: Gynecology;  Laterality: N/A;  . ROBOTIC ASSISTED SUPRACERVICAL HYSTERECTOMY WITH BILATERAL SALPINGO OOPHERECTOMY N/A 03/07/2014   Procedure: ROBOTIC ASSISTED SUPRACERVICAL HYSTERECTOMY WITH BILATERAL SALPINGO OOPHORECTOMY;  Surgeon: Princess Bruins, MD;  Location: [facility];  Service: Gynecology;  Laterality: N/A;  4 hrs.  . TONSILLECTOMY    . TOTAL KNEE ARTHROPLASTY Left 12/30/2012   Procedure: TOTAL KNEE ARTHROPLASTY;  Surgeon: Mcarthur Rossetti, MD;  [facility];  Service: Orthopedics;  Laterality: Left;  Left Total Knee Arthroplasty  . TUBAL LIGATION    . WISDOM TOOTH EXTRACTION       Current Outpatient Medications  Medication Sig Dispense Refill  . albuterol (VENTOLIN HFA) 108 (90 Base) MCG/ACT inhaler INHALE 2 PUFFS INTO THE LUNGS EVERY 4 HOURS AS NEEDED FOR WHEEZING OR SHORTNESS OF BREATH 8.5 g 1  . Bilberry, Vaccinium myrtillus, (BILBERRY PO) Take 1,200 mg elemental calcium/kg/hr by mouth daily.    . Biotin 3 MG TABS Take 1 tablet by mouth daily.    . Calcium Carb-Cholecalciferol (CALCIUM 500 + D3 PO) Take 1 tablet by mouth daily.     . Cholecalciferol 5000 UNITS capsule Take 5,000 Units by mouth daily.    . colchicine (COLCRYS) 0.6 MG tablet Take 1 tablet (0.6 mg total) by mouth 2 (two) times daily as needed. (Patient taking differently: Take 0.6 mg by mouth as needed. ) 60 tablet 3  . Cyanocobalamin (VITAMIN B-12 PO) Take 5,000 mcg by mouth daily.    . Multiple Vitamins-Minerals (AIRBORNE) CHEW Chew 1 tablet by mouth daily.    Marland Kitchen thyroid (NP THYROID) 30 MG tablet TAKE 1 TABLET BY MOUTH EVERY DAY 90 tablet 3  . traZODone (DESYREL) 50 MG tablet TAKE 1/2 TO 1 TABLET(25 TO 50 MG) BY MOUTH AT BEDTIME AS NEEDED FOR SLEEP 30 tablet 5  . TURMERIC CURCUMIN PO  Take 1,000 mg by mouth daily.    . vitamin E 400 UNIT capsule Take 1 tablet by mouth daily.     No current facility-administered medications for this visit.    Allergies:   Penicillins, Adhesive [tape], Codeine, Lisinopril, Other, and Percocet [oxycodone-acetaminophen]    Social History:  The patient  reports that she has never smoked. She has never used smokeless tobacco. She reports that she does not drink alcohol and does not use drugs.   Family History:  The patient's family history includes Cancer in her mother; Diabetes in her sister; Emphysema in her maternal aunt; Heart attack in her father; Stroke in her father.    ROS:  Please see the history of present illness.  Otherwise, review of systems are positive for DOE.   All other systems are reviewed and negative.    PHYSICAL EXAM: VS:  BP (!) 142/86   Pulse 67   Ht 4\' 10"  (1.473 m)   Wt 131 lb (59.4 kg)   SpO2 97%   BMI 27.38 kg/m  , BMI Body mass index is 27.38 kg/m. GEN: Well nourished, well developed, in no acute distress  HEENT: normal  Neck: no JVD, carotid bruits, or masses Cardiac: RRR; no murmurs, rubs, or gallops,no edema  Respiratory:  clear to auscultation bilaterally, normal work of breathing GI: soft, nontender, nondistended, + BS MS: no deformity or atrophy  Skin: warm and dry, no rash Neuro:  Strength and sensation are intact Psych: euthymic mood, full affect   EKG:   The ekg ordered today demonstrates NSR, no ST changes   Recent Labs: 12/15/2019: TSH 2.18   Lipid Panel    Component Value Date/Time   CHOL 218 (H) 02/10/2016 1005   TRIG 111.0 02/10/2016 1005   TRIG 98 08/16/2006 1607   HDL 42.40 02/10/2016 1005   CHOLHDL 5 02/10/2016 1005   VLDL 22.2 02/10/2016 1005   LDLCALC 153 (H) 02/10/2016 1005   LDLDIRECT 158.1 10/30/2013 1557     Other studies Reviewed: Additional studies/ records that were reviewed today with results demonstrating: 2019 labs reviewed- PMD records  reviewed.   ASSESSMENT AND PLAN:  1. Precordial chest pain: DOE as well.  RF for CAD.  Plan for CTA coronaries.  Given age and HR 31, will give metoprolol 25 mg daily.  2. Hyperlipidemia: LDL 153 in 2017.  Repeat labs.  May need to consider statin based on calcium score.  3. Hypothyroidism: Taking Synthroid   Current medicines are reviewed at length with the patient today.  The patient concerns regarding her medicines were addressed.  The following changes have been made:  No change  Labs/ tests ordered today include:  No orders of the defined types were placed in this encounter.   Recommend 150 minutes/week of aerobic exercise Low fat, low carb, high fiber diet recommended  Disposition:   FU based on CT scan results   Signed, Larae Grooms, MD  08/19/2020 11:29 AM    [facility] [address] [place], [place]  [address] Phone: [phone]; Fax: [phone]

## 2020-08-19 NOTE — Patient Instructions (Addendum)
Medication Instructions:  Your physician recommends that you continue on your current medications as directed. Please refer to the Current Medication list given to you today.  *If you need a refill on your cardiac medications before your next appointment, please call your pharmacy*   Lab Work: Your physician recommends that you return for a FASTING lipid profile, CMET, and CBC prior to your Cardiac CT  If you have labs (blood work) drawn today and your tests are completely normal, you will receive your results only by: Marland Kitchen MyChart Message (if you have MyChart) OR . A paper copy in the mail If you have any lab test that is abnormal or we need to change your treatment, we will call you to review the results.   Testing/Procedures: Your physician has requested that you have cardiac CT.   Follow-Up: Based on test results   Other Instructions Your cardiac CT will be scheduled at one of the below locations:   [facility] [address] [place], [place] [address] [phone]  [facility] [address] [place], [place] [address] [phone]  If scheduled at [facility], please arrive at the [facility] main entrance of [facility] 30 minutes prior to test start time. Proceed to the [facility] Radiology Department (first floor) to check-in and test prep.  If scheduled at [facility], please arrive 15 mins early for check-in and test prep.  Please follow these instructions carefully (unless otherwise directed):   On the Night Before the Test: . Be sure to Drink plenty of water. . Do not consume any caffeinated/decaffeinated beverages or chocolate 12 hours prior to your test. . Do not take any antihistamines 12 hours prior to your test.   On the Day of the Test: . Drink plenty of water. Do not drink any water within one hour of the test. . Do not eat any food 4 hours  prior to the test. . You may take your regular medications prior to the test.  . Take metoprolol (Lopressor) 25 MG two hours prior to test. . FEMALES- please wear underwire-free bra if available     After the Test: . Drink plenty of water. . After receiving IV contrast, you may experience a mild flushed feeling. This is normal. . On occasion, you may experience a mild rash up to 24 hours after the test. This is not dangerous. If this occurs, you can take Benadryl 25 mg and increase your fluid intake. . If you experience trouble breathing, this can be serious. If it is severe call 911 IMMEDIATELY. If it is mild, please call our office.   Once we have confirmed authorization from your insurance company, we will call you to set up a date and time for your test. Based on how quickly your insurance processes prior authorizations requests, please allow up to 4 weeks to be contacted for scheduling your Cardiac CT appointment. Be advised that routine Cardiac CT appointments could be scheduled as many as 8 weeks after your provider has ordered it.  For non-scheduling related questions, please contact the cardiac imaging nurse navigator should you have any questions/concerns: Marchia Bond, Cardiac Imaging Nurse Navigator Burley Saver, Interim Cardiac Imaging Nurse [facility] Direct Office Dial: [phone]   For scheduling needs, including cancellations and rescheduling, please call Vivien Rota at [phone], option 3.

## 2020-08-20 ENCOUNTER — Other Ambulatory Visit: Payer: Self-pay | Admitting: Interventional Cardiology

## 2020-08-30 ENCOUNTER — Other Ambulatory Visit: Payer: PPO

## 2020-09-02 ENCOUNTER — Telehealth (HOSPITAL_COMMUNITY): Payer: Self-pay | Admitting: Emergency Medicine

## 2020-09-02 ENCOUNTER — Other Ambulatory Visit: Payer: PPO | Admitting: *Deleted

## 2020-09-02 ENCOUNTER — Other Ambulatory Visit: Payer: Self-pay

## 2020-09-02 DIAGNOSIS — R072 Precordial pain: Secondary | ICD-10-CM

## 2020-09-02 LAB — COMPREHENSIVE METABOLIC PANEL
ALT: 14 IU/L (ref 0–32)
AST: 17 IU/L (ref 0–40)
Albumin/Globulin Ratio: 2.1 (ref 1.2–2.2)
Albumin: 4.2 g/dL (ref 3.7–4.7)
Alkaline Phosphatase: 73 IU/L (ref 44–121)
BUN/Creatinine Ratio: 19 (ref 12–28)
BUN: 12 mg/dL (ref 8–27)
Bilirubin Total: 0.5 mg/dL (ref 0.0–1.2)
CO2: 24 mmol/L (ref 20–29)
Calcium: 9.5 mg/dL (ref 8.7–10.3)
Chloride: 105 mmol/L (ref 96–106)
Creatinine, Ser: 0.62 mg/dL (ref 0.57–1.00)
GFR calc Af Amer: 98 mL/min/{1.73_m2} (ref >59)
GFR calc non Af Amer: 85 mL/min/{1.73_m2} (ref >59)
Globulin, Total: 2 g/dL (ref 1.5–4.5)
Glucose: 104 mg/dL — ABNORMAL HIGH (ref 65–99)
Potassium: 3.6 mmol/L (ref 3.5–5.2)
Sodium: 143 mmol/L (ref 134–144)
Total Protein: 6.2 g/dL (ref 6.0–8.5)

## 2020-09-02 LAB — CBC
Hematocrit: 35.4 % (ref 34.0–46.6)
Hemoglobin: 12 g/dL (ref 11.1–15.9)
MCH: 30.6 pg (ref 26.6–33.0)
MCHC: 33.9 g/dL (ref 31.5–35.7)
MCV: 90 fL (ref 79–97)
Platelets: 261 10*3/uL (ref 150–450)
RBC: 3.92 x10E6/uL (ref 3.77–5.28)
RDW: 12.8 % (ref 11.7–15.4)
WBC: 7.8 10*3/uL (ref 3.4–10.8)

## 2020-09-02 LAB — LIPID PANEL
Chol/HDL Ratio: 4.2 ratio (ref 0.0–4.4)
Cholesterol, Total: 205 mg/dL — ABNORMAL HIGH (ref 100–199)
HDL: 49 mg/dL (ref >39)
LDL Chol Calc (NIH): 142 mg/dL — ABNORMAL HIGH (ref 0–99)
Triglycerides: 76 mg/dL (ref 0–149)
VLDL Cholesterol Cal: 14 mg/dL (ref 5–40)

## 2020-09-02 NOTE — Telephone Encounter (Signed)
Reaching out to patient to offer assistance regarding upcoming cardiac imaging study; pt verbalizes understanding of appt date/time, parking situation and where to check in, pre-test NPO status and medications ordered, and verified current allergies; name and call back number provided for further questions should they arise Marchia Bond RN Navigator Cardiac Imaging [facility] [phone] office [phone] cell  25mg  metoprolol tartrate 2 hr prior to scan. Labs collected-results pending

## 2020-09-03 ENCOUNTER — Ambulatory Visit (HOSPITAL_COMMUNITY)
Admission: RE | Admit: 2020-09-03 | Discharge: 2020-09-03 | Disposition: A | Discharge status: Home / Self Care | Payer: PPO | Source: Ambulatory Visit | Attending: Interventional Cardiology | Admitting: Interventional Cardiology

## 2020-09-03 DIAGNOSIS — R072 Precordial pain: Secondary | ICD-10-CM | POA: Insufficient documentation

## 2020-09-03 MED ORDER — IOHEXOL 350 MG/ML SOLN
80.0000 mL | Freq: Once | INTRAVENOUS | Status: AC | PRN
  Administered 2020-09-03: 80 mL via INTRAVENOUS

## 2020-09-03 MED ORDER — NITROGLYCERIN 0.4 MG SL SUBL
SUBLINGUAL_TABLET | SUBLINGUAL | Status: AC
  Filled 2020-09-03: qty 2

## 2020-09-03 MED ORDER — NITROGLYCERIN 0.4 MG SL SUBL
0.8000 mg | SUBLINGUAL_TABLET | Freq: Once | SUBLINGUAL | Status: AC
  Administered 2020-09-03: 0.8 mg via SUBLINGUAL

## 2020-09-03 NOTE — Progress Notes (Signed)
CT scan completed. Tolerated well. D/C home ambulatory, awake and alert. In no distress. 

## 2020-09-04 ENCOUNTER — Ambulatory Visit (HOSPITAL_COMMUNITY)
Admission: RE | Admit: 2020-09-04 | Discharge: 2020-09-04 | Disposition: A | Discharge status: Home / Self Care | Payer: PPO | Source: Ambulatory Visit | Attending: Interventional Cardiology | Admitting: Interventional Cardiology

## 2020-09-04 DIAGNOSIS — R072 Precordial pain: Secondary | ICD-10-CM | POA: Diagnosis not present

## 2020-09-05 ENCOUNTER — Telehealth: Payer: Self-pay

## 2020-09-05 DIAGNOSIS — E782 Mixed hyperlipidemia: Secondary | ICD-10-CM

## 2020-09-05 DIAGNOSIS — R079 Chest pain, unspecified: Secondary | ICD-10-CM

## 2020-09-05 MED ORDER — ROSUVASTATIN CALCIUM 20 MG PO TABS: 20.0000 mg | ORAL_TABLET | Freq: Every day | ORAL | 3 refills | Status: DC

## 2020-09-05 NOTE — Telephone Encounter (Signed)
Called and made patient aware of results and recommendations to start crestor 20 mg QD and have a stress test. Rx sent to preferred pharmacy. She will have LIPIDS and LFTs on 2/8. Reviewed instructions for stress test. She understands that she will be contacted to schedule.

## 2020-09-05 NOTE — Telephone Encounter (Signed)
-----   Message from Jettie Booze, MD sent at 09/05/2020  7:50 AM EDT ----- Elevated calcium score.  Start rosuvastatin 20 mg daily.  Plaque appears only moderate, but due to artifact, they could not do FFR   Stress test was suggested.  OK to order nuclear stress test to further eval for ischemia.

## 2020-09-05 NOTE — Telephone Encounter (Signed)
-----   Message from Jettie Booze, MD sent at 09/05/2020  7:47 AM EDT ----- Elevated LDL.  Borderline glucose.  Otherwise labs ok.

## 2020-09-18 ENCOUNTER — Encounter (HOSPITAL_COMMUNITY): Payer: Self-pay

## 2020-09-18 ENCOUNTER — Telehealth (HOSPITAL_COMMUNITY): Payer: Self-pay

## 2020-09-18 NOTE — Telephone Encounter (Signed)
My chart letter sent. S.Renne Platts EMTP

## 2020-09-24 ENCOUNTER — Ambulatory Visit (HOSPITAL_COMMUNITY): Payer: PPO | Attending: Cardiology

## 2020-09-24 ENCOUNTER — Other Ambulatory Visit: Payer: Self-pay

## 2020-09-24 DIAGNOSIS — R079 Chest pain, unspecified: Secondary | ICD-10-CM

## 2020-09-24 LAB — MYOCARDIAL PERFUSION IMAGING
LV dias vol: 59 mL (ref 46–106)
LV sys vol: 14 mL
Peak HR: 92 {beats}/min
Rest HR: 62 {beats}/min
SDS: 1
SRS: 0
SSS: 1
TID: 0.85

## 2020-09-24 MED ORDER — TECHNETIUM TC 99M TETROFOSMIN IV KIT
31.2000 | PACK | Freq: Once | INTRAVENOUS | Status: AC | PRN
  Administered 2020-09-24: 31.2 via INTRAVENOUS
  Filled 2020-09-24: qty 32

## 2020-09-24 MED ORDER — REGADENOSON 0.4 MG/5ML IV SOLN
0.4000 mg | Freq: Once | INTRAVENOUS | Status: AC
  Administered 2020-09-24: 0.4 mg via INTRAVENOUS

## 2020-09-24 MED ORDER — TECHNETIUM TC 99M TETROFOSMIN IV KIT
10.2000 | PACK | Freq: Once | INTRAVENOUS | Status: AC | PRN
  Administered 2020-09-24: 10.2 via INTRAVENOUS
  Filled 2020-09-24: qty 11

## 2020-10-07 ENCOUNTER — Telehealth: Payer: Self-pay | Admitting: Family Medicine

## 2020-10-07 NOTE — Progress Notes (Signed)
  Chronic Care Management   Note  10/07/2020 Name: Julie [name] MRN: [id number] DOB: 1941-05-15  Julie [name] is a 79 y.o. year old female who is a primary care patient of Burchette, Alinda Sierras, MD. I reached out to [employer] by phone today in response to a referral sent by [name]'s PCP, Burchette, Alinda Sierras, MD.   [name] was given information about Chronic Care Management services today including:  1. CCM service includes personalized support from designated clinical staff supervised by her physician, including individualized plan of care and coordination with other care providers 2. 24/7 contact phone numbers for assistance for urgent and routine care needs. 3. Service will only be billed when office clinical staff spend 20 minutes or more in a month to coordinate care. 4. Only one practitioner may furnish and bill the service in a calendar month. 5. The patient may stop CCM services at any time (effective at the end of the month) by phone call to the office staff.   Patient agreed to services and verbal consent obtained.   Follow up plan:   Carley Perdue [employer] Scheduler

## 2020-10-20 ENCOUNTER — Encounter: Payer: Self-pay | Admitting: Family Medicine

## 2020-10-24 DIAGNOSIS — R059 Cough, unspecified: Secondary | ICD-10-CM | POA: Diagnosis not present

## 2020-10-24 DIAGNOSIS — M545 Low back pain, unspecified: Secondary | ICD-10-CM | POA: Diagnosis not present

## 2020-10-28 DIAGNOSIS — E559 Vitamin D deficiency, unspecified: Secondary | ICD-10-CM | POA: Diagnosis not present

## 2020-10-28 DIAGNOSIS — R059 Cough, unspecified: Secondary | ICD-10-CM | POA: Diagnosis not present

## 2020-10-28 DIAGNOSIS — M545 Low back pain, unspecified: Secondary | ICD-10-CM | POA: Diagnosis not present

## 2020-11-15 ENCOUNTER — Telehealth: Payer: Self-pay | Admitting: Pharmacist

## 2020-11-15 NOTE — Chronic Care Management (AMB) (Signed)
I spoke with the patient about her upcoming appointment on 11/18/2020 @ 11:00 AM with the clinical pharmacist. She was asked to please have all medication on hand to review with the pharmacist. She was not able to answer the initial question at that time and she stated she would call back. I attempted at the end of the day and was unable to reach the patient.  Maia Breslow, [place] Assistant [phone]

## 2020-11-18 ENCOUNTER — Ambulatory Visit: Payer: PPO | Admitting: Pharmacist

## 2020-11-18 DIAGNOSIS — E039 Hypothyroidism, unspecified: Secondary | ICD-10-CM

## 2020-11-18 DIAGNOSIS — M810 Age-related osteoporosis without current pathological fracture: Secondary | ICD-10-CM

## 2020-11-18 NOTE — Chronic Care Management (AMB) (Signed)
Chronic Care Management Pharmacy  Name: Julie [name]  MRN: [id number] DOB: 08/07/1941  Initial Planning Appointment: n/a  Initial Questions: 1. Have you seen any other providers since your last visit? n/a 2. Any changes in your medicines or health? No   Chief Complaint/ HPI  Julie [name],  80 y.o. , female presents for their Initial CCM visit with the clinical pharmacist via telephone due to COVID-19 Pandemic.  PCP : Eulas Post, MD  Their chronic conditions include: HTN, HLD, osteoporosis, hypothyroidism, gout, insomnia  Office Visits: -12/13/19 Carolann Littler, MD: Patient presented for chronic conditions follow up.   Consult Visit: -08/19/20 Viviann Spare, MD (cardiology): Patient presented for evaluation of back and neck pain. BP elevated in office and LDL elevated.  -02/13/20 Marygrace Drought (ophthalmology): Patient presented for eye exam. Unable to access notes.  Medications: Outpatient Encounter Medications as of 11/18/2020  Medication Sig Note  . albuterol (VENTOLIN HFA) 108 (90 Base) MCG/ACT inhaler INHALE 2 PUFFS INTO THE LUNGS EVERY 4 HOURS AS NEEDED FOR WHEEZING OR SHORTNESS OF BREATH   . Bilberry, Vaccinium myrtillus, (BILBERRY PO) Take 1 tablet by mouth daily.   . Biotin 3 MG TABS Take 1 tablet by mouth daily.   . Calcium Carb-Cholecalciferol (CALCIUM 500 + D3 PO) Take 1 tablet by mouth daily.    . Cholecalciferol 5000 UNITS capsule Take 5,000 Units by mouth daily.   . colchicine (COLCRYS) 0.6 MG tablet Take 1 tablet (0.6 mg total) by mouth 2 (two) times daily as needed. (Patient taking differently: Take 0.6 mg by mouth as needed.) 06/14/2018: Takes prn  . Cyanocobalamin (VITAMIN B-12 PO) Take 5,000 mcg by mouth daily.   Marland Kitchen ELDERBERRY PO Take 1 tablet by mouth daily.   Marland Kitchen etodolac (LODINE) 400 MG tablet Take 400 mg by mouth as needed.   . gabapentin (NEURONTIN) 100 MG capsule Take 1 capsule by mouth as needed.   . Multiple Vitamins-Minerals  (AIRBORNE) CHEW Chew 1 tablet by mouth daily.   . traZODone (DESYREL) 50 MG tablet TAKE 1/2 TO 1 TABLET(25 TO 50 MG) BY MOUTH AT BEDTIME AS NEEDED FOR SLEEP   . TURMERIC CURCUMIN PO Take 1,000 mg by mouth daily.   . vitamin C (ASCORBIC ACID) 500 MG tablet Take 500 mg by mouth daily.   . vitamin E 400 UNIT capsule Take 1 tablet by mouth daily.   . Zinc 50 MG TABS Take by mouth.   . [DISCONTINUED] thyroid (NP THYROID) 30 MG tablet TAKE 1 TABLET BY MOUTH EVERY DAY   . rosuvastatin (CRESTOR) 20 MG tablet Take 1 tablet (20 mg total) by mouth daily. (Patient not taking: Reported on 11/18/2020)   . [DISCONTINUED] metoprolol tartrate (LOPRESSOR) 25 MG tablet Take 1 tablet by mouth 2 hours prior to Cardiac CT    No facility-administered encounter medications on file as of 11/18/2020.   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 She lives with her husband and her daughter lives in [place] and [place] dad lives nearby. She has not seen them often due to [place]. She owns one dog and 2 cats and 2 parrots and the dog is a Academic librarian (81 years old).   Patient reports that her and her husband share the cooking and cleaning. They have someone coming in every other week to help with the cleaning as well. They do eat lots of takeout and tend to eat more chicken and beef and does like veggies but does not  eat many. Both her and husband work and he is a full Restaurant manager, fast food  Patient sometimes takes Tourist information centre manager (dog) to the park but does not do much exercise.  Patient does not sleep well and has had problems falling and staying asleep.  She did note some benefit with trazodone when she started. She enjoys staying up later and recommended setting a sleep schedule and avoiding screens before bed. She has tried reading before bed and reports that her bedroom is dark and she plays the rain sound.  Patient reports she doesn't take many medicines consistently except for thyroid medication and  gabapentin.   Current Diagnosis/Assessment:  Goals Addressed            This Visit's Progress   . Pharmacy care plan       CARE PLAN ENTRY (see longitudinal plan of care for additional care plan information)  Current Barriers:  . Chronic Disease Management support, education, and care coordination needs related to Hyperlipidemia, Osteoporosis, and insomnia   Osteoporosis Last vitamin D Lab Results  Component Value Date   VD25OH 41 12/15/2019   . Pharmacist Clinical Goal(s): o Over the next 180 days, patient will work with PharmD and providers to prevent fractures and improve bone density . Current regimen:  . Calcium carbonate - vitamin D3 500 mg 1 tablet daily . Vitamin D 5000 units 1 capsule daily . Interventions: o Discussed recommendations for [phone] units of vitamin D daily and 1200 mg of calcium daily from dietary and supplemental sources. o Recommended weight-bearing and muscle strengthening exercises for building and maintaining bone density. . Patient self care activities - Over the next 180 days, patient will: o Continue current medications o Start weight bearing and muscle strengthening exercises  Hyperlipidemia Lab Results  Component Value Date/Time   LDLCALC 142 (H) 09/02/2020 08:56 AM   LDLDIRECT 158.1 10/30/2013 03:57 PM   . Pharmacist Clinical Goal(s): o Over the next 180 days, patient will work with PharmD and providers to achieve LDL goal < 100 . Current regimen:  o No medications . Interventions: o Discussed lowering cholesterol through diet by: Marland Kitchen Limiting foods with cholesterol such as liver and other organ meats, egg yolks, shrimp, and whole milk dairy products . Avoiding saturated fats and trans fats and incorporating healthier fats, such as lean meat, nuts, and unsaturated oils like canola and olive oils . Eating foods with soluble fiber such as whole-grain cereals such as oatmeal and oat bran, fruits such as apples, bananas, oranges, pears,  and prunes, legumes such as kidney beans, lentils, chick peas, black-eyed peas, and lima beans, and green leafy vegetables . Limiting alcohol intake o Discussed recommendations for moderate aerobic exercise for 150 minutes/week spread out over 5 days for heart healthy lifestyle . Patient self care activities - Over the next 180 days, patient will: o Discuss with cardiologist about stopping rosuvastatin on her own and consider alternative medications for cholesterol lowering  Insomnia . Pharmacist Clinical Goal(s): o Over the next 180 days, patient will work with PharmD and providers to improve quality and quantity of sleep . Current regimen:  o Trazodone 50 mg 1 tablet at bedtime . Interventions: o Discussed practicing good sleep hygiene by setting a sleep schedule and maintaining it, avoid excessive napping, following a nightly routine, avoiding screen time for 30-60 minutes before going to bed, and making the bedroom a cool, quiet and dark space . Patient self care activities - Over the next 180 days, patient will: o Continue current medication  Medication management . Pharmacist Clinical Goal(s): o Over the next 180 days, patient will work with PharmD and providers to maintain optimal medication adherence . Current pharmacy: [facility] . Interventions o Comprehensive medication review performed. o Continue current medication management strategy . Patient self care activities - Over the next 180 days, patient will: o Focus on medication adherence by using a weekly pill box o Take medications as prescribed o Report any questions or concerns to PharmD and/or provider(s)  Initial goal documentation       Hypertension   BP goal is:  <140/90  Office blood pressures are  BP Readings from Last 3 Encounters:  09/03/20 117/61  08/19/20 (!) 142/86  01/16/19 118/62   Patient checks BP at home never Patient home BP readings are ranging: n/a  Patient has failed these meds in the  past: lisinopril (allergy) Patient is currently controlled on the following medications:  . No medications  We discussed diet and exercise extensively  Plan  Continue control with diet and exercise     Hyperlipidemia   LDL goal < 100  Last lipids Lab Results  Component Value Date   CHOL 205 (H) 09/02/2020   HDL 49 09/02/2020   LDLCALC 142 (H) 09/02/2020   LDLDIRECT 158.1 10/30/2013   TRIG 76 09/02/2020   CHOLHDL 4.2 09/02/2020   Hepatic Function Latest Ref Rng & Units 09/02/2020 02/10/2016 01/22/2016  Total Protein 6.0 - 8.5 g/dL 6.2 6.8 7.6  Albumin 3.7 - 4.7 g/dL 4.2 4.1 4.4  AST 0 - 40 IU/L 17 15 18   ALT 0 - 32 IU/L 14 14 15   Alk Phosphatase 44 - 121 IU/L 73 65 66  Total Bilirubin 0.0 - 1.2 mg/dL 0.5 0.6 0.7  Bilirubin, Direct 0.0 - 0.3 mg/dL - 0.1 -     The ASCVD Risk score Mikey Bussing DC Jr., et al., 2013) failed to calculate for the following reasons:   The 2013 ASCVD risk score is only valid for ages 42 to 14   Patient has failed these meds in past: none Patient is currently uncontrolled on the following medications:  . Rosuvastatin 20 mg 1 tablet daily - not taking  We discussed:  diet and exercise extensively  -Lowering cholesterol through diet by: Marland Kitchen Limiting foods with cholesterol such as liver and other organ meats, egg yolks, shrimp, and whole milk dairy products . Avoiding saturated fats and trans fats and incorporating healthier fats, such as lean meat, nuts, and unsaturated oils like canola and olive oils . Eating foods with soluble fiber such as whole-grain cereals such as oatmeal and oat bran, fruits such as apples, bananas, oranges, pears, and prunes, legumes such as kidney beans, lentils, chick peas, black-eyed peas, and lima beans, and green leafy vegetables . Limiting alcohol intake -Patient worries about side effects with rosuvastatin and tried for a month but reported it bothered her stomach  Plan  Continue control with diet and  exercise  Osteoporosis   Last DEXA Scan: 03/05/2015  T-Score femoral neck: N/A  T-Score total hip: N/A  T-Score lumbar spine: N/A  T-Score forearm radius: -4.8  10-year probability of major osteoporotic fracture: 28.2%  10-year probability of hip fracture: 18%  Vit D, 25-Hydroxy  Date Value Ref Range Status  12/15/2019 41 30 - 100 ng/mL Final    Comment:    Vitamin D Status         25-OH Vitamin D: . Deficiency:                    <  20 ng/mL Insufficiency:             20 - 29 ng/mL Optimal:                 > or = 30 ng/mL . For 25-OH Vitamin D testing on patients on  D2-supplementation and patients for whom quantitation  of D2 and D3 fractions is required, the Quest[facility]ureD(TM) 25-OH VIT D, (D2,D3), LC/MS/MS is recommended: order  code [id number] (patients >10yr). See Note 1 . Note 1 . For additional information, please refer to  http://education.Quest[facility].com/faq/FAQ199  (This link is being provided for informational/ educational purposes only.)      Patient is a candidate for pharmacologic treatment due to T-Score -1.0 to -2.5 and 10-year risk of major osteoporotic fracture > 20% and T-Score -1.0 to -2.5 and 10-year risk of hip fracture > 3%  Patient has failed these meds in past: Fosamax (completed 5 years) Patient is currently controlled on the following medications:  . Calcium carbonate - vitamin D3 500 mg 1 tablet daily . Vitamin D 5000 units 1 capsule daily  We discussed:  Recommend [phone] units of vitamin D daily. Recommend 1200 mg of calcium daily from dietary and supplemental sources. Recommend weight-bearing and muscle strengthening exercises for building and maintaining bone density.  -Calcium containing foods: eats yogurt every day, cheese almost every day, and some beans  Plan Recommend repeat DEXA. Continue current medications  Hypothyroidism   Lab Results  Component Value Date/Time   TSH 2.18 12/15/2019 03:17 PM   TSH 1.92 07/18/2018 03:16 PM     Patient has failed these meds in past: Synthroid (hair loss) Patient is currently controlled on the following medications:  . NP Thyroid 30 mg 1 tablet daily  We discussed:  taking the medication consistently  Plan  Continue current medications    Gout    Lab Results  Component Value Date   LABURIC 4.6 03/19/2013   LABURIC 3.0 09/05/2012    Patient is currently controlled on:  . Colchicine 0.6 mg 1 tablet daily as needed  We discussed: Avoiding/limiting foods with high purine content: . wild game, such as veal, venison, and duck . red meat . some seafood, including tuna, sardines, anchovies, herring, mussels, codfish, scallops, trout, and haddock . organ meat, such as liver, kidneys, and thymus glands, which are known as sweetbreads  Avoiding/limiting foods to allow the body to process purines more effectively: . High-fat foods: Fat holds uric acid in the kidneys, so a person should avoid fried foods, full-fat dairy products, rich desserts, and other high-fat items. . Alcohol: Beer and whiskey are high in purines, but some research shows that all alcohol consumption can raise uric acid levels. Alcohol also causes dehydration, which hampers the body's ability to flush out uric acid. . Sweetened beverages: Fructose is an ingredient in many sweetened beverages, including fruit juices and sodas, and consuming too much puts a person at risk for gout. -Patient reports some symptoms in her feet.  Plan Recommend repeat UA and may need to consider restarting allopurinol.  Continue current medications  Insomnia   Patient has failed these meds in past: Ambien (sleep walking), melatonin Patient is currently uncontrolled on the following medications:  . Trazodone 50 mg 1 tablet at bedtime as needed  We discussed:  Practicing good sleep hygiene by setting a sleep schedule and maintaining it, avoid excessive napping, following a nightly routine, avoiding screen time for 30-60  minutes before going to bed, and making the bedroom a  cool, quiet and dark space  Plan Will discuss with PCP about possible dose increase for trazodone. Continue current medications  Miscellaneous/OTC   Patient is currently on the following medications:  . Bilberry daily . Biotin 3 mg 1 tablet daily . Vitamin B12 5000 mcg 1 tablet daily . Airborne chew daily . Turmeric 1000 mg daily . Vitamin E 400 unit 1 capsule daily . Albuterol inhaler PRN . Coenzyme Q10  daily . Elderberry daily . Vitamin C daily . Zinc daily   Plan  Continue current medications   Vaccines   Reviewed and discussed patient's vaccination history.    Immunization History  Administered Date(s) Administered  . Influenza-Unspecified 09/02/2018  . PFIZER(Purple Top)SARS-COV-2 Vaccination 12/07/2019, 01/02/2020  . Pneumococcal Conjugate-13 02/24/2016  . Pneumococcal Polysaccharide-23 02/11/2015  . Tdap 04/23/2017   Patient hasn't had her influenza vaccine this year.  Plan  Recommended patient receive shingles vaccine at pharmacy.   Medication Management   Patient's preferred pharmacy is:  [facility], [address] [place] [address] Phone: [phone] Fax: [phone]  [id number], [address] [place] [address] Phone: [phone] Fax: [facility], [address] AT [address] [place] [address] Phone: [phone] Fax: [phone]  Uses pill box? Yes - weekly pill box Pt endorses 100% compliance  We discussed: Current pharmacy is preferred with insurance plan and patient is satisfied with pharmacy services  Plan  Continue current medication management strategy   Follow up: 6 month phone visit  Jeni Salles, PharmD  [place] Pharmacist [place] [place] [phone]

## 2020-11-21 ENCOUNTER — Telehealth: Payer: Self-pay | Admitting: *Deleted

## 2020-11-21 DIAGNOSIS — E039 Hypothyroidism, unspecified: Secondary | ICD-10-CM

## 2020-11-21 DIAGNOSIS — I10 Essential (primary) hypertension: Secondary | ICD-10-CM

## 2020-11-21 DIAGNOSIS — R7309 Other abnormal glucose: Secondary | ICD-10-CM

## 2020-11-21 DIAGNOSIS — M19042 Primary osteoarthritis, left hand: Secondary | ICD-10-CM

## 2020-11-21 DIAGNOSIS — M810 Age-related osteoporosis without current pathological fracture: Secondary | ICD-10-CM

## 2020-11-21 DIAGNOSIS — E7800 Pure hypercholesterolemia, unspecified: Secondary | ICD-10-CM

## 2020-11-21 NOTE — Telephone Encounter (Signed)
-----   Message from Viona Gilmore, [facility] sent at 11/14/2020  7:50 AM EST ----- Regarding: CCM referral Hi!  Can you please place a CCM referral for [name], one of Dr. Erick Blinks patients?  Thank you, Maddie

## 2020-12-01 ENCOUNTER — Other Ambulatory Visit: Payer: Self-pay | Admitting: Family Medicine

## 2020-12-02 NOTE — Patient Instructions (Addendum)
Hi Julie [name],  It was lovely getting to speak with you over the phone! Below is a summary of some of the topics we discussed. I also attached some information that I hope will be helpful for trying to lower your cholesterol through diet and exercise. Try your best to stick to a sleeping schedule and avoid screen time before bed as I do think that could improve your sleep.  I would also strongly recommend getting your flu shot and shingles shot at the pharmacy as we discussed!  Please give me a call if you have any questions or need anything before our follow up!  Best, Maddie  Jeni Salles, PharmD [facility] Clinical Pharmacist [facility] [place]   Visit Information  Goals Addressed            This Visit's Progress   . Pharmacy care plan       CARE PLAN ENTRY (see longitudinal plan of care for additional care plan information)  Current Barriers:  . Chronic Disease Management support, education, and care coordination needs related to Hyperlipidemia, Osteoporosis, and insomnia   Osteoporosis Last vitamin D Lab Results  Component Value Date   VD25OH 41 12/15/2019   . Pharmacist Clinical Goal(s): o Over the next 180 days, patient will work with PharmD and providers to prevent fractures and improve bone density . Current regimen:  . Calcium carbonate - vitamin D3 500 mg 1 tablet daily . Vitamin D 5000 units 1 capsule daily . Interventions: o Discussed recommendations for [phone] units of vitamin D daily and 1200 mg of calcium daily from dietary and supplemental sources. o Recommended weight-bearing and muscle strengthening exercises for building and maintaining bone density. . Patient self care activities - Over the next 180 days, patient will: o Continue current medications o Start weight bearing and muscle strengthening exercises  Hyperlipidemia Lab Results  Component Value Date/Time   LDLCALC 142 (H) 09/02/2020 08:56 AM   LDLDIRECT 158.1  10/30/2013 03:57 PM   . Pharmacist Clinical Goal(s): o Over the next 180 days, patient will work with PharmD and providers to achieve LDL goal < 100 . Current regimen:  o No medications . Interventions: o Discussed lowering cholesterol through diet by: Marland Kitchen Limiting foods with cholesterol such as liver and other organ meats, egg yolks, shrimp, and whole milk dairy products . Avoiding saturated fats and trans fats and incorporating healthier fats, such as lean meat, nuts, and unsaturated oils like canola and olive oils . Eating foods with soluble fiber such as whole-grain cereals such as oatmeal and oat bran, fruits such as apples, bananas, oranges, pears, and prunes, legumes such as kidney beans, lentils, chick peas, black-eyed peas, and lima beans, and green leafy vegetables . Limiting alcohol intake o Discussed recommendations for moderate aerobic exercise for 150 minutes/week spread out over 5 days for heart healthy lifestyle . Patient self care activities - Over the next 180 days, patient will: o Discuss with cardiologist about stopping rosuvastatin on her own and consider alternative medications for cholesterol lowering  Insomnia . Pharmacist Clinical Goal(s): o Over the next 180 days, patient will work with PharmD and providers to improve quality and quantity of sleep . Current regimen:  o Trazodone 50 mg 1 tablet at bedtime . Interventions: o Discussed practicing good sleep hygiene by setting a sleep schedule and maintaining it, avoid excessive napping, following a nightly routine, avoiding screen time for 30-60 minutes before going to bed, and making the bedroom a cool, quiet and dark space .  Patient self care activities - Over the next 180 days, patient will: o Continue current medication  Medication management . Pharmacist Clinical Goal(s): o Over the next 180 days, patient will work with PharmD and providers to maintain optimal medication adherence . Current pharmacy:  [facility] . Interventions o Comprehensive medication review performed. o Continue current medication management strategy . Patient self care activities - Over the next 180 days, patient will: o Focus on medication adherence by using a weekly pill box o Take medications as prescribed o Report any questions or concerns to PharmD and/or provider(s)  Initial goal documentation        Julie [name] was given information about Chronic Care Management services today including:  1. CCM service includes personalized support from designated clinical staff supervised by her physician, including individualized plan of care and coordination with other care providers 2. 24/7 contact phone numbers for assistance for urgent and routine care needs. 3. Standard insurance, coinsurance, copays and deductibles apply for chronic care management only during months in which we provide at least 20 minutes of these services. Most insurances cover these services at 100%, however patients may be responsible for any copay, coinsurance and/or deductible if applicable. This service may help you avoid the need for more expensive face-to-face services. 4. Only one practitioner may furnish and bill the service in a calendar month. 5. The patient may stop CCM services at any time (effective at the end of the month) by phone call to the office staff.  Patient agreed to services and verbal consent obtained.   The patient verbalized understanding of instructions, educational materials, and care plan provided today and agreed to receive a mailed copy of patient instructions, educational materials, and care plan.  Telephone follow up appointment with pharmacy team member scheduled for: 6 months  Viona Gilmore, [facility]  High Cholesterol  High cholesterol is a condition in which the blood has high levels of a white, waxy substance similar to fat (cholesterol). The liver makes all the cholesterol that the body needs. The human  body needs small amounts of cholesterol to help build cells. A person gets extra or excess cholesterol from the food that he or she eats. The blood carries cholesterol from the liver to the rest of the body. If you have high cholesterol, deposits (plaques) may build up on the walls of your arteries. Arteries are the blood vessels that carry blood away from your heart. These plaques make the arteries narrow and stiff. Cholesterol plaques increase your risk for heart attack and stroke. Work with your health care provider to keep your cholesterol levels in a healthy range. What increases the risk? The following factors may make you more likely to develop this condition:  Eating foods that are high in animal fat (saturated fat) or cholesterol.  Being overweight.  Not getting enough exercise.  A family history of high cholesterol (familial hypercholesterolemia).  Use of tobacco products.  Having diabetes. What are the signs or symptoms? There are no symptoms of this condition. How is this diagnosed? This condition may be diagnosed based on the results of a blood test.  If you are older than 82 years of age, your health care provider may check your cholesterol levels every 4-6 years.  You may be checked more often if you have high cholesterol or other risk factors for heart disease. The blood test for cholesterol measures:  "Bad" cholesterol, or LDL cholesterol. This is the main type of cholesterol that causes heart disease. The  desired level is less than 100 mg/dL.  "Good" cholesterol, or HDL cholesterol. HDL helps protect against heart disease by cleaning the arteries and carrying the LDL to the liver for processing. The desired level for HDL is 60 mg/dL or higher.  Triglycerides. These are fats that your body can store or burn for energy. The desired level is less than 150 mg/dL.  Total cholesterol. This measures the total amount of cholesterol in your blood and includes LDL, HDL, and  triglycerides. The desired level is less than 200 mg/dL. How is this treated? This condition may be treated with:  Diet changes. You may be asked to eat foods that have more fiber and less saturated fats or added sugar.  Lifestyle changes. These may include regular exercise, maintaining a healthy weight, and quitting use of tobacco products.  Medicines. These are given when diet and lifestyle changes have not worked. You may be prescribed a statin medicine to help lower your cholesterol levels. Follow these instructions at home: Eating and drinking  Eat a healthy, balanced diet. This diet includes: ? Daily servings of a variety of fresh, frozen, or canned fruits and vegetables. ? Daily servings of whole grain foods that are rich in fiber. ? Foods that are low in saturated fats and trans fats. These include poultry and fish without skin, lean cuts of meat, and low-fat dairy products. ? A variety of fish, especially oily fish that contain omega-3 fatty acids. Aim to eat fish at least 2 times a week.  Avoid foods and drinks that have added sugar.  Use healthy cooking methods, such as roasting, grilling, broiling, baking, poaching, steaming, and stir-frying. Do not fry your food except for stir-frying.   Lifestyle  Get regular exercise. Aim to exercise for a total of 150 minutes a week. Increase your activity level by doing activities such as gardening, walking, and taking the stairs.  Do not use any products that contain nicotine or tobacco, such as cigarettes, e-cigarettes, and chewing tobacco. If you need help quitting, ask your health care provider.   General instructions  Take over-the-counter and prescription medicines only as told by your health care provider.  Keep all follow-up visits as told by your health care provider. This is important. Where to find more information  American Heart Association: www.heart.org  National Heart, Lung, and Blood Institute:  https://wilson-eaton.com/ Contact a health care provider if:  You have trouble achieving or maintaining a healthy diet or weight.  You are starting an exercise program.  You are unable to stop smoking. Get help right away if:  You have chest pain.  You have trouble breathing.  You have any symptoms of a stroke. "BE FAST" is an easy way to remember the main warning signs of a stroke: ? B - Balance. Signs are dizziness, sudden trouble walking, or loss of balance. ? E - Eyes. Signs are trouble seeing or a sudden change in vision. ? F - Face. Signs are sudden weakness or numbness of the face, or the face or eyelid drooping on one side. ? A - Arms. Signs are weakness or numbness in an arm. This happens suddenly and usually on one side of the body. ? S - Speech. Signs are sudden trouble speaking, slurred speech, or trouble understanding what people say. ? T - Time. Time to call emergency services. Write down what time symptoms started.  You have other signs of a stroke, such as: ? A sudden, severe headache with no known cause. ? Nausea  or vomiting. ? Seizure. These symptoms may represent a serious problem that is an emergency. Do not wait to see if the symptoms will go away. Get medical help right away. Call your local emergency services (911 in the [place] Do not drive yourself to the hospital. Summary  Cholesterol plaques increase your risk for heart attack and stroke. Work with your health care provider to keep your cholesterol levels in a healthy range.  Eat a healthy, balanced diet, get regular exercise, and maintain a healthy weight.  Do not use any products that contain nicotine or tobacco, such as cigarettes, e-cigarettes, and chewing tobacco.  Get help right away if you have any symptoms of a stroke. This information is not intended to replace advice given to you by your health care provider. Make sure you discuss any questions you have with your health care provider. Document Revised:  09/18/2019 Document Reviewed: 09/18/2019 Elsevier Patient Education  2021 [employer].

## 2020-12-10 ENCOUNTER — Other Ambulatory Visit: Payer: PPO

## 2020-12-24 ENCOUNTER — Telehealth: Payer: Self-pay | Admitting: Family Medicine

## 2020-12-24 NOTE — Telephone Encounter (Signed)
Left message for patient to call back and schedule Medicare Annual Wellness Visit (AWV) either virtually or in office. No detailed message left   Last AWV 06/14/18  please schedule at anytime with [facility] Nurse Health Advisor 1 or 2   This should be a 45 minute visit.

## 2020-12-28 IMAGING — CT CT HEART MORP W/ CTA COR W/ SCORE W/ CA W/CM &/OR W/O CM
2 of 10 series · 8 of 20 positions shown, 10 images · IV contrast (APPLIED)
Comparison: None.
COMPARISON: None.

Addendum:
EXAM:
OVER-READ INTERPRETATION  CT CHEST

The following report is an over-read performed by radiologist Dr.
Norris Terrazas [REDACTED] on 09/03/2020. This
over-read does not include interpretation of cardiac or coronary
anatomy or pathology. The coronary calcium score/coronary CTA
interpretation by the cardiologist is attached.
CLINICAL DATA: 78F with chest pain
Cardiac/Coronary CTA
TECHNIQUE: The patient was scanned on a Phillips Force scanner.

[Series 19: best syst · axial · 0.39mm/px · z∈[+960,+1046]mm · 6 of 3311 slices shown, 8 images]
[im 473/3311  vessel]
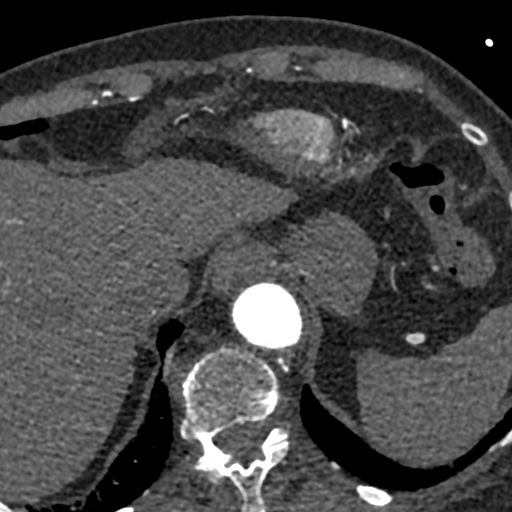
[im 473/3311  lung]
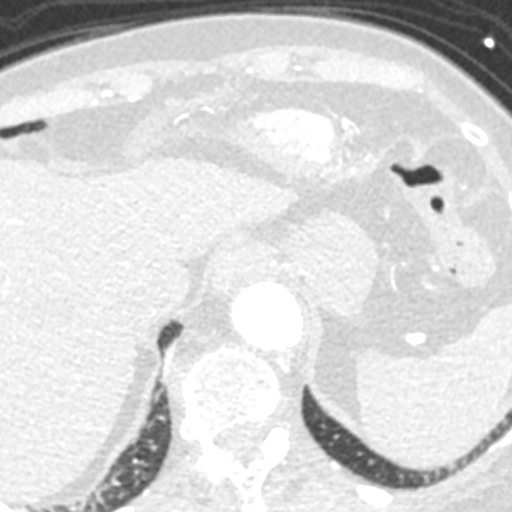
[im 946/3311  vessel]
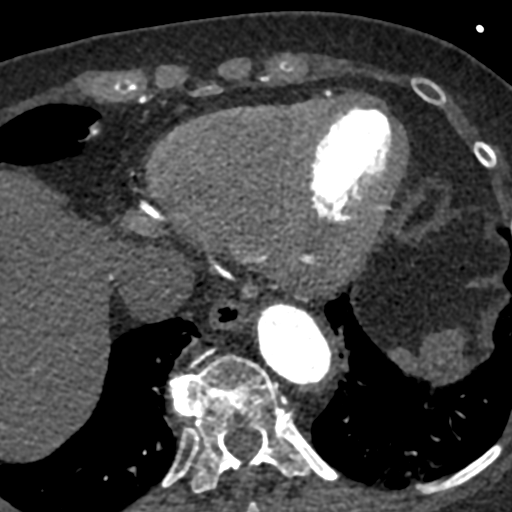
[im 1419/3311  vessel]
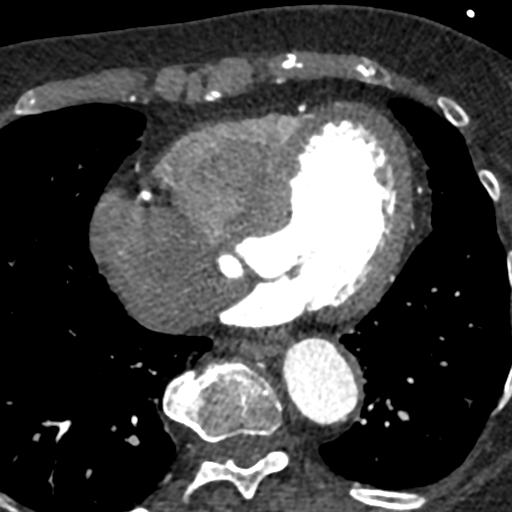
[im 1892/3311  vessel]
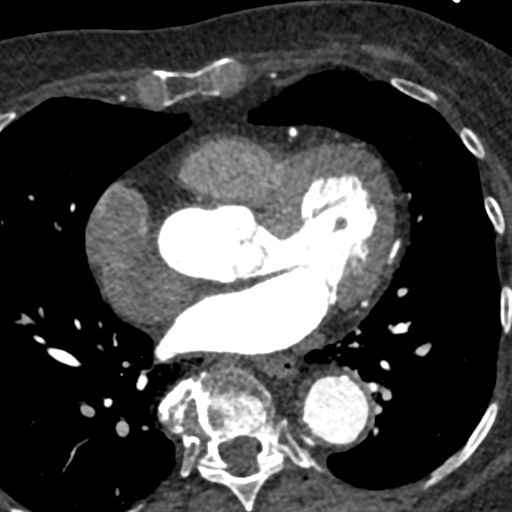
[im 2365/3311  vessel]
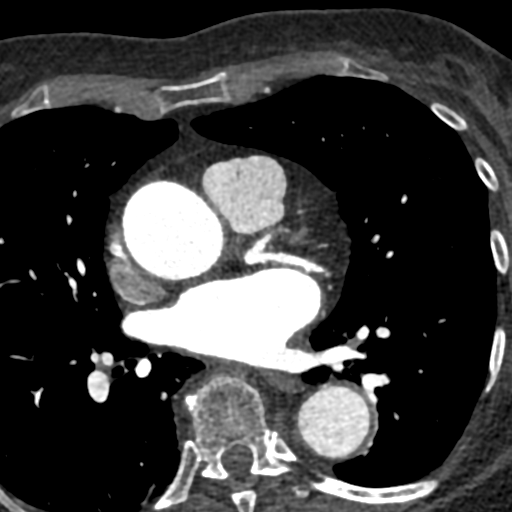
[im 2365/3311  lung]
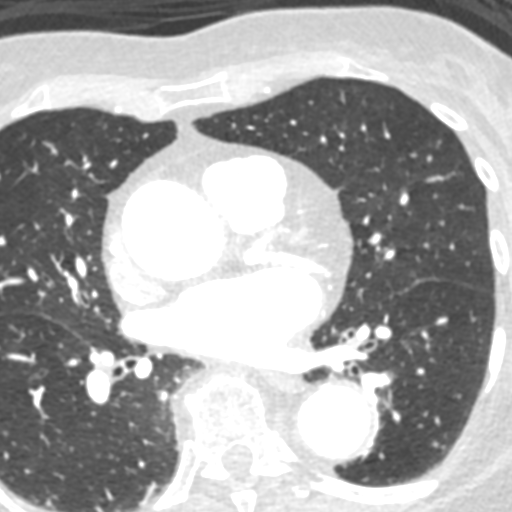
[im 2838/3311  vessel]
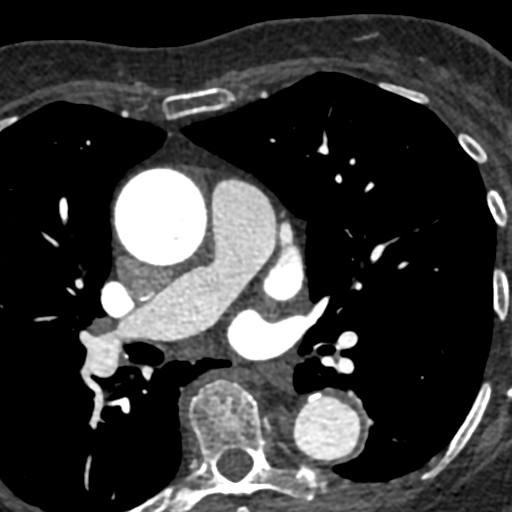

[Series 22: (id) · axial · 0.39mm/px · z∈[+990,+1026]mm · 2 of 1656 slices shown]
[im 552/1656  vessel]
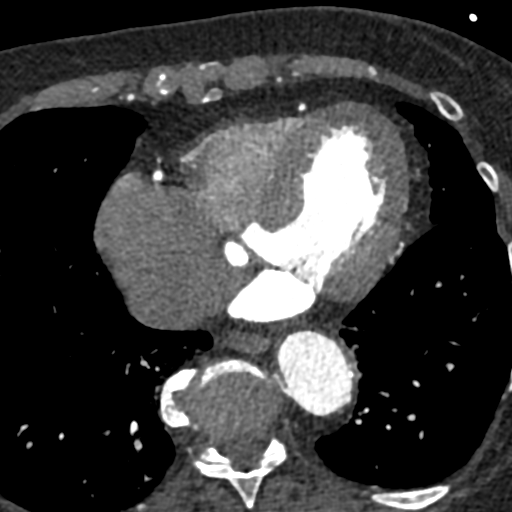
[im 1104/1656  vessel]
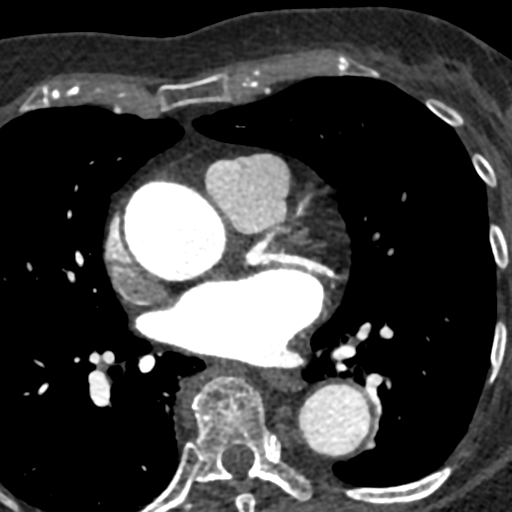

[8 of 20 positions shown; findings below may reference images not displayed]

FINDINGS: Aortic atherosclerosis. Within the visualized portions of the thorax
there are no suspicious appearing pulmonary nodules or masses, there
is no acute consolidative airspace disease, no pleural effusions, no
pneumothorax and no lymphadenopathy. Visualized portions of the
upper abdomen are unremarkable. There are no aggressive appearing
lytic or blastic lesions noted in the visualized portions of the
skeleton.
IMPRESSION: 1.  Aortic Atherosclerosis (3JEW1-GOX.X).
FINDINGS: A 100 kV prospective scan was triggered in the descending thoracic
aorta at 111 HU's. Axial non-contrast 3 mm slices were carried out
through the heart. The data set was analyzed on a dedicated work
station and scored using the Agatson method. Gantry rotation speed
was 250 msecs and collimation was .6 mm. 0.8 mg of sl NTG was given.
The 3D data set was reconstructed in 5% intervals of the 35-75 % of
the R-R cycle. Phases were analyzed on a dedicated work station
using MPR, MIP and VRT modes. The patient received 80 cc of
contrast.

RCA is a large dominant artery that gives rise to PDA and PLA. There
is noncalcified plaque in the proximal RCA causing 50-69% stenosis.
Calcified plaque in the mid RCA causes 0-24% stenosis

Left main is a large artery that gives rise to LAD and LCX arteries.
Step artifact at ostium of left main.

LAD is a large vessel. There is mixed plaque in the proximal LAD
that appears to cause 50-69% stenosis. There is a step artifact at
this location. Step artifact in distal LAD.

LCX is a non-dominant artery that gives rise to one large OM1
branch. There is calcified plaque in the proximal to mid LCX causing
0-24% stenosis. There is step artifact in the mid LCX

Other findings:

Left Ventricle: Normal size

Left Atrium: Normal size

Pulmonary Veins: Normal configuration

Right Ventricle: Normal size

Right Atrium: Normal size

Thoracic aorta: Dilated ascending aorta measuring up to 37mm

Pulmonary Arteries: Normal size

Systemic Veins: Normal drainage

Pericardium: Normal thickness
IMPRESSION: 1. Poor quality study due to step artifacts from respiratory motion
during acquisition. There is a significant step artifact at the left
main and proximal LAD that makes parts of these arteries
uninterpretable. Study unable to be processed for CT FFR due to step
artifacts

2. Coronary calcium score of 306. This was 71st percentile for age
and sex matched control.

3. Mixed plaque in the proximal LAD appears to cause moderate
(50-69%) stenosis, but difficult to assess given step artifact at
this location

4. Noncalcified plaque in the proximal RCA causes moderate (50-69%)
stenosis

5. In setting of significant step artifacts with at least moderate
CAD seen, would recommend further evaluation for ischemia, consider
nuclear stress test

CAD-RADS N Non-diagnostic study. Obstructive CAD can't be excluded.
Alternative evaluation is recommended.

*** End of Addendum ***
EXAM:
OVER-READ INTERPRETATION  CT CHEST

The following report is an over-read performed by radiologist Dr.
Norris Terrazas [REDACTED] on 09/03/2020. This
over-read does not include interpretation of cardiac or coronary
anatomy or pathology. The coronary calcium score/coronary CTA
interpretation by the cardiologist is attached.
FINDINGS: Aortic atherosclerosis. Within the visualized portions of the thorax
there are no suspicious appearing pulmonary nodules or masses, there
is no acute consolidative airspace disease, no pleural effusions, no
pneumothorax and no lymphadenopathy. Visualized portions of the
upper abdomen are unremarkable. There are no aggressive appearing
lytic or blastic lesions noted in the visualized portions of the
skeleton.
IMPRESSION: 1.  Aortic Atherosclerosis (3JEW1-GOX.X).

## 2021-02-25 DIAGNOSIS — H26491 Other secondary cataract, right eye: Secondary | ICD-10-CM | POA: Diagnosis not present

## 2021-02-25 DIAGNOSIS — H35372 Puckering of macula, left eye: Secondary | ICD-10-CM | POA: Diagnosis not present

## 2021-02-25 DIAGNOSIS — H43813 Vitreous degeneration, bilateral: Secondary | ICD-10-CM | POA: Diagnosis not present

## 2021-02-25 DIAGNOSIS — H524 Presbyopia: Secondary | ICD-10-CM | POA: Diagnosis not present

## 2021-02-25 LAB — HM DIABETES EYE EXAM

## 2021-03-06 ENCOUNTER — Encounter: Payer: Self-pay | Admitting: Family Medicine

## 2021-03-08 DIAGNOSIS — M25512 Pain in left shoulder: Secondary | ICD-10-CM | POA: Diagnosis not present

## 2021-03-18 DIAGNOSIS — H26491 Other secondary cataract, right eye: Secondary | ICD-10-CM | POA: Diagnosis not present

## 2021-03-25 ENCOUNTER — Encounter: Payer: Self-pay | Admitting: Family Medicine

## 2021-03-25 DIAGNOSIS — Z1231 Encounter for screening mammogram for malignant neoplasm of breast: Secondary | ICD-10-CM | POA: Diagnosis not present

## 2021-03-25 DIAGNOSIS — R102 Pelvic and perineal pain: Secondary | ICD-10-CM | POA: Diagnosis not present

## 2021-03-25 DIAGNOSIS — Z6826 Body mass index (BMI) 26.0-26.9, adult: Secondary | ICD-10-CM | POA: Diagnosis not present

## 2021-03-26 ENCOUNTER — Ambulatory Visit (INDEPENDENT_AMBULATORY_CARE_PROVIDER_SITE_OTHER): Payer: PPO | Admitting: Family Medicine

## 2021-03-26 ENCOUNTER — Encounter: Payer: Self-pay | Admitting: Family Medicine

## 2021-03-26 ENCOUNTER — Other Ambulatory Visit: Payer: Self-pay

## 2021-03-26 VITALS — BP 132/70 | HR 81 | Temp 98.7°F | Wt 133.7 lb

## 2021-03-26 DIAGNOSIS — R195 Other fecal abnormalities: Secondary | ICD-10-CM | POA: Diagnosis not present

## 2021-03-26 DIAGNOSIS — R1030 Lower abdominal pain, unspecified: Secondary | ICD-10-CM

## 2021-03-26 MED ORDER — CIPROFLOXACIN HCL 500 MG PO TABS: 500.0000 mg | ORAL_TABLET | Freq: Two times a day (BID) | ORAL | 0 refills | Status: DC

## 2021-03-26 NOTE — Patient Instructions (Signed)
Stay well hydrated  Start the Cipro 500 mg twice daily  IF you develop fever or increasing diarrhea let me know and would get stool culture and CBC

## 2021-03-26 NOTE — Telephone Encounter (Signed)
Patient seen in office and her problem/concern was addressed.

## 2021-03-26 NOTE — Progress Notes (Signed)
Established Patient Office Visit  Subjective:  Patient ID: Julie [name], female    DOB: 12-04-1940  Age: 80 y.o. MRN: [id number]  CC:  Chief Complaint  Patient presents with  . Abdominal Pain    Abdominal and pelvic pain, saw OBGYN and they did not find anything, pt states she ate the recalled jiffy peanut butter over the weekend    HPI Julie [name] presents for lower abdominal cramping over the past few days.  She specifically is concerned about possibility of Salmonella gastroenteritis.  There is a recall of Jiffy peanut butter over the weekend and she apparently did possess 1 of those lot numbers.  She recalls having a peanut butter sandwich Friday evening.  By Saturday she had some intermittent severe lower abdominal cramps.  She has had some loose stools but no frank diarrhea.  About 2 loose stools per day.  No bloody stools.  No fevers or chills.  No nausea or vomiting.  Has had some decline in appetite.  She has had some lower pelvic discomfort.  No dysuria.  Saw gynecology yesterday.  Pelvic exam unremarkable.  Urinalysis reportedly normal.  She had called yesterday requesting "treatment ".  Past Medical History:  Diagnosis Date  . Arthritis    lower back shoulder  . Arthropathy, unspecified, site unspecified   . Cancer ([place])    skin -spots on face removed  . Contact dermatitis and other eczema, due to unspecified cause    History  . Depression   . GERD (gastroesophageal reflux disease)    history - no current prob - no med  . Hypertension    was taking lisinopril but had an allergic reaction to medicaion  . 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 Other bursitis disorders    history  . Personal history of colonic polyps   . PONV (postoperative nausea and vomiting)   . Pre-diabetes   . Pure hypercholesterolemia   . Spondylolisthesis, cervical region   . SVD (spontaneous vaginal delivery)    x 1  . Unspecified  disorder of thyroid   . Unspecified hypothyroidism     Past Surgical History:  Procedure Laterality Date  . ABDOMINAL HYSTERECTOMY    . ANTERIOR CERVICAL DECOMP/DISCECTOMY FUSION N/A 11/11/2017   Procedure: Cervical seven - Thoracic one  Anterior cervical decompression/discectomy/fusion;  Surgeon: Erline Levine, MD;  Location: [facility];  Service: Neurosurgery;  Laterality: N/A;  . ANTERIOR LAT LUMBAR FUSION Left 01/22/2017   Procedure: Left Lumbar three-four, Lumbar four-five  Anterior lateral lumbar interbody fusion;  Surgeon: Erline Levine, MD;  Location: [facility];  Service: Neurosurgery;  Laterality: Left;  . BACK SURGERY  2002   L4/L5  . BLADDER SUSPENSION N/A 03/07/2014   Procedure: TRANSVAGINAL TAPE (TVT) PROCEDURE WITH CYSTO;  Surgeon: Princess Bruins, MD;  Location: [facility];  Service: Gynecology;  Laterality: N/A;  vaginal   . blephorplasty      bilateral   . BREAST SURGERY     x 2 breast implants, later removed  . COLONOSCOPY    . CYSTOCELE REPAIR N/A 03/07/2014   Procedure: ANTERIOR REPAIR (CYSTOCELE);  Surgeon: Princess Bruins, MD;  Location: [facility];  Service: Gynecology;  Laterality: N/A;  . devaited septum surgery  1975  . EYE SURGERY     bilateral cataract surgery  . KNEE ARTHROSCOPY  10/21/2012   right - Procedure: ARTHROSCOPY KNEE;  Surgeon: Mcarthur Rossetti, MD;  Location:  [facility];  Service: Orthopedics;  Laterality: Left;  Left Knee Arthroscopy, Debridement, partial synovectomy  . LUMBAR LAMINECTOMY    . LUMBAR PERCUTANEOUS PEDICLE SCREW 2 LEVEL N/A 01/22/2017   Procedure: Lumbar Percutaneous Pedicle Screw Placement Lumbar three-four, Lumbar four-five;  Surgeon: Erline Levine, MD;  Location: [facility];  Service: Neurosurgery;  Laterality: N/A;  . ROBOTIC ASSISTED LAPAROSCOPIC SACROCOLPOPEXY N/A 03/07/2014   Procedure: ROBOTIC ASSISTED LAPAROSCOPIC SACROCOLPOPEXY With Carrolyn Meiers;  Surgeon: Princess Bruins, MD;  Location: [facility];  Service: Gynecology;  Laterality: N/A;  . ROBOTIC  ASSISTED SUPRACERVICAL HYSTERECTOMY WITH BILATERAL SALPINGO OOPHERECTOMY N/A 03/07/2014   Procedure: ROBOTIC ASSISTED SUPRACERVICAL HYSTERECTOMY WITH BILATERAL SALPINGO OOPHORECTOMY;  Surgeon: Princess Bruins, MD;  Location: [facility];  Service: Gynecology;  Laterality: N/A;  4 hrs.  . TONSILLECTOMY    . TOTAL KNEE ARTHROPLASTY Left 12/30/2012   Procedure: TOTAL KNEE ARTHROPLASTY;  Surgeon: Mcarthur Rossetti, MD;  Location: [facility];  Service: Orthopedics;  Laterality: Left;  Left Total Knee Arthroplasty  . TUBAL LIGATION    . WISDOM TOOTH EXTRACTION      Family History  Problem Relation Age of Onset  . Cancer Mother        breast and lung  . Stroke Father   . Heart attack Father   . Diabetes Sister   . Emphysema Maternal Aunt   . Colon cancer Neg Hx     Social History   Socioeconomic History  . Marital status: Married    Spouse name: Not on file  . Number of children: Not on file  . Years of education: Not on file  . Highest education level: Not on file  Occupational History  . Not on file  Tobacco Use  . Smoking status: Never Smoker  . Smokeless tobacco: Never Used  Vaping Use  . Vaping Use: Never used  Substance and Sexual Activity  . Alcohol use: No  . Drug use: No  . Sexual activity: Yes    Birth control/protection: Post-menopausal  Other Topics Concern  . Not on file  Social History Narrative  . Not on file   Social Determinants of Health   Financial Resource Strain: Low Risk   . Difficulty of Paying Living Expenses: Not hard at all  Food Insecurity: Not on file  Transportation Needs: No Transportation Needs  . Lack of Transportation (Medical): No  . Lack of Transportation (Non-Medical): No  Physical Activity: Not on file  Stress: Not on file  Social Connections: Not on file  Intimate Partner Violence: Not on file    Outpatient Medications Prior to Visit  Medication Sig Dispense Refill  . albuterol (VENTOLIN HFA) 108 (90 Base) MCG/ACT inhaler INHALE 2  PUFFS INTO THE LUNGS EVERY 4 HOURS AS NEEDED FOR WHEEZING OR SHORTNESS OF BREATH 8.5 g 1  . Bilberry, Vaccinium myrtillus, (BILBERRY PO) Take 1 tablet by mouth daily.    . Biotin 3 MG TABS Take 1 tablet by mouth daily.    . Calcium Carb-Cholecalciferol (CALCIUM 500 + D3 PO) Take 1 tablet by mouth daily.     . Cholecalciferol 5000 UNITS capsule Take 5,000 Units by mouth daily.    . colchicine (COLCRYS) 0.6 MG tablet Take 1 tablet (0.6 mg total) by mouth 2 (two) times daily as needed. (Patient taking differently: Take 0.6 mg by mouth as needed.) 60 tablet 3  . Cyanocobalamin (VITAMIN B-12 PO) Take 5,000 mcg by mouth daily.    Marland Kitchen ELDERBERRY PO Take 1 tablet by mouth daily.    Marland Kitchen  etodolac (LODINE) 400 MG tablet Take 400 mg by mouth as needed.    . gabapentin (NEURONTIN) 100 MG capsule Take 1 capsule by mouth as needed.    . Multiple Vitamins-Minerals (AIRBORNE) CHEW Chew 1 tablet by mouth daily.    . rosuvastatin (CRESTOR) 20 MG tablet Take 1 tablet (20 mg total) by mouth daily. 90 tablet 3  . thyroid (NP THYROID) 30 MG tablet TAKE 1 TABLET BY MOUTH EVERY DAY 90 tablet 3  . traZODone (DESYREL) 50 MG tablet TAKE 1/2 TO 1 TABLET(25 TO 50 MG) BY MOUTH AT BEDTIME AS NEEDED FOR SLEEP 30 tablet 5  . TURMERIC CURCUMIN PO Take 1,000 mg by mouth daily.    . vitamin C (ASCORBIC ACID) 500 MG tablet Take 500 mg by mouth daily.    . vitamin E 400 UNIT capsule Take 1 tablet by mouth daily.    . Zinc 50 MG TABS Take by mouth.     No facility-administered medications prior to visit.    Allergies  Allergen Reactions  . Penicillins Anaphylaxis    REACTION: causes tongue to swell Has patient had a PCN reaction causing immediate rash, facial/tongue/throat swelling, SOB or lightheadedness with hypotension: Yes swelling Has patient had a PCN reaction causing severe rash involving mucus membranes or skin necrosis:  Has patient had a PCN reaction that required hospitalization no Has patient had a PCN reaction  occurring within the last 10 years: no If all of the above answers are "NO", then may proceed with Cephalosporin use.   . Adhesive [Tape] Other (See Comments)    Please use paper tape  . Codeine Nausea Only  . Lisinopril     REACTION: wheezing  . Other Other (See Comments)    Pt was having knee surgery and was given an epidural, which did not work.  Marland Kitchen Percocet [Oxycodone-Acetaminophen] Nausea And Vomiting    ROS Review of Systems  Constitutional: Positive for fatigue. Negative for chills and fever.  Gastrointestinal: Positive for abdominal pain. Negative for blood in stool, nausea and vomiting.  Genitourinary: Negative for dysuria.      Objective:    Physical Exam Vitals reviewed.  Constitutional:      Appearance: She is well-developed.  Cardiovascular:     Rate and Rhythm: Normal rate and regular rhythm.  Pulmonary:     Effort: Pulmonary effort is normal.     Breath sounds: Normal breath sounds.  Abdominal:     Comments: Normal bowel sounds.  Soft with minimal bilateral lower quadrant tenderness.  No guarding or rebound.  Neurological:     Mental Status: She is alert.     BP 132/70 (BP Location: Left Arm, Patient Position: Sitting, Cuff Size: Normal)   Pulse 81   Temp 98.7 F (37.1 C) (Oral)   Wt 133 lb 11.2 oz (60.6 kg)   SpO2 95%   BMI 33.46 kg/m  Wt Readings from Last 3 Encounters:  03/26/21 133 lb 11.2 oz (60.6 kg)  09/24/20 131 lb (59.4 kg)  08/19/20 131 lb (59.4 kg)     Health Maintenance Due  Topic Date Due  . FOOT EXAM  Never done  . URINE MICROALBUMIN  Never done  . COVID-19 Vaccine (3 - Booster for Pfizer series) 06/03/2020  . HEMOGLOBIN A1C  06/13/2020    There are no preventive care reminders to display for this patient.  Lab Results  Component Value Date   TSH 2.18 12/15/2019   Lab Results  Component Value Date  WBC 7.8 09/02/2020   HGB 12.0 09/02/2020   HCT 35.4 09/02/2020   MCV 90 09/02/2020   PLT 261 09/02/2020   Lab  Results  Component Value Date   NA 143 09/02/2020   K 3.6 09/02/2020   CO2 24 09/02/2020   GLUCOSE 104 (H) 09/02/2020   BUN 12 09/02/2020   CREATININE 0.62 09/02/2020   BILITOT 0.5 09/02/2020   ALKPHOS 73 09/02/2020   AST 17 09/02/2020   ALT 14 09/02/2020   PROT 6.2 09/02/2020   ALBUMIN 4.2 09/02/2020   CALCIUM 9.5 09/02/2020   ANIONGAP 9 03/14/2018   GFR 107.34 02/10/2016   Lab Results  Component Value Date   CHOL 205 (H) 09/02/2020   Lab Results  Component Value Date   HDL 49 09/02/2020   Lab Results  Component Value Date   LDLCALC 142 (H) 09/02/2020   Lab Results  Component Value Date   TRIG 76 09/02/2020   Lab Results  Component Value Date   CHOLHDL 4.2 09/02/2020   Lab Results  Component Value Date   HGBA1C 6.0 (H) 12/15/2019      Assessment & Plan:   Patient is seen with approximately 5-day history of lower abdominal pelvic cramping with possible exposure to peanut butter with Salmonella.  She does not report any severe symptoms such as fever, severe diarrhea, vomiting, etc.  She is nontoxic in appearance.  It is not totally clear that this does represent Salmonella enteritis but she did have what sounds like possible exposure.  -We explained that even with Salmonella gastroenteritis we frequently observed and that this is frequently self-limited.  On the other hand, she is 80 years old and at some increased risk because of that and with persistent symptoms we decided to go and treat with Cipro 500 mg twice daily for 5 days.  If symptoms not improving promptly next couple days consider further evaluation with CBC and stool culture.  Also consider GI referral if symptoms persist  Meds ordered this encounter  Medications  . ciprofloxacin (CIPRO) 500 MG tablet    Sig: Take 1 tablet (500 mg total) by mouth 2 (two) times daily.    Dispense:  10 tablet    Refill:  0    Follow-up: No follow-ups on file.    Carolann Littler, MD

## 2021-03-27 ENCOUNTER — Telehealth: Payer: Self-pay | Admitting: Pharmacist

## 2021-03-27 NOTE — Chronic Care Management (AMB) (Signed)
Chronic Care Management Pharmacy Assistant   Name: Julie [name]  MRN: [id number] DOB: Sep 25, 1941  Reason for Encounter: Disease State/ General Assessment Call.    Conditions to be addressed/monitored: HTN, HLD and Osteoporosis and Hypothyroidism.   Recent office visits:  03/26/21 Carolann Littler MD (PCP) - seen for lower abdominal pain and loose stools. Patient started on ciprofloxacin 500mg  twice daily. No follow up noted.  Recent consult visits:  None.   Hospital visits:  None in previous 6 months  Medications: Outpatient Encounter Medications as of 03/27/2021  Medication Sig Note  . albuterol (VENTOLIN HFA) 108 (90 Base) MCG/ACT inhaler INHALE 2 PUFFS INTO THE LUNGS EVERY 4 HOURS AS NEEDED FOR WHEEZING OR SHORTNESS OF BREATH   . Bilberry, Vaccinium myrtillus, (BILBERRY PO) Take 1 tablet by mouth daily.   . Biotin 3 MG TABS Take 1 tablet by mouth daily.   . Calcium Carb-Cholecalciferol (CALCIUM 500 + D3 PO) Take 1 tablet by mouth daily.    . Cholecalciferol 5000 UNITS capsule Take 5,000 Units by mouth daily.   . ciprofloxacin (CIPRO) 500 MG tablet Take 1 tablet (500 mg total) by mouth 2 (two) times daily.   . colchicine (COLCRYS) 0.6 MG tablet Take 1 tablet (0.6 mg total) by mouth 2 (two) times daily as needed. (Patient taking differently: Take 0.6 mg by mouth as needed.) 06/14/2018: Takes prn  . Cyanocobalamin (VITAMIN B-12 PO) Take 5,000 mcg by mouth daily.   Marland Kitchen ELDERBERRY PO Take 1 tablet by mouth daily.   Marland Kitchen etodolac (LODINE) 400 MG tablet Take 400 mg by mouth as needed.   . gabapentin (NEURONTIN) 100 MG capsule Take 1 capsule by mouth as needed.   . Multiple Vitamins-Minerals (AIRBORNE) CHEW Chew 1 tablet by mouth daily.   . rosuvastatin (CRESTOR) 20 MG tablet Take 1 tablet (20 mg total) by mouth daily.   Marland Kitchen thyroid (NP THYROID) 30 MG tablet TAKE 1 TABLET BY MOUTH EVERY DAY   . traZODone (DESYREL) 50 MG tablet TAKE 1/2 TO 1 TABLET(25 TO 50 MG) BY MOUTH AT BEDTIME AS  NEEDED FOR SLEEP   . TURMERIC CURCUMIN PO Take 1,000 mg by mouth daily.   . vitamin C (ASCORBIC ACID) 500 MG tablet Take 500 mg by mouth daily.   . vitamin E 400 UNIT capsule Take 1 tablet by mouth daily.   . Zinc 50 MG TABS Take by mouth.    No facility-administered encounter medications on file as of 03/27/2021.   Comprehensive medication review performed; Spoke to patient regarding cholesterol  Lipid Panel    Component Value Date/Time   CHOL 205 (H) 09/02/2020 0856   TRIG 76 09/02/2020 0856   TRIG 98 08/16/2006 1607   HDL 49 09/02/2020 0856   LDLCALC 142 (H) 09/02/2020 0856   LDLDIRECT 158.1 10/30/2013 1557    10-year ASCVD risk score: The ASCVD Risk score Mikey Bussing DC Jr., et al., 2013) failed to calculate for the following reasons:   The 2013 ASCVD risk score is only valid for ages 60 to 66  . Current antihyperlipidemic regimen:  o   . Previous antihyperlipidemic medications tried:   . ASCVD risk enhancing conditions:   . What recent interventions/DTPs have been made by any provider to improve Cholesterol control since last CPP Visit:  . Any recent hospitalizations or ED visits since last visit with CPP?   Marland Kitchen What diet changes have been made to improve Cholesterol?  o   . What exercise is being done to  improve Cholesterol?  o   Adherence Review: Does the patient have >5 day gap between last estimated fill dates?   Multiple unsuccessful attempts to reach patient by phone.   General Assessment Notes:   Star Rating Drugs:  [facility]  Clinical Pharmacist Assistant [phone]

## 2021-03-28 DIAGNOSIS — M25512 Pain in left shoulder: Secondary | ICD-10-CM | POA: Diagnosis not present

## 2021-03-28 NOTE — Telephone Encounter (Cosign Needed)
2 attempts.

## 2021-04-01 NOTE — Telephone Encounter (Cosign Needed)
3rd attempt

## 2021-05-04 DIAGNOSIS — M542 Cervicalgia: Secondary | ICD-10-CM | POA: Diagnosis not present

## 2021-05-12 ENCOUNTER — Telehealth: Payer: PPO

## 2021-05-13 DIAGNOSIS — M542 Cervicalgia: Secondary | ICD-10-CM | POA: Diagnosis not present

## 2021-05-13 DIAGNOSIS — R030 Elevated blood-pressure reading, without diagnosis of hypertension: Secondary | ICD-10-CM | POA: Diagnosis not present

## 2021-05-15 ENCOUNTER — Telehealth: Payer: Self-pay | Admitting: Family Medicine

## 2021-05-15 NOTE — Telephone Encounter (Signed)
Spoke with patient to schedule Medicare Annual Wellness Visit (AWV) either virtually or in office.   She was at work and will call back to schedule  Last AWV 06/14/18 please schedule at anytime with [facility] [place] 1 or 2   This should be a 45 minute visit.

## 2021-05-27 DIAGNOSIS — M542 Cervicalgia: Secondary | ICD-10-CM | POA: Diagnosis not present

## 2021-05-27 DIAGNOSIS — M50322 Other cervical disc degeneration at C5-C6 level: Secondary | ICD-10-CM | POA: Diagnosis not present

## 2021-05-27 DIAGNOSIS — M4802 Spinal stenosis, cervical region: Secondary | ICD-10-CM | POA: Diagnosis not present

## 2021-06-09 ENCOUNTER — Other Ambulatory Visit: Payer: Self-pay | Admitting: Family Medicine

## 2021-06-11 ENCOUNTER — Other Ambulatory Visit: Payer: Self-pay

## 2021-06-11 ENCOUNTER — Ambulatory Visit (INDEPENDENT_AMBULATORY_CARE_PROVIDER_SITE_OTHER): Payer: PPO | Admitting: Family Medicine

## 2021-06-11 ENCOUNTER — Encounter: Payer: Self-pay | Admitting: Family Medicine

## 2021-06-11 VITALS — BP 124/70 | HR 90 | Temp 97.9°F | Wt 139.3 lb

## 2021-06-11 DIAGNOSIS — E039 Hypothyroidism, unspecified: Secondary | ICD-10-CM | POA: Diagnosis not present

## 2021-06-11 NOTE — Progress Notes (Signed)
Established Patient Office Visit  Subjective:  Patient ID: Julie [name], female    DOB: 08-25-1941  Age: 80 y.o. MRN: [id number]  CC:  Chief Complaint  Patient presents with   Medication Refill    Refill thyroid meds    HPI Julie [name] presents for follow-up regarding hypothyroidism.  She is on low-dose replacement.  Has not had labs in over a year for her thyroid.  She was taking Crestor for hyperlipidemia but had some nonspecific dizziness and fatigue.  She states that symptoms went away after stopping the Crestor.  She is not interested in checking her cholesterol or exploring any other statins at this time.  Generally feels well.  She has planned trip to [place] coming up in September and is excited about that.  She is compliant with her medications.  Takes her thyroid medication first thing in the morning on empty stomach.  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   Other bursitis disorders    history   Personal history of colonic polyps    PONV (postoperative nausea and vomiting)    Pre-diabetes    Pure hypercholesterolemia    Spondylolisthesis, cervical region    SVD (spontaneous vaginal delivery)    x 1   Unspecified disorder of thyroid    Unspecified hypothyroidism     Past Surgical History:  Procedure Laterality Date   ABDOMINAL HYSTERECTOMY     ANTERIOR CERVICAL DECOMP/DISCECTOMY FUSION N/A 11/11/2017   Procedure: Cervical seven - Thoracic one  Anterior cervical  decompression/discectomy/fusion;  Surgeon: Erline Levine, MD;  Location: [facility];  Service: Neurosurgery;  Laterality: N/A;   ANTERIOR LAT LUMBAR FUSION Left 01/22/2017   Procedure: Left Lumbar three-four, Lumbar four-five  Anterior lateral lumbar interbody fusion;  Surgeon: Erline Levine, MD;  Location: [facility];  Service: Neurosurgery;  Laterality: Left;   BACK SURGERY  2002   L4/L5   BLADDER SUSPENSION N/A 03/07/2014   Procedure: TRANSVAGINAL TAPE (TVT) PROCEDURE WITH CYSTO;  Surgeon: Princess Bruins, MD;  Location: [facility];  Service: Gynecology;  Laterality: N/A;  vaginal    blephorplasty      bilateral    BREAST SURGERY     x 2 breast implants, later removed   COLONOSCOPY     CYSTOCELE REPAIR N/A 03/07/2014   Procedure: ANTERIOR REPAIR (CYSTOCELE);  Surgeon: Princess Bruins, MD;  Location: [facility];  Service: Gynecology;  Laterality: N/A;   devaited septum surgery  1975   EYE SURGERY     bilateral cataract surgery   KNEE ARTHROSCOPY  10/21/2012   right - Procedure: ARTHROSCOPY KNEE;  Surgeon: Mcarthur Rossetti, MD;  Location: [facility];  Service: Orthopedics;  Laterality: Left;  Left Knee Arthroscopy, Debridement, partial synovectomy   LUMBAR LAMINECTOMY     LUMBAR PERCUTANEOUS PEDICLE SCREW 2 LEVEL N/A 01/22/2017   Procedure: Lumbar Percutaneous Pedicle Screw Placement Lumbar three-four, Lumbar four-five;  Surgeon: Erline Levine, MD;  Location: [facility];  Service: Neurosurgery;  Laterality: N/A;   ROBOTIC ASSISTED LAPAROSCOPIC SACROCOLPOPEXY N/A 03/07/2014   Procedure: ROBOTIC ASSISTED LAPAROSCOPIC SACROCOLPOPEXY With Carrolyn Meiers;  Surgeon: Princess Bruins, MD;  Location: [facility];  Service: Gynecology;  Laterality: N/A;   ROBOTIC ASSISTED SUPRACERVICAL HYSTERECTOMY WITH BILATERAL SALPINGO OOPHERECTOMY N/A 03/07/2014   Procedure: ROBOTIC ASSISTED SUPRACERVICAL HYSTERECTOMY WITH BILATERAL SALPINGO OOPHORECTOMY;  Surgeon: Princess Bruins, MD;  Location: [facility];  Service: Gynecology;  Laterality: N/A;  4  hrs.   TONSILLECTOMY     TOTAL KNEE ARTHROPLASTY Left 12/30/2012   Procedure: TOTAL KNEE ARTHROPLASTY;  Surgeon: Mcarthur Rossetti, MD;  Location: [facility];  Service: Orthopedics;  Laterality: Left;  Left Total Knee Arthroplasty   TUBAL LIGATION     WISDOM TOOTH EXTRACTION      Family History  Problem Relation Age of Onset   Cancer Mother        breast and lung   Stroke Father    Heart attack Father    Diabetes Sister    Emphysema Maternal Aunt    Colon cancer Neg Hx     Social History   Socioeconomic History   Marital status: Married    Spouse name: Not on file   Number of children: Not on file   Years of education: Not on file   Highest education level: Not on file  Occupational History   Not on file  Tobacco Use   Smoking status: Never   Smokeless tobacco: Never  Vaping Use   Vaping Use: Never used  Substance and Sexual Activity   Alcohol use: No   Drug use: No   Sexual activity: Yes    Birth control/protection: Post-menopausal  Other Topics Concern   Not on file  Social History Narrative   Not on file   Social Determinants of Health   Financial Resource Strain: Low Risk    Difficulty of Paying Living Expenses: Not hard at all  Food Insecurity: Not on file  Transportation Needs: No Transportation Needs   Lack of Transportation (Medical): No   Lack of Transportation (Non-Medical): No  Physical Activity: Not on file  Stress: Not on file  Social Connections: Not on file  Intimate Partner Violence: Not on file    Outpatient Medications Prior to Visit  Medication Sig Dispense Refill   albuterol (VENTOLIN HFA) 108 (90 Base) MCG/ACT inhaler INHALE 2 PUFFS INTO THE LUNGS EVERY 4 HOURS AS NEEDED FOR WHEEZING OR SHORTNESS OF BREATH 8.5 g 1   Bilberry, Vaccinium myrtillus, (BILBERRY PO) Take 1 tablet by mouth daily.     Biotin 3 MG TABS Take 1 tablet by mouth daily.     Calcium Carb-Cholecalciferol (CALCIUM 500 + D3 PO) Take 1 tablet by mouth daily.       Cholecalciferol 5000 UNITS capsule Take 5,000 Units by mouth daily.     colchicine (COLCRYS) 0.6 MG tablet Take 1 tablet (0.6 mg total) by mouth 2 (two) times daily as needed. (Patient taking differently: Take 0.6 mg by mouth as needed.) 60 tablet 3   Cyanocobalamin (VITAMIN B-12 PO) Take 5,000 mcg by mouth daily.     ELDERBERRY PO Take 1 tablet by mouth daily.     etodolac (LODINE) 400 MG tablet Take 400 mg by mouth as needed.     gabapentin (NEURONTIN) 100 MG capsule Take 1 capsule by mouth as needed.     Multiple Vitamins-Minerals (AIRBORNE) CHEW Chew 1 tablet by mouth daily.     thyroid (NP THYROID)  30 MG tablet TAKE 1 TABLET BY MOUTH EVERY DAY 90 tablet 3   traZODone (DESYREL) 50 MG tablet TAKE 1/2 TO 1 TABLET(25 TO 50 MG) BY MOUTH AT BEDTIME AS NEEDED FOR SLEEP 30 tablet 5   TURMERIC CURCUMIN PO Take 1,000 mg by mouth daily.     vitamin C (ASCORBIC ACID) 500 MG tablet Take 500 mg by mouth daily.     vitamin E 400 UNIT capsule Take 1 tablet by mouth daily.     Zinc 50 MG TABS Take by mouth.     ciprofloxacin (CIPRO) 500 MG tablet Take 1 tablet (500 mg total) by mouth 2 (two) times daily. 10 tablet 0   rosuvastatin (CRESTOR) 20 MG tablet Take 1 tablet (20 mg total) by mouth daily. 90 tablet 3   No facility-administered medications prior to visit.    Allergies  Allergen Reactions   Penicillins Anaphylaxis    REACTION: causes tongue to swell Has patient had a PCN reaction causing immediate rash, facial/tongue/throat swelling, SOB or lightheadedness with hypotension: Yes swelling Has patient had a PCN reaction causing severe rash involving mucus membranes or skin necrosis:  Has patient had a PCN reaction that required hospitalization no Has patient had a PCN reaction occurring within the last 10 years: no If all of the above answers are "NO", then may proceed with Cephalosporin use.    Adhesive [Tape] Other (See Comments)    Please use paper tape   Codeine Nausea Only   Lisinopril      REACTION: wheezing   Other Other (See Comments)    Pt was having knee surgery and was given an epidural, which did not work.   Percocet [Oxycodone-Acetaminophen] Nausea And Vomiting    ROS Review of Systems  Constitutional:  Negative for appetite change and unexpected weight change.  Gastrointestinal:  Negative for constipation.  Endocrine: Negative for cold intolerance.     Objective:    Physical Exam Vitals reviewed.  Constitutional:      Appearance: Normal appearance.  Cardiovascular:     Rate and Rhythm: Normal rate and regular rhythm.  Pulmonary:     Effort: Pulmonary effort is normal.     Breath sounds: Normal breath sounds.  Musculoskeletal:     Right lower leg: No edema.     Left lower leg: No edema.  Neurological:     Mental Status: She is alert.    BP 124/70 (BP Location: Left Arm, Patient Position: Sitting, Cuff Size: Normal)   Pulse 90   Temp 97.9 F (36.6 C) (Oral)   Wt 139 lb 4.8 oz (63.2 kg)   SpO2 96%   BMI 34.87 kg/m  Wt Readings from Last 3 Encounters:  06/11/21 139 lb 4.8 oz (63.2 kg)  03/26/21 133 lb 11.2 oz (60.6 kg)  09/24/20 131 lb (59.4 kg)     Health Maintenance Due  Topic Date Due   FOOT EXAM  Never done   URINE MICROALBUMIN  Never done   Zoster Vaccines- Shingrix (1 of 2) Never done   COVID-19 Vaccine (3 - Booster for Pfizer series) 06/03/2020   HEMOGLOBIN A1C  06/13/2020   INFLUENZA VACCINE  06/02/2021    There are no preventive care reminders to display for this patient.  Lab Results  Component Value Date   TSH 2.18 12/15/2019   Lab Results  Component Value Date   WBC 7.8 09/02/2020   HGB 12.0 09/02/2020   HCT 35.4 09/02/2020   MCV 90 09/02/2020   PLT  261 09/02/2020   Lab Results  Component Value Date   NA 143 09/02/2020   K 3.6 09/02/2020   CO2 24 09/02/2020   GLUCOSE 104 (H) 09/02/2020   BUN 12 09/02/2020   CREATININE 0.62 09/02/2020   BILITOT 0.5 09/02/2020   ALKPHOS 73 09/02/2020   AST 17 09/02/2020    ALT 14 09/02/2020   PROT 6.2 09/02/2020   ALBUMIN 4.2 09/02/2020   CALCIUM 9.5 09/02/2020   ANIONGAP 9 03/14/2018   GFR 107.34 02/10/2016   Lab Results  Component Value Date   CHOL 205 (H) 09/02/2020   Lab Results  Component Value Date   HDL 49 09/02/2020   Lab Results  Component Value Date   LDLCALC 142 (H) 09/02/2020   Lab Results  Component Value Date   TRIG 76 09/02/2020   Lab Results  Component Value Date   CHOLHDL 4.2 09/02/2020   Lab Results  Component Value Date   HGBA1C 6.0 (H) 12/15/2019      Assessment & Plan:   Hypothyroidism.  Patient compliant with therapy.  -Recheck TSH today.  Adjust medication if indicated.  Otherwise, we will plan to refill her medication for 1 year and recommend yearly blood work to follow-up     Follow-up: No follow-ups on file.    Carolann Littler, MD

## 2021-06-12 ENCOUNTER — Other Ambulatory Visit: Payer: Self-pay

## 2021-06-12 LAB — TSH: TSH: 2.44 u[IU]/mL (ref 0.35–5.50)

## 2021-06-16 ENCOUNTER — Telehealth: Payer: Self-pay

## 2021-06-16 NOTE — Telephone Encounter (Signed)
Patient called the after hours line and stated that the pharmacy has a prescription for her, for thyroid medication but it is not synthroid and she wanted to me sure this is the correct prescription.   Thyroid (NP THYROID) 30 MG tablet Last filled 12/02/2020   Please advise.

## 2021-06-17 NOTE — Telephone Encounter (Signed)
Please advise. I do not see that synthroid is made in 30 mg tablets.   What should the strength and directions be for the new prescription?

## 2021-06-17 NOTE — Telephone Encounter (Signed)
Spoke with the patient. She stated that she has already straightened this out. The pharmacy made a mistake and gave her a different medication but has resolved this issue.

## 2021-06-23 DIAGNOSIS — M542 Cervicalgia: Secondary | ICD-10-CM | POA: Diagnosis not present

## 2021-06-23 DIAGNOSIS — M5030 Other cervical disc degeneration, unspecified cervical region: Secondary | ICD-10-CM | POA: Diagnosis not present

## 2021-06-23 DIAGNOSIS — M47812 Spondylosis without myelopathy or radiculopathy, cervical region: Secondary | ICD-10-CM | POA: Diagnosis not present

## 2021-06-23 DIAGNOSIS — M5412 Radiculopathy, cervical region: Secondary | ICD-10-CM | POA: Diagnosis not present

## 2021-06-24 ENCOUNTER — Telehealth: Payer: Self-pay | Admitting: Family Medicine

## 2021-06-24 NOTE — Telephone Encounter (Signed)
Left message for patient to call back and schedule Medicare Annual Wellness Visit (AWV) either virtually or in office.   Left  my Julie [name] number [phone]   Last AWV  06/14/18 Please schedule at anytime with [facility] Nurse Health Advisor 1 or 2   This should be a 45 minute visit.

## 2021-07-23 ENCOUNTER — Telehealth: Payer: Self-pay | Admitting: Pharmacist

## 2021-07-23 NOTE — Chronic Care Management (AMB) (Signed)
Chronic Care Management Pharmacy Assistant   Name: Julie [name]  MRN: [id number] DOB: 11-Sep-1941   Reason for Encounter: Disease State / Hypertension and Hypercholesterolemia Assessment Call   Conditions to be addressed/monitored: HTN, HLD, and Hypothyroidism, Osteoporosis, Gout, Prediabetes  Recent office visits:  06/11/2021 Julie Littler MD (PCP) - Patient was seen for Hypothyroidism.  Discontinued Cipro 500mg  and Rosuvastatin 20mg .   No follow up noted.   Recent consult visits:  05/27/2021 Julie [name] (Nuerosurgery) Seen for Cervicalgia. No note on file.   05/13/2021 Julie [name] (Nuerosurgery) Seen for Cervicalgia. No note on file.   05/04/2021 Julie [name] (Sports Medicine) Seen for Cervicalgia. No note on file.   03/28/2021 Julie [name] (Orthopedic Surgery) Seen for left shoulder pain. No note on file.  Hospital visits:  None in previous 6 months  Medications: Outpatient Encounter Medications as of 07/23/2021  Medication Sig Note   albuterol (VENTOLIN HFA) 108 (90 Base) MCG/ACT inhaler INHALE 2 PUFFS INTO THE LUNGS EVERY 4 HOURS AS NEEDED FOR WHEEZING OR SHORTNESS OF BREATH    Bilberry, Vaccinium myrtillus, (BILBERRY PO) Take 1 tablet by mouth daily.    Biotin 3 MG TABS Take 1 tablet by mouth daily.    Calcium Carb-Cholecalciferol (CALCIUM 500 + D3 PO) Take 1 tablet by mouth daily.     Cholecalciferol 5000 UNITS capsule Take 5,000 Units by mouth daily.    colchicine (COLCRYS) 0.6 MG tablet Take 1 tablet (0.6 mg total) by mouth 2 (two) times daily as needed. (Patient taking differently: Take 0.6 mg by mouth as needed.) 06/14/2018: Takes prn   Cyanocobalamin (VITAMIN B-12 PO) Take 5,000 mcg by mouth daily.    ELDERBERRY PO Take 1 tablet by mouth daily.    etodolac (LODINE) 400 MG tablet Take 400 mg by mouth as needed.    gabapentin (NEURONTIN) 100 MG capsule Take 1 capsule by mouth as needed.    Multiple Vitamins-Minerals (AIRBORNE) CHEW Chew 1 tablet by mouth  daily.    thyroid (NP THYROID) 30 MG tablet TAKE 1 TABLET BY MOUTH EVERY DAY    traZODone (DESYREL) 50 MG tablet TAKE 1/2 TO 1 TABLET(25 TO 50 MG) BY MOUTH AT BEDTIME AS NEEDED FOR SLEEP    TURMERIC CURCUMIN PO Take 1,000 mg by mouth daily.    vitamin C (ASCORBIC ACID) 500 MG tablet Take 500 mg by mouth daily.    vitamin E 400 UNIT capsule Take 1 tablet by mouth daily.    Zinc 50 MG TABS Take by mouth.    No facility-administered encounter medications on file as of 07/23/2021.   Fill History: THYROID NP 0.5GR (30MG ) TABLETS 06/14/2021 90   TRAZODONE 50MG  TABLETS 07/07/2020   GABAPENTIN 100MG  CAPSULES 10/28/2020 10   ETODOLAC ER 400MG  TABLETS 07/07/2020 30   ALBUTEROL HFA INH (200 PUFFS)8.5GM 10/10/2019 16    Reviewed chart prior to disease state call. Spoke with patient regarding BP  Recent Office Vitals: BP Readings from Last 3 Encounters:  06/11/21 124/70  03/26/21 132/70  09/03/20 117/61   Pulse Readings from Last 3 Encounters:  06/11/21 90  03/26/21 81  09/03/20 (!) 54    Wt Readings from Last 3 Encounters:  06/11/21 139 lb 4.8 oz (63.2 kg)  03/26/21 133 lb 11.2 oz (60.6 kg)  09/24/20 131 lb (59.4 kg)     Kidney Function Lab Results  Component Value Date/Time   CREATININE 0.62 09/02/2020 08:56 AM   CREATININE 0.55 03/14/2018 12:48 PM   CREATININE 0.63 07/29/2011 09:44  AM   GFR 107.34 02/10/2016 10:05 AM   GFRNONAA 85 09/02/2020 08:56 AM   GFRNONAA >60 07/29/2011 09:44 AM   GFRAA 98 09/02/2020 08:56 AM   GFRAA >60 07/29/2011 09:44 AM    BMP Latest Ref Rng & Units 09/02/2020 03/14/2018 11/09/2017  Glucose 65 - 99 mg/dL 104(H) 100(H) 92  BUN 8 - 27 mg/dL 12 11 12   Creatinine 0.57 - 1.00 mg/dL 0.62 0.55 0.62  BUN/Creat Ratio 12 - 28 19 - -  Sodium 134 - 144 mmol/L 143 140 136  Potassium 3.5 - 5.2 mmol/L 3.6 4.1 3.9  Chloride 96 - 106 mmol/L 105 107 104  CO2 20 - 29 mmol/L 24 24 24   Calcium 8.7 - 10.3 mg/dL 9.5 9.1 9.1     07/23/2021 Name: Julie [name] MRN: [id number] DOB: August 31, 1941 Julie [name] is a 80 y.o. year old female who is a primary care patient of Burchette, Alinda Sierras, MD.  Comprehensive medication review performed; Spoke to patient regarding cholesterol  Lipid Panel    Component Value Date/Time   CHOL 205 (H) 09/02/2020 0856   TRIG 76 09/02/2020 0856   TRIG 98 08/16/2006 1607   HDL 49 09/02/2020 0856   LDLCALC 142 (H) 09/02/2020 0856   LDLDIRECT 158.1 10/30/2013 1557    10-year ASCVD risk score: The ASCVD Risk score (Arnett DK, et al., 2019) failed to calculate for the following reasons:   The 2019 ASCVD risk score is only valid for ages 50 to 2   Notes: Patient does not want to schedule an appointment with CPP at this time, she has too many isues going on with her family.  She was unable to do an assessment with me today and will not be available until Monday and states I can try to call her then if I can reach her.  Attempted to contact patient a 2nd time.  Attempted to contact patient a 3rd time. Care Gaps:  AWV - Julie [name] messaged to schedule Foot exam - never done Urine Microalbumin - never done Zoster vaccines - never done Covid 19 vaccine booster 3 - overdue HGA1C - overdue Flu vaccine - due  Star Rating Drugs: None   [place] [phone]

## 2021-07-24 ENCOUNTER — Telehealth: Payer: Self-pay | Admitting: Family Medicine

## 2021-07-24 NOTE — Telephone Encounter (Signed)
Left message for patient to call back and schedule Medicare Annual Wellness Visit (AWV) either virtually or in office. Left  my Herbie Drape number [phone]   Last AWV ;06/14/18 please schedule at anytime with [facility] Nurse Health Advisor 1 or 2   This should be a 45 minute visit.

## 2021-09-10 ENCOUNTER — Other Ambulatory Visit: Payer: Self-pay | Admitting: Family Medicine

## 2021-09-22 ENCOUNTER — Ambulatory Visit (INDEPENDENT_AMBULATORY_CARE_PROVIDER_SITE_OTHER): Payer: PPO | Admitting: Family Medicine

## 2021-09-22 VITALS — BP 132/78 | HR 66 | Temp 98.0°F | Wt 131.3 lb

## 2021-09-22 DIAGNOSIS — R7303 Prediabetes: Secondary | ICD-10-CM | POA: Diagnosis not present

## 2021-09-22 LAB — POCT GLYCOSYLATED HEMOGLOBIN (HGB A1C): Hemoglobin A1C: 5.6 % (ref 4.0–5.6)

## 2021-09-22 NOTE — Progress Notes (Signed)
Established Patient Office Visit  Subjective:  Patient ID: Julie [name], female    DOB: Jan 19, 1941  Age: 80 y.o. MRN: [id number]  CC:  Chief Complaint  Patient presents with   Follow-up    A1C check     HPI Julie [name] presents for follow-up to reassess her diabetes.  Last A1c 6.0%.  She is not taking any medication for this currently.  She has gone on a low glycemic type diet.  She has lost about 8 pounds.  She feels better overall.  No polyuria or polydipsia.  Just got back recently from a trip to [place].  Required lots of walking.  She thinks is helped with the weight loss.  She has hypothyroidism is on replacement had thyroid functions back in August which were normal.  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   Other bursitis disorders    history   Personal history of colonic polyps    PONV (postoperative nausea and vomiting)    Pre-diabetes    Pure hypercholesterolemia    Spondylolisthesis, cervical region    SVD (spontaneous vaginal delivery)    x 1   Unspecified disorder of thyroid    Unspecified hypothyroidism     Past Surgical History:  Procedure Laterality Date   ABDOMINAL HYSTERECTOMY     ANTERIOR CERVICAL DECOMP/DISCECTOMY FUSION N/A 11/11/2017   Procedure: Cervical seven - Thoracic one  Anterior cervical decompression/discectomy/fusion;  Surgeon: Erline Levine, MD;  Location: [facility];  Service: Neurosurgery;  Laterality: N/A;   ANTERIOR LAT LUMBAR FUSION Left 01/22/2017   Procedure: Left  Lumbar three-four, Lumbar four-five  Anterior lateral lumbar interbody fusion;  Surgeon: Erline Levine, MD;  Location: [facility];  Service: Neurosurgery;  Laterality: Left;   BACK SURGERY  2002   L4/L5   BLADDER SUSPENSION N/A 03/07/2014   Procedure: TRANSVAGINAL TAPE (TVT) PROCEDURE WITH CYSTO;  Surgeon: Princess Bruins, MD;  Location: [facility];  Service: Gynecology;  Laterality: N/A;  vaginal    blephorplasty      bilateral    BREAST SURGERY     x 2 breast implants, later removed   COLONOSCOPY     CYSTOCELE REPAIR N/A 03/07/2014   Procedure: ANTERIOR REPAIR (CYSTOCELE);  Surgeon: Princess Bruins, MD;  Location: [facility];  Service: Gynecology;  Laterality: N/A;   devaited septum surgery  1975   EYE SURGERY     bilateral cataract surgery   KNEE ARTHROSCOPY  10/21/2012   right - Procedure: ARTHROSCOPY KNEE;  Surgeon: Mcarthur Rossetti, MD;  Location: [facility];  Service: Orthopedics;  Laterality: Left;  Left Knee Arthroscopy, Debridement, partial synovectomy   LUMBAR LAMINECTOMY     LUMBAR PERCUTANEOUS PEDICLE SCREW 2 LEVEL N/A 01/22/2017   Procedure: Lumbar Percutaneous Pedicle Screw Placement Lumbar three-four, Lumbar four-five;  Surgeon: Erline Levine, MD;  Location: [facility];  Service: Neurosurgery;  Laterality: N/A;   ROBOTIC ASSISTED LAPAROSCOPIC SACROCOLPOPEXY N/A 03/07/2014  Procedure: ROBOTIC ASSISTED LAPAROSCOPIC SACROCOLPOPEXY With Carrolyn Meiers;  Surgeon: Princess Bruins, MD;  Location: [facility];  Service: Gynecology;  Laterality: N/A;   ROBOTIC ASSISTED SUPRACERVICAL HYSTERECTOMY WITH BILATERAL SALPINGO OOPHERECTOMY N/A 03/07/2014   Procedure: ROBOTIC ASSISTED SUPRACERVICAL HYSTERECTOMY WITH BILATERAL SALPINGO OOPHORECTOMY;  Surgeon: Princess Bruins, MD;  Location: [facility];  Service: Gynecology;  Laterality: N/A;  4 hrs.   TONSILLECTOMY     TOTAL KNEE ARTHROPLASTY Left 12/30/2012   Procedure: TOTAL KNEE ARTHROPLASTY;  Surgeon: Mcarthur Rossetti, MD;  Location: [facility];  Service: Orthopedics;   Laterality: Left;  Left Total Knee Arthroplasty   TUBAL LIGATION     WISDOM TOOTH EXTRACTION      Family History  Problem Relation Age of Onset   Cancer Mother        breast and lung   Stroke Father    Heart attack Father    Diabetes Sister    Emphysema Maternal Aunt    Colon cancer Neg Hx     Social History   Socioeconomic History   Marital status: Married    Spouse name: Not on file   Number of children: Not on file   Years of education: Not on file   Highest education level: GED or equivalent  Occupational History   Not on file  Tobacco Use   Smoking status: Never   Smokeless tobacco: Never  Vaping Use   Vaping Use: Never used  Substance and Sexual Activity   Alcohol use: No   Drug use: No   Sexual activity: Yes    Birth control/protection: Post-menopausal  Other Topics Concern   Not on file  Social History Narrative   Not on file   Social Determinants of Health   Financial Resource Strain: Low Risk    Difficulty of Paying Living Expenses: Not very hard  Food Insecurity: No Food Insecurity   Worried About Running Out of Food in the Last Year: Never true   Ran Out of Food in the Last Year: Never true  Transportation Needs: No Transportation Needs   Lack of Transportation (Medical): No   Lack of Transportation (Non-Medical): No  Physical Activity: Insufficiently Active   Days of Exercise per Week: 3 days   Minutes of Exercise per Session: 40 min  Stress: No Stress Concern Present   Feeling of Stress : Not at all  Social Connections: Unknown   Frequency of Communication with Friends and Family: More than three times a week   Frequency of Social Gatherings with Friends and Family: More than three times a week   Attends Religious Services: Patient refused   Marine scientist or Organizations: Yes   Attends Archivist Meetings: 1 to 4 times per year   Marital Status: Married  Human resources officer Violence: Not on file    Outpatient Medications  Prior to Visit  Medication Sig Dispense Refill   albuterol (VENTOLIN HFA) 108 (90 Base) MCG/ACT inhaler INHALE 2 PUFFS INTO THE LUNGS EVERY 4 HOURS AS NEEDED FOR WHEEZING OR SHORTNESS OF BREATH 8.5 g 1   colchicine (COLCRYS) 0.6 MG tablet Take 1 tablet (0.6 mg total) by mouth 2 (two) times daily as needed. (Patient taking differently: Take 0.6 mg by mouth as needed.) 60 tablet 3   etodolac (LODINE) 400 MG tablet Take 400 mg by mouth as needed.     gabapentin (NEURONTIN) 100 MG capsule Take 1 capsule by mouth as needed.     thyroid (NP THYROID) 30 MG tablet TAKE 1  TABLET BY MOUTH EVERY DAY 90 tablet 3   Bilberry, Vaccinium myrtillus, (BILBERRY PO) Take 1 tablet by mouth daily.     Biotin 3 MG TABS Take 1 tablet by mouth daily.     Calcium Carb-Cholecalciferol (CALCIUM 500 + D3 PO) Take 1 tablet by mouth daily.      Cholecalciferol 5000 UNITS capsule Take 5,000 Units by mouth daily.     Cyanocobalamin (VITAMIN B-12 PO) Take 5,000 mcg by mouth daily.     ELDERBERRY PO Take 1 tablet by mouth daily.     Multiple Vitamins-Minerals (AIRBORNE) CHEW Chew 1 tablet by mouth daily.     traZODone (DESYREL) 50 MG tablet TAKE 1/2 TO 1 TABLET(25 TO 50 MG) BY MOUTH AT BEDTIME AS NEEDED FOR SLEEP 30 tablet 5   TURMERIC CURCUMIN PO Take 1,000 mg by mouth daily.     vitamin C (ASCORBIC ACID) 500 MG tablet Take 500 mg by mouth daily.     vitamin E 400 UNIT capsule Take 1 tablet by mouth daily.     Zinc 50 MG TABS Take by mouth.     No facility-administered medications prior to visit.    Allergies  Allergen Reactions   Penicillins Anaphylaxis    REACTION: causes tongue to swell Has patient had a PCN reaction causing immediate rash, facial/tongue/throat swelling, SOB or lightheadedness with hypotension: Yes swelling Has patient had a PCN reaction causing severe rash involving mucus membranes or skin necrosis:  Has patient had a PCN reaction that required hospitalization no Has patient had a PCN reaction  occurring within the last 10 years: no If all of the above answers are "NO", then may proceed with Cephalosporin use.    Adhesive [Tape] Other (See Comments)    Please use paper tape   Codeine Nausea Only   Lisinopril     REACTION: wheezing   Other Other (See Comments)    Pt was having knee surgery and was given an epidural, which did not work.   Percocet [Oxycodone-Acetaminophen] Nausea And Vomiting    ROS Review of Systems  Constitutional:  Negative for appetite change.  Respiratory:  Negative for shortness of breath.   Cardiovascular:  Negative for chest pain.  Endocrine: Negative for polydipsia and polyuria.     Objective:    Physical Exam Vitals reviewed.  Constitutional:      Appearance: Normal appearance.  Cardiovascular:     Rate and Rhythm: Normal rate and regular rhythm.  Pulmonary:     Effort: Pulmonary effort is normal.     Breath sounds: Normal breath sounds. No wheezing or rales.  Neurological:     Mental Status: She is alert.    BP 132/78 (BP Location: Left Arm, Cuff Size: Normal)   Pulse 66   Temp 98 F (36.7 C) (Oral)   Wt 131 lb 4.8 oz (59.6 kg)   SpO2 95%   BMI 32.86 kg/m  Wt Readings from Last 3 Encounters:  09/22/21 131 lb 4.8 oz (59.6 kg)  06/11/21 139 lb 4.8 oz (63.2 kg)  03/26/21 133 lb 11.2 oz (60.6 kg)     Health Maintenance Due  Topic Date Due   FOOT EXAM  Never done   URINE MICROALBUMIN  Never done   Zoster Vaccines- Shingrix (1 of 2) Never done   COVID-19 Vaccine (3 - Booster for Pfizer series) 02/27/2020    There are no preventive care reminders to display for this patient.  Lab Results  Component Value Date   TSH  2.44 06/11/2021   Lab Results  Component Value Date   WBC 7.8 09/02/2020   HGB 12.0 09/02/2020   HCT 35.4 09/02/2020   MCV 90 09/02/2020   PLT 261 09/02/2020   Lab Results  Component Value Date   NA 143 09/02/2020   K 3.6 09/02/2020   CO2 24 09/02/2020   GLUCOSE 104 (H) 09/02/2020   BUN 12  09/02/2020   CREATININE 0.62 09/02/2020   BILITOT 0.5 09/02/2020   ALKPHOS 73 09/02/2020   AST 17 09/02/2020   ALT 14 09/02/2020   PROT 6.2 09/02/2020   ALBUMIN 4.2 09/02/2020   CALCIUM 9.5 09/02/2020   ANIONGAP 9 03/14/2018   GFR 107.34 02/10/2016   Lab Results  Component Value Date   CHOL 205 (H) 09/02/2020   Lab Results  Component Value Date   HDL 49 09/02/2020   Lab Results  Component Value Date   LDLCALC 142 (H) 09/02/2020   Lab Results  Component Value Date   TRIG 76 09/02/2020   Lab Results  Component Value Date   CHOLHDL 4.2 09/02/2020   Lab Results  Component Value Date   HGBA1C 5.6 09/22/2021      Assessment & Plan:   Problem List Items Addressed This Visit       Unprioritized   Prediabetes - Primary   Relevant Orders   POCT glycosylated hemoglobin (Hb A1C) (Completed)  A1c improved today to 5.6%.  Continue low glycemic diet.  Continue regular exercise habits with walking.  Recommend goal of 150 minutes of moderate exercise per week minimum.  Recommend flu vaccine but patient declines  3-month follow-up, and sooner as needed  No orders of the defined types were placed in this encounter.   Follow-up: Return in about 6 months (around 03/22/2022).    Carolann Littler, MD

## 2021-09-30 ENCOUNTER — Ambulatory Visit (INDEPENDENT_AMBULATORY_CARE_PROVIDER_SITE_OTHER): Payer: PPO

## 2021-09-30 VITALS — Ht 59.5 in | Wt 131.0 lb

## 2021-09-30 DIAGNOSIS — Z0000 Encounter for general adult medical examination without abnormal findings: Secondary | ICD-10-CM

## 2021-09-30 NOTE — Patient Instructions (Signed)
Julie [name] , Thank you for taking time to come for your Medicare Wellness Visit. I appreciate your ongoing commitment to your health goals. Please review the following plan we discussed and let me know if I can assist you in the future.   Screening recommendations/referrals: Colonoscopy: not required Mammogram: completed in 2022 per patient Bone Density: completed 03/05/2015 Recommended yearly ophthalmology/optometry visit for glaucoma screening and checkup Recommended yearly dental visit for hygiene and checkup  Vaccinations: Influenza vaccine: decline Pneumococcal vaccine: completed 02/24/2016 Tdap vaccine: completed 04/19/2017, due 04/20/2027 Shingles vaccine: discussed   Covid-19: 01/02/2020, 12/07/2019. Will send booster date via my chart  Advanced directives: Advance directive discussed with you today.   Conditions/risks identified: none  Next appointment: Follow up in one year for your annual wellness visit    Preventive Care 65 Years and Older, Female Preventive care refers to lifestyle choices and visits with your health care provider that can promote health and wellness. What does preventive care include? A yearly physical exam. This is also called an annual well check. Dental exams once or twice a year. Routine eye exams. Ask your health care provider how often you should have your eyes checked. Personal lifestyle choices, including: Daily care of your teeth and gums. Regular physical activity. Eating a healthy diet. Avoiding tobacco and drug use. Limiting alcohol use. Practicing safe sex. Taking low-dose aspirin every day. Taking vitamin and mineral supplements as recommended by your health care provider. What happens during an annual well check? The services and screenings done by your health care provider during your annual well check will depend on your age, overall health, lifestyle risk factors, and family history of disease. Counseling  Your health care provider  may ask you questions about your: Alcohol use. Tobacco use. Drug use. Emotional well-being. Home and relationship well-being. Sexual activity. Eating habits. History of falls. Memory and ability to understand (cognition). Work and work Statistician. Reproductive health. Screening  You may have the following tests or measurements: Height, weight, and BMI. Blood pressure. Lipid and cholesterol levels. These may be checked every 5 years, or more frequently if you are over 87 years old. Skin check. Lung cancer screening. You may have this screening every year starting at age 78 if you have a 30-pack-year history of smoking and currently smoke or have quit within the past 15 years. Fecal occult blood test (FOBT) of the stool. You may have this test every year starting at age 25. Flexible sigmoidoscopy or colonoscopy. You may have a sigmoidoscopy every 5 years or a colonoscopy every 10 years starting at age 71. Hepatitis C blood test. Hepatitis B blood test. Sexually transmitted disease (STD) testing. Diabetes screening. This is done by checking your blood sugar (glucose) after you have not eaten for a while (fasting). You may have this done every 1-3 years. Bone density scan. This is done to screen for osteoporosis. You may have this done starting at age 57. Mammogram. This may be done every 1-2 years. Talk to your health care provider about how often you should have regular mammograms. Talk with your health care provider about your test results, treatment options, and if necessary, the need for more tests. Vaccines  Your health care provider may recommend certain vaccines, such as: Influenza vaccine. This is recommended every year. Tetanus, diphtheria, and acellular pertussis (Tdap, Td) vaccine. You may need a Td booster every 10 years. Zoster vaccine. You may need this after age 23. Pneumococcal 13-valent conjugate (PCV13) vaccine. One dose is recommended after  age 20. Pneumococcal  polysaccharide (PPSV23) vaccine. One dose is recommended after age 7. Talk to your health care provider about which screenings and vaccines you need and how often you need them. This information is not intended to replace advice given to you by your health care provider. Make sure you discuss any questions you have with your health care provider. Document Released: 11/15/2015 Document Revised: 07/08/2016 Document Reviewed: 08/20/2015 Elsevier Interactive Patient Education  2017 [employer] Prevention in the Home Falls can cause injuries. They can happen to people of all ages. There are many things you can do to make your home safe and to help prevent falls. What can I do on the outside of my home? Regularly fix the edges of walkways and driveways and fix any cracks. Remove anything that might make you trip as you walk through a door, such as a raised step or threshold. Trim any bushes or trees on the path to your home. Use bright outdoor lighting. Clear any walking paths of anything that might make someone trip, such as rocks or tools. Regularly check to see if handrails are loose or broken. Make sure that both sides of any steps have handrails. Any raised decks and porches should have guardrails on the edges. Have any leaves, snow, or ice cleared regularly. Use sand or salt on walking paths during winter. Clean up any spills in your garage right away. This includes oil or grease spills. What can I do in the bathroom? Use night lights. Install grab bars by the toilet and in the tub and shower. Do not use towel bars as grab bars. Use non-skid mats or decals in the tub or shower. If you need to sit down in the shower, use a plastic, non-slip stool. Keep the floor dry. Clean up any water that spills on the floor as soon as it happens. Remove soap buildup in the tub or shower regularly. Attach bath mats securely with double-sided non-slip rug tape. Do not have throw rugs and other  things on the floor that can make you trip. What can I do in the bedroom? Use night lights. Make sure that you have a light by your bed that is easy to reach. Do not use any sheets or blankets that are too big for your bed. They should not hang down onto the floor. Have a firm chair that has side arms. You can use this for support while you get dressed. Do not have throw rugs and other things on the floor that can make you trip. What can I do in the kitchen? Clean up any spills right away. Avoid walking on wet floors. Keep items that you use a lot in easy-to-reach places. If you need to reach something above you, use a strong step stool that has a grab bar. Keep electrical cords out of the way. Do not use floor polish or wax that makes floors slippery. If you must use wax, use non-skid floor wax. Do not have throw rugs and other things on the floor that can make you trip. What can I do with my stairs? Do not leave any items on the stairs. Make sure that there are handrails on both sides of the stairs and use them. Fix handrails that are broken or loose. Make sure that handrails are as long as the stairways. Check any carpeting to make sure that it is firmly attached to the stairs. Fix any carpet that is loose or worn. Avoid having throw rugs  at the top or bottom of the stairs. If you do have throw rugs, attach them to the floor with carpet tape. Make sure that you have a light switch at the top of the stairs and the bottom of the stairs. If you do not have them, ask someone to add them for you. What else can I do to help prevent falls? Wear shoes that: Do not have high heels. Have rubber bottoms. Are comfortable and fit you well. Are closed at the toe. Do not wear sandals. If you use a stepladder: Make sure that it is fully opened. Do not climb a closed stepladder. Make sure that both sides of the stepladder are locked into place. Ask someone to hold it for you, if possible. Clearly  mark and make sure that you can see: Any grab bars or handrails. First and last steps. Where the edge of each step is. Use tools that help you move around (mobility aids) if they are needed. These include: Canes. Walkers. Scooters. Crutches. Turn on the lights when you go into a dark area. Replace any light bulbs as soon as they burn out. Set up your furniture so you have a clear path. Avoid moving your furniture around. If any of your floors are uneven, fix them. If there are any pets around you, be aware of where they are. Review your medicines with your doctor. Some medicines can make you feel dizzy. This can increase your chance of falling. Ask your doctor what other things that you can do to help prevent falls. This information is not intended to replace advice given to you by your health care provider. Make sure you discuss any questions you have with your health care provider. Document Released: 08/15/2009 Document Revised: 03/26/2016 Document Reviewed: 11/23/2014 Elsevier Interactive Patient Education  2017 [employer].

## 2021-09-30 NOTE — Progress Notes (Signed)
I connected with  Julie [name] today via telehealth video enabled device and verified that I am speaking with the correct person using two identifiers.   Location: Patient: home Provider: work  Persons participating in virtual visit: patient, provider  I discussed the limitations, risks, security and privacy concerns of performing an evaluation and management service by telephone and the availability of in person appointments. The patient expressed understanding and agreed to proceed.   Some vital signs may be absent or patient reported.     Subjective:   Julie [name] is a 80 y.o. female who presents for Medicare Annual (Subsequent) preventive examination.  Review of Systems     Cardiac Risk Factors include: advanced age (>37men, >94 women);hypertension     Objective:    Today's Vitals   09/30/21 1329  Weight: 131 lb (59.4 kg)  Height: 4' 11.5" (1.511 m)   Body mass index is 26.02 kg/m.  Advanced Directives 09/30/2021 06/14/2018 11/11/2017 11/09/2017 06/01/2017 05/31/2017 01/22/2017  Does Patient Have a Medical Advance Directive? No No No No No No No  Would patient like information on creating a medical advance directive? - - No - Patient declined No - Patient declined No - Patient declined - No - Patient declined  Pre-existing out of facility DNR order (yellow form or pink MOST form) - - - - - - -    Current Medications (verified) Outpatient Encounter Medications as of 09/30/2021  Medication Sig   thyroid (NP THYROID) 30 MG tablet TAKE 1 TABLET BY MOUTH EVERY DAY   albuterol (VENTOLIN HFA) 108 (90 Base) MCG/ACT inhaler INHALE 2 PUFFS INTO THE LUNGS EVERY 4 HOURS AS NEEDED FOR WHEEZING OR SHORTNESS OF BREATH (Patient not taking: Reported on 09/30/2021)   colchicine (COLCRYS) 0.6 MG tablet Take 1 tablet (0.6 mg total) by mouth 2 (two) times daily as needed. (Patient not taking: Reported on 09/30/2021)   etodolac (LODINE) 400 MG tablet Take 400 mg by mouth as needed.  (Patient not taking: Reported on 09/30/2021)   gabapentin (NEURONTIN) 100 MG capsule Take 1 capsule by mouth as needed. (Patient not taking: Reported on 09/30/2021)   No facility-administered encounter medications on file as of 09/30/2021.    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   Other bursitis disorders    history   Personal history of colonic polyps    PONV (postoperative nausea and vomiting)    Pre-diabetes    Pure hypercholesterolemia    Spondylolisthesis, cervical region    SVD (spontaneous vaginal delivery)    x 1   Unspecified disorder of thyroid    Unspecified hypothyroidism    Past Surgical History:  Procedure Laterality Date   ABDOMINAL HYSTERECTOMY     ANTERIOR CERVICAL DECOMP/DISCECTOMY FUSION N/A 11/11/2017   Procedure: Cervical seven - Thoracic one  Anterior cervical decompression/discectomy/fusion;  Surgeon: Vertell Limber,  Broadus John, MD;  Location: [facility];  Service: Neurosurgery;  Laterality: N/A;   ANTERIOR LAT LUMBAR FUSION Left 01/22/2017   Procedure: Left Lumbar three-four, Lumbar four-five  Anterior lateral lumbar interbody fusion;  Surgeon: Erline Levine, MD;  Location: [facility];  Service: Neurosurgery;  Laterality: Left;   BACK SURGERY  2002   L4/L5   BLADDER SUSPENSION N/A 03/07/2014   Procedure: TRANSVAGINAL TAPE (TVT) PROCEDURE WITH CYSTO;  Surgeon: Princess Bruins, MD;   Location: [facility];  Service: Gynecology;  Laterality: N/A;  vaginal    blephorplasty      bilateral    BREAST SURGERY     x 2 breast implants, later removed   COLONOSCOPY     CYSTOCELE REPAIR N/A 03/07/2014   Procedure: ANTERIOR REPAIR (CYSTOCELE);  Surgeon: Princess Bruins, MD;  Location: [facility];  Service: Gynecology;  Laterality: N/A;   devaited septum surgery  1975   EYE SURGERY     bilateral cataract surgery   KNEE ARTHROSCOPY  10/21/2012   right - Procedure: ARTHROSCOPY KNEE;  Surgeon: Mcarthur Rossetti, MD;  Location: [facility];  Service: Orthopedics;  Laterality: Left;  Left Knee Arthroscopy, Debridement, partial synovectomy   LUMBAR LAMINECTOMY     LUMBAR PERCUTANEOUS PEDICLE SCREW 2 LEVEL N/A 01/22/2017   Procedure: Lumbar Percutaneous Pedicle Screw Placement Lumbar three-four, Lumbar four-five;  Surgeon: Erline Levine, MD;  Location: [facility];  Service: Neurosurgery;  Laterality: N/A;   ROBOTIC ASSISTED LAPAROSCOPIC SACROCOLPOPEXY N/A 03/07/2014   Procedure: ROBOTIC ASSISTED LAPAROSCOPIC SACROCOLPOPEXY With Carrolyn Meiers;  Surgeon: Princess Bruins, MD;  Location: [facility];  Service: Gynecology;  Laterality: N/A;   ROBOTIC ASSISTED SUPRACERVICAL HYSTERECTOMY WITH BILATERAL SALPINGO OOPHERECTOMY N/A 03/07/2014   Procedure: ROBOTIC ASSISTED SUPRACERVICAL HYSTERECTOMY WITH BILATERAL SALPINGO OOPHORECTOMY;  Surgeon: Princess Bruins, MD;  Location: [facility];  Service: Gynecology;  Laterality: N/A;  4 hrs.   TONSILLECTOMY     TOTAL KNEE ARTHROPLASTY Left 12/30/2012   Procedure: TOTAL KNEE ARTHROPLASTY;  Surgeon: Mcarthur Rossetti, MD;  Location: [facility];  Service: Orthopedics;  Laterality: Left;  Left Total Knee Arthroplasty   TUBAL LIGATION     WISDOM TOOTH EXTRACTION     Family History  Problem Relation Age of Onset   Cancer Mother        breast and lung   Stroke Father    Heart attack Father    Diabetes Sister    Emphysema Maternal Aunt    Colon cancer Neg Hx    Social History    Socioeconomic History   Marital status: Married    Spouse name: Not on file   Number of children: Not on file   Years of education: Not on file   Highest education level: GED or equivalent  Occupational History   Not on file  Tobacco Use   Smoking status: Never   Smokeless tobacco: Never  Vaping Use   Vaping Use: Never used  Substance and Sexual Activity   Alcohol use: No   Drug use: No   Sexual activity: Yes    Birth control/protection: Post-menopausal  Other Topics Concern   Not on file  Social History Narrative   Not on file   Social Determinants of Health   Financial Resource Strain: Low Risk    Difficulty of Paying Living Expenses: Not hard at all  Food Insecurity: No Food Insecurity   Worried About Charity fundraiser in the Last Year: Never true   [place] in the Last Year: Never true  Transportation Needs: No Data processing manager (Medical): No   Lack of Transportation (Non-Medical): No  Physical Activity: Sufficiently Active   Days of Exercise per Week: 3 days   Minutes of Exercise per Session: 60 min  Stress: No Stress Concern Present   Feeling of Stress : Not at all  Social Connections: Unknown   Frequency of Communication with Friends and Family: More than three times a week   Frequency of Social Gatherings with Friends and Family: More than three times a week   Attends Religious Services: Patient refused   Marine scientist or Organizations: Yes   Attends Archivist Meetings: 1 to 4 times per year   Marital Status: Married    Tobacco Counseling Counseling given: Not Answered   Clinical Intake:  Pre-visit preparation completed: Yes  Pain : No/denies pain     Nutritional Status: BMI 25 -29 Overweight Nutritional Risks: None Diabetes: No  How often do you need to have someone help you when you read instructions, pamphlets, or other written materials from your doctor or pharmacy?: 1 -  Never  Diabetic? no  Interpreter Needed?: No  Information entered by :: NAllen LPN   Activities of Daily Living In your present state of health, do you have any difficulty performing the following activities: 09/30/2021 09/25/2021  Hearing? N N  Vision? N N  Difficulty concentrating or making decisions? N N  Walking or climbing stairs? N N  Dressing or bathing? N N  Doing errands, shopping? N N  Preparing Food and eating ? N N  Using the Toilet? N N  In the past six months, have you accidently leaked urine? Y Y  Do you have problems with loss of bowel control? N N  Managing your Medications? N N  Managing your Finances? N N  Housekeeping or managing your Housekeeping? N N  Some recent data might be hidden    Patient Care Team: Eulas Post, MD as PCP - General (Family Medicine) Erline Levine, MD as Consulting Physician (Neurosurgery) Dermatology, Asa Saunas, Mariam Dollar, [facility] as Pharmacist (Pharmacist)  Indicate any recent Medical Services you may have received from other than [facility] providers in the past year (date may be approximate).     Assessment:   This is a routine wellness examination for Karn[place].  Hearing/Vision screen Vision Screening - Comments:: Regular eye exams, [facility], Dr. Satira Sark  Dietary issues and exercise activities discussed: Current Exercise Habits: Home exercise routine, Type of exercise: walking, Time (Minutes): 60, Frequency (Times/Week): 3, Weekly Exercise (Minutes/Week): 180   Goals Addressed             This Visit's Progress    Patient Stated       09/30/2021, wants to lose more weight, get more exercise, eat healthier, no sugar       Depression Screen PHQ 2/9 Scores 09/30/2021 09/22/2021 07/18/2018 06/14/2018 06/01/2017 01/12/2017 02/24/2016  PHQ - 2 Score 0 0 0 0 0 0 0  PHQ- 9 Score - - 0 - - 3 -    Fall Risk Fall Risk  09/30/2021 09/25/2021 09/22/2021 09/21/2021 09/27/2019  Falls in the past year? 0 0 0 0 0   Comment - - - - Emmi Telephone Survey: data to providers prior to load  Number falls in past yr: - 0 - - -  Injury with Fall? - 0 - - -  Risk for fall due to : No Fall Risks - - - -  Follow up Falls evaluation completed;Education provided;Falls prevention discussed - - - -    FALL RISK PREVENTION PERTAINING TO THE HOME:  Any stairs in or around the home? No  If so, are there any without handrails? N/a  Home free of loose throw rugs in walkways, pet beds, electrical cords, etc? Yes  Adequate lighting in your home to reduce risk of falls? Yes   ASSISTIVE DEVICES UTILIZED TO PREVENT FALLS:  Life alert? No  Use of a cane, walker or w/c? No  Grab bars in the bathroom? No  Shower chair or bench in shower? Yes  Elevated toilet seat or a handicapped toilet? No   TIMED UP AND GO:  Was the test performed? No .      Cognitive Function: MMSE - Mini Mental State Exam 06/14/2018  Not completed: (No Data)     6CIT Screen 09/30/2021  What Year? 0 points  What month? 0 points  What time? 0 points  Count back from 20 0 points  Months in reverse 2 points  Repeat phrase 0 points  Total Score 2    Immunizations Immunization History  Administered Date(s) Administered   Influenza-Unspecified 09/02/2018   PFIZER(Purple Top)SARS-COV-2 Vaccination 12/07/2019, 01/02/2020   Pneumococcal Conjugate-13 02/24/2016   Pneumococcal Polysaccharide-23 02/11/2015   Tdap 04/23/2017    TDAP status: Up to date  Flu Vaccine status: Declined, Education has been provided regarding the importance of this vaccine but patient still declined. Advised may receive this vaccine at local pharmacy or Health Dept. Aware to provide a copy of the vaccination record if obtained from local pharmacy or Health Dept. Verbalized acceptance and understanding.  Pneumococcal vaccine status: Up to date  Covid-19 vaccine status: Completed vaccines  Qualifies for Shingles Vaccine? Yes   Zostavax completed No   Shingrix  Completed?: No.    Education has been provided regarding the importance of this vaccine. Patient has been advised to call insurance company to determine out of pocket expense if they have not yet received this vaccine. Advised may also receive vaccine at local pharmacy or Health Dept. Verbalized acceptance and understanding.  Screening Tests Health Maintenance  Topic Date Due   FOOT EXAM  Never done   URINE MICROALBUMIN  Never done   Zoster Vaccines- Shingrix (1 of 2) Never done   COVID-19 Vaccine (3 - Booster for Pfizer series) 02/27/2020   INFLUENZA VACCINE  01/30/2022 (Originally 06/02/2021)   OPHTHALMOLOGY EXAM  02/25/2022   HEMOGLOBIN A1C  03/22/2022   TETANUS/TDAP  04/24/2027   Pneumonia Vaccine 61+ Years old  Completed   DEXA SCAN  Completed   HPV VACCINES  Aged Out    Health Maintenance  Health Maintenance Due  Topic Date Due   FOOT EXAM  Never done   URINE MICROALBUMIN  Never done   Zoster Vaccines- Shingrix (1 of 2) Never done   COVID-19 Vaccine (3 - Booster for Pfizer series) 02/27/2020    Colorectal cancer screening: No longer required.   Mammogram status: Completed 2022. Repeat every year  Bone Density status: Completed 03/05/2015.   Lung Cancer Screening: (Low Dose CT Chest recommended if Age 78-80 years, 30 pack-year currently smoking OR have quit w/in 15years.) does not qualify.   Lung Cancer Screening Referral: no  Additional Screening:  Hepatitis C Screening: does not qualify;   Vision Screening: Recommended annual ophthalmology exams for early detection of glaucoma and other disorders of the eye. Is the patient up to date with their annual eye exam?  Yes  Who is the provider or what is the name of the office in which the patient attends annual eye exams? Dr. Satira Sark If pt is not established with a provider, would they like to be referred to a provider to establish care? No .   Dental Screening: Recommended annual dental exams for proper oral  hygiene  Community Resource Referral / Chronic Care Management: CRR required this visit?  No   CCM required this visit?  No      Plan:     I have personally reviewed and noted the following in the patient's chart:   Medical and social history Use of alcohol, tobacco or illicit drugs  Current medications and supplements including opioid prescriptions.  Functional ability and status Nutritional status Physical activity Advanced directives List of other physicians Hospitalizations, surgeries, and ER visits in previous 12 months Vitals Screenings to include cognitive, depression, and falls Referrals and appointments  In addition, I have reviewed and discussed with patient certain preventive protocols, quality metrics, and best practice recommendations. A written personalized care plan for preventive services as well as general preventive health recommendations were provided to patient.     Kellie Simmering, LPN   [id number]   Nurse Notes: none

## 2021-10-13 ENCOUNTER — Other Ambulatory Visit: Payer: Self-pay

## 2021-10-13 ENCOUNTER — Encounter: Payer: Self-pay | Admitting: Podiatry

## 2021-10-13 ENCOUNTER — Ambulatory Visit (INDEPENDENT_AMBULATORY_CARE_PROVIDER_SITE_OTHER): Payer: PPO

## 2021-10-13 ENCOUNTER — Ambulatory Visit: Payer: PPO | Admitting: Podiatry

## 2021-10-13 DIAGNOSIS — M2042 Other hammer toe(s) (acquired), left foot: Secondary | ICD-10-CM | POA: Diagnosis not present

## 2021-10-13 DIAGNOSIS — M2041 Other hammer toe(s) (acquired), right foot: Secondary | ICD-10-CM

## 2021-10-13 DIAGNOSIS — L84 Corns and callosities: Secondary | ICD-10-CM | POA: Diagnosis not present

## 2021-10-13 NOTE — Progress Notes (Signed)
Subjective:   Patient ID: Julie [name], female   DOB: 80 y.o.   MRN: [id number]   HPI Patient presents with painful callus between the second and third toe on the right foot and states that it gets very sore when she tries to walk.  Patient has had a trend in the past and they have gotten worse over the last few years.  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      Objective:  Physical Exam Vitals and nursing note reviewed.  Constitutional:      Appearance: She is well-developed.  Pulmonary:     Effort: Pulmonary effort is normal.  Musculoskeletal:        General: Normal range of motion.  Skin:    General: Skin is warm.  Neurological:     Mental Status: She is alert.    Neurovascular status intact muscle strength found to be adequate range of motion within normal limits.  Patient does have keratotic lesion on the second and third toes lateral second medial third with rotation of the third toe against the second toe and obvious structural abnormalities.  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  I then discussed distal arthroplasty digit 3 proximal arthroplasty digit to right and explained to her the surgery and she wants to get this done but wants to wait till the new year.  I encouraged her to call and we will see her prior to the procedure to go over in greater detail  X-rays indicate there is rotation of the third toe against the second toe right foot with patient having a lot of arthritis and other chronic issues secondary to foot structure

## 2021-10-31 ENCOUNTER — Ambulatory Visit (INDEPENDENT_AMBULATORY_CARE_PROVIDER_SITE_OTHER): Payer: PPO | Admitting: Family Medicine

## 2021-10-31 ENCOUNTER — Other Ambulatory Visit: Payer: Self-pay

## 2021-10-31 ENCOUNTER — Encounter (HOSPITAL_BASED_OUTPATIENT_CLINIC_OR_DEPARTMENT_OTHER): Payer: Self-pay

## 2021-10-31 ENCOUNTER — Encounter: Payer: Self-pay | Admitting: Family Medicine

## 2021-10-31 VITALS — BP 133/80 | HR 71 | Temp 98.0°F | Ht 62.0 in | Wt 128.1 lb

## 2021-10-31 DIAGNOSIS — Z85828 Personal history of other malignant neoplasm of skin: Secondary | ICD-10-CM | POA: Diagnosis not present

## 2021-10-31 DIAGNOSIS — Z96652 Presence of left artificial knee joint: Secondary | ICD-10-CM | POA: Insufficient documentation

## 2021-10-31 DIAGNOSIS — E039 Hypothyroidism, unspecified: Secondary | ICD-10-CM | POA: Diagnosis not present

## 2021-10-31 DIAGNOSIS — I10 Essential (primary) hypertension: Secondary | ICD-10-CM | POA: Insufficient documentation

## 2021-10-31 DIAGNOSIS — J45909 Unspecified asthma, uncomplicated: Secondary | ICD-10-CM | POA: Diagnosis not present

## 2021-10-31 DIAGNOSIS — M546 Pain in thoracic spine: Secondary | ICD-10-CM | POA: Diagnosis not present

## 2021-10-31 DIAGNOSIS — R0781 Pleurodynia: Secondary | ICD-10-CM

## 2021-10-31 DIAGNOSIS — M25512 Pain in left shoulder: Secondary | ICD-10-CM

## 2021-10-31 LAB — CBC
HCT: 37 % (ref 36.0–46.0)
Hemoglobin: 12.1 g/dL (ref 12.0–15.0)
MCH: 30.1 pg (ref 26.0–34.0)
MCHC: 32.7 g/dL (ref 30.0–36.0)
MCV: 92 fL (ref 80.0–100.0)
Platelets: 232 10*3/uL (ref 150–400)
RBC: 4.02 MIL/uL (ref 3.87–5.11)
RDW: 14.6 % (ref 11.5–15.5)
WBC: 7.8 10*3/uL (ref 4.0–10.5)
nRBC: 0 % (ref 0.0–0.2)

## 2021-10-31 LAB — BASIC METABOLIC PANEL
Anion gap: 8 (ref 5–15)
BUN: 18 mg/dL (ref 8–23)
CO2: 25 mmol/L (ref 22–32)
Calcium: 9.1 mg/dL (ref 8.9–10.3)
Chloride: 106 mmol/L (ref 98–111)
Creatinine, Ser: 0.49 mg/dL (ref 0.44–1.00)
GFR, Estimated: >60 mL/min (ref >60)
Glucose, Bld: 121 mg/dL — ABNORMAL HIGH (ref 70–99)
Potassium: 3.8 mmol/L (ref 3.5–5.1)
Sodium: 139 mmol/L (ref 135–145)

## 2021-10-31 LAB — TROPONIN I (HIGH SENSITIVITY): Troponin I (High Sensitivity): 3 ng/L (ref <18)

## 2021-10-31 LAB — D-DIMER, QUANTITATIVE: D-Dimer, Quant: 0.57 ug/mL-FEU — ABNORMAL HIGH (ref 0.00–0.50)

## 2021-10-31 NOTE — ED Triage Notes (Signed)
Pt states left shoulder pain x2days, saw pmd who sent here to r/o clot.  States posterior shoulderblade, no radiation to neck, describes as sharp.  Took ibuprofen last night without relief.

## 2021-10-31 NOTE — Patient Instructions (Signed)
Atypical L shoulder pain/pleuritic pain.  Advised to go to ER for further w/u.  May need D-dimer, CT, cardiac w/u, etc.  She is agreeable and will go to [facility]

## 2021-10-31 NOTE — Progress Notes (Signed)
Subjective:     Patient ID: Julie [name], female    DOB: 1941/04/07, 80 y.o.   MRN: [id number]  Chief Complaint  Patient presents with   Shoulder Pain    Left shoulder pain that started on 10/30/2021 Not as bad today, took Advil last night       HPI L shoulder pain since 2 days  No injury.  Woke up ok Wed morning(2 days ago) and then Sudden onset when getting ready for work, pain under L posterior shoulder blade. "hurt to breathe" but no diff breathing.  Lasted all day and some better yesterday.  Better today-took advil last pm.  Pain back of shoulder blade.  Used blue rub as well-not help.  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 No CP Didn't go to ER as was working  Reviewed chart-saw Card 08/19/20 for CP.  Did CTA.  Stress test recommended but never done.   Having toe surgery 11/27/20. Dr. Paulla Dolly  Health Maintenance Due  Topic Date Due   FOOT EXAM  Never done   URINE MICROALBUMIN  Never done   Zoster Vaccines- Shingrix (1 of 2) Never done   COVID-19 Vaccine (3 - Booster for Pfizer series) 02/27/2020    Past Medical History:  Diagnosis Date   Arthritis    lower back shoulder   Arthropathy, unspecified, site unspecified    Cancer (HCC)    skin -spots on face removed   Contact dermatitis and other eczema, due to unspecified cause    History   Depression    GERD (gastroesophageal reflux disease)    history - no current prob - no med   Hypertension    was taking lisinopril but had an allergic reaction to medicaion   Neuromuscular disorder (HCC)    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   Other bursitis disorders    history   Personal history of colonic polyps    PONV (postoperative nausea and vomiting)    Pre-diabetes    Pure hypercholesterolemia    Spondylolisthesis, cervical region    SVD (spontaneous vaginal delivery)    x 1   Unspecified disorder of thyroid    Unspecified hypothyroidism     Past Surgical History:  Procedure  Laterality Date   ABDOMINAL HYSTERECTOMY     ANTERIOR CERVICAL DECOMP/DISCECTOMY FUSION N/A 11/11/2017   Procedure: Cervical seven - Thoracic one  Anterior cervical decompression/discectomy/fusion;  Surgeon: Erline Levine, MD;  Location: [place];  Service: Neurosurgery;  Laterality: N/A;   ANTERIOR LAT LUMBAR FUSION Left 01/22/2017   Procedure: Left Lumbar three-four, Lumbar four-five  Anterior lateral lumbar interbody fusion;  Surgeon: Erline Levine, MD;  Location: [place];  Service: Neurosurgery;  Laterality: Left;   BACK SURGERY  2002   L4/L5   BLADDER SUSPENSION N/A 03/07/2014   Procedure: TRANSVAGINAL TAPE (TVT) PROCEDURE WITH CYSTO;  Surgeon: Princess Bruins, MD;  Location: [facility];  Service: Gynecology;  Laterality: N/A;  vaginal    blephorplasty      bilateral    BREAST SURGERY     x 2 breast implants, later removed   COLONOSCOPY     CYSTOCELE REPAIR N/A 03/07/2014   Procedure: ANTERIOR REPAIR (CYSTOCELE);  Surgeon: Princess Bruins, MD;  Location: [facility];  Service: Gynecology;  Laterality: N/A;   devaited septum surgery  1975   EYE SURGERY     bilateral cataract surgery   KNEE ARTHROSCOPY  10/21/2012   right - Procedure: ARTHROSCOPY KNEE;  Surgeon: Mcarthur Rossetti, MD;  Location: [facility];  Service: Orthopedics;  Laterality: Left;  Left Knee Arthroscopy, Debridement, partial synovectomy   LUMBAR LAMINECTOMY     LUMBAR PERCUTANEOUS PEDICLE SCREW 2 LEVEL N/A 01/22/2017   Procedure: Lumbar Percutaneous Pedicle Screw Placement Lumbar three-four, Lumbar four-five;  Surgeon: Erline Levine, MD;  Location: [place];  Service: Neurosurgery;  Laterality: N/A;   ROBOTIC ASSISTED LAPAROSCOPIC SACROCOLPOPEXY N/A 03/07/2014   Procedure: ROBOTIC ASSISTED LAPAROSCOPIC SACROCOLPOPEXY With Carrolyn Meiers;  Surgeon: Princess Bruins, MD;  Location: [facility];  Service: Gynecology;  Laterality: N/A;   ROBOTIC ASSISTED SUPRACERVICAL HYSTERECTOMY WITH BILATERAL SALPINGO OOPHERECTOMY N/A 03/07/2014   Procedure: ROBOTIC  ASSISTED SUPRACERVICAL HYSTERECTOMY WITH BILATERAL SALPINGO OOPHORECTOMY;  Surgeon: Princess Bruins, MD;  Location: [facility];  Service: Gynecology;  Laterality: N/A;  4 hrs.   TONSILLECTOMY     TOTAL KNEE ARTHROPLASTY Left 12/30/2012   Procedure: TOTAL KNEE ARTHROPLASTY;  Surgeon: Mcarthur Rossetti, MD;  Location: [facility];  Service: Orthopedics;  Laterality: Left;  Left Total Knee Arthroplasty   TUBAL LIGATION     WISDOM TOOTH EXTRACTION      Outpatient Medications Prior to Visit  Medication Sig Dispense Refill   thyroid (NP THYROID) 30 MG tablet TAKE 1 TABLET BY MOUTH EVERY DAY 90 tablet 3   albuterol (VENTOLIN HFA) 108 (90 Base) MCG/ACT inhaler INHALE 2 PUFFS INTO THE LUNGS EVERY 4 HOURS AS NEEDED FOR WHEEZING OR SHORTNESS OF BREATH (Patient not taking: Reported on 09/30/2021) 8.5 g 1   colchicine (COLCRYS) 0.6 MG tablet Take 1 tablet (0.6 mg total) by mouth 2 (two) times daily as needed. (Patient not taking: Reported on 09/30/2021) 60 tablet 3   etodolac (LODINE) 400 MG tablet Take 400 mg by mouth as needed. (Patient not taking: Reported on 09/30/2021)     gabapentin (NEURONTIN) 100 MG capsule Take 1 capsule by mouth as needed. (Patient not taking: Reported on 09/30/2021)     No facility-administered medications prior to visit.    Allergies  Allergen Reactions   Penicillins Anaphylaxis    REACTION: causes tongue to swell Has patient had a PCN reaction causing immediate rash, facial/tongue/throat swelling, SOB or lightheadedness with hypotension: Yes swelling Has patient had a PCN reaction causing severe rash involving mucus membranes or skin necrosis:  Has patient had a PCN reaction that required hospitalization no Has patient had a PCN reaction occurring within the last 10 years: no If all of the above answers are "NO", then may proceed with Cephalosporin use.    Adhesive [Tape] Other (See Comments)    Please use paper tape   Codeine Nausea Only   Lisinopril     REACTION:  wheezing   Other Other (See Comments)    Pt was having knee surgery and was given an epidural, which did not work.   Percocet [Oxycodone-Acetaminophen] Nausea And Vomiting   QRF:XJOITGPQ/[id number] except as noted in HPI      Objective:     BP 133/80    Pulse 71    Temp 98 F (36.7 C) (Temporal)    Ht 5\' 2"  (1.575 m)    Wt 128 lb 2 oz (58.1 kg)    SpO2 95%    BMI 23.43 kg/m  Wt Readings from Last 3 Encounters:  10/31/21 128 lb 2 oz (58.1 kg)  09/30/21 131 lb (59.4 kg)  09/22/21 131 lb 4.8 oz (59.6 kg)        Gen: WDWN NAD HEENT: NCAT, conjunctiva not injected, sclera nonicteric NECK:  supple, no thyromegaly, no nodes, no carotid bruits CARDIAC: RRR, S1S2+, no murmur. DP 2+B LUNGS: CTAB. No wheezes EXT:  no edema MSK: no gross abnormalities. L shoulder-no TTP.  FROM. MS 5/5 BLE. No pain to move arm/shoulder/palp chest.  Although, pressing on shoulder blade "feels good".  No rash.  NEURO: A&O x3.  CN II-XII intact.  PSYCH: normal mood. Good eye contact  EKG-no changes.  Some artifact  Assessment & Plan:   Problem List Items Addressed This Visit   None Visit Diagnoses     Acute pain of left shoulder    -  Primary   Relevant Orders   EKG 12-Lead (Completed)   Pleuritic chest pain       Relevant Orders   EKG 12-Lead (Completed)      L shoulder pain-could be cardiac variant, PE, early shingles(never vaccinated) or shoulder-pain not reproducible and sudden onset.   Does have some CAD on CTA.  Advised to ER for difinative dx as I cannot r/o PE.   Pleuritic CP-some better.  As above.  Suggest ER  No orders of the defined types were placed in this encounter.   Wellington Hampshire., MD

## 2021-11-01 ENCOUNTER — Encounter: Payer: Self-pay | Admitting: Family Medicine

## 2021-11-01 ENCOUNTER — Emergency Department (HOSPITAL_BASED_OUTPATIENT_CLINIC_OR_DEPARTMENT_OTHER): Payer: PPO

## 2021-11-01 ENCOUNTER — Emergency Department (HOSPITAL_BASED_OUTPATIENT_CLINIC_OR_DEPARTMENT_OTHER)
Admission: EM | Admit: 2021-11-01 | Discharge: 2021-11-01 | Disposition: A | Discharge status: Home / Self Care | Payer: PPO | Attending: Emergency Medicine | Admitting: Emergency Medicine

## 2021-11-01 DIAGNOSIS — M25519 Pain in unspecified shoulder: Secondary | ICD-10-CM

## 2021-11-01 DIAGNOSIS — M25512 Pain in left shoulder: Secondary | ICD-10-CM | POA: Diagnosis not present

## 2021-11-01 NOTE — ED Notes (Signed)
Patient assisted to restroom.  Steady gait noted °

## 2021-11-01 NOTE — ED Provider Notes (Signed)
[facility] EMERGENCY DEPT Provider Note   CSN: [id number] Arrival date & time: 10/31/21  1625     History Chief Complaint  Patient presents with   Shoulder Pain    Julie [name] is a 80 y.o. female.  14-year-old female sent here by her PCP to rule out blood clot.  She states she had a cute onset of left thoracic paraspinal pain that hurt worse when she took deep breaths.  She would to see her doctor and because of the acuity of it they thought she might have a blood clot and sent her here.  She has no shortness of breath, lightheadedness, chest pain or radiation of symptoms.  She took ibuprofen and it got better but she was worried about a blood clot so she still here to be evaluated.  No fever or cough.  No recent illnesses.   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   Other bursitis disorders    history   Personal history of colonic polyps    PONV (postoperative nausea and vomiting)    Pre-diabetes    Pure hypercholesterolemia    Spondylolisthesis, cervical region    SVD (spontaneous vaginal delivery)    x 1   Unspecified disorder of thyroid    Unspecified hypothyroidism     Patient Active Problem List   Diagnosis Date Noted   At moderate risk for fall 03/21/2018   Spondylolisthesis of cervicothoracic region 11/11/2017   Depression, recurrent ([place]) 09/20/2017   Lumbar scoliosis 01/22/2017   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   Adjustment disorder with depressed  mood 03/16/2016   Pain in joint, ankle and foot 02/11/2015   Postoperative state 03/07/2014   Preventative health care 10/30/2013   Prediabetes 03/19/2013   Osteoporosis 01/30/2013   Postoperative anemia due to acute blood loss 01/30/2013   Diarrhea 01/30/2013   Pigmented villonodular synovitis of left knee 12/30/2012   Acute meniscal tear of knee 10/21/2012   Chronic insomnia 09/05/2012   Gout 09/05/2012   Right shoulder pain 10/23/2011   Hip pain, left 10/23/2011   REACTIVE AIRWAY DISEASE 10/07/2009   CONSTIPATION 05/14/2009   COLONIC POLYPS, HX OF 05/13/2009   FATIGUE 02/11/2009   DERMATITIS 03/05/2008   Essential hypertension 10/10/2007   FOOT PAIN, RIGHT 10/10/2007   UNSPECIFIED DISORDER OF THYROID 10/03/2007   OTHER ABNORMAL GLUCOSE 10/03/2007   Hypothyroidism 09/17/2007   HYPERCHOLESTEROLEMIA 09/17/2007   Primary osteoarthritis of both hands 09/17/2007   BURSITIS 09/17/2007   BREAST IMPLANTS, BILATERAL, HX OF 09/17/2007   LAMINECTOMY, LUMBAR, HX OF 09/17/2007    Past Surgical History:  Procedure Laterality Date   ABDOMINAL HYSTERECTOMY     ANTERIOR CERVICAL DECOMP/DISCECTOMY FUSION N/A 11/11/2017   Procedure: Cervical seven - Thoracic one  Anterior cervical decompression/discectomy/fusion;  Surgeon: Erline Levine, MD;  Location: [place];  Service: Neurosurgery;  Laterality: N/A;   ANTERIOR LAT LUMBAR FUSION  Left 01/22/2017   Procedure: Left Lumbar three-four, Lumbar four-five  Anterior lateral lumbar interbody fusion;  Surgeon: Erline Levine, MD;  Location: [place];  Service: Neurosurgery;  Laterality: Left;   BACK SURGERY  2002   L4/L5   BLADDER SUSPENSION N/A 03/07/2014   Procedure: TRANSVAGINAL TAPE (TVT) PROCEDURE WITH CYSTO;  Surgeon: Princess Bruins, MD;  Location: [facility];  Service: Gynecology;  Laterality: N/A;  vaginal    blephorplasty      bilateral    BREAST SURGERY     x 2 breast implants, later removed   COLONOSCOPY     CYSTOCELE REPAIR N/A 03/07/2014    Procedure: ANTERIOR REPAIR (CYSTOCELE);  Surgeon: Princess Bruins, MD;  Location: [facility];  Service: Gynecology;  Laterality: N/A;   devaited septum surgery  1975   EYE SURGERY     bilateral cataract surgery   KNEE ARTHROSCOPY  10/21/2012   right - Procedure: ARTHROSCOPY KNEE;  Surgeon: Mcarthur Rossetti, MD;  Location: [facility];  Service: Orthopedics;  Laterality: Left;  Left Knee Arthroscopy, Debridement, partial synovectomy   LUMBAR LAMINECTOMY     LUMBAR PERCUTANEOUS PEDICLE SCREW 2 LEVEL N/A 01/22/2017   Procedure: Lumbar Percutaneous Pedicle Screw Placement Lumbar three-four, Lumbar four-five;  Surgeon: Erline Levine, MD;  Location: [place];  Service: Neurosurgery;  Laterality: N/A;   ROBOTIC ASSISTED LAPAROSCOPIC SACROCOLPOPEXY N/A 03/07/2014   Procedure: ROBOTIC ASSISTED LAPAROSCOPIC SACROCOLPOPEXY With Carrolyn Meiers;  Surgeon: Princess Bruins, MD;  Location: [facility];  Service: Gynecology;  Laterality: N/A;   ROBOTIC ASSISTED SUPRACERVICAL HYSTERECTOMY WITH BILATERAL SALPINGO OOPHERECTOMY N/A 03/07/2014   Procedure: ROBOTIC ASSISTED SUPRACERVICAL HYSTERECTOMY WITH BILATERAL SALPINGO OOPHORECTOMY;  Surgeon: Princess Bruins, MD;  Location: [facility];  Service: Gynecology;  Laterality: N/A;  4 hrs.   TONSILLECTOMY     TOTAL KNEE ARTHROPLASTY Left 12/30/2012   Procedure: TOTAL KNEE ARTHROPLASTY;  Surgeon: Mcarthur Rossetti, MD;  Location: [facility];  Service: Orthopedics;  Laterality: Left;  Left Total Knee Arthroplasty   TUBAL LIGATION     WISDOM TOOTH EXTRACTION       OB History   No obstetric history on file.     Family History  Problem Relation Age of Onset   Cancer Mother        breast and lung   Stroke Father    Heart attack Father    Diabetes Sister    Emphysema Maternal Aunt    Colon cancer Neg Hx     Social History   Tobacco Use   Smoking status: Never   Smokeless tobacco: Never  Vaping Use   Vaping Use: Never used  Substance Use Topics   Alcohol use: No   Drug use: No     Home Medications Prior to Admission medications   Medication Sig Start Date End Date Taking? Authorizing Provider  thyroid (NP THYROID) 30 MG tablet TAKE 1 TABLET BY MOUTH EVERY DAY 12/02/20   Burchette, Alinda Sierras, MD    Allergies    Penicillins, Adhesive [tape], Codeine, Lisinopril, Other, and Percocet [oxycodone-acetaminophen]  Review of Systems   Review of Systems  All other systems reviewed and are negative.  Physical Exam Updated Vital Signs BP (!) 147/91 (BP Location: Right Arm)    Pulse (!) 57    Temp 98.4 F (36.9 C) (Oral)    Resp 14    Ht 5' (1.524 m)    Wt 58.5 kg    SpO2 98%    BMI 25.19 kg/m   Physical Exam Vitals and  nursing note reviewed.  Constitutional:      Appearance: She is well-developed.  HENT:     Head: Normocephalic and atraumatic.     Mouth/Throat:     Mouth: Mucous membranes are moist.     Pharynx: Oropharynx is clear.  Eyes:     Pupils: Pupils are equal, round, and reactive to light.  Cardiovascular:     Rate and Rhythm: Normal rate and regular rhythm.  Pulmonary:     Effort: No respiratory distress.     Breath sounds: No stridor.  Abdominal:     General: There is no distension.  Musculoskeletal:        General: Tenderness present. No swelling. Normal range of motion.     Cervical back: Normal range of motion.  Skin:    General: Skin is warm and dry.  Neurological:     General: No focal deficit present.     Mental Status: She is alert.    ED Results / Procedures / Treatments   Labs (all labs ordered are listed, but only abnormal results are displayed) Labs Reviewed  BASIC METABOLIC PANEL - Abnormal; Notable for the following components:      Result Value   Glucose, Bld 121 (*)    All other components within normal limits  D-DIMER, QUANTITATIVE - Abnormal; Notable for the following components:   D-Dimer, Quant 0.57 (*)    All other components within normal limits  CBC  TROPONIN I (HIGH SENSITIVITY)    EKG EKG  Interpretation  Date/Time:  Friday October 31 2021 17:36:16 EST Ventricular Rate:  65 PR Interval:  224 QRS Duration: 82 QT Interval:  398 QTC Calculation: 413 R Axis:   44 Text Interpretation: Sinus rhythm with 1st degree A-V block Otherwise normal ECG When compared with ECG of 14-Mar-2018 12:26, PR interval has increased Confirmed by Merrily Pew [phone]) on 11/01/2021 12:50:55 AM  Radiology DG Chest Portable 1 View  Result Date: 11/01/2021 CLINICAL DATA:  Left shoulder pain x2 days. EXAM: PORTABLE CHEST 1 VIEW COMPARISON:  Mar 14, 2018 FINDINGS: The lungs are hyperinflated. There is no evidence of acute infiltrate, pleural effusion or pneumothorax. The heart size and mediastinal contours are within normal limits. There is marked severity calcification of the thoracic aorta. A radiopaque fusion plate and screws are seen overlying the lower cervical spine. Multilevel degenerative changes are seen throughout the thoracic spine. IMPRESSION: No active cardiopulmonary disease. Electronically Signed   By: Virgina Norfolk M.D.   On: 11/01/2021 03:42    Procedures Procedures   Medications Ordered in ED Medications - No data to display  ED Course  I have reviewed the triage vital signs and the nursing notes.  Pertinent labs & imaging results that were available during my care of the patient were reviewed by me and considered in my medical decision making (see chart for details).  Clinical Course as of 11/01/21 2350  Sat Nov 01, 2021  0153 BP(!): 145/71 [JM]  0154 BP(!): 148/72 [JM]  0154 BP(!): 157/77 [JM]  0154 BP(!): 189/82 [JM]  0154 SpO2: 97 % [JM]  0154 Resp: 16 [JM]  0154 Pulse Rate: 64 [JM]  0154 Temp: 98.4 F (36.9 C) [JM]  0154 D-Dimer, Quant(!): 0.57 Slightly high, but when age adjusted is actually quite reassuring.  [JM]    Clinical Course User Index [JM] Grahm Etsitty, Corene Cornea, MD   MDM Rules/Calculators/A&P  I discussed the patient's lab results  with her and the fact that I thought was quite unlikely that she had a pulmonary embolus.  I still offered her CT scan since her doctor told her that she needed 1.  She understood biologic in why I did not think it was a blood clot and understood that there was a small chance that I could be incorrect.  Went and got a chest x-ray just to ensure there was no bony abnormalities or lung abnormalities and so is reassuring.  Patient stable for discharge with PCP follow-up as needed.   Final Clinical Impression(s) / ED Diagnoses Final diagnoses:  None    Rx / DC Orders ED Discharge Orders     None        Mariska Daffin, Corene Cornea, MD 11/01/21 2350

## 2021-11-07 ENCOUNTER — Telehealth: Payer: Self-pay | Admitting: Urology

## 2021-11-07 NOTE — Telephone Encounter (Signed)
DOS - 11/25/21  HAMMERTOE REPAIR 2,3 RIGHT --- [id number]   [facility] EFFECTIVE DATE - 10/02/04   RECEIVED FAX FROM [facility] STATING THAT CPT CODE 03474 HAS BEEN APPROVED, AUTH # [id number], GOOD FROM 11/25/21 - 02/23/22.

## 2021-11-10 ENCOUNTER — Ambulatory Visit: Payer: PPO | Admitting: Podiatry

## 2021-11-10 ENCOUNTER — Other Ambulatory Visit: Payer: Self-pay

## 2021-11-10 ENCOUNTER — Encounter: Payer: Self-pay | Admitting: Podiatry

## 2021-11-10 DIAGNOSIS — M2041 Other hammer toe(s) (acquired), right foot: Secondary | ICD-10-CM

## 2021-11-10 DIAGNOSIS — M2042 Other hammer toe(s) (acquired), left foot: Secondary | ICD-10-CM

## 2021-11-10 NOTE — Progress Notes (Signed)
Subjective:   Patient ID: Julie [name], female   DOB: 81 y.o.   MRN: [id number]   HPI Patient presents stating that I am doing well and I am due for surgery and I had some improvement for a few weeks after treatments   ROS      Objective:  Physical Exam  Neurovascular status intact with patient found to have hammertoe deformity digits 2 and 3 right foot with distal contracture digit 3.  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  Patient is encouraged to call questions concerns does understand everything as listed and understands total recovery.  Will take 3 to 6 months and will be an open toed shoes for approximately 4 weeks.  Patient scheduled outpatient surgery encouraged to call questions concerns

## 2021-11-24 MED ORDER — HYDROCODONE-ACETAMINOPHEN 10-325 MG PO TABS: 1.0000 | ORAL_TABLET | Freq: Three times a day (TID) | ORAL | 0 refills | Status: AC | PRN

## 2021-11-24 NOTE — Addendum Note (Signed)
Addended by: Wallene Huh on: 11/24/2021 01:27 PM   Modules accepted: Orders

## 2021-11-25 ENCOUNTER — Encounter: Payer: Self-pay | Admitting: Podiatry

## 2021-11-25 DIAGNOSIS — M2041 Other hammer toe(s) (acquired), right foot: Secondary | ICD-10-CM | POA: Diagnosis not present

## 2021-11-27 ENCOUNTER — Encounter: Payer: Self-pay | Admitting: Podiatry

## 2021-11-27 ENCOUNTER — Other Ambulatory Visit: Payer: Self-pay | Admitting: Podiatry

## 2021-11-27 MED ORDER — ONDANSETRON HCL 4 MG PO TABS: 4.0000 mg | ORAL_TABLET | Freq: Three times a day (TID) | ORAL | 0 refills | Status: DC | PRN

## 2021-11-27 NOTE — Progress Notes (Signed)
An

## 2021-12-01 ENCOUNTER — Telehealth: Payer: Self-pay | Admitting: *Deleted

## 2021-12-01 ENCOUNTER — Ambulatory Visit (INDEPENDENT_AMBULATORY_CARE_PROVIDER_SITE_OTHER): Payer: PPO | Admitting: Podiatry

## 2021-12-01 ENCOUNTER — Encounter: Payer: Self-pay | Admitting: Podiatry

## 2021-12-01 ENCOUNTER — Ambulatory Visit (INDEPENDENT_AMBULATORY_CARE_PROVIDER_SITE_OTHER): Payer: PPO

## 2021-12-01 ENCOUNTER — Other Ambulatory Visit: Payer: Self-pay

## 2021-12-01 DIAGNOSIS — Z9889 Other specified postprocedural states: Secondary | ICD-10-CM

## 2021-12-01 DIAGNOSIS — M2041 Other hammer toe(s) (acquired), right foot: Secondary | ICD-10-CM

## 2021-12-02 NOTE — Progress Notes (Signed)
Subjective:   Patient ID: Julie [name], female   DOB: 81 y.o.   MRN: [id number]   HPI Patient states she is very happy with surgery doing well   ROS      Objective:  Physical Exam  Neurovascular status intact negative Bevelyn Buckles' sign noted digits 2 and 3 healing well wound edges well coapted alignment good today     Assessment:  Doing well post surgery right foot     Plan:  Reviewed x-ray sterile dressing reapplied and instructed on continued immobilization elevation compression and reappoint to recheck  X-rays indicate alignment looks good structure looks good of the digits

## 2021-12-08 ENCOUNTER — Other Ambulatory Visit: Payer: Self-pay | Admitting: Family Medicine

## 2021-12-15 ENCOUNTER — Other Ambulatory Visit: Payer: Self-pay

## 2021-12-15 ENCOUNTER — Encounter: Payer: Self-pay | Admitting: Podiatry

## 2021-12-15 ENCOUNTER — Ambulatory Visit (INDEPENDENT_AMBULATORY_CARE_PROVIDER_SITE_OTHER): Payer: PPO

## 2021-12-15 ENCOUNTER — Ambulatory Visit (INDEPENDENT_AMBULATORY_CARE_PROVIDER_SITE_OTHER): Payer: PPO | Admitting: Podiatry

## 2021-12-15 DIAGNOSIS — Z9889 Other specified postprocedural states: Secondary | ICD-10-CM

## 2021-12-17 NOTE — Progress Notes (Signed)
Subjective:   Patient ID: Julie [name], female   DOB: 81 y.o.   MRN: [id number]   HPI Patient states overall doing well with surgery with mild swelling   ROS      Objective:  Physical Exam  Neurovascular status intact negative Bevelyn Buckles' sign noted incision sites healing well wound edges coapted well mild swelling third digit     Assessment:  Digital swelling second and third digits right postop normal for this.  With stitches intact wound edges well coapted     Plan:  Stitches removed x-rays reviewed swelling should reduce explain wider shoes and still open toed shoes reappoint for [place] to recheck  X-rays indicate satisfactory resection of bone good alignment noted

## 2021-12-22 ENCOUNTER — Other Ambulatory Visit: Payer: PPO

## 2021-12-23 DIAGNOSIS — D485 Neoplasm of uncertain behavior of skin: Secondary | ICD-10-CM | POA: Diagnosis not present

## 2021-12-23 DIAGNOSIS — L723 Sebaceous cyst: Secondary | ICD-10-CM | POA: Diagnosis not present

## 2021-12-23 DIAGNOSIS — Z85828 Personal history of other malignant neoplasm of skin: Secondary | ICD-10-CM | POA: Diagnosis not present

## 2021-12-23 DIAGNOSIS — C44319 Basal cell carcinoma of skin of other parts of face: Secondary | ICD-10-CM | POA: Diagnosis not present

## 2021-12-23 DIAGNOSIS — D1801 Hemangioma of skin and subcutaneous tissue: Secondary | ICD-10-CM | POA: Diagnosis not present

## 2021-12-23 DIAGNOSIS — L821 Other seborrheic keratosis: Secondary | ICD-10-CM | POA: Diagnosis not present

## 2021-12-23 DIAGNOSIS — L570 Actinic keratosis: Secondary | ICD-10-CM | POA: Diagnosis not present

## 2021-12-26 ENCOUNTER — Telehealth: Payer: Self-pay | Admitting: Pharmacist

## 2021-12-26 NOTE — Chronic Care Management (AMB) (Signed)
Chronic Care Management Pharmacy Assistant   Name: Julie [name]  MRN: [id number] DOB: 1941-08-04  Reason for Encounter: Disease State / Hypertension Assessment Call   Conditions to be addressed/monitored: HTN  Recent office visits:  10/31/2021 Tawnya Crook MD (family med) - Patient was seen acute pain of left shoulder and an additional issues. No medication changes. No follow up noted.   09/30/2021 Glenna Durand LPN, medicare annual wellness exam.  09/22/2021 Carolann Littler MD - Patient was seen for prediabetes. Discontinued Ascobic acid, Bilberry, Biotin, Calcium, Vit. D, Vit B12, Tumeric, MVI, Trazodone, Vit E and Zinc. Follow up in 6 months.  Recent consult visits:  12/15/2021 Ila Mcgill DPM (Podiatry) - Patient was seen for post operative state. No medication changes. No follow up needed.   12/01/2021 Ila Mcgill DPM (Podiatry) - Patient was seen for post operative state. No medication changes.Follow up in 2 weeks.   11/10/2021 Ila Mcgill DPM (Podiatry) - Patient was seen for Hammer toes of both feet. Started Norco 10/325 mg 1 tablet every 8 hours prn. No follow up noted.   10/13/2021 Ila Mcgill DPM (Podiatry) - Patient was seen for Hammer toes of both feet. No medication changes. No follow up noted.  Hospital visits: Patient was seen at [facility] on 11/01/2021 (2 hours) due to Acute shoulder pain, unspecified laterality.  New?Medications Started at [facility] Discharge:?? No new medications started Medication Changes at Hospital Discharge: No medication changes. Medications Discontinued at Hospital Discharge: Albuterol  Colchicine Etodolac Gabapentin Medications that remain the same after Hospital Discharge:??  -All other medications will remain the same.    Medications: Outpatient Encounter Medications as of 12/26/2021  Medication Sig   ondansetron (ZOFRAN) 4 MG tablet Take 1 tablet (4 mg total) by mouth every 8 (eight) hours as  needed for nausea or vomiting.   thyroid (NP THYROID) 30 MG tablet TAKE 1 TABLET BY MOUTH EVERY DAY   No facility-administered encounter medications on file as of 12/26/2021.  Fill History: ONDANSETRON 4MG  TABLETS 11/27/2021 6   THYROID NP 0.5GR (30MG ) TABLETS 12/08/2021 90   Reviewed chart prior to disease state call. Spoke with patient regarding BP  Recent Office Vitals: BP Readings from Last 3 Encounters:  11/01/21 (!) 147/91  10/31/21 133/80  09/22/21 132/78   Pulse Readings from Last 3 Encounters:  11/01/21 (!) 57  10/31/21 71  09/22/21 66    Wt Readings from Last 3 Encounters:  10/31/21 129 lb (58.5 kg)  10/31/21 128 lb 2 oz (58.1 kg)  09/30/21 131 lb (59.4 kg)     Kidney Function Lab Results  Component Value Date/Time   CREATININE 0.49 10/31/2021 05:45 PM   CREATININE 0.62 09/02/2020 08:56 AM   CREATININE 0.63 07/29/2011 09:44 AM   GFR 107.34 02/10/2016 10:05 AM   GFRNONAA >60 10/31/2021 05:45 PM   GFRNONAA >60 07/29/2011 09:44 AM   GFRAA 98 09/02/2020 08:56 AM   GFRAA >60 07/29/2011 09:44 AM    BMP Latest Ref Rng & Units 10/31/2021 09/02/2020 03/14/2018  Glucose 70 - 99 mg/dL 121(H) 104(H) 100(H)  BUN 8 - 23 mg/dL 18 12 11   Creatinine 0.44 - 1.00 mg/dL 0.49 0.62 0.55  BUN/Creat Ratio 12 - 28 - 19 -  Sodium 135 - 145 mmol/L 139 143 140  Potassium 3.5 - 5.1 mmol/L 3.8 3.6 4.1  Chloride 98 - 111 mmol/L 106 105 107  CO2 22 - 32 mmol/L 25 24 24   Calcium 8.9 - 10.3 mg/dL 9.1  9.5 9.1   Current antihypertensive regimen:  No current medications  How often are you checking your Blood Pressure? Patient states she is not checking at home.   Current home BP readings: Patient states she doesn't have any current readings and her blood pressure is only high when she see's the doctor.   What recent interventions/DTPs have been made by any provider to improve Blood Pressure control since last CPP Visit: No recent interventions.  Any recent hospitalizations or ED visits  since last visit with CPP? No recent hospital visits.   What diet changes have been made to improve Blood Pressure Control?  Patient is eating a low carb diet Breakfast - Patient will have yogurt with berries and sausage Lunch - Patient eats fish on most days Dinner - Patient will have a meat (variety) and a salad Snacks - Patient will snack on fruits and nuts.  What exercise is being done to improve your Blood Pressure Control?  Patient walks daily and works three days per week   Adherence Review: Is the patient currently on ACE/ARB medication? No Does the patient have >5 day gap between last estimated fill dates? No  Care Gaps: AWV - scheduled 10/06/2022 Last BP - 133/80 on 10/31/2021 Foot exam - never done Malb - never done Shingrix - never done Covid booster - overdue  Star Rating Drugs: None  [place] Pharmacist Assistant [phone]

## 2022-01-03 DIAGNOSIS — R109 Unspecified abdominal pain: Secondary | ICD-10-CM | POA: Diagnosis not present

## 2022-01-03 DIAGNOSIS — M549 Dorsalgia, unspecified: Secondary | ICD-10-CM | POA: Diagnosis not present

## 2022-01-06 ENCOUNTER — Ambulatory Visit (INDEPENDENT_AMBULATORY_CARE_PROVIDER_SITE_OTHER): Payer: PPO | Admitting: Family Medicine

## 2022-01-06 ENCOUNTER — Encounter: Payer: Self-pay | Admitting: Family Medicine

## 2022-01-06 VITALS — BP 150/86 | HR 61 | Temp 97.4°F | Ht 60.0 in | Wt 127.1 lb

## 2022-01-06 DIAGNOSIS — D179 Benign lipomatous neoplasm, unspecified: Secondary | ICD-10-CM | POA: Diagnosis not present

## 2022-01-06 DIAGNOSIS — M546 Pain in thoracic spine: Secondary | ICD-10-CM

## 2022-01-06 NOTE — Progress Notes (Signed)
Established Patient Office Visit  Subjective:  Patient ID: Julie [name], female    DOB: Feb 13, 1941  Age: 81 y.o. MRN: [id number]  CC:  Chief Complaint  Patient presents with   Hospitalization Follow-up   Back Pain    Patient complains of back pain, x5 days, Tried Meloxicam with little relief     HPI Julie [name] presents for recent back pain episodes.  She actually back in December had acute mostly left-sided thoracic back pain.  She went to primary care initially and they were concerned about possible pulmonary embolus.  She was subsequently referred to the ER.  D-dimer there 0.57.  No further imaging was done at that time other than chest x-ray which showed no acute abnormality.  Last Thursday she developed some acute pain in her back around the right flank region.  She went to local urgent care here in [place].  Had chest x-ray, rib films, and lumbar films which showed no acute abnormality.  She has had multiple back surgeries.  Her pain is around the right flank region.  No dysuria.  No fevers or chills.  Worse with movement.  No abdominal pain.  No recent skin rashes.  She did burn 1 area right lower thoracic area where she applied a topical sports cream followed by heating pad.  She had a small blister that ruptured.  She recently had shot of Toradol and was prescribed meloxicam 7.5 mg daily.  She states her pain is about 50% better.  No radiculitis symptoms.  She has some fatty mobile subcutaneous masses in her right lower thoracic region.  She does notice these recently on exam.  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   Other bursitis disorders    history   Personal history of colonic polyps    PONV (postoperative nausea and vomiting)    Pre-diabetes    Pure hypercholesterolemia    Spondylolisthesis, cervical region    SVD (spontaneous vaginal delivery)    x 1   Unspecified disorder of thyroid    Unspecified hypothyroidism     Past Surgical History:  Procedure Laterality Date   ABDOMINAL HYSTERECTOMY     ANTERIOR CERVICAL DECOMP/DISCECTOMY FUSION N/A 11/11/2017   Procedure: Cervical seven - Thoracic one  Anterior cervical decompression/discectomy/fusion;  Surgeon: Erline Levine, MD;  Location: [facility];  Service: Neurosurgery;  Laterality: N/A;   ANTERIOR LAT LUMBAR FUSION Left 01/22/2017   Procedure: Left Lumbar three-four, Lumbar four-five  Anterior lateral lumbar interbody fusion;  Surgeon: Erline Levine, MD;  Location: [facility];  Service: Neurosurgery;  Laterality: Left;   BACK SURGERY  2002   L4/L5   BLADDER SUSPENSION N/A 03/07/2014   Procedure: TRANSVAGINAL TAPE (TVT) PROCEDURE WITH CYSTO;  Surgeon:  Princess Bruins, MD;  Location: [facility];  Service: Gynecology;  Laterality: N/A;  vaginal    blephorplasty      bilateral    BREAST SURGERY     x 2 breast implants, later removed   COLONOSCOPY     CYSTOCELE REPAIR N/A 03/07/2014   Procedure: ANTERIOR REPAIR (CYSTOCELE);  Surgeon: Princess Bruins, MD;  Location: [facility];  Service: Gynecology;  Laterality: N/A;   devaited septum surgery  1975   EYE SURGERY     bilateral cataract surgery   KNEE ARTHROSCOPY  10/21/2012   right - Procedure: ARTHROSCOPY KNEE;  Surgeon: Mcarthur Rossetti, MD;  Location: [facility];  Service: Orthopedics;  Laterality: Left;  Left Knee Arthroscopy, Debridement,  partial synovectomy   LUMBAR LAMINECTOMY     LUMBAR PERCUTANEOUS PEDICLE SCREW 2 LEVEL N/A 01/22/2017   Procedure: Lumbar Percutaneous Pedicle Screw Placement Lumbar three-four, Lumbar four-five;  Surgeon: Erline Levine, MD;  Location: [facility];  Service: Neurosurgery;  Laterality: N/A;   ROBOTIC ASSISTED LAPAROSCOPIC SACROCOLPOPEXY N/A 03/07/2014   Procedure: ROBOTIC ASSISTED LAPAROSCOPIC SACROCOLPOPEXY With Carrolyn Meiers;  Surgeon: Princess Bruins, MD;  Location: [facility];  Service: Gynecology;  Laterality: N/A;   ROBOTIC ASSISTED SUPRACERVICAL HYSTERECTOMY WITH BILATERAL SALPINGO OOPHERECTOMY N/A 03/07/2014   Procedure: ROBOTIC ASSISTED SUPRACERVICAL HYSTERECTOMY WITH BILATERAL SALPINGO OOPHORECTOMY;  Surgeon: Princess Bruins, MD;  Location: [facility];  Service: Gynecology;  Laterality: N/A;  4 hrs.   TONSILLECTOMY     TOTAL KNEE ARTHROPLASTY Left 12/30/2012   Procedure: TOTAL KNEE ARTHROPLASTY;  Surgeon: Mcarthur Rossetti, MD;  Location: [facility];  Service: Orthopedics;  Laterality: Left;  Left Total Knee Arthroplasty   TUBAL LIGATION     WISDOM TOOTH EXTRACTION      Family History  Problem Relation Age of Onset   Cancer Mother        breast and lung   Stroke Father    Heart attack Father    Diabetes Sister    Emphysema Maternal Aunt    Colon cancer Neg Hx     Social History   Socioeconomic History   Marital status: Married    Spouse name: Not on file   Number of children: Not on file   Years of education: Not on file   Highest education level: GED or equivalent  Occupational History   Not on file  Tobacco Use   Smoking status: Never   Smokeless tobacco: Never  Vaping Use   Vaping Use: Never used  Substance and Sexual Activity   Alcohol use: No   Drug use: No   Sexual activity: Yes    Birth control/protection: Post-menopausal  Other Topics Concern   Not on file  Social History Narrative   Not on file   Social Determinants of Health   Financial Resource Strain: Low Risk     Difficulty of Paying Living Expenses: Not hard at all  Food Insecurity: No Food Insecurity   Worried About Charity fundraiser in the Last Year: Never true   [place] in the Last Year: Never true  Transportation Needs: No Transportation Needs   Lack of Transportation (Medical): No   Lack of Transportation (Non-Medical): No  Physical Activity: Sufficiently Active   Days of Exercise per Week: 3 days   Minutes of Exercise per Session: 60 min  Stress: No Stress Concern Present   Feeling of Stress : Not at all  Social Connections: Unknown   Frequency of Communication with Friends and Family: More than  three times a week   Frequency of Social Gatherings with Friends and Family: More than three times a week   Attends Religious Services: Patient refused   Active Member of Clubs or Organizations: Yes   Attends Archivist Meetings: 1 to 4 times per year   Marital Status: Married  Human resources officer Violence: Not on file    Outpatient Medications Prior to Visit  Medication Sig Dispense Refill   thyroid (NP THYROID) 30 MG tablet TAKE 1 TABLET BY MOUTH EVERY DAY 90 tablet 3   ondansetron (ZOFRAN) 4 MG tablet Take 1 tablet (4 mg total) by mouth every 8 (eight) hours as needed for nausea or vomiting. 20 tablet 0   No facility-administered medications prior to visit.    Allergies  Allergen Reactions   Penicillins Anaphylaxis    REACTION: causes tongue to swell Has patient had a PCN reaction causing immediate rash, facial/tongue/throat swelling, SOB or lightheadedness with hypotension: Yes swelling Has patient had a PCN reaction causing severe rash involving mucus membranes or skin necrosis:  Has patient had a PCN reaction that required hospitalization no Has patient had a PCN reaction occurring within the last 10 years: no If all of the above answers are "NO", then may proceed with Cephalosporin use.    Adhesive [Tape] Other (See Comments)    Please use paper tape   Codeine  Nausea Only   Lisinopril     REACTION: wheezing   Other Other (See Comments)    Pt was having knee surgery and was given an epidural, which did not work.   Percocet [Oxycodone-Acetaminophen] Nausea And Vomiting    ROS Review of Systems  Constitutional:  Negative for chills, fever and unexpected weight change.  Respiratory:  Negative for cough and shortness of breath.   Cardiovascular:  Negative for chest pain.  Genitourinary:  Positive for flank pain. Negative for dysuria and hematuria.  Musculoskeletal:  Positive for back pain.     Objective:    Physical Exam Vitals reviewed.  Constitutional:      Appearance: Normal appearance.  Cardiovascular:     Rate and Rhythm: Normal rate.  Pulmonary:     Effort: Pulmonary effort is normal.     Breath sounds: Normal breath sounds.  Musculoskeletal:     Comments: No spinal tenderness.  She does have some mobile nontender fatty consistency masses which are about 2 cm diameter right lower thoracic region.  She has a couple of these that seem to be consistent with lipomas.  These are well rounded and mobile  Skin:    Comments: She has a small oval-shaped eschar about 1/2 to 1 cm right lower thoracic area.  No surrounding erythema.  Neurological:     Mental Status: She is alert.    BP (!) 150/86 (BP Location: Left Arm, Patient Position: Sitting, Cuff Size: Normal)    Pulse 61    Temp (!) 97.4 F (36.3 C) (Oral)    Ht 5' (1.524 m)    Wt 127 lb 1.6 oz (57.7 kg)    SpO2 97%    BMI 24.82 kg/m  Wt Readings from Last 3 Encounters:  01/06/22 127 lb 1.6 oz (57.7 kg)  10/31/21 129 lb (58.5 kg)  10/31/21 128 lb 2 oz (58.1 kg)     Health Maintenance Due  Topic Date Due   FOOT EXAM  Never done   URINE MICROALBUMIN  Never done   Zoster Vaccines- Shingrix (1 of 2) Never done   COVID-19 Vaccine (  3 - Booster for [employer] series) 02/27/2020    There are no preventive care reminders to display for this patient.  Lab Results  Component Value Date    TSH 2.44 06/11/2021   Lab Results  Component Value Date   WBC 7.8 10/31/2021   HGB 12.1 10/31/2021   HCT 37.0 10/31/2021   MCV 92.0 10/31/2021   PLT 232 10/31/2021   Lab Results  Component Value Date   NA 139 10/31/2021   K 3.8 10/31/2021   CO2 25 10/31/2021   GLUCOSE 121 (H) 10/31/2021   BUN 18 10/31/2021   CREATININE 0.49 10/31/2021   BILITOT 0.5 09/02/2020   ALKPHOS 73 09/02/2020   AST 17 09/02/2020   ALT 14 09/02/2020   PROT 6.2 09/02/2020   ALBUMIN 4.2 09/02/2020   CALCIUM 9.1 10/31/2021   ANIONGAP 8 10/31/2021   GFR 107.34 02/10/2016   Lab Results  Component Value Date   CHOL 205 (H) 09/02/2020   Lab Results  Component Value Date   HDL 49 09/02/2020   Lab Results  Component Value Date   LDLCALC 142 (H) 09/02/2020   Lab Results  Component Value Date   TRIG 76 09/02/2020   Lab Results  Component Value Date   CHOLHDL 4.2 09/02/2020   Lab Results  Component Value Date   HGBA1C 5.6 09/22/2021      Assessment & Plan:   #1 right lower thoracic back pain.  Suspect musculoskeletal.  She states she is improved following Toradol injection and meloxicam.  She does have some mild pain which is worse with movement.  Clinically, doubt kidney stones.  Recent imaging reportedly unremarkable  #2 benign appearing lipomatous masses right lower thoracic region.  Reassurance given.  We spent the minutes reviewing her recent ER/urgent care visits, imaging, labs, and discussing and evaluating current symptoms  No orders of the defined types were placed in this encounter.   Follow-up: No follow-ups on file.    Carolann Littler, MD

## 2022-01-06 NOTE — Telephone Encounter (Signed)
completed

## 2022-01-20 DIAGNOSIS — Z85828 Personal history of other malignant neoplasm of skin: Secondary | ICD-10-CM | POA: Diagnosis not present

## 2022-01-20 DIAGNOSIS — C44319 Basal cell carcinoma of skin of other parts of face: Secondary | ICD-10-CM | POA: Diagnosis not present

## 2022-02-25 IMAGING — DX DG CHEST 1V PORT
1 series · 1 of 1 positions shown · non-contrast
Comparison: March 14, 2018

CLINICAL DATA: Left shoulder pain x2 days.

EXAM:
PORTABLE CHEST 1 VIEW

[chest]
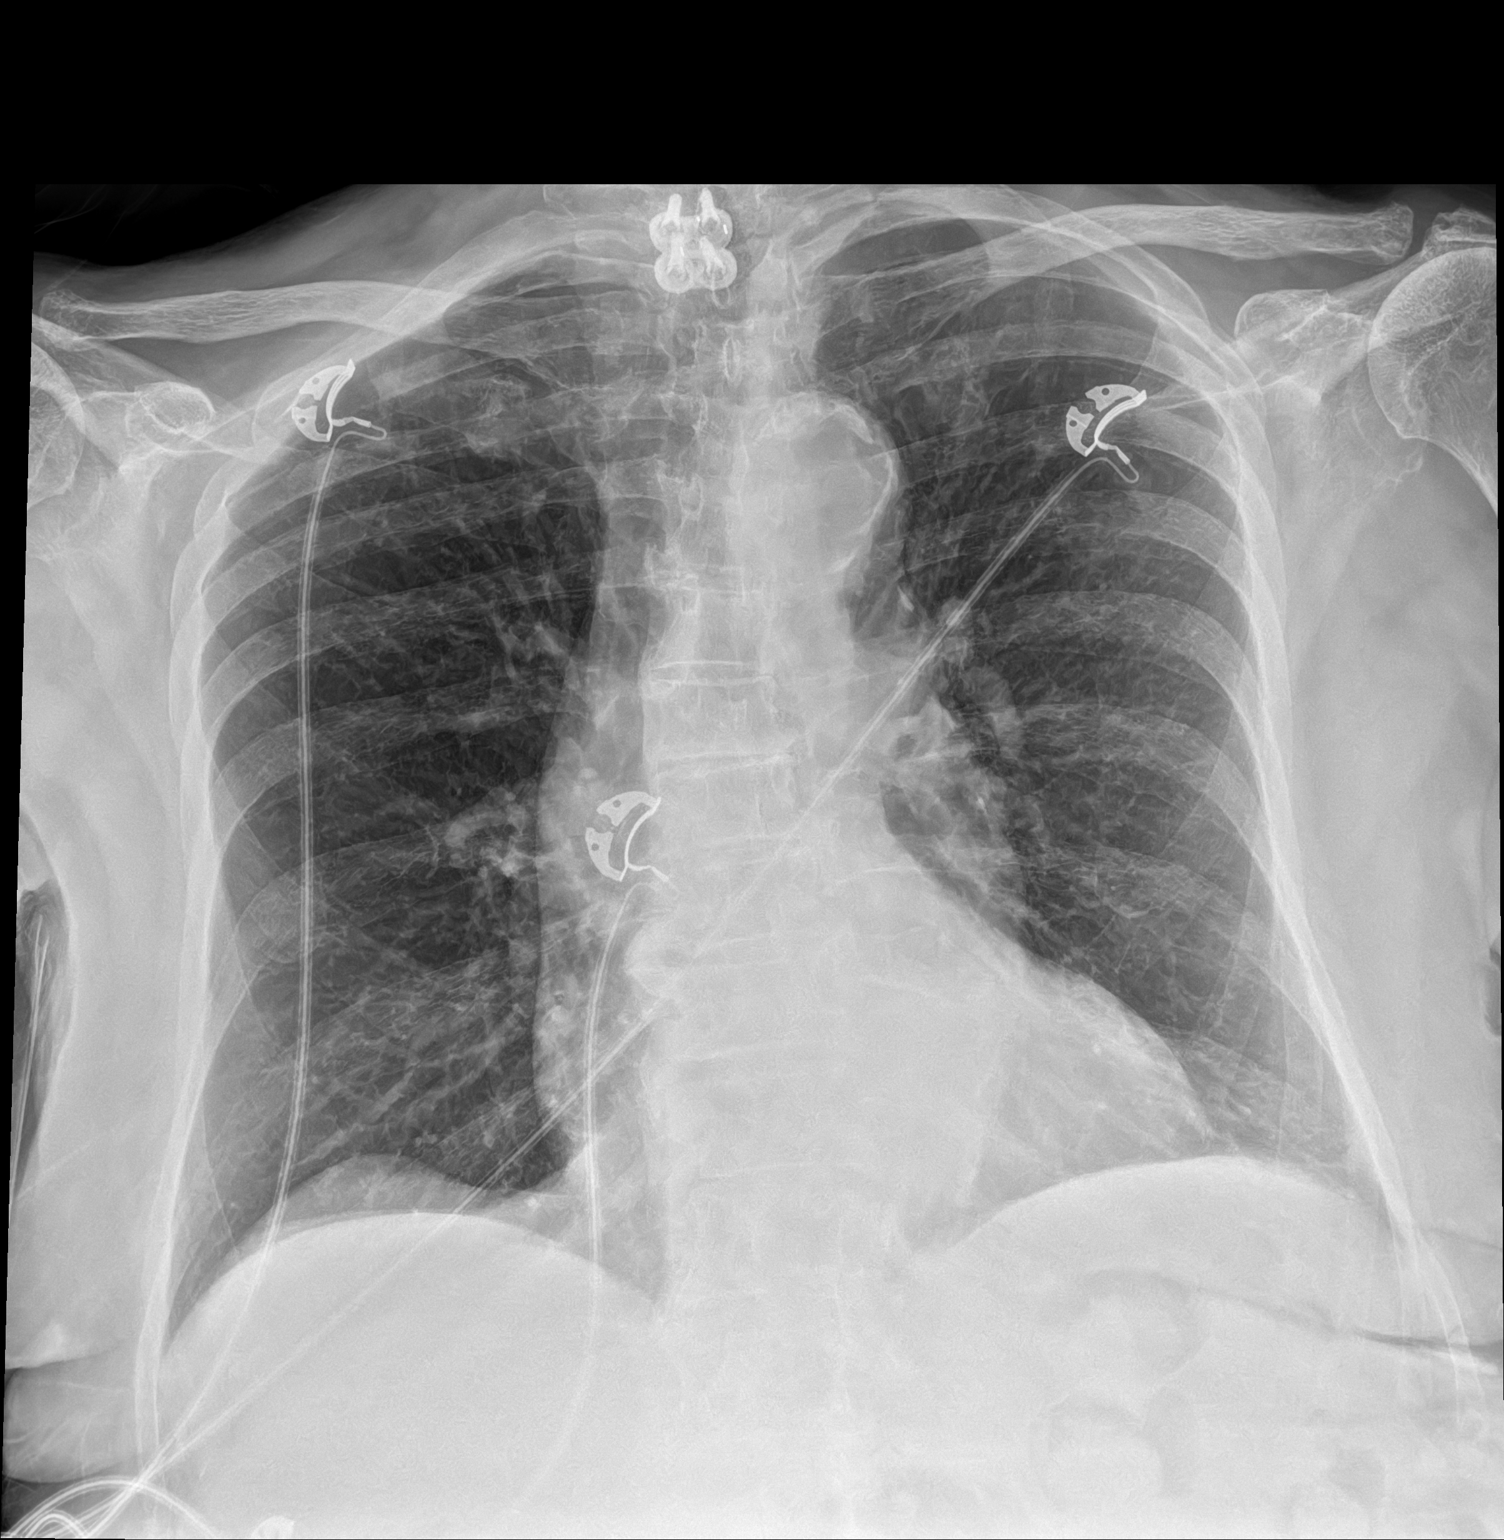

[1 of 1 positions shown; findings below may reference images not displayed]

FINDINGS: The lungs are hyperinflated. There is no evidence of acute
infiltrate, pleural effusion or pneumothorax. The heart size and
mediastinal contours are within normal limits. There is marked
severity calcification of the thoracic aorta. A radiopaque fusion
plate and screws are seen overlying the lower cervical spine.
Multilevel degenerative changes are seen throughout the thoracic
spine.
IMPRESSION: No active cardiopulmonary disease.

## 2022-04-23 ENCOUNTER — Telehealth: Payer: Self-pay | Admitting: *Deleted

## 2022-04-23 DIAGNOSIS — H16201 Unspecified keratoconjunctivitis, right eye: Secondary | ICD-10-CM | POA: Diagnosis not present

## 2022-04-23 DIAGNOSIS — H0100A Unspecified blepharitis right eye, upper and lower eyelids: Secondary | ICD-10-CM | POA: Diagnosis not present

## 2022-04-23 DIAGNOSIS — H0100B Unspecified blepharitis left eye, upper and lower eyelids: Secondary | ICD-10-CM | POA: Diagnosis not present

## 2022-04-23 NOTE — Chronic Care Management (AMB) (Signed)
  Chronic Care Management Note  04/23/2022 Name: TULA SCHRYVER MRN: [id number] DOB: 1940-11-07  CHERISSA HOOK is a 81 y.o. year old female who is a primary care patient of Eulas Post, MD and is actively engaged with the care management team. I reached out to [employer] by phone today to assist with re-scheduling a follow up visit with the Pharmacist  Follow up plan: Unsuccessful telephone outreach attempt made. A HIPAA compliant phone message was left for the patient providing contact information and requesting a return call.   Julian Hy, [facility]  Direct Dial: [phone]

## 2022-04-27 ENCOUNTER — Telehealth: Payer: PPO

## 2022-04-28 ENCOUNTER — Telehealth: Payer: Self-pay | Admitting: Pharmacist

## 2022-04-28 NOTE — Chronic Care Management (AMB) (Signed)
    Chronic Care Management Pharmacy Assistant   Name: Julie [name]  MRN: [id number] DOB: September 23, 1941  Reason for Encounter: Reschedule appointment  Patient canceled 04/27/2022 appointment due to being out of town. Patient declined rescheduling at this time as she is still out of town for a few more day. She states she will contact us when she is back in town to reschedule.    [place] Pharmacist Assistant [phone]

## 2022-05-09 ENCOUNTER — Emergency Department (HOSPITAL_COMMUNITY)
Admission: EM | Admit: 2022-05-09 | Discharge: 2022-05-09 | Disposition: A | Discharge status: Home / Self Care | Payer: PPO | Attending: Emergency Medicine | Admitting: Emergency Medicine

## 2022-05-09 ENCOUNTER — Other Ambulatory Visit: Payer: Self-pay

## 2022-05-09 ENCOUNTER — Emergency Department (HOSPITAL_COMMUNITY): Payer: PPO

## 2022-05-09 ENCOUNTER — Encounter (HOSPITAL_COMMUNITY): Payer: Self-pay

## 2022-05-09 DIAGNOSIS — S60372A Other superficial bite of left thumb, initial encounter: Secondary | ICD-10-CM | POA: Diagnosis not present

## 2022-05-09 DIAGNOSIS — S61452A Open bite of left hand, initial encounter: Secondary | ICD-10-CM | POA: Diagnosis not present

## 2022-05-09 DIAGNOSIS — E039 Hypothyroidism, unspecified: Secondary | ICD-10-CM | POA: Diagnosis not present

## 2022-05-09 DIAGNOSIS — Z23 Encounter for immunization: Secondary | ICD-10-CM | POA: Diagnosis not present

## 2022-05-09 DIAGNOSIS — W5321XA Bitten by squirrel, initial encounter: Secondary | ICD-10-CM | POA: Insufficient documentation

## 2022-05-09 DIAGNOSIS — I10 Essential (primary) hypertension: Secondary | ICD-10-CM | POA: Insufficient documentation

## 2022-05-09 DIAGNOSIS — S61052A Open bite of left thumb without damage to nail, initial encounter: Secondary | ICD-10-CM | POA: Diagnosis not present

## 2022-05-09 DIAGNOSIS — M154 Erosive (osteo)arthritis: Secondary | ICD-10-CM | POA: Diagnosis not present

## 2022-05-09 MED ORDER — DOXYCYCLINE HYCLATE 100 MG PO CAPS: 100.0000 mg | ORAL_CAPSULE | Freq: Two times a day (BID) | ORAL | 0 refills | Status: AC

## 2022-05-09 MED ORDER — TETANUS-DIPHTH-ACELL PERTUSSIS 5-2.5-18.5 LF-MCG/0.5 IM SUSY
0.5000 mL | PREFILLED_SYRINGE | Freq: Once | INTRAMUSCULAR | Status: AC
  Administered 2022-05-09: 0.5 mL via INTRAMUSCULAR
  Filled 2022-05-09: qty 0.5

## 2022-05-09 MED ORDER — CLINDAMYCIN HCL 150 MG PO CAPS: 300.0000 mg | ORAL_CAPSULE | Freq: Three times a day (TID) | ORAL | 0 refills | Status: AC

## 2022-05-09 NOTE — ED Triage Notes (Signed)
Ambulatory to ED after being bit on her L hand by a squirrel that her cat brought in.

## 2022-05-09 NOTE — Discharge Instructions (Signed)
Prescriptions for antibiotics were sent to your pharmacy.  Take as prescribed.  Take Tylenol as needed for pain.  Your area of pain and swelling should improve.  Return to the emergency department if symptoms worsen despite antibiotics.

## 2022-05-09 NOTE — ED Provider Notes (Signed)
[facility] DEPT Provider Note   CSN: [id number] Arrival date & time: 05/09/22  0449     History  Chief Complaint  Patient presents with   Animal Bite    Julie [name] is a 81 y.o. female.   Animal Bite  Patient presents for animal bite.  Medical history includes hypothyroidism, HTN, HLD, gout, osteoporosis, prediabetes, depression, GERD.  At approximately 3:30 AM this morning, patient's Brought in a flying squirrel.  This flying squirrel bit the patient on the area of first MCP of left hand.  She denies any other bite wounds or scratches.  Patient washed the area at home.  She applied essential oils.  She has had some pain and swelling that is localized to the area of the bite.  She presents to the ED for evaluation.    Home Medications Prior to Admission medications   Medication Sig Start Date End Date Taking? Authorizing Provider  clindamycin (CLEOCIN) 150 MG capsule Take 2 capsules (300 mg total) by mouth 3 (three) times daily for 7 days. 05/09/22 05/16/22 Yes Godfrey Pick, MD  doxycycline (VIBRAMYCIN) 100 MG capsule Take 1 capsule (100 mg total) by mouth 2 (two) times daily for 7 days. 05/09/22 05/16/22 Yes Godfrey Pick, MD  thyroid (NP THYROID) 30 MG tablet TAKE 1 TABLET BY MOUTH EVERY DAY 12/08/21   Burchette, Alinda Sierras, MD      Allergies    Penicillins, Adhesive [tape], Codeine, Lisinopril, Other, and Percocet [oxycodone-acetaminophen]    Review of Systems   Review of Systems  Skin:  Positive for wound.  All other systems reviewed and are negative.   Physical Exam Updated Vital Signs BP (!) 170/71 (BP Location: Right Arm)   Pulse 68   Temp 98.3 F (36.8 C) (Oral)   Resp 16   Ht '4\' 11"'$  (1.499 m)   Wt 56.2 kg   SpO2 96%   BMI 25.04 kg/m  Physical Exam Vitals and nursing note reviewed.  Constitutional:      General: She is not in acute distress.    Appearance: Normal appearance. She is well-developed. She is not ill-appearing,  toxic-appearing or diaphoretic.  HENT:     Head: Normocephalic and atraumatic.     Right Ear: External ear normal.     Left Ear: External ear normal.     Nose: Nose normal.  Eyes:     Extraocular Movements: Extraocular movements intact.     Conjunctiva/sclera: Conjunctivae normal.  Cardiovascular:     Rate and Rhythm: Normal rate and regular rhythm.     Heart sounds: No murmur heard. Pulmonary:     Effort: Pulmonary effort is normal. No respiratory distress.  Abdominal:     General: There is no distension.     Palpations: Abdomen is soft.  Musculoskeletal:        General: Swelling and tenderness present. Normal range of motion.     Cervical back: Normal range of motion and neck supple.     Comments: Chronic arthritic deformities of fingers.  Small area of swelling and tenderness on dorsal aspect of first MCP of left hand with 2 punctate bite marks.  Skin:    General: Skin is warm and dry.     Capillary Refill: Capillary refill takes less than 2 seconds.  Neurological:     General: No focal deficit present.     Mental Status: She is alert and oriented to person, place, and time.     Cranial Nerves: No cranial nerve  deficit.     Sensory: No sensory deficit.     Motor: No weakness.     Coordination: Coordination normal.  Psychiatric:        Mood and Affect: Mood normal.        Behavior: Behavior normal.        Thought Content: Thought content normal.        Judgment: Judgment normal.      ED Results / Procedures / Treatments   Labs (all labs ordered are listed, but only abnormal results are displayed) Labs Reviewed - No data to display  EKG None  Radiology DG Hand Complete Left  Result Date: 05/09/2022 CLINICAL DATA:  Scleral bite to the left thumb EXAM: LEFT HAND - COMPLETE 3+ VIEW COMPARISON:  None Available. FINDINGS: No opaque foreign body or acute fracture. Remote fracture at the base of the fifth metacarpal and at the neck of the fourth metacarpal. Generalized  osteopenia. Interphalangeal, thumb base, and STT osteoarthritis. Osteoarthritic erosive features are seen especially at the thumb, index, and middle finger interphalangeal joints. IMPRESSION: 1. No acute finding. 2. Erosive osteoarthritis and advanced osteopenia. Electronically Signed   By: Jorje Guild M.D.   On: 05/09/2022 10:34    Procedures Procedures    Medications Ordered in ED Medications  Tdap (BOOSTRIX) injection 0.5 mL (0.5 mLs Intramuscular Given 05/09/22 6962)    ED Course/ Medical Decision Making/ A&P                           Medical Decision Making Amount and/or Complexity of Data Reviewed Radiology: ordered.  Risk Prescription drug management.   Patient is a healthy 81 year old female who presents for a bite from a flying squirrel.  This occurred at 3:30 AM.  She denies any other areas of injuries.  On arrival in the ED, vital signs are notable for moderate hypertension.  She is well-appearing on exam.  She does have 2 punctate wounds to the dorsal aspect of her first MCP on the left hand.  There is some underlying swelling and tenderness.  X-ray imaging was obtained which did not show any foreign bodies.  Patient's tetanus was updated.  Given the punctate nature of the wounds as well as location on her hand, antibiotics to be prescribed.  She has a history of allergy to penicillin.  Alternative agents were prescribed.  She was discharged in good condition.        Final Clinical Impression(s) / ED Diagnoses Final diagnoses:  Wound due to squirrel bite    Rx / DC Orders ED Discharge Orders          Ordered    doxycycline (VIBRAMYCIN) 100 MG capsule  2 times daily        05/09/22 1123    clindamycin (CLEOCIN) 150 MG capsule  3 times daily        05/09/22 1123              Godfrey Pick, MD 05/09/22 1126

## 2022-06-22 NOTE — Chronic Care Management (AMB) (Signed)
Per 04/28/2022 notes pt will call back to reschedule

## 2022-06-30 DIAGNOSIS — L821 Other seborrheic keratosis: Secondary | ICD-10-CM | POA: Diagnosis not present

## 2022-06-30 DIAGNOSIS — Z85828 Personal history of other malignant neoplasm of skin: Secondary | ICD-10-CM | POA: Diagnosis not present

## 2022-08-22 DIAGNOSIS — N390 Urinary tract infection, site not specified: Secondary | ICD-10-CM | POA: Diagnosis not present

## 2022-08-22 DIAGNOSIS — R300 Dysuria: Secondary | ICD-10-CM | POA: Diagnosis not present

## 2022-08-22 DIAGNOSIS — A499 Bacterial infection, unspecified: Secondary | ICD-10-CM | POA: Diagnosis not present

## 2022-08-22 DIAGNOSIS — M545 Low back pain, unspecified: Secondary | ICD-10-CM | POA: Diagnosis not present

## 2022-08-22 DIAGNOSIS — R7303 Prediabetes: Secondary | ICD-10-CM | POA: Diagnosis not present

## 2022-08-22 DIAGNOSIS — R030 Elevated blood-pressure reading, without diagnosis of hypertension: Secondary | ICD-10-CM | POA: Diagnosis not present

## 2022-08-24 ENCOUNTER — Telehealth: Payer: Self-pay

## 2022-08-24 NOTE — Telephone Encounter (Signed)
-  Caller states she has a UTI and on antibiotic from [facility] on Saturday. S/S : right lower back pain (9/10) . TX: antibiotics, Advil -with no relief. Denies seeing blood in urine, frequency still present. Fever - denied  08/24/2022 11:22:48 AM See HCP within 4 Hours (or PCP triage) Cristino Martes, RN, Joelene Millin  Comments User: Jim Desanctis, RN Date/Time Julie [name] Time): 08/24/2022 11:21:03 AM ABX: Sulfamethoxypyridazine 800/'160mg'$  2 bid  User: Jim Desanctis, RN Date/Time Julie [name] Time): 08/24/2022 11:26:07 AM Warm conference call -  Referrals REFERRED TO PCP OFFICE  08/24/22 1535 - Pt states she continues to have flank pain. She went to [facility] on Saturday & started taking abx at that time. Offered pt appt with PCP on 08/25/22 (no open slots at this time) & pt declines b/c she has to work. Checked another office & pt declines that appt at 4:20 as well. Pt unsure if Julie [name] did urine culture. Pt also declines repeat visit or call to [facility]. Pt advised that if flank pain continues to call office for in person appt. Pt verb understanding.

## 2022-08-28 ENCOUNTER — Encounter: Payer: Self-pay | Admitting: Family Medicine

## 2022-08-28 ENCOUNTER — Ambulatory Visit (INDEPENDENT_AMBULATORY_CARE_PROVIDER_SITE_OTHER): Payer: PPO | Admitting: Family Medicine

## 2022-08-28 VITALS — BP 146/76 | HR 59 | Temp 97.4°F | Ht 59.0 in | Wt 129.6 lb

## 2022-08-28 DIAGNOSIS — R300 Dysuria: Secondary | ICD-10-CM

## 2022-08-28 DIAGNOSIS — M545 Low back pain, unspecified: Secondary | ICD-10-CM | POA: Diagnosis not present

## 2022-08-28 LAB — POC URINALSYSI DIPSTICK (AUTOMATED)
Bilirubin, UA: POSITIVE
Blood, UA: NEGATIVE
Glucose, UA: NEGATIVE
Ketones, UA: NEGATIVE
Nitrite, UA: NEGATIVE
Protein, UA: POSITIVE — AB
Spec Grav, UA: >=1.03 — AB (ref 1.010–1.025)
Urobilinogen, UA: 0.2 E.U./dL
pH, UA: 6 (ref 5.0–8.0)

## 2022-08-28 MED ORDER — MELOXICAM 15 MG PO TABS: 15.0000 mg | ORAL_TABLET | Freq: Every day | ORAL | 0 refills | Status: DC

## 2022-08-28 NOTE — Patient Instructions (Signed)
Let me know if back pain not improving in the next couple of weeks  Consider trial of over the counter Salon pas- topical lidocaine

## 2022-08-28 NOTE — Progress Notes (Signed)
Established Patient Office Visit  Subjective   Patient ID: Julie [name], female    DOB: [birth year]  Age: 81 y.o. MRN: [id number]  Chief Complaint  Patient presents with   Dysuria   Back Pain    Patient complains of back pain, x2 weeks, Tried Septra with little relief    HPI   Julie [name] is seen with onset over 2 weeks ago of right low back pain.  No sciatica symptoms.  She and her husband just got back from a trip to [place] about a week ago.  She went to a local urgent care and was placed on Septra DS for possible "UTI ".  Apparently she had trace of leukocytes on her dipstick but she has denied any urine frequency or burning with urination.  Her pain is right lower lumbar area below the kidney region.  Worse with standing or walking.  No radiculitis symptoms.  No lower extremity numbness or weakness.  No fevers or chills.  No nausea or vomiting.  No reported injury.  She had some leftover hydrocodone and took a very small piece of a tablet but had some nausea.  She has been intolerant of tramadol in the past.  She has tried both Aleve and Tylenol without any improvement.  Tried some heat without improvement.  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   Other bursitis disorders    history   Personal history of colonic polyps    PONV (postoperative nausea and vomiting)    Pre-diabetes    Pure hypercholesterolemia    Spondylolisthesis, cervical region    SVD (spontaneous vaginal delivery)    x 1   Unspecified disorder of  thyroid    Unspecified hypothyroidism    Past Surgical History:  Procedure Laterality Date   ABDOMINAL HYSTERECTOMY     ANTERIOR CERVICAL DECOMP/DISCECTOMY FUSION N/A 11/11/2017   Procedure: Cervical seven - Thoracic one  Anterior cervical decompression/discectomy/fusion;  Surgeon: Erline Levine, MD;  Location: [place];  Service: Neurosurgery;  Laterality: N/A;   ANTERIOR LAT LUMBAR FUSION Left 01/22/2017   Procedure: Left Lumbar three-four, Lumbar four-five  Anterior lateral lumbar interbody fusion;  Surgeon: Erline Levine, MD;  Location: [place];  Service: Neurosurgery;  Laterality: Left;   BACK SURGERY  2002   L4/L5   BLADDER SUSPENSION N/A 03/07/2014   Procedure: TRANSVAGINAL TAPE (TVT) PROCEDURE WITH CYSTO;  Surgeon: Princess Bruins, MD;  Location: [facility];  Service: Gynecology;  Laterality: N/A;  vaginal    blephorplasty      bilateral    BREAST SURGERY     x 2 breast implants, later removed   COLONOSCOPY     CYSTOCELE REPAIR N/A 03/07/2014   Procedure: ANTERIOR REPAIR (CYSTOCELE);  Surgeon: Princess Bruins, MD;  Location: [facility];  Service: Gynecology;  Laterality: N/A;   devaited septum surgery  1975   EYE SURGERY     bilateral cataract surgery   KNEE  ARTHROSCOPY  10/21/2012   right - Procedure: ARTHROSCOPY KNEE;  Surgeon: Mcarthur Rossetti, MD;  Location: [facility];  Service: Orthopedics;  Laterality: Left;  Left Knee Arthroscopy, Debridement, partial synovectomy   LUMBAR LAMINECTOMY     LUMBAR PERCUTANEOUS PEDICLE SCREW 2 LEVEL N/A 01/22/2017   Procedure: Lumbar Percutaneous Pedicle Screw Placement Lumbar three-four, Lumbar four-five;  Surgeon: Erline Levine, MD;  Location: [place];  Service: Neurosurgery;  Laterality: N/A;   ROBOTIC ASSISTED LAPAROSCOPIC SACROCOLPOPEXY N/A 03/07/2014   Procedure: ROBOTIC ASSISTED LAPAROSCOPIC SACROCOLPOPEXY With Carrolyn Meiers;  Surgeon: Princess Bruins, MD;  Location: [facility];  Service: Gynecology;  Laterality: N/A;   ROBOTIC ASSISTED SUPRACERVICAL  HYSTERECTOMY WITH BILATERAL SALPINGO OOPHERECTOMY N/A 03/07/2014   Procedure: ROBOTIC ASSISTED SUPRACERVICAL HYSTERECTOMY WITH BILATERAL SALPINGO OOPHORECTOMY;  Surgeon: Princess Bruins, MD;  Location: [facility];  Service: Gynecology;  Laterality: N/A;  4 hrs.   TONSILLECTOMY     TOTAL KNEE ARTHROPLASTY Left 12/30/2012   Procedure: TOTAL KNEE ARTHROPLASTY;  Surgeon: Mcarthur Rossetti, MD;  Location: [facility];  Service: Orthopedics;  Laterality: Left;  Left Total Knee Arthroplasty   TUBAL LIGATION     WISDOM TOOTH EXTRACTION      reports that she has never smoked. She has never used smokeless tobacco. She reports that she does not drink alcohol and does not use drugs. family history includes Cancer in her mother; Diabetes in her sister; Emphysema in her maternal aunt; Heart attack in her father; Stroke in her father. Allergies  Allergen Reactions   Penicillins Anaphylaxis    REACTION: causes tongue to swell Has patient had a PCN reaction causing immediate rash, facial/tongue/throat swelling, SOB or lightheadedness with hypotension: Yes swelling Has patient had a PCN reaction causing severe rash involving mucus membranes or skin necrosis:  Has patient had a PCN reaction that required hospitalization no Has patient had a PCN reaction occurring within the last 10 years: no If all of the above answers are "NO", then may proceed with Cephalosporin use.    Adhesive [Tape] Other (See Comments)    Please use paper tape   Codeine Nausea Only   Lisinopril     REACTION: wheezing   Other Other (See Comments)    Pt was having knee surgery and was given an epidural, which did not work.   Percocet [Oxycodone-Acetaminophen] Nausea And Vomiting    Review of Systems  Constitutional:  Negative for chills, fever, malaise/fatigue and weight loss.  Gastrointestinal:  Negative for abdominal pain.  Genitourinary:  Negative for flank pain and hematuria.  Musculoskeletal:  Positive for back pain.       Objective:     BP (!) 146/76 (BP Location: Left Arm, Patient Position: Sitting, Cuff Size: Normal)   Pulse (!) 59   Temp (!) 97.4 F (36.3 C) (Oral)   Ht '4\' 11"'$  (1.499 m)   Wt 129 lb 9.6 oz (58.8 kg)   SpO2 97%   BMI 26.18 kg/m  BP Readings from Last 3 Encounters:  08/28/22 (!) 146/76  05/09/22 (!) 170/71  01/06/22 (!) 150/86   Wt Readings from Last 3 Encounters:  08/28/22 129 lb 9.6 oz (58.8 kg)  05/09/22 124 lb (56.2 kg)  01/06/22 127 lb 1.6 oz (57.7 kg)      Physical Exam Vitals reviewed.  Constitutional:      Appearance: Normal appearance.  Cardiovascular:     Rate and Rhythm: Normal rate and regular rhythm.  Pulmonary:     Effort: Pulmonary effort is normal.  Breath sounds: Normal breath sounds.  Musculoskeletal:     Comments: Back exam reveals some mild tenderness right lower lumbar region.  No spinal tenderness.  Straight leg raise are negative bilaterally.  Neurological:     Mental Status: She is alert.     Comments: Full strength lower extremities.  Only trace reflexes knee and ankle bilaterally.      Results for orders placed or performed in visit on 08/28/22  POCT Urinalysis Dipstick (Automated)  Result Value Ref Range   Color, UA Yellow    Clarity, UA Clear    Glucose, UA Negative Negative   Bilirubin, UA Positive    Ketones, UA Negative    Spec Grav, UA >=1.030 (A) 1.010 - 1.025   Blood, UA Negative    pH, UA 6.0 5.0 - 8.0   Protein, UA Positive (A) Negative   Urobilinogen, UA 0.2 0.2 or 1.0 E.U./dL   Nitrite, UA Negative    Leukocytes, UA Trace (A) Negative      The ASCVD Risk score (Arnett DK, et al., 2019) failed to calculate for the following reasons:   The 2019 ASCVD risk score is only valid for ages 61 to 67    Assessment & Plan:   Presents with acute right lower lumbar back pain without radiculitis symptoms.  Doubt UTI.  She is already on Septra DS without improvement.  Urine dipstick today shows trace leukocytes of uncertain  significance.  Has not gotten relief with multiple over-the-counter things as above including Tylenol, Aleve, heat.  Previous intolerance with tramadol and hydrocodone and oxycodone.  -Consider over-the-counter topical patch such as topical lidocaine -Continue regular walking as tolerated -Consider short-term trial of meloxicam 15 mg once daily with food -Consider trial of physical therapy if not improving over the next couple weeks -Urine culture was sent for further evaluation   No follow-ups on file.    Carolann Littler, MD

## 2022-08-30 LAB — URINE CULTURE
MICRO NUMBER:: 14111425
Result:: NO GROWTH
SPECIMEN QUALITY:: ADEQUATE

## 2022-08-31 ENCOUNTER — Ambulatory Visit: Payer: PPO | Admitting: Family Medicine

## 2022-09-07 ENCOUNTER — Ambulatory Visit (INDEPENDENT_AMBULATORY_CARE_PROVIDER_SITE_OTHER): Payer: PPO

## 2022-09-07 ENCOUNTER — Telehealth: Payer: Self-pay | Admitting: Family Medicine

## 2022-09-07 ENCOUNTER — Other Ambulatory Visit: Payer: PPO

## 2022-09-07 DIAGNOSIS — M545 Low back pain, unspecified: Secondary | ICD-10-CM | POA: Diagnosis not present

## 2022-09-07 DIAGNOSIS — M4316 Spondylolisthesis, lumbar region: Secondary | ICD-10-CM | POA: Diagnosis not present

## 2022-09-07 NOTE — Telephone Encounter (Signed)
Left message for the pt to return my call. 

## 2022-09-07 NOTE — Telephone Encounter (Signed)
Pt called to say back pain is not any better and is requesting imaging or a referral to the urologist.  Please advise.

## 2022-09-07 NOTE — Telephone Encounter (Signed)
I spoke with the patient and informed her of the message below. Referral placed. Patient stated she would like to have imaging test ordered as she feels like this is needed.

## 2022-09-07 NOTE — Telephone Encounter (Signed)
I placed order for x-ray for lumbosacral spine and she will need to schedule that.  We should be able to get that here in our office  Eulas Post MD [facility]

## 2022-09-07 NOTE — Addendum Note (Signed)
Addended by: Eulas Post on: 09/07/2022 03:02 PM   Modules accepted: Orders

## 2022-09-07 NOTE — Telephone Encounter (Signed)
Patient informed and appt scheduled.

## 2022-09-08 NOTE — Telephone Encounter (Signed)
Results not yet viewed by PCP

## 2022-09-08 NOTE — Telephone Encounter (Signed)
Pt called to request a call back to go over her xray results.

## 2022-09-09 NOTE — Telephone Encounter (Signed)
Pt called to say she has been waiting several days for a call back  [phone]

## 2022-09-09 NOTE — Telephone Encounter (Signed)
Patient informed of the message and verbalized understanding 

## 2022-09-25 ENCOUNTER — Other Ambulatory Visit: Payer: Self-pay | Admitting: Family Medicine

## 2022-10-05 DIAGNOSIS — S32010A Wedge compression fracture of first lumbar vertebra, initial encounter for closed fracture: Secondary | ICD-10-CM | POA: Diagnosis not present

## 2022-10-05 DIAGNOSIS — M5416 Radiculopathy, lumbar region: Secondary | ICD-10-CM | POA: Diagnosis not present

## 2022-10-06 ENCOUNTER — Ambulatory Visit: Payer: PPO

## 2022-10-07 ENCOUNTER — Telehealth: Payer: Self-pay | Admitting: Family Medicine

## 2022-10-07 NOTE — Telephone Encounter (Signed)
Patient returned my call  She stated she was at work and could not talk right not.  She would call back to schedule

## 2022-10-07 NOTE — Telephone Encounter (Signed)
Left message for patient to call back and schedule Medicare Annual Wellness Visit (AWV) either virtually or in office. Left  my Herbie Drape number [phone]   Last AWV 09/30/21 please schedule with Nurse Health Adviser   45 min for awv-i and in office appointments 30 min for awv-s  phone/virtual appointments

## 2022-10-13 DIAGNOSIS — M5126 Other intervertebral disc displacement, lumbar region: Secondary | ICD-10-CM | POA: Diagnosis not present

## 2022-10-13 DIAGNOSIS — M5416 Radiculopathy, lumbar region: Secondary | ICD-10-CM | POA: Diagnosis not present

## 2022-10-13 DIAGNOSIS — M4807 Spinal stenosis, lumbosacral region: Secondary | ICD-10-CM | POA: Diagnosis not present

## 2022-10-16 DIAGNOSIS — M5416 Radiculopathy, lumbar region: Secondary | ICD-10-CM | POA: Diagnosis not present

## 2022-10-16 DIAGNOSIS — Z6826 Body mass index (BMI) 26.0-26.9, adult: Secondary | ICD-10-CM | POA: Diagnosis not present

## 2022-11-10 DIAGNOSIS — M5416 Radiculopathy, lumbar region: Secondary | ICD-10-CM | POA: Diagnosis not present

## 2022-11-10 DIAGNOSIS — M545 Low back pain, unspecified: Secondary | ICD-10-CM | POA: Diagnosis not present

## 2022-11-16 DIAGNOSIS — M545 Low back pain, unspecified: Secondary | ICD-10-CM | POA: Diagnosis not present

## 2022-11-16 DIAGNOSIS — M5416 Radiculopathy, lumbar region: Secondary | ICD-10-CM | POA: Diagnosis not present

## 2022-11-30 ENCOUNTER — Other Ambulatory Visit: Payer: Self-pay | Admitting: Family Medicine

## 2022-11-30 DIAGNOSIS — M5416 Radiculopathy, lumbar region: Secondary | ICD-10-CM | POA: Diagnosis not present

## 2022-11-30 DIAGNOSIS — M545 Low back pain, unspecified: Secondary | ICD-10-CM | POA: Diagnosis not present

## 2022-12-01 NOTE — Telephone Encounter (Signed)
Pt called to FU on this refill (thyroid (NP THYROID) 30 MG tablet )  Pt states she only has 2 pills left.  LOV:  08/28/22  [id number] [address], [place] AT [address] Phone: [phone]  Fax: [phone]

## 2022-12-07 ENCOUNTER — Ambulatory Visit (HOSPITAL_BASED_OUTPATIENT_CLINIC_OR_DEPARTMENT_OTHER)
Admission: RE | Admit: 2022-12-07 | Discharge: 2022-12-07 | Disposition: A | Discharge status: Home / Self Care | Payer: PPO | Source: Ambulatory Visit | Attending: Family Medicine | Admitting: Family Medicine

## 2022-12-07 ENCOUNTER — Ambulatory Visit (INDEPENDENT_AMBULATORY_CARE_PROVIDER_SITE_OTHER): Payer: PPO | Admitting: Family Medicine

## 2022-12-07 ENCOUNTER — Encounter: Payer: Self-pay | Admitting: Family Medicine

## 2022-12-07 VITALS — BP 116/78 | HR 82 | Temp 97.9°F | Ht 59.0 in | Wt 130.8 lb

## 2022-12-07 DIAGNOSIS — R0600 Dyspnea, unspecified: Secondary | ICD-10-CM | POA: Diagnosis not present

## 2022-12-07 DIAGNOSIS — M5416 Radiculopathy, lumbar region: Secondary | ICD-10-CM | POA: Diagnosis not present

## 2022-12-07 DIAGNOSIS — R0609 Other forms of dyspnea: Secondary | ICD-10-CM | POA: Diagnosis not present

## 2022-12-07 DIAGNOSIS — M545 Low back pain, unspecified: Secondary | ICD-10-CM | POA: Diagnosis not present

## 2022-12-07 DIAGNOSIS — E039 Hypothyroidism, unspecified: Secondary | ICD-10-CM

## 2022-12-07 LAB — CBC WITH DIFFERENTIAL/PLATELET
Basophils Absolute: 0 10*3/uL (ref 0.0–0.1)
Basophils Relative: 0.3 % (ref 0.0–3.0)
Eosinophils Absolute: 0.1 10*3/uL (ref 0.0–0.7)
Eosinophils Relative: 1.1 % (ref 0.0–5.0)
HCT: 38.1 % (ref 36.0–46.0)
Hemoglobin: 13 g/dL (ref 12.0–15.0)
Lymphocytes Relative: 21.2 % (ref 12.0–46.0)
Lymphs Abs: 1.7 10*3/uL (ref 0.7–4.0)
MCHC: 34.1 g/dL (ref 30.0–36.0)
MCV: 90.3 fl (ref 78.0–100.0)
Monocytes Absolute: 0.5 10*3/uL (ref 0.1–1.0)
Monocytes Relative: 6 % (ref 3.0–12.0)
Neutro Abs: 5.8 10*3/uL (ref 1.4–7.7)
Neutrophils Relative %: 71.4 % (ref 43.0–77.0)
Platelets: 244 10*3/uL (ref 150.0–400.0)
RBC: 4.22 Mil/uL (ref 3.87–5.11)
RDW: 14 % (ref 11.5–15.5)
WBC: 8.2 10*3/uL (ref 4.0–10.5)

## 2022-12-07 LAB — COMPREHENSIVE METABOLIC PANEL
ALT: 12 U/L (ref 0–35)
AST: 16 U/L (ref 0–37)
Albumin: 4.1 g/dL (ref 3.5–5.2)
Alkaline Phosphatase: 67 U/L (ref 39–117)
BUN: 18 mg/dL (ref 6–23)
CO2: 29 mEq/L (ref 19–32)
Calcium: 9.6 mg/dL (ref 8.4–10.5)
Chloride: 103 mEq/L (ref 96–112)
Creatinine, Ser: 0.69 mg/dL (ref 0.40–1.20)
GFR: 80.42 mL/min (ref >60.00)
Glucose, Bld: 117 mg/dL — ABNORMAL HIGH (ref 70–99)
Potassium: 4.3 mEq/L (ref 3.5–5.1)
Sodium: 141 mEq/L (ref 135–145)
Total Bilirubin: 0.5 mg/dL (ref 0.2–1.2)
Total Protein: 6.8 g/dL (ref 6.0–8.3)

## 2022-12-07 LAB — TSH: TSH: 2.2 u[IU]/mL (ref 0.35–5.50)

## 2022-12-07 LAB — BRAIN NATRIURETIC PEPTIDE: Pro B Natriuretic peptide (BNP): 16 pg/mL (ref 0.0–100.0)

## 2022-12-07 NOTE — Progress Notes (Signed)
Established Patient Office Visit  Subjective   Patient ID: Julie [name], female    DOB: 1941-02-28  Age: 82 y.o. MRN: [id number]  Chief Complaint  Patient presents with   Medication Refill   Shortness of Breath    X2 weeks, Denies any chest pain    HPI   Julie [name] is seen with some dyspnea with activity for the past couple weeks.  She states this is not normal for her.  She denies any chest pain.  She denies any recent cough.  No peripheral edema.  No orthopnea.  Weight is stable.  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  She had coronary calcium score of 306 with CT morphology study showing some plaque LAD and RCA of 50 to 69%.  Nuclear stress test unremarkable.  Denies any recent fevers or chills.  Appetite and weight stable.  She has hypothyroidism and is on replacement.  Her last TSH was over a year ago.  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   Other bursitis disorders    history   Personal history of colonic polyps    PONV (postoperative nausea and vomiting)    Pre-diabetes    Pure hypercholesterolemia    Spondylolisthesis, cervical region    SVD (spontaneous vaginal delivery)    x 1   Unspecified disorder of thyroid    Unspecified hypothyroidism    Past Surgical History:  Procedure Laterality Date   ABDOMINAL HYSTERECTOMY     ANTERIOR CERVICAL DECOMP/DISCECTOMY FUSION N/A 11/11/2017   Procedure: Cervical seven - Thoracic one  Anterior cervical  decompression/discectomy/fusion;  Surgeon: Erline Levine, MD;  Location: [place];  Service: Neurosurgery;  Laterality: N/A;   ANTERIOR LAT LUMBAR FUSION Left 01/22/2017   Procedure: Left Lumbar three-four, Lumbar four-five  Anterior lateral lumbar interbody fusion;  Surgeon: Erline Levine, MD;  Location: [place];  Service: Neurosurgery;  Laterality: Left;   BACK SURGERY  2002   L4/L5   BLADDER SUSPENSION N/A 03/07/2014   Procedure: TRANSVAGINAL TAPE (TVT) PROCEDURE WITH CYSTO;  Surgeon: Princess Bruins, MD;  Location: [facility];  Service: Gynecology;  Laterality: N/A;  vaginal    blephorplasty      bilateral    BREAST SURGERY     x 2 breast implants, later removed   COLONOSCOPY     CYSTOCELE REPAIR N/A 03/07/2014   Procedure: ANTERIOR REPAIR (CYSTOCELE);  Surgeon: Princess Bruins, MD;  Location: [facility];  Service: Gynecology;  Laterality: N/A;   devaited septum surgery  1975   EYE SURGERY     bilateral cataract surgery   KNEE ARTHROSCOPY  10/21/2012   right - Procedure: ARTHROSCOPY KNEE;  Surgeon: Mcarthur Rossetti, MD;  Location: [facility];  Service: Orthopedics;  Laterality: Left;  Left Knee Arthroscopy, Debridement, partial synovectomy   LUMBAR LAMINECTOMY     LUMBAR PERCUTANEOUS PEDICLE SCREW 2 LEVEL N/A  01/22/2017   Procedure: Lumbar Percutaneous Pedicle Screw Placement Lumbar three-four, Lumbar four-five;  Surgeon: Erline Levine, MD;  Location: [facility];  Service: Neurosurgery;  Laterality: N/A;   ROBOTIC ASSISTED LAPAROSCOPIC SACROCOLPOPEXY N/A 03/07/2014   Procedure: ROBOTIC ASSISTED LAPAROSCOPIC SACROCOLPOPEXY With Carrolyn Meiers;  Surgeon: Princess Bruins, MD;  Location: [facility];  Service: Gynecology;  Laterality: N/A;   ROBOTIC ASSISTED SUPRACERVICAL HYSTERECTOMY WITH BILATERAL SALPINGO OOPHERECTOMY N/A 03/07/2014   Procedure: ROBOTIC ASSISTED SUPRACERVICAL HYSTERECTOMY WITH BILATERAL SALPINGO OOPHORECTOMY;  Surgeon: Princess Bruins, MD;  Location: [facility];  Service: Gynecology;  Laterality: N/A;  4  hrs.   TONSILLECTOMY     TOTAL KNEE ARTHROPLASTY Left 12/30/2012   Procedure: TOTAL KNEE ARTHROPLASTY;  Surgeon: Mcarthur Rossetti, MD;  Location: [facility];  Service: Orthopedics;  Laterality: Left;  Left Total Knee Arthroplasty   TUBAL LIGATION     WISDOM TOOTH EXTRACTION      reports that she has never smoked. She has never used smokeless tobacco. She reports that she does not drink alcohol and does not use drugs. family history includes Cancer in her mother; Diabetes in her sister; Emphysema in her maternal aunt; Heart attack in her father; Stroke in her father. Allergies  Allergen Reactions   Penicillins Anaphylaxis    REACTION: causes tongue to swell Has patient had a PCN reaction causing immediate rash, facial/tongue/throat swelling, SOB or lightheadedness with hypotension: Yes swelling Has patient had a PCN reaction causing severe rash involving mucus membranes or skin necrosis:  Has patient had a PCN reaction that required hospitalization no Has patient had a PCN reaction occurring within the last 10 years: no If all of the above answers are "NO", then may proceed with Cephalosporin use.    Adhesive [Tape] Other (See Comments)    Please use paper tape   Codeine Nausea Only   Lisinopril     REACTION: wheezing   Other Other (See Comments)    Pt was having knee surgery and was given an epidural, which did not work.   Percocet [Oxycodone-Acetaminophen] Nausea And Vomiting    Review of Systems  Constitutional:  Negative for chills, fever and weight loss.  Respiratory:  Positive for shortness of breath. Negative for cough, hemoptysis, sputum production and wheezing.   Cardiovascular:  Negative for chest pain, palpitations, orthopnea, leg swelling and PND.  Neurological:  Negative for dizziness and loss of consciousness.      Objective:     BP 116/78 (BP Location: Left Arm, Patient Position: Sitting, Cuff Size: Normal)   Pulse 82   Temp 97.9 F (36.6 C) (Oral)   Ht 4'  11" (1.499 m)   Wt 130 lb 12.8 oz (59.3 kg)   SpO2 95%   BMI 26.42 kg/m  BP Readings from Last 3 Encounters:  12/07/22 116/78  08/28/22 (!) 146/76  05/09/22 (!) 170/71   Wt Readings from Last 3 Encounters:  12/07/22 130 lb 12.8 oz (59.3 kg)  08/28/22 129 lb 9.6 oz (58.8 kg)  05/09/22 124 lb (56.2 kg)      Physical Exam Vitals reviewed.  Constitutional:      General: She is not in acute distress.    Appearance: She is well-developed.  Cardiovascular:     Rate and Rhythm: Normal rate and regular rhythm.  Pulmonary:     Effort: Pulmonary effort is normal.     Breath sounds: Normal breath sounds. No decreased breath sounds, wheezing or rales.  Musculoskeletal:     Cervical back: Neck supple.  Right lower leg: No edema.     Left lower leg: No edema.  Neurological:     Mental Status: She is alert.      No results found for any visits on 12/07/22.    The ASCVD Risk score (Arnett DK, et al., 2019) failed to calculate for the following reasons:   The 2019 ASCVD risk score is only valid for ages 29 to 29    Assessment & Plan:   #1 dyspnea on exertion.  Patient has no chronic lung issues.  Never smoked.  Does not have any evidence on exam to suggest volume overload.  No known history of heart failure.  Denies any pleuritic pain or peripheral edema or calf pain.  -Check EKG -Consider PA and lateral chest x-ray -Consider echocardiogram to further assess -Check labs with CBC, CMP, BNP -If all above unremarkable consider referral back to cardiology and possible PFTs -We also asked that she consider getting home pulse oximeter to monitor oxygen levels both at rest and with activity  #2 hypothyroidism.  She is on low-dose thyroid replacement.  Recheck TSH  Carolann Littler, MD

## 2022-12-07 NOTE — Addendum Note (Signed)
Addended by: Eulas Post on: 12/07/2022 02:21 PM   Modules accepted: Orders

## 2022-12-07 NOTE — Patient Instructions (Signed)
Go for the CXR at [facility] through Friday 8 AM to 5 PM.

## 2022-12-09 ENCOUNTER — Telehealth (HOSPITAL_BASED_OUTPATIENT_CLINIC_OR_DEPARTMENT_OTHER): Payer: Self-pay | Admitting: *Deleted

## 2022-12-09 DIAGNOSIS — M5416 Radiculopathy, lumbar region: Secondary | ICD-10-CM | POA: Diagnosis not present

## 2022-12-09 DIAGNOSIS — Z6826 Body mass index (BMI) 26.0-26.9, adult: Secondary | ICD-10-CM | POA: Diagnosis not present

## 2022-12-09 NOTE — Telephone Encounter (Signed)
Left message for patient to call and discuss scheduling the Echocardiogram ordered by Dr. Elease Hashimoto

## 2022-12-14 DIAGNOSIS — M5416 Radiculopathy, lumbar region: Secondary | ICD-10-CM | POA: Diagnosis not present

## 2022-12-14 DIAGNOSIS — M545 Low back pain, unspecified: Secondary | ICD-10-CM | POA: Diagnosis not present

## 2022-12-29 ENCOUNTER — Encounter: Payer: Self-pay | Admitting: Family Medicine

## 2022-12-29 ENCOUNTER — Telehealth (INDEPENDENT_AMBULATORY_CARE_PROVIDER_SITE_OTHER): Payer: PPO | Admitting: Family Medicine

## 2022-12-29 ENCOUNTER — Other Ambulatory Visit: Payer: Self-pay | Admitting: Family Medicine

## 2022-12-29 VITALS — Ht 59.0 in | Wt 130.0 lb

## 2022-12-29 DIAGNOSIS — Z85828 Personal history of other malignant neoplasm of skin: Secondary | ICD-10-CM | POA: Diagnosis not present

## 2022-12-29 DIAGNOSIS — D1801 Hemangioma of skin and subcutaneous tissue: Secondary | ICD-10-CM | POA: Diagnosis not present

## 2022-12-29 DIAGNOSIS — L812 Freckles: Secondary | ICD-10-CM | POA: Diagnosis not present

## 2022-12-29 DIAGNOSIS — Z0000 Encounter for general adult medical examination without abnormal findings: Secondary | ICD-10-CM

## 2022-12-29 DIAGNOSIS — D485 Neoplasm of uncertain behavior of skin: Secondary | ICD-10-CM | POA: Diagnosis not present

## 2022-12-29 DIAGNOSIS — C44722 Squamous cell carcinoma of skin of right lower limb, including hip: Secondary | ICD-10-CM | POA: Diagnosis not present

## 2022-12-29 DIAGNOSIS — L821 Other seborrheic keratosis: Secondary | ICD-10-CM | POA: Diagnosis not present

## 2022-12-29 NOTE — Progress Notes (Signed)
PATIENT CHECK-IN and HEALTH RISK ASSESSMENT QUESTIONNAIRE:  -completed by phone/video for upcoming Medicare Preventive Visit  Pre-Visit Check-in: 1)Vitals (height, wt, BP, etc) - record in vitals section for visit on day of visit 2)Review and Update Medications, Allergies PMH, Surgeries, Social history in Epic 3)Hospitalizations in the last year with date/reason? no  4)Review and Update Care Team (patient's specialists) in Epic 5) Complete PHQ9 in Epic  6) Complete Fall Screening in Epic 7)Review all Health Maintenance Due and order under PCP if not done.  8)Medicare Wellness Questionnaire: Answer theses question about your habits: Do you drink alcohol? no If yes, how many drinks do you have a day?no Have you ever smoked?no Quit date if applicable? N/a  How many packs a day do/did you smoke? N/a Do you use smokeless tobacco?no Do you use an illicit drugs?no Do you exercises? No has trouble with breathing. Due to enlarge lung IF so, what type and how many days/minutes per week?n/a Are you sexually active? No Number of partners?n/a Typical breakfast: cereal, egg Typical lunch: sandwich, salad Typical dinner: meat-had pork chopped last night, vegetable Typical snacks:apple,grape  Beverages: water, green tea in the morning  Answer theses question about you: Can you perform most household chores?yes Do you find it hard to follow a conversation in a noisy room?no Do you often ask people to speak up or repeat themselves?no Do you feel that you have a problem with memory? no Do you balance your checkbook and or bank acounts?yes Do you feel safe at home?yes Last dentist visit?have upper and lower dental implant. Had it done 3 months ago.  Do you need assistance with any of the following: Please note if so No  Driving?  Feeding yourself?  Getting from bed to chair?  Getting to the toilet?  Bathing or showering?  Dressing yourself?  Managing money?  Climbing a flight of  stairs  Preparing meals?  Do you have Advanced Directives in place (Living Will, Healthcare Power or Attorney)? No - plans to do soon   Last eye Exam and location?last year at Dr. Tobie Lords with [facility] on [facility].    Do you currently use prescribed or non-prescribed narcotic or opioid pain medications?no  Do you have a history or close family history of breast, ovarian, tubal or peritoneal cancer or a family member with BRCA (breast cancer susceptibility 1 and 2) gene mutations? Mother had breast cancer  Nurse/Assistant Credentials/time stamp: Karpuih Moyun/CMA/5:02pm.    ----------------------------------------------------------------------------------------------------------------------------------------------------------------------------------------------------------------------   MEDICARE ANNUAL PREVENTIVE VISIT WITH PROVIDER: (Welcome to [employer], initial annual wellness or annual wellness exam)  Virtual Visit via Video Note  I connected with Julie [name] on 12/29/22 by a video enabled telemedicine application and verified that I am speaking with the correct person using two identifiers.  Location patient: home Location provider:work or home office Persons participating in the virtual visit: patient, provider  Concerns and/or follow up today: Chronic issues with sleeping her whole life. Has difficulty falling asleep. Bedtime sporadic. Wake up time varies every day as well - needs to be up by 8 most days for work. Screen time before bed present. Wakes up to use the restroom at times during the night. Melatonin has not been useful in the past.   Saw PCP recently and is having workup for dyspnea.  She has had BP problems in the past but did not tolerate medication. She wonders how to treat and monitor. Has cuff but doe snot know how to use it properly or if  it is working. BP ok last check in the office.    See HM section in Epic for other details of completed  HM.    ROS: negative for report of fevers, unintentional weight loss, vision changes, vision loss, hearing loss or change, chest pain,  hemoptysis, melena, hematochezia, hematuria, genital discharge or lesions, falls, bleeding or bruising, loc, thoughts of suicide or self harm, memory loss  Patient-completed extensive health risk assessment - reviewed and discussed with the patient: See Health Risk Assessment completed with patient prior to the visit either above or in recent phone note. This was reviewed in detailed with the patient today and appropriate recommendations, orders and referrals were placed as needed per Summary below and patient instructions.   Review of Medical History: -PMH, PSH, Family History and current specialty and care providers reviewed and updated and listed below   Patient Care Team: Eulas Post, MD as PCP - General (Family Medicine) Erline Levine, MD as Consulting Physician (Neurosurgery) Dermatology, [place], Mariam Dollar, [facility] (Inactive) as Pharmacist (Pharmacist)   Past Medical History:  Diagnosis Date   Arthritis    lower back shoulder   Arthropathy, unspecified, site unspecified    Cancer ([place])    skin -spots on face removed   Contact dermatitis and other eczema, due to unspecified cause    History   Depression    GERD (gastroesophageal reflux disease)    history - no current prob - no med   Hypertension    was taking lisinopril but had an allergic reaction to medicaion   Neuromuscular disorder (HCC)    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   Other bursitis disorders    history   Personal history of colonic polyps    PONV (postoperative nausea and vomiting)    Pre-diabetes    Pure hypercholesterolemia    Spondylolisthesis, cervical region    SVD (spontaneous vaginal delivery)    x 1   Unspecified disorder of thyroid    Unspecified hypothyroidism     Past Surgical History:  Procedure Laterality Date    ABDOMINAL HYSTERECTOMY     ANTERIOR CERVICAL DECOMP/DISCECTOMY FUSION N/A 11/11/2017   Procedure: Cervical seven - Thoracic one  Anterior cervical decompression/discectomy/fusion;  Surgeon: Erline Levine, MD;  Location: [place];  Service: Neurosurgery;  Laterality: N/A;   ANTERIOR LAT LUMBAR FUSION Left 01/22/2017   Procedure: Left Lumbar three-four, Lumbar four-five  Anterior lateral lumbar interbody fusion;  Surgeon: Erline Levine, MD;  Location: [place];  Service: Neurosurgery;  Laterality: Left;   BACK SURGERY  2002   L4/L5   BLADDER SUSPENSION N/A 03/07/2014   Procedure: TRANSVAGINAL TAPE (TVT) PROCEDURE WITH CYSTO;  Surgeon: Princess Bruins, MD;  Location: [facility];  Service: Gynecology;  Laterality: N/A;  vaginal    blephorplasty      bilateral    BREAST SURGERY     x 2 breast implants, later removed   COLONOSCOPY     CYSTOCELE REPAIR N/A 03/07/2014   Procedure: ANTERIOR REPAIR (CYSTOCELE);  Surgeon: Princess Bruins, MD;  Location: [facility];  Service: Gynecology;  Laterality: N/A;   devaited septum surgery  1975   EYE SURGERY     bilateral cataract surgery   KNEE ARTHROSCOPY  10/21/2012   right - Procedure: ARTHROSCOPY KNEE;  Surgeon: Mcarthur Rossetti, MD;  Location: [facility];  Service: Orthopedics;  Laterality: Left;  Left Knee Arthroscopy, Debridement, partial synovectomy   LUMBAR LAMINECTOMY     LUMBAR PERCUTANEOUS  PEDICLE SCREW 2 LEVEL N/A 01/22/2017   Procedure: Lumbar Percutaneous Pedicle Screw Placement Lumbar three-four, Lumbar four-five;  Surgeon: Erline Levine, MD;  Location: [facility];  Service: Neurosurgery;  Laterality: N/A;   ROBOTIC ASSISTED LAPAROSCOPIC SACROCOLPOPEXY N/A 03/07/2014   Procedure: ROBOTIC ASSISTED LAPAROSCOPIC SACROCOLPOPEXY With Carrolyn Meiers;  Surgeon: Princess Bruins, MD;  Location: [facility];  Service: Gynecology;  Laterality: N/A;   ROBOTIC ASSISTED SUPRACERVICAL HYSTERECTOMY WITH BILATERAL SALPINGO OOPHERECTOMY N/A 03/07/2014   Procedure: ROBOTIC ASSISTED SUPRACERVICAL  HYSTERECTOMY WITH BILATERAL SALPINGO OOPHORECTOMY;  Surgeon: Princess Bruins, MD;  Location: [facility];  Service: Gynecology;  Laterality: N/A;  4 hrs.   TONSILLECTOMY     TOTAL KNEE ARTHROPLASTY Left 12/30/2012   Procedure: TOTAL KNEE ARTHROPLASTY;  Surgeon: Mcarthur Rossetti, MD;  Location: [facility];  Service: Orthopedics;  Laterality: Left;  Left Total Knee Arthroplasty   TUBAL LIGATION     WISDOM TOOTH EXTRACTION      Social History   Socioeconomic History   Marital status: Married    Spouse name: Not on file   Number of children: Not on file   Years of education: Not on file   Highest education level: GED or equivalent  Occupational History   Not on file  Tobacco Use   Smoking status: Never   Smokeless tobacco: Never  Vaping Use   Vaping Use: Never used  Substance and Sexual Activity   Alcohol use: No   Drug use: No   Sexual activity: Yes    Birth control/protection: Post-menopausal  Other Topics Concern   Not on file  Social History Narrative   Not on file   Social Determinants of Health   Financial Resource Strain: Low Risk  (09/30/2021)   Overall Financial Resource Strain (CARDIA)    Difficulty of Paying Living Expenses: Not hard at all  Food Insecurity: No Food Insecurity (09/30/2021)   Hunger Vital Sign    Worried About Running Out of Food in the Last Year: Never true    Ran Out of Food in the Last Year: Never true  Transportation Needs: No Transportation Needs (09/30/2021)   PRAPARE - Hydrologist (Medical): No    Lack of Transportation (Non-Medical): No  Physical Activity: Sufficiently Active (09/30/2021)   Exercise Vital Sign    Days of Exercise per Week: 3 days    Minutes of Exercise per Session: 60 min  Recent Concern: Physical Activity - Insufficiently Active (09/21/2021)   Exercise Vital Sign    Days of Exercise per Week: 3 days    Minutes of Exercise per Session: 40 min  Stress: No Stress Concern Present (09/30/2021)    [place]    Feeling of Stress : Not at all  Social Connections: Unknown (09/21/2021)   Social Connection and Isolation Panel [NHANES]    Frequency of Communication with Friends and Family: More than three times a week    Frequency of Social Gatherings with Friends and Family: More than three times a week    Attends Religious Services: Patient refused    Marine scientist or Organizations: Yes    Attends Archivist Meetings: 1 to 4 times per year    Marital Status: Married  Human resources officer Violence: Not on file    Family History  Problem Relation Age of Onset   Cancer Mother        breast and lung   Stroke Father    Heart attack  Father    Diabetes Sister    Emphysema Maternal Aunt    Colon cancer Neg Hx     Current Outpatient Medications on File Prior to Visit  Medication Sig Dispense Refill   meloxicam (MOBIC) 15 MG tablet TAKE 1 TABLET(15 MG) BY MOUTH DAILY (Patient not taking: Reported on 12/29/2022) 30 tablet 0   No current facility-administered medications on file prior to visit.    Allergies  Allergen Reactions   Penicillins Anaphylaxis    REACTION: causes tongue to swell Has patient had a PCN reaction causing immediate rash, facial/tongue/throat swelling, SOB or lightheadedness with hypotension: Yes swelling Has patient had a PCN reaction causing severe rash involving mucus membranes or skin necrosis:  Has patient had a PCN reaction that required hospitalization no Has patient had a PCN reaction occurring within the last 10 years: no If all of the above answers are "NO", then may proceed with Cephalosporin use.    Adhesive [Tape] Other (See Comments)    Please use paper tape   Codeine Nausea Only   Lisinopril     REACTION: wheezing   Other Other (See Comments)    Pt was having knee surgery and was given an epidural, which did not work.   Percocet [Oxycodone-Acetaminophen] Nausea  And Vomiting       Physical Exam There were no vitals filed for this visit. Estimated body mass index is 26.26 kg/m as calculated from the following:   Height as of this encounter: '4\' 11"'$  (1.499 m).   Weight as of this encounter: 130 lb (59 kg).  EKG (optional): deferred due to virtual visit  GENERAL: alert, oriented, no acute distress detected, full vision exam deferred due to pandemic and/or virtual encounter   HEENT: atraumatic, conjunttiva clear, no obvious abnormalities on inspection of external nose and ears  NECK: normal movements of the head and neck  LUNGS: on inspection no signs of respiratory distress, breathing rate appears normal, no obvious gross SOB, gasping or wheezing  CV: no obvious cyanosis  MS: moves all visible extremities without noticeable abnormality  PSYCH/NEURO: pleasant and cooperative, no obvious depression or anxiety, speech and thought processing grossly intact, Cognitive function grossly intact  Flowsheet Row Video Visit from 12/29/2022 in [place] at [facility]  PHQ-9 Total Score 5           12/29/2022    4:53 PM 12/07/2022    3:16 PM 08/28/2022    7:42 AM 09/30/2021    1:35 PM 09/22/2021    2:19 PM  Depression screen PHQ 2/9  Decreased Interest 0 0 0 0 0  Down, Depressed, Hopeless 0 0 0 0 0  PHQ - 2 Score 0 0 0 0 0  Altered sleeping 3 2     Tired, decreased energy 2 2     Change in appetite 0 1     Feeling bad or failure about yourself  0 0     Trouble concentrating 0 0     Moving slowly or fidgety/restless 0 0     Suicidal thoughts 0 0     PHQ-9 Score 5 5     Difficult doing work/chores Somewhat difficult Not difficult at all          09/25/2021    3:32 PM 09/30/2021    1:34 PM 05/09/2022    5:03 AM 08/28/2022    7:42 AM 12/29/2022    4:53 PM  Fall Risk  Falls in the past year? 0 0  0 0  Was there an injury with Fall? 0   0 0  Fall Risk Category Calculator 0   0 0  Fall Risk Category (Retired) Low    Low   (RETIRED) Patient Fall Risk Level  Low fall risk Low fall risk Low fall risk   Patient at Risk for Falls Due to  No Fall Risks  No Fall Risks No Fall Risks  Fall risk Follow up  Falls evaluation completed;Education provided;Falls prevention discussed  Falls evaluation completed Falls evaluation completed     SUMMARY AND PLAN:  Encounter for Medicare annual wellness exam    Discussed applicable health maintenance/preventive health measures and advised and referred or ordered per patient preferences: Discussed shingrix, she is considering and advised can get at the pharmacy if decides to get it. She is not interested in the other vaccines at this time. Health Maintenance  Topic Date Due   Zoster Vaccines- Shingrix (1 of 2) Never done   INFLUENZA VACCINE  01/31/2023 (Originally 06/02/2022)   COVID-19 Vaccine (3 - 2023-24 season) 01/14/2024 (Originally 07/03/2022)   Medicare Annual Wellness (AWV)  12/30/2023   DTaP/Tdap/Td (3 - Td or Tdap) 05/09/2032   Pneumonia Vaccine 45+ Years old  Completed   DEXA SCAN  Completed   HPV VACCINES  Aged Out   Education and counseling on the following was provided based on the above review of health and a plan/checklist for the patient, along with additional information discussed, was provided for the patient in the patient instructions :  -Advised on importance of and resources for completing advanced directives - she and husband have info and she agrees to do this -Advised and counseled on maintaining healthy weight and healthy lifestyle - including the importance of a healthy diet, regular physical activity, social connections and stress management. -Advised and counseled on a whole foods based healthy diet and regular exercise: discussed a heart healthy whole foods based diet at length. A summary of a healthy diet was provided in the Patient Instructions. She is interested in things to manage BP - advised low sodium ('1500mg'$  per day) and leafy greens  and berries.  Recommended once she has evaluation for the dyspnea and has permission from her doctors to exercise to try to slowly build up to  regular exercise and discussed options. -spent a long time discussing and counseling on sleep. Decided to start with routine wake time that would allow for her to get up for work. Light at wake time. Set bedtime at least 8 hours prior to wake time to start with and no screens and dim lights 2 hours prior to bed time. Discussed bedtime routine and ways to minimize noise form pets disturbing her at night. Also, discussed red light night lights for nighttime bathroom trips and avoid all other lights during sleep time including devices and clocks.  -Advise yearly dental visits at minimum and regular eye exams   Follow up: see patient instructions     Patient Instructions  I really enjoyed getting to talk with you today! I am available on Tuesdays and Thursdays for virtual visits if you have any questions or concerns, or if I can be of any further assistance.   CHECKLIST FROM ANNUAL WELLNESS VISIT:  -Follow up (please call to schedule if not scheduled after visit):   -yearly for annual wellness visit with primary care office  Here is a list of your preventive care/health maintenance measures and the plan for each if any are due:  Health  Maintenance  Topic Date Due   Zoster Vaccines- Shingrix (1 of 2) Never done   INFLUENZA VACCINE  01/31/2023 (Originally 06/02/2022)   COVID-19 Vaccine (3 - 2023-24 season) 01/14/2024 (Originally 07/03/2022)   Medicare Annual Wellness (AWV)  12/30/2023   DTaP/Tdap/Td (3 - Td or Tdap) 05/09/2032   Pneumonia Vaccine 39+ Years old  Completed   DEXA SCAN  Completed   HPV VACCINES  Aged Out    -See a dentist at least yearly  -Get your eyes checked and then per your eye specialist's recommendations  -Other issues addressed today:  -Sleep:  -consider setting stable wake up and sleep time.  -Avoid screens (tv, phone,  computer, etc and bright lights in the 2 hours before bed.  -get red light night lights so that you do not have to turn on lights when you get up to go to the bathrrom -dim lights a few hours before bed -set calming bedtime routine -use white noise if needed to avoid being woken up by your pets during the night -when you wake up/or at your designated wake up time turn on bright lights/open curtains -if tossing or turning more than 15 minutes in bed get out of bed and do quiet activity - reserve bed for sleep only, do not do other activities in bed  For Blood Pressure: -eat a healthy low sodium diet (see more info below), eat leafy greens and fresh or frozen berries daily if possible -once you have been given the ok by your doctors, consider slowly adding exercise -get a blood pressure cuff - bring it to your next appointment with Dr. Elease Hashimoto so that he can show you how to use it properly and check that it is working. Then you will be able to monitor your BP at home.   -I have included below further information regarding a healthy whole foods based diet, physical activity guidelines for adults, stress management and opportunities for social connections. I hope you find this information useful.   -----------------------------------------------------------------------------------------------------------------------------------------------------------------------------------------------------------------------------------------------------------  NUTRITION: -eat real food: lots of colorful vegetables (half the plate) and fruits -5-7 servings of vegetables and fruits per day (fresh or steamed is best), exp. 2 servings of vegetables with lunch and dinner and 2 servings of fruit per day. Berries and greens such as kale and collards are great choices.  -consume on a regular basis: whole grains (make sure first ingredient on label contains the word "whole"), fresh fruits, fish, nuts, seeds, healthy oils  (such as olive oil, avocado oil, grape seed oil) -may eat small amounts of dairy and lean meat on occasion, but avoid processed meats such as ham, bacon, lunch meat, etc. -drink water -try to avoid fast food and pre-packaged foods, processed meat -most experts advise limiting sodium to < '2300mg'$  per day, should limit further is any chronic conditions such as high blood pressure, heart disease, diabetes, etc. The American Heart Association advised that < '1500mg'$  is is ideal -try to avoid foods that contain any ingredients with names you do not recognize  -try to avoid sugar/sweets (except for the natural sugar that occurs in fresh fruit) -try to avoid sweet drinks -try to avoid white rice, white bread, pasta (unless whole grain), white or yellow potatoes  EXERCISE GUIDELINES FOR ADULTS: -if you wish to increase your physical activity, do so gradually and with the approval of your doctor -STOP and seek medical care immediately if you have any chest pain, chest discomfort or trouble breathing when starting or increasing exercise  -move  and stretch your body, legs, feet and arms when sitting for long periods -Physical activity guidelines for optimal health in adults: -least 150 minutes per week of aerobic exercise (can talk, but not sing) once approved by your doctor, 20-30 minutes of sustained activity or two 10 minute episodes of sustained activity every day.  -resistance training at least 2 days per week if approved by your doctor -balance exercises 3+ days per week:   Stand somewhere where you have something sturdy to hold onto if you lose balance.    1) lift up on toes, start with 5x per day and work up to 20x   2) stand and lift on leg straight out to the side so that foot is a few inches of the floor, start with 5x each side and work up to 20x each side   3) stand on one foot, start with 5 seconds each side and work up to 20 seconds on each side  If you need ideas or help with getting more  active:  -Silver sneakers https://tools.silversneakers.com  -Walk with a Doc: http://stephens-thompson.biz/  -try to include resistance (weight lifting/strength building) and balance exercises twice per week: or the following link for ideas: ChessContest.fr  UpdateClothing.com.cy  STRESS MANAGEMENT: -can try meditating, or just sitting quietly with deep breathing while intentionally relaxing all parts of your body for 5 minutes daily -if you need further help with stress, anxiety or depression please follow up with your primary doctor or contact the wonderful folks at [place]: [place]: -options in [place] if you wish to engage in more social and exercise related activities:  -Silver sneakers https://tools.silversneakers.com  -Walk with a Doc: http://stephens-thompson.biz/  -Check out the [place] 50+ section on the [place] [employer] (hiking clubs, book clubs, cards and games, chess, exercise classes, aquatic classes and much more) - see the website for details: https://www.[place].gov/departments/parks-recreation/active-adults50  -YouTube has lots of exercise videos for different ages and abilities as well  -[place] (a variety of indoor and outdoor inperson activities for adults). [phone]. [address]  -Virtual Online Classes (a variety of topics): see seniorplanet.org or call [phone]  -consider volunteering at a school, hospice center, church, senior center or elsewhere           Lucretia Kern, DO

## 2022-12-29 NOTE — Patient Instructions (Addendum)
I really enjoyed getting to talk with you today! I am available on Tuesdays and Thursdays for virtual visits if you have any questions or concerns, or if I can be of any further assistance.   CHECKLIST FROM ANNUAL WELLNESS VISIT:  -Follow up (please call to schedule if not scheduled after visit):   -yearly for annual wellness visit with primary care office  Here is a list of your preventive care/health maintenance measures and the plan for each if any are due:  Health Maintenance  Topic Date Due   Zoster Vaccines- Shingrix (1 of 2) Never done   INFLUENZA VACCINE  01/31/2023 (Originally 06/02/2022)   COVID-19 Vaccine (3 - 2023-24 season) 01/14/2024 (Originally 07/03/2022)   Medicare Annual Wellness (AWV)  12/30/2023   DTaP/Tdap/Td (3 - Td or Tdap) 05/09/2032   Pneumonia Vaccine 51+ Years old  Completed   DEXA SCAN  Completed   HPV VACCINES  Aged Out    -See a dentist at least yearly  -Get your eyes checked and then per your eye specialist's recommendations  -Other issues addressed today:  -Sleep:  -consider setting stable wake up and sleep time.  -Avoid screens (tv, phone, computer, etc and bright lights in the 2 hours before bed.  -get red light night lights so that you do not have to turn on lights when you get up to go to the bathrrom -dim lights a few hours before bed -set calming bedtime routine -use white noise if needed to avoid being woken up by your pets during the night -when you wake up/or at your designated wake up time turn on bright lights/open curtains -if tossing or turning more than 15 minutes in bed get out of bed and do quiet activity - reserve bed for sleep only, do not do other activities in bed  For Blood Pressure: -eat a healthy low sodium diet (see more info below), eat leafy greens and fresh or frozen berries daily if possible -once you have been given the ok by your doctors, consider slowly adding exercise -get a blood pressure cuff - bring it to your  next appointment with Dr. Elease Hashimoto so that he can show you how to use it properly and check that it is working. Then you will be able to monitor your BP at home.   -I have included below further information regarding a healthy whole foods based diet, physical activity guidelines for adults, stress management and opportunities for social connections. I hope you find this information useful.   -----------------------------------------------------------------------------------------------------------------------------------------------------------------------------------------------------------------------------------------------------------  NUTRITION: -eat real food: lots of colorful vegetables (half the plate) and fruits -5-7 servings of vegetables and fruits per day (fresh or steamed is best), exp. 2 servings of vegetables with lunch and dinner and 2 servings of fruit per day. Berries and greens such as kale and collards are great choices.  -consume on a regular basis: whole grains (make sure first ingredient on label contains the word "whole"), fresh fruits, fish, nuts, seeds, healthy oils (such as olive oil, avocado oil, grape seed oil) -may eat small amounts of dairy and lean meat on occasion, but avoid processed meats such as ham, bacon, lunch meat, etc. -drink water -try to avoid fast food and pre-packaged foods, processed meat -most experts advise limiting sodium to < '2300mg'$  per day, should limit further is any chronic conditions such as high blood pressure, heart disease, diabetes, etc. The American Heart Association advised that < '1500mg'$  is is ideal -try to avoid foods that contain any ingredients with names  you do not recognize  -try to avoid sugar/sweets (except for the natural sugar that occurs in fresh fruit) -try to avoid sweet drinks -try to avoid white rice, white bread, pasta (unless whole grain), white or yellow potatoes  EXERCISE GUIDELINES FOR ADULTS: -if you wish to increase  your physical activity, do so gradually and with the approval of your doctor -STOP and seek medical care immediately if you have any chest pain, chest discomfort or trouble breathing when starting or increasing exercise  -move and stretch your body, legs, feet and arms when sitting for long periods -Physical activity guidelines for optimal health in adults: -least 150 minutes per week of aerobic exercise (can talk, but not sing) once approved by your doctor, 20-30 minutes of sustained activity or two 10 minute episodes of sustained activity every day.  -resistance training at least 2 days per week if approved by your doctor -balance exercises 3+ days per week:   Stand somewhere where you have something sturdy to hold onto if you lose balance.    1) lift up on toes, start with 5x per day and work up to 20x   2) stand and lift on leg straight out to the side so that foot is a few inches of the floor, start with 5x each side and work up to 20x each side   3) stand on one foot, start with 5 seconds each side and work up to 20 seconds on each side  If you need ideas or help with getting more active:  -Silver sneakers https://tools.silversneakers.com  -Walk with a Doc: http://stephens-thompson.biz/  -try to include resistance (weight lifting/strength building) and balance exercises twice per week: or the following link for ideas: ChessContest.fr  UpdateClothing.com.cy  STRESS MANAGEMENT: -can try meditating, or just sitting quietly with deep breathing while intentionally relaxing all parts of your body for 5 minutes daily -if you need further help with stress, anxiety or depression please follow up with your primary doctor or contact the wonderful folks at [place]: [place]: -options in [place] if you wish to engage in more social and exercise related  activities:  -Silver sneakers https://tools.silversneakers.com  -Walk with a Doc: http://stephens-thompson.biz/  -Check out the [place] 50+ section on the [place] [employer] (hiking clubs, book clubs, cards and games, chess, exercise classes, aquatic classes and much more) - see the website for details: https://www.[place].gov/departments/parks-recreation/active-adults50  -YouTube has lots of exercise videos for different ages and abilities as well  -[place] (a variety of indoor and outdoor inperson activities for adults). [phone]. [address]  -Virtual Online Classes (a variety of topics): see seniorplanet.org or call [phone]  -consider volunteering at a school, hospice center, church, senior center or elsewhere

## 2022-12-30 ENCOUNTER — Ambulatory Visit (INDEPENDENT_AMBULATORY_CARE_PROVIDER_SITE_OTHER): Payer: PPO

## 2022-12-30 DIAGNOSIS — R0609 Other forms of dyspnea: Secondary | ICD-10-CM | POA: Diagnosis not present

## 2022-12-31 LAB — ECHOCARDIOGRAM COMPLETE
Area-P 1/2: 3.03 cm2
S' Lateral: 2.48 cm

## 2023-01-15 ENCOUNTER — Telehealth: Payer: Self-pay | Admitting: Family Medicine

## 2023-01-15 NOTE — Telephone Encounter (Signed)
Pt call and stated dr.Burchette put in for a referral a month ago and she haven't heard anything and want to know what is going on and want a call back.

## 2023-01-18 NOTE — Telephone Encounter (Signed)
Noted  

## 2023-02-04 NOTE — Progress Notes (Signed)
Office Visit    Patient Name: Julie [name] Date of Encounter: 02/04/2023  Primary Care Provider:  Eulas Post, MD Primary Cardiologist:  None Primary Electrophysiologist: None  Chief Complaint    Julie [name] is a 82 y.o. female with PMH of nonobstructive CAD, hypercholesteremia, prediabetes, essential hypertension, GERD, hypothyroidism who presents today for complaint of dyspnea on exertion.  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   Other bursitis disorders    history   Personal history of colonic polyps    PONV (postoperative nausea and vomiting)    Pre-diabetes    Pure hypercholesterolemia    Spondylolisthesis, cervical region    SVD (spontaneous vaginal delivery)    x 1   Unspecified disorder of thyroid    Unspecified hypothyroidism    Past Surgical History:  Procedure Laterality Date   ABDOMINAL HYSTERECTOMY     ANTERIOR CERVICAL DECOMP/DISCECTOMY FUSION N/A 11/11/2017   Procedure: Cervical seven - Thoracic one  Anterior cervical decompression/discectomy/fusion;  Surgeon: Erline Levine, MD;  Location: [facility];  Service: Neurosurgery;  Laterality: N/A;   ANTERIOR LAT LUMBAR FUSION Left 01/22/2017   Procedure: Left Lumbar three-four, Lumbar four-five  Anterior lateral lumbar interbody fusion;  Surgeon: Erline Levine, MD;  Location: [facility];  Service: Neurosurgery;  Laterality: Left;   BACK SURGERY  2002   L4/L5   BLADDER SUSPENSION N/A 03/07/2014   Procedure: TRANSVAGINAL  TAPE (TVT) PROCEDURE WITH CYSTO;  Surgeon: Princess Bruins, MD;  Location: [facility];  Service: Gynecology;  Laterality: N/A;  vaginal    blephorplasty      bilateral    BREAST SURGERY     x 2 breast implants, later removed   COLONOSCOPY     CYSTOCELE REPAIR N/A 03/07/2014   Procedure: ANTERIOR REPAIR (CYSTOCELE);  Surgeon: Princess Bruins, MD;  Location: [facility];  Service: Gynecology;  Laterality: N/A;   devaited septum surgery  1975   EYE SURGERY     bilateral cataract surgery   KNEE ARTHROSCOPY  10/21/2012   right - Procedure: ARTHROSCOPY KNEE;  Surgeon: Mcarthur Rossetti, MD;  Location: [facility];  Service: Orthopedics;  Laterality: Left;  Left Knee Arthroscopy, Debridement, partial synovectomy   LUMBAR LAMINECTOMY     LUMBAR PERCUTANEOUS PEDICLE SCREW 2 LEVEL N/A 01/22/2017   Procedure: Lumbar Percutaneous Pedicle Screw Placement Lumbar three-four, Lumbar four-five;  Surgeon: Erline Levine, MD;  Location: [facility];  Service: Neurosurgery;  Laterality: N/A;   ROBOTIC ASSISTED LAPAROSCOPIC SACROCOLPOPEXY N/A 03/07/2014   Procedure: ROBOTIC ASSISTED LAPAROSCOPIC SACROCOLPOPEXY With Carrolyn Meiers;  Surgeon: Princess Bruins, MD;  Location: [facility];  Service: Gynecology;  Laterality: N/A;   ROBOTIC ASSISTED SUPRACERVICAL HYSTERECTOMY WITH BILATERAL SALPINGO OOPHERECTOMY N/A 03/07/2014   Procedure: ROBOTIC ASSISTED SUPRACERVICAL HYSTERECTOMY WITH BILATERAL SALPINGO OOPHORECTOMY;  Surgeon: Princess Bruins, MD;  Location: [facility];  Service: Gynecology;  Laterality: N/A;  4 hrs.   TONSILLECTOMY  TOTAL KNEE ARTHROPLASTY Left 12/30/2012   Procedure: TOTAL KNEE ARTHROPLASTY;  Surgeon: Kathryne Hitch, MD;  Location: [facility];  Service: Orthopedics;  Laterality: Left;  Left Total Knee Arthroplasty   TUBAL LIGATION     WISDOM TOOTH EXTRACTION      Allergies  Allergies  Allergen Reactions   Penicillins Anaphylaxis    REACTION: causes tongue to swell Has patient had a PCN reaction causing immediate  rash, facial/tongue/throat swelling, SOB or lightheadedness with hypotension: Yes swelling Has patient had a PCN reaction causing severe rash involving mucus membranes or skin necrosis:  Has patient had a PCN reaction that required hospitalization no Has patient had a PCN reaction occurring within the last 10 years: no If all of the above answers are "NO", then may proceed with Cephalosporin use.    Adhesive [Tape] Other (See Comments)    Please use paper tape   Codeine Nausea Only   Lisinopril     REACTION: wheezing   Other Other (See Comments)    Pt was having knee surgery and was given an epidural, which did not work.   Percocet [Oxycodone-Acetaminophen] Nausea And Vomiting    History of Present Illness    Julie [name]  is a 82 year old female with the above mention past medical history who presents today for 1 month complaint of dyspnea on exertion.  Julie [name] was seen previously by Dr. Eldridge Dace in 2021 for complaint of back and neck pain.  She endorsed a complaint of chest heaviness and underwent a cardiac CTA that revealed a calcium score 306 with mixed plaque and moderate (50-69%) stenosis in proximal LAD with noncalcified plaques in proximal RCA (50-69%).  She was sent for a Lexiscan nuclear stress test that was normal and patient was advised to start preventative therapy with statin.  She was seen recently by her PCP Dr. Sander Radon and was sent for 2D echo due to ongoing dyspnea on exertion.  Echo results showed normal EF and no RWMA with no significant regurgitation or stenosis.  Julie [name] presents today for complaint of dyspnea on exertion over the past 2 months.  Since last being seen in the office patient reports that she is experienced shortness of breath with minimal activities such as getting dressed and walking around her home.  She denies any palpitations or chest discomfort with these episodes.  She does note some tachycardia and palpitations that occur and are  associated with this shortness of breath.  She denies any presyncope or prodrome symptoms.  Her blood pressure today was well-controlled at 118/66 and she reports that at home her systolics usually run in the 160s.  She also notes some difficulty thinking clearly over the past month.  I advised her to follow-up with her PCP regarding possible referral to neurologist.  She denies any visual changes or short-term memory loss.  Patient denies chest pain, palpitations, dyspnea, PND, orthopnea, nausea, vomiting, dizziness, syncope, edema, weight gain, or early satiety.   Home Medications    Current Outpatient Medications  Medication Sig Dispense Refill   meloxicam (MOBIC) 15 MG tablet TAKE 1 TABLET(15 MG) BY MOUTH DAILY (Patient not taking: Reported on 12/29/2022) 30 tablet 0   thyroid (NP THYROID) 30 MG tablet TAKE 1 TABLET BY MOUTH EVERY DAY 90 tablet 1   No current facility-administered medications for this visit.     Review of Systems  Please see the history of present illness.    (+) Brain Fog (+) Shortness of breath with  activity  All other systems reviewed and are otherwise negative except as noted above.  Physical Exam    Wt Readings from Last 3 Encounters:  12/29/22 130 lb (59 kg)  12/07/22 130 lb 12.8 oz (59.3 kg)  08/28/22 129 lb 9.6 oz (58.8 kg)   ZO:[id number] were no vitals filed for this visit.,There is no height or weight on file to calculate BMI.  Constitutional:      Appearance: Healthy appearance. Not in distress.  Neck:     Vascular: JVD normal.  Pulmonary:     Effort: Pulmonary effort is normal.     Breath sounds: No wheezing. No rales. Diminished in the bases Cardiovascular:     Normal rate. Regular rhythm. Normal S1. Normal S2.      Murmurs: There is no murmur.  Edema:    Peripheral edema absent.  Abdominal:     Palpations: Abdomen is soft non tender. There is no hepatomegaly.  Skin:    General: Skin is warm and dry.  Neurological:     General: No focal  deficit present.     Mental Status: Alert and oriented to person, place and time.     Cranial Nerves: Cranial nerves are intact.  EKG/LABS/ Recent Cardiac Studies    ECG personally reviewed by me today -none completed today  Risk Assessment/Calculations:    Lab Results  Component Value Date   WBC 8.2 12/07/2022   HGB 13.0 12/07/2022   HCT 38.1 12/07/2022   MCV 90.3 12/07/2022   PLT 244.0 12/07/2022   Lab Results  Component Value Date   CREATININE 0.69 12/07/2022   BUN 18 12/07/2022   NA 141 12/07/2022   K 4.3 12/07/2022   CL 103 12/07/2022   CO2 29 12/07/2022   Lab Results  Component Value Date   ALT 12 12/07/2022   AST 16 12/07/2022   ALKPHOS 67 12/07/2022   BILITOT 0.5 12/07/2022   Lab Results  Component Value Date   CHOL 205 (H) 09/02/2020   HDL 49 09/02/2020   LDLCALC 142 (H) 09/02/2020   LDLDIRECT 158.1 10/30/2013   TRIG 76 09/02/2020   CHOLHDL 4.2 09/02/2020    Lab Results  Component Value Date   HGBA1C 5.6 09/22/2021    Cardiac Studies & Procedures     STRESS TESTS  MYOCARDIAL PERFUSION IMAGING 09/24/2020  Narrative  Nuclear stress EF: 76%.  There was no ST segment deviation noted during stress.  The study is normal.  This is a low risk study.  The left ventricular ejection fraction is hyperdynamic (>65%).  Low risk study, no evidence of ischemia.   ECHOCARDIOGRAM  ECHOCARDIOGRAM COMPLETE 12/31/2022  Narrative ECHOCARDIOGRAM REPORT    Patient Name:   KEARSTEN GINTHER Date of Exam: 12/30/2022 Medical Rec #:  [id number]         Height:       59.0 in Accession #:    [id number]        Weight:       130.0 lb Date of Birth:  Feb 06, 1941        BSA:          1.536 m Patient Age:    81 years          BP:           120/70 mmHg Patient Gender: F                 HR:           84  bpm. Exam Location:  Outpatient  Procedure: 2D Echo, 3D Echo, Cardiac Doppler, Color Doppler and Strain Analysis  Indications:    R06.9 DOE  History:         Patient has no prior history of Echocardiogram examinations. Risk Factors:Hypertension, Dyslipidemia and Non-Smoker. Dyspnea for one month.  Sonographer:    Jeryl Columbia RDCS Referring Phys: [phone] Elberta Fortis [place]  IMPRESSIONS   1. Left ventricular ejection fraction, by estimation, is 60 to 65%. The left ventricle has normal function. The left ventricle has no regional wall motion abnormalities. Left ventricular diastolic parameters are consistent with Grade I diastolic dysfunction (impaired relaxation). 2. Right ventricular systolic function is normal. The right ventricular size is normal. 3. The mitral valve is abnormal. Trivial mitral valve regurgitation. No evidence of mitral stenosis. 4. Nodular calcification of the non coronary cusp. The aortic valve is tricuspid. There is moderate calcification of the aortic valve. There is moderate thickening of the aortic valve. Aortic valve regurgitation is not visualized. Aortic valve sclerosis/calcification is present, without any evidence of aortic stenosis. 5. Aortic dilatation noted. There is mild dilatation of the ascending aorta, measuring 38 mm. 6. The inferior vena cava is normal in size with greater than 50% respiratory variability, suggesting right atrial pressure of 3 mmHg.  FINDINGS Left Ventricle: Left ventricular ejection fraction, by estimation, is 60 to 65%. The left ventricle has normal function. The left ventricle has no regional wall motion abnormalities. The left ventricular internal cavity size was normal in size. There is no left ventricular hypertrophy. Left ventricular diastolic parameters are consistent with Grade I diastolic dysfunction (impaired relaxation).  Right Ventricle: The right ventricular size is normal. No increase in right ventricular wall thickness. Right ventricular systolic function is normal.  Left Atrium: Left atrial size was normal in size.  Right Atrium: Right atrial size was normal in  size.  Pericardium: There is no evidence of pericardial effusion.  Mitral Valve: The mitral valve is abnormal. There is mild thickening of the mitral valve leaflet(s). There is mild calcification of the mitral valve leaflet(s). Trivial mitral valve regurgitation. No evidence of mitral valve stenosis.  Tricuspid Valve: The tricuspid valve is normal in structure. Tricuspid valve regurgitation is trivial. No evidence of tricuspid stenosis.  Aortic Valve: Nodular calcification of the non coronary cusp. The aortic valve is tricuspid. There is moderate calcification of the aortic valve. There is moderate thickening of the aortic valve. Aortic valve regurgitation is not visualized. Aortic valve sclerosis/calcification is present, without any evidence of aortic stenosis.  Pulmonic Valve: The pulmonic valve was normal in structure. Pulmonic valve regurgitation is not visualized. No evidence of pulmonic stenosis.  Aorta: Aortic dilatation noted. There is mild dilatation of the ascending aorta, measuring 38 mm.  Venous: The inferior vena cava is normal in size with greater than 50% respiratory variability, suggesting right atrial pressure of 3 mmHg.  IAS/Shunts: No atrial level shunt detected by color flow Doppler.   LEFT VENTRICLE PLAX 2D LVIDd:         3.31 cm   Diastology LVIDs:         2.48 cm   LV e' medial:    4.35 cm/s LV PW:         1.08 cm   LV E/e' medial:  12.9 LV IVS:        1.00 cm   LV e' lateral:   9.14 cm/s LVOT diam:     1.90 cm   LV E/e' lateral: 6.2 LV  SV:         35 LV SV Index:   23        2D Longitudinal Strain LVOT Area:     2.84 cm  2D Strain GLS Avg:     -18.2 %   RIGHT VENTRICLE RV Basal diam:  4.16 cm RV Mid diam:    3.74 cm RV S prime:     15.10 cm/s TAPSE (M-mode): 2.2 cm  LEFT ATRIUM             Index        RIGHT ATRIUM           Index LA diam:        2.60 cm 1.69 cm/m   RA Area:     12.00 cm LA Vol (A2C):   43.3 ml 28.19 ml/m  RA Volume:   26.80 ml   17.45 ml/m LA Vol (A4C):   20.2 ml 13.15 ml/m LA Biplane Vol: 31.2 ml 20.32 ml/m AORTIC VALVE LVOT Vmax:   66.90 cm/s LVOT Vmean:  43.400 cm/s LVOT VTI:    0.123 m  AORTA Ao Root diam: 3.20 cm Ao Asc diam:  3.80 cm  MITRAL VALVE               TRICUSPID VALVE MV Area (PHT): 3.03 cm    TR Peak grad:   17.6 mmHg MV Decel Time: 250 msec    TR Vmax:        210.00 cm/s MV E velocity: 56.30 cm/s MV A velocity: 82.70 cm/s  SHUNTS MV E/A ratio:  0.68        Systemic VTI:  0.12 m Systemic Diam: 1.90 cm  Charlton Haws MD Electronically signed by Charlton Haws MD Signature Date/Time: 12/31/2022/9:06:47 AM    Final     CT SCANS  CT CORONARY MORPH W/CTA COR W/SCORE 09/04/2020  Addendum 09/04/2020  7:56 PM ADDENDUM REPORT: 09/04/2020 19:54  CLINICAL DATA:  79F with chest pain  EXAM: Cardiac/Coronary CTA  TECHNIQUE: The patient was scanned on a [employer].  FINDINGS: A 100 kV prospective scan was triggered in the descending thoracic aorta at 111 HU's. Axial non-contrast 3 mm slices were carried out through the heart. The data set was analyzed on a dedicated work station and scored using the Agatson method. Gantry rotation speed was 250 msecs and collimation was .6 mm. 0.8 mg of sl NTG was given. The 3D data set was reconstructed in 5% intervals of the 35-75 % of the R-R cycle. Phases were analyzed on a dedicated work station using MPR, MIP and VRT modes. The patient received 80 cc of contrast.  RCA is a large dominant artery that gives rise to PDA and PLA. There is noncalcified plaque in the proximal RCA causing 50-69% stenosis. Calcified plaque in the mid RCA causes 0-24% stenosis  Left main is a large artery that gives rise to LAD and LCX arteries. Step artifact at ostium of left main.  LAD is a large vessel. There is mixed plaque in the proximal LAD that appears to cause 50-69% stenosis. There is a step artifact at this location. Step artifact in distal  LAD.  LCX is a non-dominant artery that gives rise to one large OM1 branch. There is calcified plaque in the proximal to mid LCX causing 0-24% stenosis. There is step artifact in the mid LCX  Other findings:  Left Ventricle: Normal size  Left Atrium: Normal size  Pulmonary Veins: Normal configuration  Right  Ventricle: Normal size  Right Atrium: Normal size  Thoracic aorta: Dilated ascending aorta measuring up to 37mm  Pulmonary Arteries: Normal size  Systemic Veins: Normal drainage  Pericardium: Normal thickness  IMPRESSION: 1. Poor quality study due to step artifacts from respiratory motion during acquisition. There is a significant step artifact at the left main and proximal LAD that makes parts of these arteries uninterpretable. Study unable to be processed for CT FFR due to step artifacts  2. Coronary calcium score of 306. This was 71st percentile for age and sex matched control.  3. Mixed plaque in the proximal LAD appears to cause moderate (50-69%) stenosis, but difficult to assess given step artifact at this location  4. Noncalcified plaque in the proximal RCA causes moderate (50-69%) stenosis  5. In setting of significant step artifacts with at least moderate CAD seen, would recommend further evaluation for ischemia, consider nuclear stress test  CAD-RADS N Non-diagnostic study. Obstructive CAD can't be excluded. Alternative evaluation is recommended.   Electronically Signed By: Epifanio Lesches MD On: 09/04/2020 19:54  Narrative EXAM: OVER-READ INTERPRETATION  CT CHEST  The following report is an over-read performed by radiologist Dr. Trudie Reed of [facility] on 09/03/2020. This over-read does not include interpretation of cardiac or coronary anatomy or pathology. The coronary calcium score/coronary CTA interpretation by the cardiologist is attached.  COMPARISON:  None.  FINDINGS: Aortic atherosclerosis. Within the  visualized portions of the thorax there are no suspicious appearing pulmonary nodules or masses, there is no acute consolidative airspace disease, no pleural effusions, no pneumothorax and no lymphadenopathy. Visualized portions of the upper abdomen are unremarkable. There are no aggressive appearing lytic or blastic lesions noted in the visualized portions of the skeleton.  IMPRESSION: 1.  Aortic Atherosclerosis (ICD10-I70.0).  Electronically Signed: By: Trudie Reed M.D. On: 09/03/2020 12:40          Assessment & Plan    1.  Dyspnea on exertion: -Patient had complaint of dyspnea on exertion and completed 2D echo by PCP that was normal with EF of 60-65% with grade 1 DD and no RWMA with trivial MVR -Today patient reports she continues to experience shortness of breath with simple activities such as walking and getting dressed.  She notes these episodes occurring over the past month. -She completed her last ischemic evaluation in 2021 and we will repeat a nuclear stress test to rule out any possible ischemia or LV dysfunction related to shortness of breath.   2.  Nonobstructive CAD: -Cardiac CTA completed 2021 showing calcium score of 307 and (50-69%) stenosis in proximal LAD with noncalcified plaques in proximal RCA (50-69%) Lexiscan Myoview completed that was normal. -Today patient reports no episodes of chest pain or dizziness. -We discussed the importance of primary prevention and we will start her on a ASA 81 mg today  3.  Hypercholesteremia: -Patient's last LDL cholesterol was completed in 2021. -We will obtain fasting LFTs and lipids -Patient was informed that if lipids are elevated we will possibly initiate statin therapy.  4.  Essential hypertension: -Patient's blood pressure today was well-controlled at 118/66 -She endorses elevated blood pressures at home in the 160s systolically. -She was instructed to check blood pressures at home and report back to our office in  2 weeks. -Pressures remain elevated we may start patient on antihypertensive therapy.  5.  Palpitations: -Patient reports fluttering and palpitations that occur with and without shortness of breath. -She denies any sustained palpitations or dizziness. -We will have  her complete a ZIO monitor for 7 days to rule out for possible arrhythmias  Disposition: Follow-up with None or APP in 2 months Shared Decision Making/Informed Consent The risks [chest pain, shortness of breath, cardiac arrhythmias, dizziness, blood pressure fluctuations, myocardial infarction, stroke/transient ischemic attack, nausea, vomiting, allergic reaction, radiation exposure, metallic taste sensation and life-threatening complications (estimated to be 1 in 10,000)], benefits (risk stratification, diagnosing coronary artery disease, treatment guidance) and alternatives of a nuclear stress test were discussed in detail with Julie [name] and she agrees to proceed.   Medication Adjustments/Labs and Tests Ordered: Current medicines are reviewed at length with the patient today.  Concerns regarding medicines are outlined above.   Signed, Napoleon Form, Leodis Rains, NP 02/04/2023, 9:12 AM [facility]  Note:  This document was prepared using Dragon voice recognition software and may include unintentional dictation errors.

## 2023-02-05 ENCOUNTER — Ambulatory Visit: Payer: PPO | Attending: Nurse Practitioner | Admitting: Nurse Practitioner

## 2023-02-05 ENCOUNTER — Encounter: Payer: Self-pay | Admitting: Nurse Practitioner

## 2023-02-05 ENCOUNTER — Ambulatory Visit: Payer: PPO | Attending: Nurse Practitioner

## 2023-02-05 VITALS — BP 118/66 | HR 71 | Ht 59.0 in | Wt 133.2 lb

## 2023-02-05 DIAGNOSIS — E7800 Pure hypercholesterolemia, unspecified: Secondary | ICD-10-CM | POA: Diagnosis not present

## 2023-02-05 DIAGNOSIS — I2510 Atherosclerotic heart disease of native coronary artery without angina pectoris: Secondary | ICD-10-CM | POA: Diagnosis not present

## 2023-02-05 DIAGNOSIS — R002 Palpitations: Secondary | ICD-10-CM | POA: Diagnosis not present

## 2023-02-05 DIAGNOSIS — R0602 Shortness of breath: Secondary | ICD-10-CM

## 2023-02-05 DIAGNOSIS — I10 Essential (primary) hypertension: Secondary | ICD-10-CM

## 2023-02-05 DIAGNOSIS — R0609 Other forms of dyspnea: Secondary | ICD-10-CM | POA: Diagnosis not present

## 2023-02-05 MED ORDER — ASPIRIN 81 MG PO TBEC: 81.0000 mg | DELAYED_RELEASE_TABLET | Freq: Every day | ORAL | 3 refills | Status: DC

## 2023-02-05 NOTE — Patient Instructions (Addendum)
Medication Instructions:  Your physician has recommended you make the following change in your medication:  START ASPIRIN 81 MG DAILY.  *If you need a refill on your cardiac medications before your next appointment, please call your pharmacy*   Lab Work: TO BE DONE IN 1 WEEK: LFTS, LIPIDS If you have labs (blood work) drawn today and your tests are completely normal, you will receive your results only by: MyChart Message (if you have MyChart) OR A paper copy in the mail If you have any lab test that is abnormal or we need to change your treatment, we will call you to review the results.   Testing/Procedures: Your physician has requested that you have a lexiscan myoview. For further information please visit [email or web address]. Please follow instruction sheet, as given.   Non-Cardiac CT Angiography (CTA), is a special type of CT scan that uses a computer to produce multi-dimensional views of major blood vessels throughout the body. In CT angiography, a contrast material is injected through an IV to help visualize the blood vessels   ZIO XT- Long Term Monitor Instructions  Your physician has requested you wear a ZIO patch monitor for 7 days.  This is a single patch monitor. [employer] supplies one patch monitor per enrollment. Additional stickers are not available. Please do not apply patch if you will be having a Nuclear Stress Test,  Echocardiogram, Cardiac CT, MRI, or Chest Xray during the period you would be wearing the  monitor. The patch cannot be worn during these tests. You cannot remove and re-apply the  ZIO XT patch monitor.  Your ZIO patch monitor will be mailed 3 day USPS to your address on file. It may take 3-5 days  to receive your monitor after you have been enrolled.  Once you have received your monitor, please review the enclosed instructions. Your monitor  has already been registered assigning a specific monitor serial # to you.  Billing and Patient Assistance Program  Information  We have supplied [employer] with any of your insurance information on file for billing purposes. [employer] offers a sliding scale Patient Assistance Program for patients that do not have  insurance, or whose insurance does not completely cover the cost of the ZIO monitor.  You must apply for the Patient Assistance Program to qualify for this discounted rate.  To apply, please call [employer] at [phone], select option 4, select option 2, ask to apply for  Patient Assistance Program. Meredeth [employer] will ask your household income, and how many people  are in your household. They will quote your out-of-pocket cost based on that information.  [employer] will also be able to set up a 4938-month, interest-free payment plan if needed.  Applying the monitor   Shave hair from upper left chest.  Hold abrader disc by orange tab. Rub abrader in 40 strokes over the upper left chest as  indicated in your monitor instructions.  Clean area with 4 enclosed alcohol pads. Let dry.  Apply patch as indicated in monitor instructions. Patch will be placed under collarbone on left  side of chest with arrow pointing upward.  Rub patch adhesive wings for 2 minutes. Remove white label marked "1". Remove the white  label marked "2". Rub patch adhesive wings for 2 additional minutes.  While looking in a mirror, press and release button in center of patch. A small green light will  flash 3-4 times. This will be your only indicator that the monitor has been turned on.  Do not shower for the  first 24 hours. You may shower after the first 24 hours.  Press the button if you feel a symptom. You will hear a small click. Record Date, Time and  Symptom in the Patient Logbook.  When you are ready to remove the patch, follow instructions on the last 2 pages of Patient  Logbook. Stick patch monitor onto the last page of Patient Logbook.  Place Patient Logbook in the blue and white box. Use locking tab on box and tape box closed   securely. The blue and white box has prepaid postage on it. Please place it in the mailbox as  soon as possible. Your physician should have your test results approximately 7 days after the  monitor has been mailed back to [facility].  Call [facility] Customer Care at [phone] if you have questions regarding  your ZIO XT patch monitor. Call them immediately if you see an orange light blinking on your  monitor.  If your monitor falls off in less than 4 days, contact our Monitor department at [phone].  If your monitor becomes loose or falls off after 4 days call [employer] at [phone] for  suggestions on securing your monitor   Follow-Up: At [facility], you and your health needs are our priority.  As part of our continuing mission to provide you with exceptional heart care, we have created designated Provider Care Teams.  These Care Teams include your primary Cardiologist (physician) and Advanced Practice Providers (APPs -  Physician Assistants and Nurse Practitioners) who all work together to provide you with the care you need, when you need it.  We recommend signing up for the patient portal called "MyChart".  Sign up information is provided on this After Visit Summary.  MyChart is used to connect with patients for Virtual Visits (Telemedicine).  Patients are able to view lab/test results, encounter notes, upcoming appointments, etc.  Non-urgent messages can be sent to your provider as well.   To learn more about what you can do with MyChart, go to [email or web address].    Your next appointment:   2 month(s)  Provider:   Robin Searing, JR, NP  Other Instructions Please check your blood pressure 1-2 times per day for 2 weeks and send readings to Robin Searing, NP.

## 2023-02-05 NOTE — Progress Notes (Unsigned)
Applied a 7 day Zio XT monitor to patien in the office  Coleman to read

## 2023-02-09 ENCOUNTER — Ambulatory Visit (HOSPITAL_BASED_OUTPATIENT_CLINIC_OR_DEPARTMENT_OTHER): Payer: PPO

## 2023-02-09 ENCOUNTER — Ambulatory Visit: Payer: PPO | Attending: Nurse Practitioner

## 2023-02-09 DIAGNOSIS — E7800 Pure hypercholesterolemia, unspecified: Secondary | ICD-10-CM | POA: Insufficient documentation

## 2023-02-09 DIAGNOSIS — I2510 Atherosclerotic heart disease of native coronary artery without angina pectoris: Secondary | ICD-10-CM | POA: Diagnosis not present

## 2023-02-09 DIAGNOSIS — R0602 Shortness of breath: Secondary | ICD-10-CM | POA: Diagnosis not present

## 2023-02-09 DIAGNOSIS — I10 Essential (primary) hypertension: Secondary | ICD-10-CM | POA: Diagnosis not present

## 2023-02-09 DIAGNOSIS — R0609 Other forms of dyspnea: Secondary | ICD-10-CM

## 2023-02-09 LAB — MYOCARDIAL PERFUSION IMAGING
LV dias vol: 57 mL (ref 46–106)
LV sys vol: 16 mL
Nuc Stress EF: 72 %
Rest Nuclear Isotope Dose: 9.9 mCi
SRS: 0

## 2023-02-09 MED ORDER — REGADENOSON 0.4 MG/5ML IV SOLN
0.4000 mg | Freq: Once | INTRAVENOUS | Status: AC
Start: 2023-02-09 — End: 2023-02-09
  Administered 2023-02-09: 0.4 mg via INTRAVENOUS

## 2023-02-09 MED ORDER — TECHNETIUM TC 99M TETROFOSMIN IV KIT
9.9000 | PACK | Freq: Once | INTRAVENOUS | Status: AC | PRN
  Administered 2023-02-09: 9.9 via INTRAVENOUS

## 2023-02-09 MED ORDER — TECHNETIUM TC 99M TETROFOSMIN IV KIT
31.1000 | PACK | Freq: Once | INTRAVENOUS | Status: AC | PRN
  Administered 2023-02-09: 31.1 via INTRAVENOUS

## 2023-02-10 ENCOUNTER — Other Ambulatory Visit: Payer: Self-pay

## 2023-02-10 DIAGNOSIS — E7800 Pure hypercholesterolemia, unspecified: Secondary | ICD-10-CM

## 2023-02-10 LAB — LIPID PANEL
Chol/HDL Ratio: 4.7 ratio — ABNORMAL HIGH (ref 0.0–4.4)
Cholesterol, Total: 241 mg/dL — ABNORMAL HIGH (ref 100–199)
HDL: 51 mg/dL (ref >39)
LDL Chol Calc (NIH): 168 mg/dL — ABNORMAL HIGH (ref 0–99)
Triglycerides: 120 mg/dL (ref 0–149)
VLDL Cholesterol Cal: 22 mg/dL (ref 5–40)

## 2023-02-10 LAB — HEPATIC FUNCTION PANEL
ALT: 13 IU/L (ref 0–32)
AST: 18 IU/L (ref 0–40)
Albumin: 4.3 g/dL (ref 3.7–4.7)
Alkaline Phosphatase: 86 IU/L (ref 44–121)
Bilirubin Total: 0.5 mg/dL (ref 0.0–1.2)
Bilirubin, Direct: 0.12 mg/dL (ref 0.00–0.40)
Total Protein: 6.9 g/dL (ref 6.0–8.5)

## 2023-02-10 LAB — MYOCARDIAL PERFUSION IMAGING
Peak HR: 86 {beats}/min
Rest HR: 66 {beats}/min
SDS: 1
SSS: 1
ST Depression (mm): 0 mm
Stress Nuclear Isotope Dose: 31.1 mCi
TID: 1.05

## 2023-02-10 MED ORDER — ATORVASTATIN CALCIUM 20 MG PO TABS: 20.0000 mg | ORAL_TABLET | Freq: Every day | ORAL | 3 refills | Status: DC

## 2023-02-10 NOTE — Addendum Note (Signed)
Addended by: Alveta Heimlich on: 02/10/2023 01:44 PM   Modules accepted: Orders

## 2023-02-15 DIAGNOSIS — R002 Palpitations: Secondary | ICD-10-CM | POA: Diagnosis not present

## 2023-02-16 ENCOUNTER — Telehealth: Payer: Self-pay | Admitting: Interventional Cardiology

## 2023-02-16 MED ORDER — AMLODIPINE BESYLATE 5 MG PO TABS: 5.0000 mg | ORAL_TABLET | Freq: Every day | ORAL | 3 refills | Status: DC

## 2023-02-16 NOTE — Telephone Encounter (Signed)
Patient called reporting elevated blood pressure readings. Pt stated she has experienced blurred vision for a couple of weeks and headaches off and on for weeks. Patient stated she is going out of town tomorrow and will return on 02/23/23.Currently asymptomatic, stated she can't tolerate lisinopril, and explained ED precautions. Patient voiced understanding. Will forward to APP for advise.         Blood pressure  4/16  170/90  4/15 196/101- PM  4/14 153/86    AM         179/101   PM  4/13 164/101   AM          191/100  PM  4/12 188/95      AM         176/104    PM

## 2023-02-16 NOTE — Telephone Encounter (Signed)
Julie [name]., NP  [facility] Triage Caller: Unspecified (Today,  9:12 AM) Please have patient start amlodipine 5 mg daily.  Please advise her to check her blood pressures daily and contact our office if systolic blood pressures are greater than 130 and diastolic pressures are greater than 90.  Please schedule her for a follow-up 1 month and if she continues to experience symptoms she should be seen in the ED for evaluation.  Called and spoke with patient. She agrees to start the amlodipine, sent to pharmacy on file. Wishes to keep current scheduled appt for June and will call us before then if she needs [place].

## 2023-02-16 NOTE — Telephone Encounter (Signed)
Pt c/o BP issue: STAT if pt c/o blurred vision, one-sided weakness or slurred speech  1. What are your last 5 BP readings?  4/16: 196/101 4/17: 170/90  2. Are you having any other symptoms (ex. Dizziness, headache, blurred vision, passed out)?  Headaches   3. What is your BP issue?  BP has been elevated

## 2023-02-23 DIAGNOSIS — D23111 Other benign neoplasm of skin of right upper eyelid, including canthus: Secondary | ICD-10-CM | POA: Diagnosis not present

## 2023-02-23 DIAGNOSIS — D485 Neoplasm of uncertain behavior of skin: Secondary | ICD-10-CM | POA: Diagnosis not present

## 2023-02-23 DIAGNOSIS — Z85828 Personal history of other malignant neoplasm of skin: Secondary | ICD-10-CM | POA: Diagnosis not present

## 2023-02-23 DIAGNOSIS — L821 Other seborrheic keratosis: Secondary | ICD-10-CM | POA: Diagnosis not present

## 2023-03-04 ENCOUNTER — Ambulatory Visit (HOSPITAL_BASED_OUTPATIENT_CLINIC_OR_DEPARTMENT_OTHER)
Admission: RE | Admit: 2023-03-04 | Discharge: 2023-03-04 | Disposition: A | Discharge status: Home / Self Care | Payer: PPO | Source: Ambulatory Visit | Attending: Nurse Practitioner | Admitting: Nurse Practitioner

## 2023-03-04 DIAGNOSIS — R0609 Other forms of dyspnea: Secondary | ICD-10-CM | POA: Insufficient documentation

## 2023-03-04 DIAGNOSIS — I2510 Atherosclerotic heart disease of native coronary artery without angina pectoris: Secondary | ICD-10-CM

## 2023-03-04 DIAGNOSIS — R0602 Shortness of breath: Secondary | ICD-10-CM

## 2023-03-04 DIAGNOSIS — R918 Other nonspecific abnormal finding of lung field: Secondary | ICD-10-CM | POA: Diagnosis not present

## 2023-03-10 ENCOUNTER — Other Ambulatory Visit: Payer: Self-pay

## 2023-03-10 DIAGNOSIS — R0602 Shortness of breath: Secondary | ICD-10-CM

## 2023-03-10 DIAGNOSIS — I2510 Atherosclerotic heart disease of native coronary artery without angina pectoris: Secondary | ICD-10-CM

## 2023-03-15 DIAGNOSIS — K8020 Calculus of gallbladder without cholecystitis without obstruction: Secondary | ICD-10-CM

## 2023-03-15 NOTE — Telephone Encounter (Signed)
Patient is calling to follow up on the referral to the gastrologist Alden Server recommended. Please advise.

## 2023-03-16 ENCOUNTER — Telehealth: Payer: Self-pay | Admitting: Interventional Cardiology

## 2023-03-16 DIAGNOSIS — K8020 Calculus of gallbladder without cholecystitis without obstruction: Secondary | ICD-10-CM

## 2023-03-16 NOTE — Telephone Encounter (Signed)
Patient is requesting call back to discuss MyChart message in regards to referral. She states that she has doctors in mind for the referral and would like a call back to discuss further.

## 2023-03-18 NOTE — Telephone Encounter (Signed)
Left message for the patient to contact the office.  I have attempted to contact the patient multiple times to discuss lab results.

## 2023-03-18 NOTE — Telephone Encounter (Signed)
GI referral has been placed.   Waiting to hear back from Alden Server about general surgery referral.

## 2023-03-19 NOTE — Telephone Encounter (Signed)
General referral addressed in mychart message.

## 2023-03-31 ENCOUNTER — Telehealth: Payer: Self-pay

## 2023-03-31 ENCOUNTER — Encounter: Payer: Self-pay | Admitting: Gastroenterology

## 2023-03-31 NOTE — Telephone Encounter (Signed)
-----   Message from Benancio Deeds, MD sent at 03/31/2023 12:43 PM EDT ----- Regarding: RE: Referral from Dr. Lendell Caprice Can try 6/6 at 1130. I think I also offered this to another patient in recent days with either you or [place] but don't see that it was filled. If it is open can use it. For gallstones. Thanks   ----- Message ----- From: Cooper Render, CMA Sent: 03/31/2023  10:05 AM EDT To: Benancio Deeds, MD Subject: RE: Referral from Dr. Lendell Caprice                 Your next clinic opening is 7-31 or 8-1 (hem banding spot).  Ok to put her there? What is she being seen for? Thanks, Jan   ----- Message ----- From: Benancio Deeds, MD Sent: 03/30/2023   6:26 PM EDT To: Cooper Render, CMA; Missy Sabins, RN Subject: FW: Referral from Dr. Lendell Caprice                 Can you please put this patient into any openings I may have come up in the next few weeks? I'm curious how long is my wait list for clinic patient's right now? I can try to overbook but running out of options to do that. Try to save some spots for more urgent people.   Thanks   ----- Message ----- From: Crecencio Mc Sent: 03/30/2023   4:20 PM EDT To: Benancio Deeds, MD Subject: Referral from Dr. Selinda Eon afternoon Dr. Adela Lank,   We received a call from Dr. Higinio Plan, he stated he wanted a message sent to you in regards to a patient being referred to you. I have attached the patient to this message. Dr. Lendell Caprice stated that you would know what he was referring to, he did not discuss anything further.    Thank you.

## 2023-03-31 NOTE — Telephone Encounter (Signed)
Called patient. Offered her June 6th at 11:30 am. She is available that day. Understands to come 15 mins early for registration.  Patient has been mailed new patient paperwork with appointment information

## 2023-04-02 ENCOUNTER — Other Ambulatory Visit: Payer: Self-pay | Admitting: Family Medicine

## 2023-04-05 NOTE — Progress Notes (Unsigned)
Office Visit    Patient Name: Julie [name] Date of Encounter: 04/05/2023  Primary Care Provider:  Kristian Covey, MD Primary Cardiologist:  None Primary Electrophysiologist: None   Past Medical History    Past Medical History:  Diagnosis Date   Arthritis    lower back shoulder   Arthropathy, unspecified, site unspecified    Cancer (HCC)    skin -spots on face removed   Contact dermatitis and other eczema, due to unspecified cause    History   Depression    GERD (gastroesophageal reflux disease)    history - no current prob - no med   Hypertension    was taking lisinopril but had an allergic reaction to medicaion   Neuromuscular disorder (HCC)    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   Other bursitis disorders    history   Personal history of colonic polyps    PONV (postoperative nausea and vomiting)    Pre-diabetes    Pure hypercholesterolemia    Spondylolisthesis, cervical region    SVD (spontaneous vaginal delivery)    x 1   Unspecified disorder of thyroid    Unspecified hypothyroidism    Past Surgical History:  Procedure Laterality Date   ABDOMINAL HYSTERECTOMY     ANTERIOR CERVICAL DECOMP/DISCECTOMY FUSION N/A 11/11/2017   Procedure: Cervical seven - Thoracic one  Anterior cervical decompression/discectomy/fusion;  Surgeon: Maeola Harman, MD;  Location: [facility] OR;  Service: Neurosurgery;  Laterality: N/A;   ANTERIOR LAT LUMBAR FUSION Left 01/22/2017   Procedure: Left Lumbar three-four, Lumbar four-five  Anterior lateral lumbar interbody fusion;  Surgeon: Maeola Harman, MD;  Location: [facility] OR;  Service: Neurosurgery;  Laterality: Left;   BACK SURGERY  2002   L4/L5   BLADDER SUSPENSION N/A 03/07/2014   Procedure: TRANSVAGINAL TAPE (TVT) PROCEDURE WITH CYSTO;  Surgeon: Genia Del, MD;  Location: [facility] ORS;  Service: Gynecology;  Laterality: N/A;  vaginal    blephorplasty      bilateral    BREAST SURGERY     x 2 breast implants, later removed    COLONOSCOPY     CYSTOCELE REPAIR N/A 03/07/2014   Procedure: ANTERIOR REPAIR (CYSTOCELE);  Surgeon: Genia Del, MD;  Location: [facility] ORS;  Service: Gynecology;  Laterality: N/A;   devaited septum surgery  1975   EYE SURGERY     bilateral cataract surgery   KNEE ARTHROSCOPY  10/21/2012   right - Procedure: ARTHROSCOPY KNEE;  Surgeon: Kathryne Hitch, MD;  Location: [facility] ORS;  Service: Orthopedics;  Laterality: Left;  Left Knee Arthroscopy, Debridement, partial synovectomy   LUMBAR LAMINECTOMY     LUMBAR PERCUTANEOUS PEDICLE SCREW 2 LEVEL N/A 01/22/2017   Procedure: Lumbar Percutaneous Pedicle Screw Placement Lumbar three-four, Lumbar four-five;  Surgeon: Maeola Harman, MD;  Location: [facility] OR;  Service: Neurosurgery;  Laterality: N/A;   ROBOTIC ASSISTED LAPAROSCOPIC SACROCOLPOPEXY N/A 03/07/2014   Procedure: ROBOTIC ASSISTED LAPAROSCOPIC SACROCOLPOPEXY With Valli Glance;  Surgeon: Genia Del, MD;  Location: [facility] OR[facility];  Service: Gynecology;  Laterality: N/A;   ROBOTIC ASSISTED SUPRACERVICAL HYSTERECTOMY WITH BILATERAL SALPINGO OOPHERECTOMY N/A 03/07/2014   Procedure: ROBOTIC ASSISTED SUPRACERVICAL HYSTERECTOMY WITH BILATERAL SALPINGO OOPHORECTOMY;  Surgeon: Genia Del, MD;  Location: [facility] OR[facility];  Service: Gynecology;  Laterality: N/A;  4 hrs.   TONSILLECTOMY     TOTAL KNEE ARTHROPLASTY Left 12/30/2012   Procedure: TOTAL KNEE ARTHROPLASTY;  Surgeon: Kathryne Hitch, MD;  Location: [facility] OR[facility];  Service: Orthopedics;  Laterality: Left;  Left  Total Knee Arthroplasty   TUBAL LIGATION     WISDOM TOOTH EXTRACTION      Allergies  Allergies  Allergen Reactions   Penicillins Anaphylaxis    REACTION: causes tongue to swell Has patient had a PCN reaction causing immediate rash, facial/tongue/throat swelling, SOB or lightheadedness with hypotension: Yes swelling Has patient had a PCN reaction causing severe rash involving mucus membranes or skin necrosis:  Has patient had a PCN reaction that  required hospitalization no Has patient had a PCN reaction occurring within the last 10 years: no If all of the above answers are "NO", then may proceed with Cephalosporin use.    Adhesive [Tape] Other (See Comments)    Please use paper tape   Codeine Nausea Only   Lisinopril     REACTION: wheezing   Other Other (See Comments)    Pt was having knee surgery and was given an epidural, which did not work.   Percocet [Oxycodone-Acetaminophen] Nausea And Vomiting     History of Present Illness    Julie [name] is a 82 y.o. female with PMH of nonobstructive CAD, hypercholesteremia, prediabetes, essential hypertension, GERD, hypothyroidism who presents today for 71-month follow-up of dyspnea on exertion.  Julie [name] was seen previously by Dr. Eldridge Dace in 2021 for complaint of back and neck pain.  She endorsed a complaint of chest heaviness and underwent a cardiac CTA that revealed a calcium score 306 with mixed plaque and moderate (50-69%) stenosis in proximal LAD with noncalcified plaques in proximal RCA (50-69%).  She was sent for a Lexiscan nuclear stress test that was normal and patient was advised to start preventative therapy with statin.  She was seen recently by her PCP Dr. Sander Radon and was sent for 2D echo due to ongoing dyspnea on exertion.  She also reported palpitations that were nonsustained.  Echo results showed normal EF and no RWMA with no significant regurgitation or stenosis.  She was seen in follow-up 02/05/2023 with complaint of dyspnea on exertion over the previous 2 months.  She was sent for nuclear stress test that was normal and low risk.  She wore an event monitor that showed predominantly sinus rhythm with brief runs of PACs and rare PVCs.  Julie [name] presents today for 1 month follow-up alone.  Since last being seen in the office patient reports that her palpitations have resolved however she is still experiencing shortness of breath with and without exertion.  Her  blood pressure today is 138/72 and blood pressures at home have been in the 130s to 160s over 70s and 80s.  She reports compliance with her current medications and denies any adverse reactions.  During today's visit we reviewed the results of her event monitor and CT of the chest.  We also reviewed her Myoview results on the patient and all questions answered to her satisfaction.  She is scheduled to be seen by Dr. Adela Lank in the next few weeks to discuss cholelithiasis management.  Patient denies chest pain, palpitations, dyspnea, PND, orthopnea, nausea, vomiting, dizziness, syncope, edema, weight gain, or early satiety.  Home Medications    Current Outpatient Medications  Medication Sig Dispense Refill   atorvastatin (LIPITOR) 20 MG tablet Take 1 tablet (20 mg total) by mouth daily. 30 tablet 3   amLODipine (NORVASC) 5 MG tablet Take 1 tablet (5 mg total) by mouth daily. 90 tablet 3   aspirin EC 81 MG tablet Take 1 tablet (81 mg total) by mouth daily. Swallow whole. 90 tablet 3  gabapentin (NEURONTIN) 100 MG capsule Take 100 mg by mouth at bedtime.     thyroid (NP THYROID) 30 MG tablet TAKE 1 TABLET BY MOUTH EVERY DAY 90 tablet 1   No current facility-administered medications for this visit.     Review of Systems  Please see the history of present illness.    (+) Shortness of breath (+) Right lower back pain  All other systems reviewed and are otherwise negative except as noted above.  Physical Exam    Wt Readings from Last 3 Encounters:  02/09/23 133 lb (60.3 kg)  02/05/23 133 lb 3.2 oz (60.4 kg)  12/29/22 130 lb (59 kg)   WU:JWJXB were no vitals filed for this visit.,There is no height or weight on file to calculate BMI.  Constitutional:      Appearance: Healthy appearance. Not in distress.  Neck:     Vascular: JVD normal.  Pulmonary:     Effort: Pulmonary effort is normal.     Breath sounds: No wheezing. No rales. Diminished in the bases Cardiovascular:     Normal  rate. Regular rhythm. Normal S1. Normal S2.      Murmurs: There is no murmur.  Edema:    Peripheral edema absent.  Abdominal:     Palpations: Abdomen is soft non tender. There is no hepatomegaly.  Skin:    General: Skin is warm and dry.  Neurological:     General: No focal deficit present.     Mental Status: Alert and oriented to person, place and time.     Cranial Nerves: Cranial nerves are intact.  EKG/LABS/ Recent Cardiac Studies    ECG personally reviewed by me today -none completed today  Cardiac Studies & Procedures     STRESS TESTS  MYOCARDIAL PERFUSION IMAGING 02/10/2023  Narrative   The study is normal. The study is low risk.   No ST deviation was noted.   LV perfusion is normal. There is no evidence of ischemia. There is no evidence of infarction.   Left ventricular function is normal. Nuclear stress EF: 72 %. The left ventricular ejection fraction is hyperdynamic (>65%). End diastolic cavity size is normal. End systolic cavity size is normal.   Prior study available for comparison from 09/24/2020.   ECHOCARDIOGRAM  ECHOCARDIOGRAM COMPLETE 12/31/2022  Narrative ECHOCARDIOGRAM REPORT    Patient Name:   AIREN GOLDRICK Date of Exam: 12/30/2022 Medical Rec #:  [id number]         Height:       59.0 in Accession #:    [id number]        Weight:       130.0 lb Date of Birth:  10-14-1941        BSA:          1.536 m Patient Age:    81 years          BP:           120/70 mmHg Patient Gender: F                 HR:           84 bpm. Exam Location:  Outpatient  Procedure: 2D Echo, 3D Echo, Cardiac Doppler, Color Doppler and Strain Analysis  Indications:    R06.9 DOE  History:        Patient has no prior history of Echocardiogram examinations. Risk Factors:Hypertension, Dyslipidemia and Non-Smoker. Dyspnea for one month.  Sonographer:    Jeryl Columbia RDCS Referring Phys:  [id number]  IMPRESSIONS   1. Left ventricular ejection fraction, by  estimation, is 60 to 65%. The left ventricle has normal function. The left ventricle has no regional wall motion abnormalities. Left ventricular diastolic parameters are consistent with Grade I diastolic dysfunction (impaired relaxation). 2. Right ventricular systolic function is normal. The right ventricular size is normal. 3. The mitral valve is abnormal. Trivial mitral valve regurgitation. No evidence of mitral stenosis. 4. Nodular calcification of the non coronary cusp. The aortic valve is tricuspid. There is moderate calcification of the aortic valve. There is moderate thickening of the aortic valve. Aortic valve regurgitation is not visualized. Aortic valve sclerosis/calcification is present, without any evidence of aortic stenosis. 5. Aortic dilatation noted. There is mild dilatation of the ascending aorta, measuring 38 mm. 6. The inferior vena cava is normal in size with greater than 50% respiratory variability, suggesting right atrial pressure of 3 mmHg.  FINDINGS Left Ventricle: Left ventricular ejection fraction, by estimation, is 60 to 65%. The left ventricle has normal function. The left ventricle has no regional wall motion abnormalities. The left ventricular internal cavity size was normal in size. There is no left ventricular hypertrophy. Left ventricular diastolic parameters are consistent with Grade I diastolic dysfunction (impaired relaxation).  Right Ventricle: The right ventricular size is normal. No increase in right ventricular wall thickness. Right ventricular systolic function is normal.  Left Atrium: Left atrial size was normal in size.  Right Atrium: Right atrial size was normal in size.  Pericardium: There is no evidence of pericardial effusion.  Mitral Valve: The mitral valve is abnormal. There is mild thickening of the mitral valve leaflet(s). There is mild calcification of the mitral valve leaflet(s). Trivial mitral valve regurgitation. No evidence of mitral valve  stenosis.  Tricuspid Valve: The tricuspid valve is normal in structure. Tricuspid valve regurgitation is trivial. No evidence of tricuspid stenosis.  Aortic Valve: Nodular calcification of the non coronary cusp. The aortic valve is tricuspid. There is moderate calcification of the aortic valve. There is moderate thickening of the aortic valve. Aortic valve regurgitation is not visualized. Aortic valve sclerosis/calcification is present, without any evidence of aortic stenosis.  Pulmonic Valve: The pulmonic valve was normal in structure. Pulmonic valve regurgitation is not visualized. No evidence of pulmonic stenosis.  Aorta: Aortic dilatation noted. There is mild dilatation of the ascending aorta, measuring 38 mm.  Venous: The inferior vena cava is normal in size with greater than 50% respiratory variability, suggesting right atrial pressure of 3 mmHg.  IAS/Shunts: No atrial level shunt detected by color flow Doppler.   LEFT VENTRICLE PLAX 2D LVIDd:         3.31 cm   Diastology LVIDs:         2.48 cm   LV e' medial:    4.35 cm/s LV PW:         1.08 cm   LV E/e' medial:  12.9 LV IVS:        1.00 cm   LV e' lateral:   9.14 cm/s LVOT diam:     1.90 cm   LV E/e' lateral: 6.2 LV SV:         35 LV SV Index:   23        2D Longitudinal Strain LVOT Area:     2.84 cm  2D Strain GLS Avg:     -18.2 %   RIGHT VENTRICLE RV Basal diam:  4.16 cm RV Mid diam:  3.74 cm RV S prime:     15.10 cm/s TAPSE (M-mode): 2.2 cm  LEFT ATRIUM             Index        RIGHT ATRIUM           Index LA diam:        2.60 cm 1.69 cm/m   RA Area:     12.00 cm LA Vol (A2C):   43.3 ml 28.19 ml/m  RA Volume:   26.80 ml  17.45 ml/m LA Vol (A4C):   20.2 ml 13.15 ml/m LA Biplane Vol: 31.2 ml 20.32 ml/m AORTIC VALVE LVOT Vmax:   66.90 cm/s LVOT Vmean:  43.400 cm/s LVOT VTI:    0.123 m  AORTA Ao Root diam: 3.20 cm Ao Asc diam:  3.80 cm  MITRAL VALVE               TRICUSPID VALVE MV Area (PHT): 3.03 cm     TR Peak grad:   17.6 mmHg MV Decel Time: 250 msec    TR Vmax:        210.00 cm/s MV E velocity: 56.30 cm/s MV A velocity: 82.70 cm/s  SHUNTS MV E/A ratio:  0.68        Systemic VTI:  0.12 m Systemic Diam: 1.90 cm  Charlton Haws MD Electronically signed by Charlton Haws MD Signature Date/Time: 12/31/2022/9:06:47 AM    Final    MONITORS  LONG TERM MONITOR (3-14 DAYS) 02/22/2023  Narrative   Normal sinus rhythm with rare PACs and rare PVCs.   Rare brief runs of PACs, longest was 7 beats.   No atrial fibrillation.   Patient symptoms correlated to PACs.   Patch Wear Time:  7 days and 7 hours (2024-04-05T12:09:48-0400 to 2024-04-12T20:06:36-0400)  Patient had a min HR of 53 bpm, max HR of 158 bpm, and avg HR of 69 bpm. Predominant underlying rhythm was Sinus Rhythm. First Degree AV Block was present. 29 Supraventricular Tachycardia runs occurred, the run with the fastest interval lasting 4 beats with a max rate of 158 bpm, the longest lasting 7 beats with an avg rate of 118 bpm. Isolated SVEs were rare (<1.0%), SVE Couplets were rare (<1.0%), and SVE Triplets were rare (<1.0%). Isolated VEs were rare (<1.0%), VE Couplets were rare (<1.0%), and no VE Triplets were present. Ventricular Bigeminy was present.   CT SCANS  CT CORONARY MORPH W/CTA COR W/SCORE 09/04/2020  Addendum 09/04/2020  7:56 PM ADDENDUM REPORT: 09/04/2020 19:54  CLINICAL DATA:  35F with chest pain  EXAM: Cardiac/Coronary CTA  TECHNIQUE: The patient was scanned on a [employer].  FINDINGS: A 100 kV prospective scan was triggered in the descending thoracic aorta at 111 HU's. Axial non-contrast 3 mm slices were carried out through the heart. The data set was analyzed on a dedicated work station and scored using the Agatson method. Gantry rotation speed was 250 msecs and collimation was .6 mm. 0.8 mg of sl NTG was given. The 3D data set was reconstructed in 5% intervals of the 35-75 % of the R-R  cycle. Phases were analyzed on a dedicated work station using MPR, MIP and VRT modes. The patient received 80 cc of contrast.  RCA is a large dominant artery that gives rise to PDA and PLA. There is noncalcified plaque in the proximal RCA causing 50-69% stenosis. Calcified plaque in the mid RCA causes 0-24% stenosis  Left main is a large artery that gives rise to  LAD and LCX arteries. Step artifact at ostium of left main.  LAD is a large vessel. There is mixed plaque in the proximal LAD that appears to cause 50-69% stenosis. There is a step artifact at this location. Step artifact in distal LAD.  LCX is a non-dominant artery that gives rise to one large OM1 branch. There is calcified plaque in the proximal to mid LCX causing 0-24% stenosis. There is step artifact in the mid LCX  Other findings:  Left Ventricle: Normal size  Left Atrium: Normal size  Pulmonary Veins: Normal configuration  Right Ventricle: Normal size  Right Atrium: Normal size  Thoracic aorta: Dilated ascending aorta measuring up to 37mm  Pulmonary Arteries: Normal size  Systemic Veins: Normal drainage  Pericardium: Normal thickness  IMPRESSION: 1. Poor quality study due to step artifacts from respiratory motion during acquisition. There is a significant step artifact at the left main and proximal LAD that makes parts of these arteries uninterpretable. Study unable to be processed for CT FFR due to step artifacts  2. Coronary calcium score of 306. This was 71st percentile for age and sex matched control.  3. Mixed plaque in the proximal LAD appears to cause moderate (50-69%) stenosis, but difficult to assess given step artifact at this location  4. Noncalcified plaque in the proximal RCA causes moderate (50-69%) stenosis  5. In setting of significant step artifacts with at least moderate CAD seen, would recommend further evaluation for ischemia, consider nuclear stress test  CAD-RADS N  Non-diagnostic study. Obstructive CAD can't be excluded. Alternative evaluation is recommended.   Electronically Signed By: Epifanio Lesches MD On: 09/04/2020 19:54  Narrative EXAM: OVER-READ INTERPRETATION  CT CHEST  The following report is an over-read performed by radiologist Dr. Trudie Reed of [facility] on 09/03/2020. This over-read does not include interpretation of cardiac or coronary anatomy or pathology. The coronary calcium score/coronary CTA interpretation by the cardiologist is attached.  COMPARISON:  None.  FINDINGS: Aortic atherosclerosis. Within the visualized portions of the thorax there are no suspicious appearing pulmonary nodules or masses, there is no acute consolidative airspace disease, no pleural effusions, no pneumothorax and no lymphadenopathy. Visualized portions of the upper abdomen are unremarkable. There are no aggressive appearing lytic or blastic lesions noted in the visualized portions of the skeleton.  IMPRESSION: 1.  Aortic Atherosclerosis (ICD10-I70.0).  Electronically Signed: By: Trudie Reed M.D. On: 09/03/2020 12:40          Lab Results  Component Value Date   WBC 8.2 12/07/2022   HGB 13.0 12/07/2022   HCT 38.1 12/07/2022   MCV 90.3 12/07/2022   PLT 244.0 12/07/2022   Lab Results  Component Value Date   CREATININE 0.69 12/07/2022   BUN 18 12/07/2022   NA 141 12/07/2022   K 4.3 12/07/2022   CL 103 12/07/2022   CO2 29 12/07/2022   Lab Results  Component Value Date   ALT 13 02/09/2023   AST 18 02/09/2023   ALKPHOS 86 02/09/2023   BILITOT 0.5 02/09/2023   Lab Results  Component Value Date   CHOL 241 (H) 02/09/2023   HDL 51 02/09/2023   LDLCALC 168 (H) 02/09/2023   LDLDIRECT 158.1 10/30/2013   TRIG 120 02/09/2023   CHOLHDL 4.7 (H) 02/09/2023    Lab Results  Component Value Date   HGBA1C 5.6 09/22/2021     Assessment & Plan    1.  Dyspnea on exertion: -Patient had complaint of  dyspnea on exertion and  completed 2D echo by PCP that was normal with EF of 60-65% with grade 1 DD and no RWMA with trivial MVR -Today patient reports she continues to experience shortness of breath with simple activities such as walking and getting dressed.  She notes these episodes occurring over the past month. -We will refer patient to pulmonology for PFTs and evaluation. -Patient will try albuterol inhaler as needed   2.  Nonobstructive CAD: -Cardiac CTA completed 2021 showing calcium score of 307 and (50-69%) stenosis in proximal LAD with noncalcified plaques in proximal RCA (50-69%) Lexiscan Myoview completed that was normal. -Patient had exercise Myoview completed due to shortness of breath and chest discomfort.   3.  Hypercholesteremia: -Patient's LDL cholesterol was 168 -She was started on Lipitor 20 mg daily -We will repeat LFTs and lipids in 3 months   4.  Essential hypertension: -Patient's blood pressure today was controlled at 138/72 -She endorses elevated blood pressures at home in the 160s systolically. -We will increase amlodipine to 10 mg daily and patient will monitor blood pressures for the next 2 weeks and report back to our office.   5.  Palpitations: -Patient reports no further palpitations and has been asymptomatic  Disposition: Follow-up with None or APP in 3 months    Medication Adjustments/Labs and Tests Ordered: Current medicines are reviewed at length with the patient today.  Concerns regarding medicines are outlined above.   Signed, Napoleon Form, Leodis Rains, NP 04/05/2023, 12:04 PM [facility]

## 2023-04-06 ENCOUNTER — Ambulatory Visit: Payer: PPO | Attending: Nurse Practitioner | Admitting: Nurse Practitioner

## 2023-04-06 ENCOUNTER — Encounter: Payer: Self-pay | Admitting: Nurse Practitioner

## 2023-04-06 VITALS — BP 138/72 | HR 70 | Ht 59.5 in | Wt 135.0 lb

## 2023-04-06 DIAGNOSIS — R002 Palpitations: Secondary | ICD-10-CM

## 2023-04-06 DIAGNOSIS — R0609 Other forms of dyspnea: Secondary | ICD-10-CM

## 2023-04-06 DIAGNOSIS — I2510 Atherosclerotic heart disease of native coronary artery without angina pectoris: Secondary | ICD-10-CM | POA: Diagnosis not present

## 2023-04-06 DIAGNOSIS — E785 Hyperlipidemia, unspecified: Secondary | ICD-10-CM | POA: Diagnosis not present

## 2023-04-06 DIAGNOSIS — I10 Essential (primary) hypertension: Secondary | ICD-10-CM | POA: Diagnosis not present

## 2023-04-06 MED ORDER — AMLODIPINE BESYLATE 10 MG PO TABS: 10.0000 mg | ORAL_TABLET | Freq: Every day | ORAL | 0 refills | Status: DC

## 2023-04-06 MED ORDER — ALBUTEROL SULFATE HFA 108 (90 BASE) MCG/ACT IN AERS: 2.0000 | INHALATION_SPRAY | Freq: Four times a day (QID) | RESPIRATORY_TRACT | 0 refills | Status: DC | PRN

## 2023-04-06 NOTE — Patient Instructions (Signed)
Medication Instructions:  INCREASE Norvasc to 10mg  Take 1 tablet once day START Albuterol take as needed *If you need a refill on your cardiac medications before your next appointment, please call your pharmacy*   Lab Work: None ordered If you have labs (blood work) drawn today and your tests are completely normal, you will receive your results only by: MyChart Message (if you have MyChart) OR A paper copy in the mail If you have any lab test that is abnormal or we need to change your treatment, we will call you to review the results.   Testing/Procedures: None ordered   Follow-Up: At [facility], you and your health needs are our priority.  As part of our continuing mission to provide you with exceptional heart care, we have created designated Provider Care Teams.  These Care Teams include your primary Cardiologist (physician) and Advanced Practice Providers (APPs -  Physician Assistants and Nurse Practitioners) who all work together to provide you with the care you need, when you need it.  We recommend signing up for the patient portal called "MyChart".  Sign up information is provided on this After Visit Summary.  MyChart is used to connect with patients for Virtual Visits (Telemedicine).  Patients are able to view lab/test results, encounter notes, upcoming appointments, etc.  Non-urgent messages can be sent to your provider as well.   To learn more about what you can do with MyChart, go to [email or web address].    Your next appointment:   3 month(s)  Provider:   Robin Searing, NP       Other Instructions You have been referred to Pulmonology Check your blood pressure daily for 2 weeks, then contact the office with your readings.

## 2023-04-08 ENCOUNTER — Ambulatory Visit: Payer: PPO | Admitting: Gastroenterology

## 2023-04-08 ENCOUNTER — Encounter: Payer: Self-pay | Admitting: Gastroenterology

## 2023-04-08 VITALS — BP 120/70 | HR 80 | Ht 58.5 in | Wt 136.1 lb

## 2023-04-08 DIAGNOSIS — M545 Low back pain, unspecified: Secondary | ICD-10-CM

## 2023-04-08 DIAGNOSIS — G8929 Other chronic pain: Secondary | ICD-10-CM

## 2023-04-08 DIAGNOSIS — K8020 Calculus of gallbladder without cholecystitis without obstruction: Secondary | ICD-10-CM | POA: Diagnosis not present

## 2023-04-08 NOTE — Progress Notes (Signed)
HPI :  82 year old female with history of back pain and multiple back surgeries, gallstones, dyspnea, here for new patient evaluation for an opinion on her symptoms of pain and imaging showing gallstones.  She reports starting this past November she had a chronic persistent pain in her right lower back.  It appears in her mid right lower back and does not radiate anywhere, no sciatica.  Pain is there constantly and never really goes away, describes it as an aching or burning.  She denies any pain in her abdomen.  She is able to eat okay, this does not reproduce her pains.  No nausea or vomiting.  She states she may occasionally have a twitch in her right upper quadrant but nothing significant.  She states with any activity makes her pain worse in her back such as walking or standing.  She has occasional rare reflux but nothing significant.  She does not really take anything for it.  No changes in her bowels.  No loose stools.  Has had constipation in the past.  Rare blood on the toilet paper with wiping likely from hemorrhoids.  Her last colonoscopy was in 2010 and was normal.  She has not had any weight loss, in fact has endorsed weight gain.  She states he has seen neurosurgery in the past for back pain, has seen physical therapy in recent months and that has somewhat helped her pain.  Her symptoms persist however and she has not been very active because of it.  She otherwise has been seeing cardiology for persistent dyspnea on exertion.  She had a pretty extensive evaluation with them to include an echocardiogram, stress test, Holter monitor, they did not find a clear cause for her symptoms.  She had a CT scan of her chest done on May 7 which showed some findings as outlined below.  She was referred here for the findings of gallstones in her gallbladder in relation to her symptoms.  She states she is never been told she had gallstones before.  I do not see any imaging on file of her abdomen past.  She  states she had a lumbar MRI in the past several months with her neurosurgeon but I do not see that on file today.  She was most recently referred to pulmonary for PFTs given lack of clear cause for her dyspnea otherwise.  She had LFTs in April which were normal.  Prior workup: Colonoscopy 06/2009: diverticulosis, otherwise normal exam  CT chest 03/09/23: IMPRESSION: 1. Tiny scattered pulmonary nodules, largest 0.3 cm in the left lower lobe, not definitely seen on 09/03/2020 coronary CT. Recommend attention on follow-up noncontrast chest CT in 12 months given patient risk factors. 2. 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 Recommend annual imaging followup by CTA or MRA. This recommendation follows 2010 ACCF/AHA/AATS/ACR/ASA/SCA/SCAI/SIR/STS/SVM Guidelines for the Diagnosis and Management of Patients with Thoracic Aortic Disease. Circulation. 2010; 121: Z610-R604. Aortic aneurysm NOS (ICD10-I71.9). 3. Three-vessel coronary atherosclerosis. 4. Cholelithiasis. 5.  Aortic Atherosclerosis (ICD10-I70.0).    Echo 12/30/22 - EF 60-65%, grade I DD  Nuclear stress 02/09/23: The study is normal. The study is low risk.   No ST deviation was noted.   LV perfusion is normal. There is no evidence of ischemia. There is no evidence of infarction.   Left ventricular function is normal. Nuclear stress EF: 72 %. The left ventricular ejection fraction is hyperdynamic (>65%). End diastolic cavity size is normal. End systolic cavity size is normal.   Prior study available  for comparison from 09/24/2020.  Holter 02/16/23: Normal sinus rhythm with rare PACs and rare PVCs.   Rare brief runs of PACs, longest was 7 beats.   No atrial fibrillation.   Patient symptoms correlated to PACs.  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   Other bursitis disorders    history   Personal history of colonic polyps    PONV (postoperative nausea and vomiting)    Pre-diabetes    Pure hypercholesterolemia    Spondylolisthesis, cervical region    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    skin -spots on face, forehead and leg removed   SVD (spontaneous vaginal delivery)    x 1   Unspecified disorder of thyroid    Unspecified hypothyroidism      Past Surgical History:  Procedure Laterality Date   ABDOMINAL HYSTERECTOMY     ANTERIOR CERVICAL DECOMP/DISCECTOMY FUSION N/A 11/11/2017   Procedure: Cervical seven - Thoracic one  Anterior cervical decompression/discectomy/fusion;  Surgeon: Maeola Harman, MD;  Location: [facility] OR;  Service: Neurosurgery;  Laterality: N/A;   ANTERIOR LAT LUMBAR FUSION Left 01/22/2017   Procedure: Left Lumbar three-four, Lumbar four-five  Anterior lateral lumbar interbody fusion;  Surgeon: Maeola Harman, MD;  Location: [facility] OR;  Service: Neurosurgery;  Laterality: Left;   BACK SURGERY  2002   L4/L5   BLADDER SUSPENSION N/A 03/07/2014   Procedure: TRANSVAGINAL TAPE (TVT) PROCEDURE WITH CYSTO;  Surgeon: Genia Del, MD;  Location: [facility] ORS;  Service: Gynecology;  Laterality: N/A;  vaginal    blephorplasty      bilateral    BREAST SURGERY     x 2 breast implants, later removed   COLONOSCOPY     CYSTOCELE REPAIR N/A 03/07/2014   Procedure: ANTERIOR REPAIR (CYSTOCELE);  Surgeon: Genia Del, MD;  Location: [facility] ORS;  Service: Gynecology;  Laterality: N/A;   devaited septum surgery  1975   EYE SURGERY     bilateral cataract surgery   KNEE ARTHROSCOPY  10/21/2012   right - Procedure: ARTHROSCOPY KNEE;   Surgeon: Kathryne Hitch, MD;  Location: [facility] ORS;  Service: Orthopedics;  Laterality: Left;  Left Knee Arthroscopy, Debridement, partial synovectomy   LUMBAR LAMINECTOMY     LUMBAR PERCUTANEOUS PEDICLE SCREW 2 LEVEL N/A 01/22/2017   Procedure: Lumbar Percutaneous Pedicle Screw Placement Lumbar three-four, Lumbar four-five;  Surgeon: Maeola Harman, MD;  Location[facility] OR;  Service: Neurosurgery;  Laterality: N/A;   ROBOTIC ASSISTED LAPAROSCOPIC SACROCOLPOPEXY N/A 03/07/2014   Procedure: ROBOTIC ASSISTED LAPAROSCOPIC SACROCOLPOPEXY With Valli Glance;  Surgeon: Genia Del, MD;  Location: [facility] ORS;  Service: Gynecology;  Laterality: N/A;   ROBOTIC ASSISTED SUPRACERVICAL HYSTERECTOMY WITH BILATERAL SALPINGO OOPHERECTOMY N/A 03/07/2014   Procedure: ROBOTIC ASSISTED SUPRACERVICAL HYSTERECTOMY WITH BILATERAL SALPINGO OOPHORECTOMY;  Surgeon: Genia Del, MD;  Location: [facility] ORS;  Service: Gynecology;  Laterality: N/A;  4 hrs.   TONSILLECTOMY     TOTAL KNEE  ARTHROPLASTY Left 12/30/2012   Procedure: TOTAL KNEE ARTHROPLASTY;  Surgeon: Kathryne Hitch, MD;  Location: [facility] OR[facility];  Service: Orthopedics;  Laterality: Left;  Left Total Knee Arthroplasty   TUBAL LIGATION     WISDOM TOOTH EXTRACTION     Family History  Problem Relation Age of Onset   Breast cancer Mother    Lung cancer Mother    Stroke Father    Heart attack Father    Diabetes Sister    Emphysema Maternal Aunt    Brain cancer Grandchild    Other Grandchild        PSC-had liver transplant   Ulcerative colitis Daughter    Colon cancer Neg Hx    Social History   Tobacco Use   Smoking status: Never   Smokeless tobacco: Never  Vaping Use   Vaping Use: Never used  Substance Use Topics   Alcohol use: No   Drug use: No   Current Outpatient Medications  Medication Sig Dispense Refill   albuterol (VENTOLIN HFA) 108 (90 Base) MCG/ACT inhaler Inhale 2 puffs into the lungs every 6 (six) hours as needed for wheezing or shortness of  breath. 8 g 0   amLODipine (NORVASC) 10 MG tablet Take 1 tablet (10 mg total) by mouth daily. 90 tablet 0   aspirin EC 81 MG tablet Take 1 tablet (81 mg total) by mouth daily. Swallow whole. 90 tablet 3   atorvastatin (LIPITOR) 20 MG tablet Take 1 tablet (20 mg total) by mouth daily. 30 tablet 3   gabapentin (NEURONTIN) 100 MG capsule Take 100 mg by mouth at bedtime.     thyroid (NP THYROID) 30 MG tablet TAKE 1 TABLET BY MOUTH EVERY DAY 90 tablet 1   No current facility-administered medications for this visit.   Allergies  Allergen Reactions   Penicillins Anaphylaxis    REACTION: causes tongue to swell Has patient had a PCN reaction causing immediate rash, facial/tongue/throat swelling, SOB or lightheadedness with hypotension: Yes swelling Has patient had a PCN reaction causing severe rash involving mucus membranes or skin necrosis:  Has patient had a PCN reaction that required hospitalization no Has patient had a PCN reaction occurring within the last 10 years: no If all of the above answers are "NO", then may proceed with Cephalosporin use.    Adhesive [Tape] Other (See Comments)    Please use paper tape   Codeine Nausea Only   Lisinopril     REACTION: wheezing   Other Other (See Comments)    Pt was having knee surgery and was given an epidural, which did not work.   Percocet [Oxycodone-Acetaminophen] Nausea And Vomiting     Review of Systems: All systems reviewed and negative except where noted in HPI.   Lab Results  Component Value Date   WBC 8.2 12/07/2022   HGB 13.0 12/07/2022   HCT 38.1 12/07/2022   MCV 90.3 12/07/2022   PLT 244.0 12/07/2022    Lab Results  Component Value Date   CREATININE 0.69 12/07/2022   BUN 18 12/07/2022   NA 141 12/07/2022   K 4.3 12/07/2022   CL 103 12/07/2022   CO2 29 12/07/2022   Lab Results  Component Value Date   ALT 13 02/09/2023   AST 18 02/09/2023   ALKPHOS 86 02/09/2023   BILITOT 0.5 02/09/2023       Physical  Exam: BP 120/70 (BP Location: Left Arm, Patient Position: Sitting, Cuff Size: Normal)   Pulse 80   Ht 4' 10.5" (  1.486 m) Comment: height measured without shoes  Wt 136 lb 2 oz (61.7 kg)   BMI 27.97 kg/m  Constitutional: Pleasant,well-developed, female in no acute distress. HEENT: Normocephalic and atraumatic. Conjunctivae are normal. No scleral icterus. Neck supple.  Cardiovascular: Normal rate, regular rhythm.  Pulmonary/chest: Effort normal and breath sounds normal. No wheezing, rales or rhonchi. Abdominal: Soft, nondistended, nontender.  There are no masses palpable. No hepatomegaly. Torso / back - R mid low back - focal TTP which reproduces symptoms of pain / makes worse Extremities: no edema Lymphadenopathy: No cervical adenopathy noted. Neurological: Alert and oriented to person place and time. Skin: Skin is warm and dry. No rashes noted. Psychiatric: Normal mood and affect. Behavior is normal.   ASSESSMENT: 82 y.o. female here for assessment of the following  1. Chronic right-sided low back pain without sciatica   2. Gallstones    Patient referred for gallstones in relation to her symptoms of pain.  History as outlined above.  In short, chronic right mid back pain without radiation.  She has no typical biliary colic or abdominal pain, or symptoms in relation to eating.  Her symptoms can be reproduced/worsened with palpation on exam and are present all the time, worsened with activity.  Her exam is most consistent with musculoskeletal pain.  I think it is very unlikely she is having biliary colic to warrant a cholecystectomy or surgical evaluation for this.  We discussed this for a bit.  I counseled her on gallstones in general.  She understands there is risks for development of biliary colic, cholecystitis, or pancreatitis from this in the future.  That being said, many patients have no symptoms at all from gallstones and her symptoms currently would be very unusual again to be  related to biliary colic to warrant a cholecystectomy.  I will refer her to sports medicine to get their opinion on this, perhaps she wants an injection therapy for her back.  If they evaluate her and feel this is not musculoskeletal or she develops more typical biliary colic, asked her to contact me and we can always consider having her see a surgeon for her gallbladder, again I think the likelihood of cholecystectomy providing relief for her pain as she describes it today, would be low.  Her LFTs are normal and reassuring.  All questions answered she agreed with the plan.   PLAN: - refer to sports medicine for low back pain - counseled on gallstones - as above, her current symptoms would seem very unusual for gallstones to cause pain like this, she does not have typical biliary colic. Counseled on risks for biliary colic and pancreatitis, etc. She will contact me if she develops this - otherwise following up with pulmonary for PFTs and further evaluation of dyspnea  Harlin Rain, MD [facility]  CC: Kristian Covey, MD

## 2023-04-08 NOTE — Patient Instructions (Signed)
We are referring you to [facility].  They will contact you directly to schedule an appointment.  It may take a week or more before you hear from them.  Please feel free to contact us if you have not heard from them within 2 weeks and we will follow up on the referral.   Thank you for entrusting me with your care and for choosing [facility], Dr. Ileene Patrick  If your blood pressure at your visit was 140/90 or greater, please contact your primary care physician to follow up on this. ______________________________________________________  If you are age 58 or older, your body mass index should be between 23-30. Your Body mass index is 27.97 kg/m. If this is out of the aforementioned range listed, please consider follow up with your Primary Care Provider.  If you are age 38 or younger, your body mass index should be between 19-25. Your Body mass index is 27.97 kg/m. If this is out of the aformentioned range listed, please consider follow up with your Primary Care Provider.  ________________________________________________________  The [facility] providers would like to encourage you to use [facility] to communicate with providers for non-urgent requests or questions.  Due to long hold times on the telephone, sending your provider a message by [facility] may be a faster and more efficient way to get a response.  Please allow 48 business hours for a response.  Please remember that this is for non-urgent requests.  _______________________________________________________  Due to recent changes in healthcare laws, you may see the results of your imaging and laboratory studies on MyChart before your provider has had a chance to review them.  We understand that in some cases there may be results that are confusing or concerning to you. Not all laboratory results come back in the same time frame and the provider may be waiting for multiple results in order to interpret others.  Please give  [place] 48 hours in order for your provider to thoroughly review all the results before contacting the office for clarification of your results.

## 2023-04-12 NOTE — Progress Notes (Unsigned)
    Aleen Sells D.[facility] [address] [place] [address] Phone: [phone]   Assessment and Plan:     There are no diagnoses linked to this encounter.  ***   Pertinent previous records reviewed include ***   Follow Up: ***     Subjective:   I, Evelyne Makepeace, am serving as a Neurosurgeon for Doctor Richardean Sale  Chief Complaint: low back pain   HPI:   04/13/2023 Patient is a 82 year old female complaining of low back pain. Patient states  Relevant Historical Information: ***  Additional pertinent review of systems negative.   Current Outpatient Medications:    albuterol (VENTOLIN HFA) 108 (90 Base) MCG/ACT inhaler, Inhale 2 puffs into the lungs every 6 (six) hours as needed for wheezing or shortness of breath., Disp: 8 g, Rfl: 0   amLODipine (NORVASC) 10 MG tablet, Take 1 tablet (10 mg total) by mouth daily., Disp: 90 tablet, Rfl: 0   aspirin EC 81 MG tablet, Take 1 tablet (81 mg total) by mouth daily. Swallow whole., Disp: 90 tablet, Rfl: 3   atorvastatin (LIPITOR) 20 MG tablet, Take 1 tablet (20 mg total) by mouth daily., Disp: 30 tablet, Rfl: 3   gabapentin (NEURONTIN) 100 MG capsule, Take 100 mg by mouth at bedtime., Disp: , Rfl:    thyroid (NP THYROID) 30 MG tablet, TAKE 1 TABLET BY MOUTH EVERY DAY, Disp: 90 tablet, Rfl: 1   Objective:     There were no vitals filed for this visit.    There is no height or weight on file to calculate BMI.    Physical Exam:    ***   Electronically signed by:  Aleen Sells [facility] 11:39 AM 04/12/23

## 2023-04-13 ENCOUNTER — Ambulatory Visit: Payer: PPO | Admitting: Sports Medicine

## 2023-04-13 VITALS — BP 122/82 | HR 69 | Ht <= 58 in | Wt 136.0 lb

## 2023-04-13 DIAGNOSIS — M47816 Spondylosis without myelopathy or radiculopathy, lumbar region: Secondary | ICD-10-CM

## 2023-04-13 DIAGNOSIS — M419 Scoliosis, unspecified: Secondary | ICD-10-CM | POA: Diagnosis not present

## 2023-04-13 DIAGNOSIS — Z981 Arthrodesis status: Secondary | ICD-10-CM | POA: Diagnosis not present

## 2023-04-13 DIAGNOSIS — G8929 Other chronic pain: Secondary | ICD-10-CM

## 2023-04-13 DIAGNOSIS — M545 Low back pain, unspecified: Secondary | ICD-10-CM | POA: Diagnosis not present

## 2023-04-13 MED ORDER — MELOXICAM 15 MG PO TABS: 15.0000 mg | ORAL_TABLET | Freq: Every day | ORAL | 0 refills | Status: DC

## 2023-04-13 NOTE — Patient Instructions (Addendum)
Good to see you  - Start meloxicam 15 mg daily x2 weeks.  If still having pain after 2 weeks, complete 3rd-week of meloxicam. May use remaining meloxicam as needed once daily for pain control.  Do not to use additional NSAIDs while taking meloxicam.  May use Tylenol 500-1000 mg 2 to 3 times a day for breakthrough pain. Low back HEP Pt referral  4 week follow up  

## 2023-04-14 ENCOUNTER — Ambulatory Visit: Payer: PPO | Admitting: Sports Medicine

## 2023-04-22 ENCOUNTER — Telehealth: Payer: Self-pay | Admitting: Sports Medicine

## 2023-04-22 NOTE — Telephone Encounter (Signed)
Patient called stating that she was given Meloxicam to take for her back pain but there has not been any improvement. Please advise on next steps.

## 2023-04-22 NOTE — Telephone Encounter (Signed)
Called and left VM to call back to give Dr. Edison Pace recommendation

## 2023-04-22 NOTE — Telephone Encounter (Signed)
Patient called back. Gave Dr Edison Pace response.

## 2023-04-29 ENCOUNTER — Telehealth: Payer: Self-pay | Admitting: Interventional Cardiology

## 2023-04-29 ENCOUNTER — Telehealth: Payer: Self-pay | Admitting: Gastroenterology

## 2023-04-29 ENCOUNTER — Encounter: Payer: Self-pay | Admitting: Family Medicine

## 2023-04-29 DIAGNOSIS — Z79899 Other long term (current) drug therapy: Secondary | ICD-10-CM

## 2023-04-29 DIAGNOSIS — I10 Essential (primary) hypertension: Secondary | ICD-10-CM

## 2023-04-29 MED ORDER — LOSARTAN POTASSIUM 50 MG PO TABS: 50.0000 mg | ORAL_TABLET | Freq: Every day | ORAL | 3 refills | Status: DC

## 2023-04-29 NOTE — Telephone Encounter (Signed)
Patient states she has has rectal bleeding yesterday and today, and she would like a call back to discuss.

## 2023-04-29 NOTE — Telephone Encounter (Signed)
Inbound call from patient stating she has bleeding from rectum starting last night and this morning. Requesting a call back to discuss further. Please advise, thank you.

## 2023-04-29 NOTE — Addendum Note (Signed)
Addended by: Alois Cliche on: 04/29/2023 04:21 PM   Modules accepted: Orders

## 2023-04-29 NOTE — Telephone Encounter (Signed)
Returned call to patient. Pt reports that she had diarrhea once yesterday, that's when she noticed the BRB. I told pt that if she continued to have diarrhea she can use Imodium OTC PRN. Pt reported BRB on the stool and with wiping yesterday and this morning. I informed pt that the bleeding is likely from internal hemorrhoids, pt does not think she has hemorrhoids. I reviewed last office note and she mentioned similar symptoms and MD mentioned it likely being internal hemorrhoids. I told pt that she can't see internal hemorrhoids, but BRBPR is a typical sign. Denies any rectal pain. Pt reports being SOB for years, not worsening. Pt denies any dizziness or fatigue. Denies any black tarry stools. Patient has been advised to monitor symptoms. She can use Preparation H suppositories OTC BID for 3-5 days. Pt has been advised to call and let us know if the bleeding does not improve or if she begins to pass blood independently of stool. Pt verbalized understanding and had no concerns at the end of the call.

## 2023-04-29 NOTE — Addendum Note (Signed)
Addended by: Alois Cliche on: 04/29/2023 04:17 PM   Modules accepted: Orders

## 2023-04-29 NOTE — Telephone Encounter (Signed)
I spoke with patient.  She noticed bright red blood in the toilet bowl and when she wiped last night.  Happened again today when she went to the bathroom.  These are the only 2 episodes she has had.  Reports she does not have history of  hemorrhoids.  Stool is normal color. No dark stools.  Patient is waiting for call back from GI.  I advised her to call GI again and that she could also contact PCP.  If any worsening symptoms patient advised to go to ED or Urgent Care

## 2023-05-05 ENCOUNTER — Other Ambulatory Visit: Payer: Self-pay | Admitting: Nurse Practitioner

## 2023-05-12 ENCOUNTER — Ambulatory Visit: Payer: PPO | Admitting: Sports Medicine

## 2023-05-12 NOTE — Progress Notes (Unsigned)
    Aleen Sells D.[facility] [address] [place] [address] Phone: [phone]   Assessment and Plan:     There are no diagnoses linked to this encounter.  ***   Pertinent previous records reviewed include ***   Follow Up: ***     Subjective:   I, Julie [name], am serving as a Neurosurgeon for Doctor Richardean Sale   Chief Complaint: low back pain    HPI:    04/13/2023 Patient is a 82 year old female complaining of low back pain. Patient states that she has had back pain since November, no MOI. Pain on the right side at her waist , no radiating pain. Tylenol for the pain and that doesn't help much. States she has scoliosis , hx of back surgeries x2 .  05/13/2023 Patient states    Relevant Historical Information: Hypertension, hypothyroidism,  Additional pertinent review of systems negative.   Current Outpatient Medications:    albuterol (VENTOLIN HFA) 108 (90 Base) MCG/ACT inhaler, INHALE 2 PUFFS INTO THE LUNGS EVERY 6 HOURS AS NEEDED FOR WHEEZING OR SHORTNESS OF BREATH, Disp: 6.7 g, Rfl: 0   aspirin EC 81 MG tablet, Take 1 tablet (81 mg total) by mouth daily. Swallow whole., Disp: 90 tablet, Rfl: 3   atorvastatin (LIPITOR) 20 MG tablet, Take 1 tablet (20 mg total) by mouth daily., Disp: 30 tablet, Rfl: 3   gabapentin (NEURONTIN) 100 MG capsule, Take 100 mg by mouth at bedtime., Disp: , Rfl:    losartan (COZAAR) 50 MG tablet, Take 1 tablet (50 mg total) by mouth daily., Disp: 90 tablet, Rfl: 3   meloxicam (MOBIC) 15 MG tablet, Take 1 tablet (15 mg total) by mouth daily., Disp: 30 tablet, Rfl: 0   thyroid (NP THYROID) 30 MG tablet, TAKE 1 TABLET BY MOUTH EVERY DAY, Disp: 90 tablet, Rfl: 1   Objective:     There were no vitals filed for this visit.    There is no height or weight on file to calculate BMI.    Physical Exam:    ***   Electronically signed by:  Aleen Sells [facility] 7:17 AM  05/12/23

## 2023-05-13 ENCOUNTER — Ambulatory Visit: Payer: PPO | Admitting: Sports Medicine

## 2023-05-13 VITALS — BP 110/72 | HR 65 | Ht <= 58 in | Wt 135.0 lb

## 2023-05-13 DIAGNOSIS — Z981 Arthrodesis status: Secondary | ICD-10-CM

## 2023-05-13 DIAGNOSIS — G8929 Other chronic pain: Secondary | ICD-10-CM

## 2023-05-13 DIAGNOSIS — M47816 Spondylosis without myelopathy or radiculopathy, lumbar region: Secondary | ICD-10-CM | POA: Diagnosis not present

## 2023-05-13 DIAGNOSIS — M545 Low back pain, unspecified: Secondary | ICD-10-CM | POA: Diagnosis not present

## 2023-05-13 DIAGNOSIS — M419 Scoliosis, unspecified: Secondary | ICD-10-CM | POA: Diagnosis not present

## 2023-05-13 NOTE — Patient Instructions (Signed)
Discontinue meloxicam  Use tyelnol for day to day pain relief Continue HEP and PT Mri lumbar referral  Follow up 4 days after MRI to discuss results

## 2023-05-14 ENCOUNTER — Ambulatory Visit: Payer: PPO | Attending: Nurse Practitioner

## 2023-05-14 DIAGNOSIS — Z79899 Other long term (current) drug therapy: Secondary | ICD-10-CM

## 2023-05-14 DIAGNOSIS — I10 Essential (primary) hypertension: Secondary | ICD-10-CM

## 2023-05-15 LAB — BASIC METABOLIC PANEL
BUN/Creatinine Ratio: 25 (ref 12–28)
BUN: 15 mg/dL (ref 8–27)
CO2: 22 mmol/L (ref 20–29)
Calcium: 9.4 mg/dL (ref 8.7–10.3)
Chloride: 102 mmol/L (ref 96–106)
Creatinine, Ser: 0.59 mg/dL (ref 0.57–1.00)
Glucose: 101 mg/dL — ABNORMAL HIGH (ref 70–99)
Potassium: 4 mmol/L (ref 3.5–5.2)
Sodium: 140 mmol/L (ref 134–144)
eGFR: 89 mL/min/{1.73_m2} (ref >59)

## 2023-05-18 DIAGNOSIS — G8929 Other chronic pain: Secondary | ICD-10-CM | POA: Diagnosis not present

## 2023-05-18 DIAGNOSIS — M545 Low back pain, unspecified: Secondary | ICD-10-CM | POA: Diagnosis not present

## 2023-05-18 DIAGNOSIS — M5416 Radiculopathy, lumbar region: Secondary | ICD-10-CM | POA: Diagnosis not present

## 2023-05-18 DIAGNOSIS — M47816 Spondylosis without myelopathy or radiculopathy, lumbar region: Secondary | ICD-10-CM | POA: Diagnosis not present

## 2023-05-20 ENCOUNTER — Ambulatory Visit
Admission: RE | Admit: 2023-05-20 | Discharge: 2023-05-20 | Disposition: A | Discharge status: Home / Self Care | Payer: PPO | Source: Ambulatory Visit | Attending: Sports Medicine | Admitting: Sports Medicine

## 2023-05-20 DIAGNOSIS — M47816 Spondylosis without myelopathy or radiculopathy, lumbar region: Secondary | ICD-10-CM | POA: Diagnosis not present

## 2023-05-20 DIAGNOSIS — Z981 Arthrodesis status: Secondary | ICD-10-CM

## 2023-05-20 DIAGNOSIS — M5126 Other intervertebral disc displacement, lumbar region: Secondary | ICD-10-CM | POA: Diagnosis not present

## 2023-05-20 DIAGNOSIS — M419 Scoliosis, unspecified: Secondary | ICD-10-CM

## 2023-05-20 DIAGNOSIS — M48061 Spinal stenosis, lumbar region without neurogenic claudication: Secondary | ICD-10-CM | POA: Diagnosis not present

## 2023-05-20 DIAGNOSIS — G8929 Other chronic pain: Secondary | ICD-10-CM

## 2023-05-21 NOTE — Progress Notes (Unsigned)
    Julie [name] D.[facility] [address] [place] [address] Phone: [phone]   Assessment and Plan:     There are no diagnoses linked to this encounter.  ***   Pertinent previous records reviewed include ***   Follow Up: ***     Subjective:   I, Dequarius Jeffries, am serving as a Neurosurgeon for Doctor Richardean Sale   Chief Complaint: low back pain    HPI:    04/13/2023 Patient is a 82 year old female complaining of low back pain. Patient states that she has had back pain since November, no MOI. Pain on the right side at her waist , no radiating pain. Tylenol for the pain and that doesn't help much. States she has scoliosis , hx of back surgeries x2 .   05/13/2023 Patient states she stopped taking meloxicam thinks it caused her rectal bleeding. Pain is the same    05/24/2023 Patient states    Relevant Historical Information: Hypertension, hypothyroidism,  Additional pertinent review of systems negative.   Current Outpatient Medications:    albuterol (VENTOLIN HFA) 108 (90 Base) MCG/ACT inhaler, INHALE 2 PUFFS INTO THE LUNGS EVERY 6 HOURS AS NEEDED FOR WHEEZING OR SHORTNESS OF BREATH, Disp: 6.7 g, Rfl: 0   aspirin EC 81 MG tablet, Take 1 tablet (81 mg total) by mouth daily. Swallow whole., Disp: 90 tablet, Rfl: 3   atorvastatin (LIPITOR) 20 MG tablet, Take 1 tablet (20 mg total) by mouth daily., Disp: 30 tablet, Rfl: 3   gabapentin (NEURONTIN) 100 MG capsule, Take 100 mg by mouth at bedtime., Disp: , Rfl:    losartan (COZAAR) 50 MG tablet, Take 1 tablet (50 mg total) by mouth daily., Disp: 90 tablet, Rfl: 3   meloxicam (MOBIC) 15 MG tablet, Take 1 tablet (15 mg total) by mouth daily., Disp: 30 tablet, Rfl: 0   thyroid (NP THYROID) 30 MG tablet, TAKE 1 TABLET BY MOUTH EVERY DAY, Disp: 90 tablet, Rfl: 1   Objective:     There were no vitals filed for this visit.    There is no height or weight on file to calculate BMI.     Physical Exam:    ***   Electronically signed by:  Julie [name] [facility] 7:25 AM 05/21/23

## 2023-05-24 ENCOUNTER — Ambulatory Visit: Payer: PPO | Admitting: Sports Medicine

## 2023-05-24 VITALS — HR 81 | Ht <= 58 in | Wt 136.0 lb

## 2023-05-24 DIAGNOSIS — M47816 Spondylosis without myelopathy or radiculopathy, lumbar region: Secondary | ICD-10-CM

## 2023-05-24 DIAGNOSIS — G8929 Other chronic pain: Secondary | ICD-10-CM | POA: Diagnosis not present

## 2023-05-24 DIAGNOSIS — Z981 Arthrodesis status: Secondary | ICD-10-CM | POA: Diagnosis not present

## 2023-05-24 DIAGNOSIS — M419 Scoliosis, unspecified: Secondary | ICD-10-CM

## 2023-05-24 DIAGNOSIS — M545 Low back pain, unspecified: Secondary | ICD-10-CM | POA: Diagnosis not present

## 2023-05-24 NOTE — Patient Instructions (Signed)
Epidural referral right t11-12 Follow up 2 weeks after injection  May continue PT

## 2023-05-25 ENCOUNTER — Other Ambulatory Visit: Payer: PPO

## 2023-05-26 DIAGNOSIS — M5416 Radiculopathy, lumbar region: Secondary | ICD-10-CM | POA: Diagnosis not present

## 2023-05-26 DIAGNOSIS — M47816 Spondylosis without myelopathy or radiculopathy, lumbar region: Secondary | ICD-10-CM | POA: Diagnosis not present

## 2023-05-26 DIAGNOSIS — M545 Low back pain, unspecified: Secondary | ICD-10-CM | POA: Diagnosis not present

## 2023-05-26 DIAGNOSIS — G8929 Other chronic pain: Secondary | ICD-10-CM | POA: Diagnosis not present

## 2023-05-31 ENCOUNTER — Ambulatory Visit
Admission: RE | Admit: 2023-05-31 | Discharge: 2023-05-31 | Disposition: A | Discharge status: Home / Self Care | Payer: PPO | Source: Ambulatory Visit | Attending: Sports Medicine | Admitting: Sports Medicine

## 2023-05-31 DIAGNOSIS — M47816 Spondylosis without myelopathy or radiculopathy, lumbar region: Secondary | ICD-10-CM

## 2023-05-31 DIAGNOSIS — M419 Scoliosis, unspecified: Secondary | ICD-10-CM

## 2023-05-31 DIAGNOSIS — Z981 Arthrodesis status: Secondary | ICD-10-CM

## 2023-05-31 DIAGNOSIS — M549 Dorsalgia, unspecified: Secondary | ICD-10-CM | POA: Diagnosis not present

## 2023-05-31 DIAGNOSIS — G8929 Other chronic pain: Secondary | ICD-10-CM

## 2023-05-31 MED ORDER — IOPAMIDOL (ISOVUE-M 300) INJECTION 61%
1.0000 mL | Freq: Once | INTRAMUSCULAR | Status: AC | PRN
  Administered 2023-05-31: 1 mL via EPIDURAL

## 2023-05-31 MED ORDER — TRIAMCINOLONE ACETONIDE 40 MG/ML IJ SUSP (RADIOLOGY)
60.0000 mg | Freq: Once | INTRAMUSCULAR | Status: AC
  Administered 2023-05-31: 60 mg via EPIDURAL

## 2023-05-31 NOTE — Discharge Instructions (Signed)

## 2023-06-02 DIAGNOSIS — M47816 Spondylosis without myelopathy or radiculopathy, lumbar region: Secondary | ICD-10-CM | POA: Diagnosis not present

## 2023-06-02 DIAGNOSIS — G8929 Other chronic pain: Secondary | ICD-10-CM | POA: Diagnosis not present

## 2023-06-02 DIAGNOSIS — M545 Low back pain, unspecified: Secondary | ICD-10-CM | POA: Diagnosis not present

## 2023-06-02 DIAGNOSIS — H524 Presbyopia: Secondary | ICD-10-CM | POA: Diagnosis not present

## 2023-06-02 DIAGNOSIS — H35372 Puckering of macula, left eye: Secondary | ICD-10-CM | POA: Diagnosis not present

## 2023-06-02 DIAGNOSIS — M5416 Radiculopathy, lumbar region: Secondary | ICD-10-CM | POA: Diagnosis not present

## 2023-06-02 DIAGNOSIS — H353112 Nonexudative age-related macular degeneration, right eye, intermediate dry stage: Secondary | ICD-10-CM | POA: Diagnosis not present

## 2023-06-02 DIAGNOSIS — H43813 Vitreous degeneration, bilateral: Secondary | ICD-10-CM | POA: Diagnosis not present

## 2023-06-02 DIAGNOSIS — E119 Type 2 diabetes mellitus without complications: Secondary | ICD-10-CM | POA: Diagnosis not present

## 2023-06-02 DIAGNOSIS — H04123 Dry eye syndrome of bilateral lacrimal glands: Secondary | ICD-10-CM | POA: Diagnosis not present

## 2023-06-07 ENCOUNTER — Institutional Professional Consult (permissible substitution): Payer: PPO | Admitting: Pulmonary Disease

## 2023-06-09 ENCOUNTER — Other Ambulatory Visit: Payer: Self-pay | Admitting: Nurse Practitioner

## 2023-06-10 ENCOUNTER — Other Ambulatory Visit: Payer: Self-pay | Admitting: Nurse Practitioner

## 2023-06-11 NOTE — Telephone Encounter (Signed)
Pt's pharmacy is requesting a refill on albuterol inhaler. This medication was refilled under Robin Searing, NP last. Would Robin Searing, NP like to refill this non cardiac medication? Please address

## 2023-06-11 NOTE — Progress Notes (Unsigned)
    Julie [name] D.[facility] [address] [place] [address] Phone: [phone]   Assessment and Plan:     There are no diagnoses linked to this encounter.  ***   Pertinent previous records reviewed include ***   Follow Up: ***     Subjective:   I, Julie [name], am serving as a Neurosurgeon for Doctor Richardean Sale   Chief Complaint: low back pain    HPI:    04/13/2023 Patient is a 82 year old female complaining of low back pain. Patient states that she has had back pain since November, no MOI. Pain on the right side at her waist , no radiating pain. Tylenol for the pain and that doesn't help much. States she has scoliosis , hx of back surgeries x2 .   05/13/2023 Patient states she stopped taking meloxicam thinks it caused her rectal bleeding. Pain is the same    05/24/2023  Patient states that she still has back pain    06/14/2023 Patient states    Relevant Historical Information: Hypertension, hypothyroidism,  Additional pertinent review of systems negative.   Current Outpatient Medications:    albuterol (VENTOLIN HFA) 108 (90 Base) MCG/ACT inhaler, INHALE 2 PUFFS INTO THE LUNGS EVERY 6 HOURS AS NEEDED FOR WHEEZING OR SHORTNESS OF BREATH, Disp: 6.7 g, Rfl: 0   aspirin EC 81 MG tablet, Take 1 tablet (81 mg total) by mouth daily. Swallow whole., Disp: 90 tablet, Rfl: 3   atorvastatin (LIPITOR) 20 MG tablet, Take 1 tablet (20 mg total) by mouth daily., Disp: 90 tablet, Rfl: 3   gabapentin (NEURONTIN) 100 MG capsule, Take 100 mg by mouth at bedtime., Disp: , Rfl:    losartan (COZAAR) 50 MG tablet, Take 1 tablet (50 mg total) by mouth daily., Disp: 90 tablet, Rfl: 3   meloxicam (MOBIC) 15 MG tablet, Take 1 tablet (15 mg total) by mouth daily., Disp: 30 tablet, Rfl: 0   thyroid (NP THYROID) 30 MG tablet, TAKE 1 TABLET BY MOUTH EVERY DAY, Disp: 90 tablet, Rfl: 1   Objective:     There were no vitals filed for this visit.    There  is no height or weight on file to calculate BMI.    Physical Exam:    ***   Electronically signed by:  Julie [name] D.[facility] 7:11 AM 06/11/23

## 2023-06-14 ENCOUNTER — Ambulatory Visit: Payer: PPO | Admitting: Sports Medicine

## 2023-06-14 VITALS — BP 118/78 | HR 67 | Ht <= 58 in | Wt 135.0 lb

## 2023-06-14 DIAGNOSIS — G8929 Other chronic pain: Secondary | ICD-10-CM | POA: Diagnosis not present

## 2023-06-14 DIAGNOSIS — M47816 Spondylosis without myelopathy or radiculopathy, lumbar region: Secondary | ICD-10-CM

## 2023-06-14 DIAGNOSIS — M545 Low back pain, unspecified: Secondary | ICD-10-CM

## 2023-06-14 DIAGNOSIS — M419 Scoliosis, unspecified: Secondary | ICD-10-CM

## 2023-06-14 DIAGNOSIS — Z981 Arthrodesis status: Secondary | ICD-10-CM | POA: Diagnosis not present

## 2023-06-22 ENCOUNTER — Telehealth (INDEPENDENT_AMBULATORY_CARE_PROVIDER_SITE_OTHER): Payer: PPO | Admitting: Family Medicine

## 2023-06-22 ENCOUNTER — Encounter: Payer: Self-pay | Admitting: Family Medicine

## 2023-06-22 DIAGNOSIS — U071 COVID-19: Secondary | ICD-10-CM

## 2023-06-22 MED ORDER — NIRMATRELVIR/RITONAVIR (PAXLOVID)TABLET: 3.0000 | ORAL_TABLET | Freq: Two times a day (BID) | ORAL | 0 refills | Status: AC

## 2023-06-22 NOTE — Progress Notes (Signed)
Subjective:    Patient ID: Julie [name], female    DOB: [birth year], 82 y.o.   MRN: [id number]  HPI Virtual Visit via Video Note  I connected with the patient on 06/22/23 at  1:45 PM EDT by a video enabled telemedicine application and verified that I am speaking with the correct person using two identifiers.  Location patient: home Location provider:work or home office Persons participating in the virtual visit: patient, provider  I discussed the limitations of evaluation and management by telemedicine and the availability of in person appointments. The patient expressed understanding and agreed to proceed.   HPI: Here for 2 days of body aches, ST, runny nose, and a dry cough. No fever or SOB. She tested positive this morning for the Covid virus.    ROS: See pertinent positives and negatives per HPI.  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   Other bursitis disorders    history   Personal history of colonic polyps    PONV (postoperative nausea and vomiting)    Pre-diabetes    Pure hypercholesterolemia    Spondylolisthesis, cervical region    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    skin -spots on face, forehead and leg removed   SVD (spontaneous vaginal delivery)    x 1   Unspecified disorder of thyroid    Unspecified hypothyroidism     Past Surgical History:  Procedure Laterality Date   ABDOMINAL HYSTERECTOMY     ANTERIOR CERVICAL DECOMP/DISCECTOMY FUSION N/A 11/11/2017   Procedure:  Cervical seven - Thoracic one  Anterior cervical decompression/discectomy/fusion;  Surgeon: Maeola Harman, MD;  Location: [facility] OR;  Service: Neurosurgery;  Laterality: N/A;   ANTERIOR LAT LUMBAR FUSION Left 01/22/2017   Procedure: Left Lumbar three-four, Lumbar four-five  Anterior lateral lumbar interbody fusion;  Surgeon: Maeola Harman, MD;  Location: [facility] OR;  Service: Neurosurgery;  Laterality: Left;   BACK SURGERY  2002   L4/L5   BLADDER SUSPENSION N/A 03/07/2014   Procedure: TRANSVAGINAL TAPE (TVT) PROCEDURE WITH CYSTO;  Surgeon: Genia Del, MD;  Location: [facility] ORS;  Service: Gynecology;  Laterality: N/A;  vaginal    blephorplasty      bilateral    BREAST SURGERY     x 2 breast implants, later removed   COLONOSCOPY     CYSTOCELE REPAIR N/A 03/07/2014   Procedure: ANTERIOR REPAIR (CYSTOCELE);  Surgeon: Genia Del, MD;  Location: [facility] ORS;  Service: Gynecology;  Laterality: N/A;   devaited septum surgery  1975   EYE SURGERY     bilateral cataract surgery   KNEE ARTHROSCOPY  10/21/2012   right - Procedure: ARTHROSCOPY KNEE;  Surgeon: Kathryne Hitch, MD;  Location: [facility] ORS;  Service: Orthopedics;  Laterality: Left;  Left Knee Arthroscopy, Debridement, partial synovectomy   LUMBAR LAMINECTOMY     LUMBAR PERCUTANEOUS PEDICLE SCREW 2 LEVEL N/A 01/22/2017   Procedure: Lumbar Percutaneous  Pedicle Screw Placement Lumbar three-four, Lumbar four-five;  Surgeon: Maeola Harman, MD;  Location: [facility] OR;  Service: Neurosurgery;  Laterality: N/A;   ROBOTIC ASSISTED LAPAROSCOPIC SACROCOLPOPEXY N/A 03/07/2014   Procedure: ROBOTIC ASSISTED LAPAROSCOPIC SACROCOLPOPEXY With Valli Glance;  Surgeon: Genia Del, MD;  Location: [facility] ORS;  Service: Gynecology;  Laterality: N/A;   ROBOTIC ASSISTED SUPRACERVICAL HYSTERECTOMY WITH BILATERAL SALPINGO OOPHERECTOMY N/A 03/07/2014   Procedure: ROBOTIC ASSISTED SUPRACERVICAL HYSTERECTOMY WITH BILATERAL SALPINGO OOPHORECTOMY;  Surgeon: Genia Del, MD;  Location: [facility]  ORS;  Service: Gynecology;  Laterality: N/A;  4 hrs.   TONSILLECTOMY     TOTAL KNEE ARTHROPLASTY Left 12/30/2012   Procedure: TOTAL KNEE ARTHROPLASTY;  Surgeon: Kathryne Hitch, MD;  Location: [facility] ORS;  Service: Orthopedics;  Laterality: Left;  Left Total Knee Arthroplasty   TUBAL LIGATION     WISDOM TOOTH EXTRACTION      Family History  Problem Relation Age of Onset   Breast cancer Mother    Lung cancer Mother    Stroke Father    Heart attack Father    Diabetes Sister    Emphysema Maternal Aunt    Brain cancer Grandchild    Other Grandchild        PSC-had liver transplant   Ulcerative colitis Daughter    Colon cancer Neg Hx      Current Outpatient Medications:    albuterol (VENTOLIN HFA) 108 (90 Base) MCG/ACT inhaler, INHALE 2 PUFFS INTO THE LUNGS EVERY 6 HOURS AS NEEDED FOR WHEEZING OR SHORTNESS OF BREATH, Disp: 6.7 g, Rfl: 0   aspirin EC 81 MG tablet, Take 1 tablet (81 mg total) by mouth daily. Swallow whole., Disp: 90 tablet, Rfl: 3   atorvastatin (LIPITOR) 20 MG tablet, Take 1 tablet (20 mg total) by mouth daily., Disp: 90 tablet, Rfl: 3   gabapentin (NEURONTIN) 100 MG capsule, Take 100 mg by mouth at bedtime., Disp: , Rfl:    losartan (COZAAR) 50 MG tablet, Take 1 tablet (50 mg total) by mouth daily., Disp: 90 tablet, Rfl: 3   nirmatrelvir/ritonavir (PAXLOVID) 20 x 150 MG & 10 x 100MG  TABS, Take 3 tablets by mouth 2 (two) times daily for 5 days. (Take nirmatrelvir 150 mg two tablets twice daily for 5 days and ritonavir 100 mg one tablet twice daily for 5 days) Patient GFR is 89, Disp: 30 tablet, Rfl: 0   thyroid (NP THYROID) 30 MG tablet, TAKE 1 TABLET BY MOUTH EVERY DAY, Disp: 90 tablet, Rfl: 1  EXAM:  VITALS per patient if applicable:  GENERAL: alert, oriented, appears well and in no acute distress  HEENT: atraumatic, conjunttiva clear, no obvious abnormalities on inspection of external nose and ears  NECK: normal movements of the head and neck  LUNGS: on  inspection no signs of respiratory distress, breathing rate appears normal, no obvious gross SOB, gasping or wheezing  CV: no obvious cyanosis  MS: moves all visible extremities without noticeable abnormality  PSYCH/NEURO: pleasant and cooperative, no obvious depression or anxiety, speech and thought processing grossly intact  ASSESSMENT AND PLAN: Covid infection. Treat with 5 days of Paxlovid. Gershon Crane, MD  Discussed the following assessment and plan:  No diagnosis found.     I discussed the assessment and treatment plan with the patient. The patient was provided an opportunity to ask questions and all were answered. The patient agreed with the plan and demonstrated an understanding of the instructions.   The patient was advised to call back or seek an in-person evaluation if  the symptoms worsen or if the condition fails to improve as anticipated.      Review of Systems     Objective:   Physical Exam        Assessment & Plan:

## 2023-06-29 ENCOUNTER — Encounter: Payer: Self-pay | Admitting: Family Medicine

## 2023-06-29 DIAGNOSIS — E039 Hypothyroidism, unspecified: Secondary | ICD-10-CM

## 2023-06-30 ENCOUNTER — Ambulatory Visit: Payer: PPO | Admitting: Gastroenterology

## 2023-06-30 MED ORDER — LEVOTHYROXINE SODIUM 50 MCG PO TABS: 50.0000 ug | ORAL_TABLET | Freq: Every day | ORAL | 0 refills | Status: DC

## 2023-07-03 ENCOUNTER — Other Ambulatory Visit: Payer: Self-pay | Admitting: Nurse Practitioner

## 2023-07-05 NOTE — Progress Notes (Unsigned)
Cardiology Office Note    Patient Name: Julie [name] Date of Encounter: 07/05/2023  Primary Care Provider:  Kristian Covey, MD Primary Cardiologist:  None Primary Electrophysiologist: None   Past Medical History    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 female with a PMH of nonobstructive CAD, hypercholesteremia, prediabetes, essential hypertension, GERD, hypothyroidism who presents today for 36-month follow-up.  Julie [name] was last seen on 04/06/23 for 1 month follow-up of shortness of breath and BP.  She underwent a nuclear stress test that was normal and low risk and also wore an event monitor due to complaint of palpitations that showed brief runs of PACs and PVCs.  During her follow-up she continued to endorse shortness of breath and was referred to pulmonology for PFTs.  She was also prescribed an albuterol inhaler at that time.  She was advised to continue to monitor her blood pressures and losartan was increased to 100 mg daily and  then decrease back to 50 mg.  During today's visit the patient reports they are still experiencing shortness of breath with activity.  She denies any associated symptoms such as palpitations, dizziness, or chest pain.  Her blood pressure has been much improved with increased dose of losartan.  Blood pressure today was 120/68 and BP log was also reviewed with blood pressures ranging from the 120s-130s systolically.  She is compliant with her current medications and denies any adverse reactions.  She was recently diagnosed with COVID and treated with Paxlovid last month.  She has no residual effects from recent COVID diagnosis or related shortness of breath.  Patient denies chest pain, palpitations, dyspnea, PND, orthopnea, nausea, vomiting, dizziness, syncope, edema, weight gain, or early satiety.  Review of Systems  Please see the history of present illness.    All other systems reviewed and are otherwise negative except as noted above.  Physical Exam    Wt Readings from Last 3 Encounters:  06/14/23 135 lb (61.2 kg)  05/24/23 136 lb (61.7 kg)  05/13/23 135 lb (61.2 kg)   [id number] were no vitals filed for this visit.,There is no height or weight on file to calculate BMI. GEN: Well nourished, well developed in no acute distress Neck: No JVD; No carotid bruits Pulmonary: Clear to auscultation without rales, wheezing or rhonchi  Cardiovascular: Normal rate. Regular rhythm.  Normal S1. Normal S2.   Murmurs: There is no murmur.  ABDOMEN: Soft, non-tender, non-distended EXTREMITIES:  No edema; No deformity   EKG/LABS/ Recent Cardiac Studies   ECG personally reviewed by me today -none completed today  Lab Results  Component Value Date   WBC 8.2 12/07/2022   HGB 13.0 12/07/2022   HCT 38.1 12/07/2022   MCV 90.3 12/07/2022   PLT 244.0 12/07/2022   Lab Results  Component Value Date   CREATININE 0.59 05/14/2023   BUN 15 05/14/2023   NA 140 05/14/2023   K 4.0 05/14/2023   CL 102 05/14/2023    CO2 22 05/14/2023   Lab Results  Component Value Date   CHOL 241 (H) 02/09/2023   HDL 51 02/09/2023   LDLCALC 168 (H) 02/09/2023   LDLDIRECT 158.1 10/30/2013   TRIG 120 02/09/2023   CHOLHDL 4.7 (H) 02/09/2023    Lab Results  Component Value Date   HGBA1C 5.6 09/22/2021   Assessment & Plan    1.  Essential hypertension: -Patient's blood pressure today was controlled at 120/68  -Continue losartan 50 mg daily  2.  Nonobstructive CAD: --Cardiac CTA completed 2021 showing calcium score of 307 and (50-69%) stenosis in proximal LAD with noncalcified plaques  -Myoview completed that was low risk and normal on  02/10/23 -Continue Lipitor 20 mg daily -Patient recently discontinued ASA 81 mg due to increased bruising  3.  Hypercholesteremia: -Patient was started on Lipitor 20 mg due to elevated LDL -We will order fasting LFTs and lipids  4.  Hypothyroidism: -Currently managed by PCP -BP currently controlled  5.  Dyspnea on exertion: -2D echo by PCP that was normal with EF of 60-65% with grade 1 DD and no RWMA with trivial MVR  -Patient will complete referral visit this Friday with pulmonology  Disposition: Follow-up with None or APP in 6 months    Signed, Napoleon Form, Leodis Rains, NP 07/05/2023, 1:15 PM [facility]

## 2023-07-06 ENCOUNTER — Ambulatory Visit: Payer: PPO | Attending: Nurse Practitioner | Admitting: Nurse Practitioner

## 2023-07-06 ENCOUNTER — Encounter: Payer: Self-pay | Admitting: Nurse Practitioner

## 2023-07-06 VITALS — BP 120/68 | HR 82 | Ht <= 58 in | Wt 133.4 lb

## 2023-07-06 DIAGNOSIS — I2510 Atherosclerotic heart disease of native coronary artery without angina pectoris: Secondary | ICD-10-CM | POA: Diagnosis not present

## 2023-07-06 DIAGNOSIS — E039 Hypothyroidism, unspecified: Secondary | ICD-10-CM | POA: Diagnosis not present

## 2023-07-06 DIAGNOSIS — I10 Essential (primary) hypertension: Secondary | ICD-10-CM | POA: Diagnosis not present

## 2023-07-06 DIAGNOSIS — R0609 Other forms of dyspnea: Secondary | ICD-10-CM

## 2023-07-06 DIAGNOSIS — E7800 Pure hypercholesterolemia, unspecified: Secondary | ICD-10-CM | POA: Diagnosis not present

## 2023-07-06 NOTE — Patient Instructions (Signed)
Medication Instructions:  Your physician recommends that you continue on your current medications as directed. Please refer to the Current Medication list given to you today. *If you need a refill on your cardiac medications before your next appointment, please call your pharmacy*   Lab Work: FASTING LFT & LIPIDS If you have labs (blood work) drawn today and your tests are completely normal, you will receive your results only by: MyChart Message (if you have MyChart) OR A paper copy in the mail If you have any lab test that is abnormal or we need to change your treatment, we will call you to review the results.   Testing/Procedures: NONE ORDERED   Follow-Up: At [facility], you and your health needs are our priority.  As part of our continuing mission to provide you with exceptional heart care, we have created designated Provider Care Teams.  These Care Teams include your primary Cardiologist (physician) and Advanced Practice Providers (APPs -  Physician Assistants and Nurse Practitioners) who all work together to provide you with the care you need, when you need it.  We recommend signing up for the patient portal called "MyChart".  Sign up information is provided on this After Visit Summary.  MyChart is used to connect with patients for Virtual Visits (Telemedicine).  Patients are able to view lab/test results, encounter notes, upcoming appointments, etc.  Non-urgent messages can be sent to your provider as well.   To learn more about what you can do with MyChart, go to [email or web address].    Your next appointment:   6 month(s)  Provider:   Robin Searing, NP    Other Instructions

## 2023-07-09 ENCOUNTER — Ambulatory Visit (HOSPITAL_BASED_OUTPATIENT_CLINIC_OR_DEPARTMENT_OTHER): Payer: PPO | Admitting: Pulmonary Disease

## 2023-07-09 ENCOUNTER — Telehealth (HOSPITAL_COMMUNITY): Payer: Self-pay

## 2023-07-09 ENCOUNTER — Encounter (HOSPITAL_BASED_OUTPATIENT_CLINIC_OR_DEPARTMENT_OTHER): Payer: Self-pay | Admitting: Pulmonary Disease

## 2023-07-09 ENCOUNTER — Encounter (HOSPITAL_COMMUNITY): Payer: Self-pay

## 2023-07-09 VITALS — BP 112/70 | HR 84 | Resp 16 | Ht <= 58 in | Wt 132.6 lb

## 2023-07-09 DIAGNOSIS — R0602 Shortness of breath: Secondary | ICD-10-CM | POA: Diagnosis not present

## 2023-07-09 DIAGNOSIS — R0902 Hypoxemia: Secondary | ICD-10-CM | POA: Diagnosis not present

## 2023-07-09 NOTE — Progress Notes (Signed)
Subjective:   PATIENT ID: Julie [name] GENDER: female DOB: 1940/12/26, MRN: [id number]  Chief Complaint  Patient presents with   Consult    Reason for Visit: New consult for shortness of breath  [name] is a 82 year old female never smoker with HTN, prediabetes, HLD, hypothyroidism, osteoporosis, lumbar scoliosis, insomnia who presents for evaluation of shortness of breath.  She reports shortness of breath since November 2023, >10 months. Worsens with walking and in the shower or any activity. Rest and sitting down improves it. In October 2023 she was in traveling in [place] for three weeks and felt tired and short of breath with more activity than usual than trip. No acute illness preceding this but has felt her shortness of breath has gradually worsened. Occurs when getting dressed. Before the trip she was not active at baseline due to her back issues/scoliosis but did not feel like her shortness of breath was this bad. Has some nonproductive coughing that requires daily cough drops and worsens with laying down. Denies wheezing. Has gained 20 lbs due to inactivity. Denies history of childhood asthma or chronic bronchitis.  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   Other bursitis disorders    history   Personal history of colonic  polyps    PONV (postoperative nausea and vomiting)    Pre-diabetes    Pure hypercholesterolemia    Spondylolisthesis, cervical region    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    skin -spots on face, forehead and leg removed   SVD (spontaneous vaginal delivery)    x 1   Unspecified disorder of thyroid    Unspecified hypothyroidism      Family History  Problem Relation Age of Onset   Breast cancer Mother    Lung cancer Mother    Stroke Father    Heart attack Father    Diabetes Sister    Emphysema Maternal Aunt    Brain cancer Grandchild    Other Grandchild        PSC-had liver transplant   Ulcerative colitis Daughter    Colon cancer Neg Hx      Social History   Occupational History   Occupation: retired Social worker  Tobacco Use   Smoking status: Never    Passive exposure: Past   Smokeless tobacco: Never  Vaping Use   Vaping status: Never Used  Substance and Sexual Activity   Alcohol use: No   Drug use: No   Sexual activity: Yes    Birth control/protection: Post-menopausal    Allergies  Allergen Reactions   Penicillins Anaphylaxis    REACTION: causes tongue to swell Has patient had a PCN reaction causing immediate rash, facial/tongue/throat swelling, SOB or lightheadedness with hypotension: Yes swelling Has patient had  a PCN reaction causing severe rash involving mucus membranes or skin necrosis:  Has patient had a PCN reaction that required hospitalization no Has patient had a PCN reaction occurring within the last 10 years: no If all of the above answers are "NO", then may proceed with Cephalosporin use.    Adhesive [Tape] Other (See Comments)    Please use paper tape   Codeine Nausea Only   Lisinopril     REACTION: wheezing   Other Other (See Comments)    Pt was having knee surgery and was given an epidural, which did not work.   Percocet [Oxycodone-Acetaminophen] Nausea And Vomiting     Outpatient Medications Prior to Visit  Medication Sig  Dispense Refill   albuterol (VENTOLIN HFA) 108 (90 Base) MCG/ACT inhaler INHALE 2 PUFFS INTO THE LUNGS EVERY 6 HOURS AS NEEDED FOR WHEEZING OR SHORTNESS OF BREATH 6.7 g 0   atorvastatin (LIPITOR) 20 MG tablet Take 1 tablet (20 mg total) by mouth daily. 90 tablet 3   gabapentin (NEURONTIN) 100 MG capsule Take 100 mg by mouth as needed.     levothyroxine (SYNTHROID) 50 MCG tablet Take 1 tablet (50 mcg total) by mouth daily. 90 tablet 0   losartan (COZAAR) 50 MG tablet Take 1 tablet (50 mg total) by mouth daily. 90 tablet 3   VITAMIN E PO Take by mouth.     aspirin EC 81 MG tablet Take 1 tablet (81 mg total) by mouth daily. Swallow whole. (Patient not taking: Reported on 07/06/2023) 90 tablet 3   No facility-administered medications prior to visit.    Review of Systems  Constitutional:  Negative for chills, diaphoresis, fever, malaise/fatigue and weight loss.  HENT:  Positive for congestion.   Respiratory:  Positive for shortness of breath. Negative for cough, hemoptysis, sputum production and wheezing.   Cardiovascular:  Negative for chest pain, palpitations and leg swelling.  Neurological:  Positive for headaches.     Objective:   Vitals:   07/09/23 0829  BP: 112/70  Pulse: 84  Resp: 16  SpO2: 92%  Weight: 132 lb 9.6 oz (60.1 kg)  Height: 4\' 10"  (1.473 m)   SpO2: 92 %  Physical Exam: General: Well-appearing, no acute distress HENT: [place], AT Eyes: EOMI, no scleral icterus Respiratory: Clear to auscultation bilaterally.  No crackles, wheezing or rales Cardiovascular: RRR, -M/R/G, no JVD Extremities:-Edema,-tenderness Neuro: AAO x4, CNII-XII grossly intact Psych: Normal mood, normal affect  Data Reviewed:  Imaging: CT Chest 03/04/23 - Tiny scattered nodules bilaterally measuring 2-3 mm. No masses, infiltrate, effusion or pneumothorax.  PFT: None on file  Labs: CBC    Component Value Date/Time   WBC 8.2 12/07/2022 1427   RBC 4.22 12/07/2022 1427   HGB 13.0 12/07/2022  1427   HGB 12.0 09/02/2020 0856   HCT 38.1 12/07/2022 1427   HCT 35.4 09/02/2020 0856   PLT 244.0 12/07/2022 1427   PLT 261 09/02/2020 0856   MCV 90.3 12/07/2022 1427   MCV 90 09/02/2020 0856   MCH 30.1 10/31/2021 1745   MCHC 34.1 12/07/2022 1427   RDW 14.0 12/07/2022 1427   RDW 12.8 09/02/2020 0856   LYMPHSABS 1.7 12/07/2022 1427   MONOABS 0.5 12/07/2022 1427   EOSABS 0.1 12/07/2022 1427   BASOSABS 0.0 12/07/2022 1427   Cardiac: Echo 12/30/22 EF 60-65%. No WMA. Grade I DD     Assessment & Plan:   Discussion: 82 year old female never smoker with HTN, prediabetes, HLD, hypothyroidism, osteoporosis, lumbar scoliosis, insomnia  who presents for evaluation of shortness of breath.  Shortness of breath --ORDER pulmonary function tests --ORDER overnight oximetry --START Albuterol every 4-6 hours AS NEEDED for shortness of breath or wheezing --Referred to Pulmonary Rehab  Health Maintenance Immunization History  Administered Date(s) Administered   Influenza-Unspecified 09/02/2018   PFIZER(Purple Top)SARS-COV-2 Vaccination 12/07/2019, 01/02/2020   Pneumococcal Conjugate-13 02/24/2016   Pneumococcal Polysaccharide-23 02/11/2015   Tdap 04/23/2017, 05/09/2022   CT Lung Screen - not qualified  Orders Placed This Encounter  Procedures   AMB referral to pulmonary rehabilitation    Referral Priority:   Urgent    Referral Type:   Consultation    Number of Visits Requested:   1   Pulse oximetry, overnight    On room air    Standing Status:   Future    Standing Expiration Date:   07/08/2024   Pulmonary function test    Standing Status:   Future    Standing Expiration Date:   07/08/2024    Order Specific Question:   Where should this test be performed?    Answer:   [facility]    Order Specific Question:   Full PFT: includes the following: basic spirometry, spirometry pre & post bronchodilator, diffusion capacity (DLCO), lung volumes    Answer:   Full PFT  No orders of the  defined types were placed in this encounter.   Return in about 3 months (around 10/08/2023) for OK to schedule PFT when next available or this afternoon if patient is interested.  I have spent a total time of 60-minutes on the day of the appointment reviewing prior documentation, coordinating care and discussing medical diagnosis and plan with the patient/family. Imaging, labs and tests included in this note have been reviewed and interpreted independently by me.  Juandaniel Manfredo Mechele Collin, MD [facility] 07/09/2023 9:25 AM  Office Number [phone]

## 2023-07-09 NOTE — Telephone Encounter (Signed)
Called patient to see if she was interested in participating in the Pulmonary Rehab Program. Patient stated yes. Patient will come in for orientation on 07/16/23 @ 1PM and will attend the 1:15PM exercise class.   Pensions consultant.

## 2023-07-09 NOTE — Telephone Encounter (Signed)
Pt returned phone call and is interested in the pulmonary rehab program, pass over to nurse navigator for review.

## 2023-07-09 NOTE — Patient Instructions (Signed)
Shortness of breath --ORDER pulmonary function tests --ORDER overnight oximetry --START Albuterol every 4-6 hours AS NEEDED for shortness of breath or wheezing

## 2023-07-15 ENCOUNTER — Telehealth (HOSPITAL_COMMUNITY): Payer: Self-pay

## 2023-07-15 NOTE — Telephone Encounter (Signed)
Called to confirm appt. Pt confirmed appt. Instructed pt on proper footwear. Gave directions along with department number.

## 2023-07-16 ENCOUNTER — Encounter (HOSPITAL_COMMUNITY): Payer: Self-pay

## 2023-07-16 ENCOUNTER — Encounter (HOSPITAL_COMMUNITY)
Admission: RE | Admit: 2023-07-16 | Discharge: 2023-07-16 | Disposition: A | Discharge status: Home / Self Care | Payer: PPO | Source: Ambulatory Visit | Attending: Pulmonary Disease | Admitting: Pulmonary Disease

## 2023-07-16 VITALS — BP 130/72 | HR 81 | Ht 59.0 in | Wt 134.9 lb

## 2023-07-16 DIAGNOSIS — I10 Essential (primary) hypertension: Secondary | ICD-10-CM | POA: Insufficient documentation

## 2023-07-16 DIAGNOSIS — R0602 Shortness of breath: Secondary | ICD-10-CM | POA: Insufficient documentation

## 2023-07-16 NOTE — Progress Notes (Signed)
Pulmonary Individual Treatment Plan  Patient Details  Name: Julie [name] MRN: [id number] Date of Birth: 1941/02/25 Referring Provider:   Doristine Devoid Pulmonary Rehab Walk Test from 07/16/2023 in [facility]  Referring Provider Everardo All       Initial Encounter Date:  Flowsheet Row Pulmonary Rehab Walk Test from 07/16/2023 in [facility]  Date 07/16/23       Visit Diagnosis: Shortness of breath  Patient's Home Medications on Admission:   Current Outpatient Medications:    albuterol (VENTOLIN HFA) 108 (90 Base) MCG/ACT inhaler, INHALE 2 PUFFS INTO THE LUNGS EVERY 6 HOURS AS NEEDED FOR WHEEZING OR SHORTNESS OF BREATH, Disp: 6.7 g, Rfl: 0   atorvastatin (LIPITOR) 20 MG tablet, Take 1 tablet (20 mg total) by mouth daily., Disp: 90 tablet, Rfl: 3   gabapentin (NEURONTIN) 100 MG capsule, Take 100 mg by mouth as needed., Disp: , Rfl:    levothyroxine (SYNTHROID) 50 MCG tablet, Take 1 tablet (50 mcg total) by mouth daily., Disp: 90 tablet, Rfl: 0   losartan (COZAAR) 50 MG tablet, Take 1 tablet (50 mg total) by mouth daily., Disp: 90 tablet, Rfl: 3   VITAMIN E PO, Take by mouth., Disp: , Rfl:   Past Medical History: 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   Other bursitis disorders    history   Personal history of colonic polyps    PONV (postoperative nausea and vomiting)    Pre-diabetes     Pure hypercholesterolemia    Spondylolisthesis, cervical region    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    skin -spots on face, forehead and leg removed   SVD (spontaneous vaginal delivery)    x 1   Unspecified disorder of thyroid    Unspecified hypothyroidism     Tobacco Use: Social History   Tobacco Use  Smoking Status Never   Passive exposure: Past  Smokeless Tobacco Never    Labs: Review Flowsheet  More data exists      Latest Ref Rng & Units 01/16/2019 12/15/2019 09/02/2020 09/22/2021 02/09/2023  Labs for ITP Cardiac and Pulmonary Rehab  Cholestrol 100 - 199 mg/dL - - 478  - 295   LDL (calc) 0 - 99 mg/dL - - 621  - 308   HDL-C >39 mg/dL - - 49  - 51   Trlycerides 0 - 149 mg/dL - - 76  - 657   Hemoglobin A1c 4.0 - 5.6 % 5.6  6.0  - 5.6  -    Details            Capillary Blood Glucose: Lab Results  Component Value Date   GLUCAP 99 01/23/2017   GLUCAP 97 01/22/2017   GLUCAP 166 (H) 12/30/2012     Pulmonary Assessment Scores:  Pulmonary Assessment Scores  Row Name 07/16/23 1341         ADL UCSD   ADL Phase Entry     SOB Score total 60       CAT Score   CAT Score 19       mMRC Score   mMRC Score 4             UCSD: Self-administered rating of dyspnea associated with activities of daily living (ADLs) 6-point scale (0 = "not at all" to 5 = "maximal or unable to do because of breathlessness")  Scoring Scores range from 0 to 120.  Minimally important difference is 5 units  CAT: CAT can identify the health impairment of COPD patients and is better correlated with disease progression.  CAT has a scoring range of zero to 40. The CAT score is classified into four groups of low (less than 10), medium (10 - 20), high (21-30) and very high (31-40) based on the impact level of disease on health status. A CAT score over 10 suggests significant symptoms.  A worsening CAT score could be explained by an exacerbation, poor medication adherence, poor  inhaler technique, or progression of COPD or comorbid conditions.  CAT MCID is 2 points  mMRC: mMRC (Modified Medical Research Council) Dyspnea Scale is used to assess the degree of baseline functional disability in patients of respiratory disease due to dyspnea. No minimal important difference is established. A decrease in score of 1 point or greater is considered a positive change.   Pulmonary Function Assessment:  Pulmonary Function Assessment - 07/16/23 1311       Breath   Bilateral Breath Sounds Clear    Shortness of Breath Yes;Limiting activity             Exercise Target Goals: Exercise Program Goal: Individual exercise prescription set using results from initial 6 min walk test and THRR while considering  patient's activity barriers and safety.   Exercise Prescription Goal: Initial exercise prescription builds to 30-45 minutes a day of aerobic activity, 2-3 days per week.  Home exercise guidelines will be given to patient during program as part of exercise prescription that the participant will acknowledge.  Activity Barriers & Risk Stratification:  Activity Barriers & Cardiac Risk Stratification - 07/16/23 1310       Activity Barriers & Cardiac Risk Stratification   Activity Barriers History of Falls;Deconditioning;Muscular Weakness;Shortness of Breath;Balance Concerns;Back Problems             6 Minute Walk:  6 Minute Walk     Row Name 07/16/23 1400         6 Minute Walk   Phase Initial     Distance 823 feet     Walk Time 6 minutes     # of Rest Breaks 0     MPH 1.56     METS 1.2     RPE 11     Perceived Dyspnea  1     VO2 Peak 4.19     Resting HR 81 bpm     Resting BP 130/72     Resting Oxygen Saturation  94 %     Exercise Oxygen Saturation  during 6 min walk 92 %     Max Ex. HR 105 bpm     Max Ex. BP 132/82     2 Minute Post BP 128/80       Interval HR   1 Minute HR 95     2 Minute HR 107  3 Minute HR 103     4 Minute HR 105     5  Minute HR 101     6 Minute HR 100     2 Minute Post HR 86     Interval Heart Rate? Yes       Interval Oxygen   Interval Oxygen? Yes     Baseline Oxygen Saturation % 94 %     1 Minute Oxygen Saturation % 93 %     1 Minute Liters of Oxygen 0 L     2 Minute Oxygen Saturation % 94 %     2 Minute Liters of Oxygen 0 L     3 Minute Oxygen Saturation % 97 %     3 Minute Liters of Oxygen 0 L     4 Minute Oxygen Saturation % 92 %     4 Minute Liters of Oxygen 0 L     5 Minute Oxygen Saturation % 94 %     5 Minute Liters of Oxygen 0 L     6 Minute Oxygen Saturation % 95 %     6 Minute Liters of Oxygen 0 L     2 Minute Post Oxygen Saturation % 97 %     2 Minute Post Liters of Oxygen 0 L              Oxygen Initial Assessment:  Oxygen Initial Assessment - 07/16/23 1310       Home Oxygen   Home Oxygen Device None    Sleep Oxygen Prescription None    Home Exercise Oxygen Prescription None    Home Resting Oxygen Prescription None      Initial 6 min Walk   Oxygen Used None      Program Oxygen Prescription   Program Oxygen Prescription None      Intervention   Short Term Goals To learn and understand importance of maintaining oxygen saturations>88%;To learn and demonstrate proper use of respiratory medications;To learn and understand importance of monitoring SPO2 with pulse oximeter and demonstrate accurate use of the pulse oximeter.;To learn and demonstrate proper pursed lip breathing techniques or other breathing techniques.     Long  Term Goals Maintenance of O2 saturations>88%;Compliance with respiratory medication;Demonstrates proper use of MDI's;Exhibits proper breathing techniques, such as pursed lip breathing or other method taught during program session;Verbalizes importance of monitoring SPO2 with pulse oximeter and return demonstration             Oxygen Re-Evaluation:   Oxygen Discharge (Final Oxygen Re-Evaluation):   Initial Exercise Prescription:  Initial  Exercise Prescription - 07/16/23 1400       Date of Initial Exercise RX and Referring Provider   Date 07/16/23    Referring Provider Everardo All    Expected Discharge Date 10/14/23      Recumbant Bike   Level 1    Minutes 15    METs 1.5      Recumbant Elliptical   Level 1    Minutes 15    METs 2      Prescription Details   Frequency (times per week) 2    Duration Progress to 30 minutes of continuous aerobic without signs/symptoms of physical distress      Intensity   THRR 40-80% of Max Heartrate 55-110    Ratings of Perceived Exertion 11-13    Perceived Dyspnea 0-4      Progression   Progression Continue to progress workloads to maintain intensity without signs/symptoms of physical  distress.      Resistance Training   Training Prescription Yes    Weight red bands    Reps 10-15             Perform Capillary Blood Glucose checks as needed.  Exercise Prescription Changes:   Exercise Comments:   Exercise Goals and Review:   Exercise Goals     Row Name 07/16/23 1310             Exercise Goals   Increase Physical Activity Yes       Intervention Provide advice, education, support and counseling about physical activity/exercise needs.;Develop an individualized exercise prescription for aerobic and resistive training based on initial evaluation findings, risk stratification, comorbidities and participant's personal goals.       Expected Outcomes Short Term: Attend rehab on a regular basis to increase amount of physical activity.;Long Term: Exercising regularly at least 3-5 days a week.;Long Term: Add in home exercise to make exercise part of routine and to increase amount of physical activity.       Increase Strength and Stamina Yes       Intervention Provide advice, education, support and counseling about physical activity/exercise needs.;Develop an individualized exercise prescription for aerobic and resistive training based on initial evaluation findings, risk  stratification, comorbidities and participant's personal goals.       Expected Outcomes Short Term: Increase workloads from initial exercise prescription for resistance, speed, and METs.;Short Term: Perform resistance training exercises routinely during rehab and add in resistance training at home;Long Term: Improve cardiorespiratory fitness, muscular endurance and strength as measured by increased METs and functional capacity ( )       Able to understand and use rate of perceived exertion (RPE) scale Yes       Intervention Provide education and explanation on how to use RPE scale       Expected Outcomes Short Term: Able to use RPE daily in rehab to express subjective intensity level;Long Term:  Able to use RPE to guide intensity level when exercising independently       Able to understand and use Dyspnea scale Yes       Intervention Provide education and explanation on how to use Dyspnea scale       Expected Outcomes Short Term: Able to use Dyspnea scale daily in rehab to express subjective sense of shortness of breath during exertion;Long Term: Able to use Dyspnea scale to guide intensity level when exercising independently       Knowledge and understanding of Target Heart Rate Range (THRR) Yes       Intervention Provide education and explanation of THRR including how the numbers were predicted and where they are located for reference       Expected Outcomes Short Term: Able to state/look up THRR;Short Term: Able to use daily as guideline for intensity in rehab;Long Term: Able to use THRR to govern intensity when exercising independently       Understanding of Exercise Prescription Yes       Intervention Provide education, explanation, and written materials on patient's individual exercise prescription       Expected Outcomes Short Term: Able to explain program exercise prescription;Long Term: Able to explain home exercise prescription to exercise independently                Exercise Goals  Re-Evaluation :   Discharge Exercise Prescription (Final Exercise Prescription Changes):   Nutrition:  Target Goals: Understanding of nutrition guidelines, daily intake of sodium 1500mg , cholesterol <  200mg , calories 30% from fat and 7% or less from saturated fats, daily to have 5 or more servings of fruits and vegetables.  Biometrics:  Pre Biometrics - 07/16/23 1305       Pre Biometrics   Grip Strength 10 kg              Nutrition Therapy Plan and Nutrition Goals:   Nutrition Assessments:  MEDIFICTS Score Key: >=70 Need to make dietary changes  40-70 Heart Healthy Diet <= 40 Therapeutic Level Cholesterol Diet   Picture Your Plate Scores: <02 Unhealthy dietary pattern with much room for improvement. 41-50 Dietary pattern unlikely to meet recommendations for good health and room for improvement. 51-60 More healthful dietary pattern, with some room for improvement.  >60 Healthy dietary pattern, although there may be some specific behaviors that could be improved.    Nutrition Goals Re-Evaluation:   Nutrition Goals Discharge (Final Nutrition Goals Re-Evaluation):   Psychosocial: Target Goals: Acknowledge presence or absence of significant depression and/or stress, maximize coping skills, provide positive support system. Participant is able to verbalize types and ability to use techniques and skills needed for reducing stress and depression.  Initial Review & Psychosocial Screening:  Initial Psych Review & Screening - 07/16/23 1312       Initial Review   Current issues with Current Sleep Concerns;Current Stress Concerns    Source of Stress Concerns Unable to participate in former interests or hobbies      Family Dynamics   Good Support System? Yes    Comments Supportive husband      Barriers   Psychosocial barriers to participate in program The patient should benefit from training in stress management and relaxation.      Screening Interventions    Interventions Encouraged to exercise;Provide feedback about the scores to participant    Expected Outcomes Short Term goal: Utilizing psychosocial counselor, staff and physician to assist with identification of specific Stressors or current issues interfering with healing process. Setting desired goal for each stressor or current issue identified.;Long Term Goal: Stressors or current issues are controlled or eliminated.;Short Term goal: Identification and review with participant of any Quality of Life or Depression concerns found by scoring the questionnaire.;Long Term goal: The participant improves quality of Life and PHQ9 Scores as seen by post scores and/or verbalization of changes             Quality of Life Scores:  Scores of 19 and below usually indicate a poorer quality of life in these areas.  A difference of  2-3 points is a clinically meaningful difference.  A difference of 2-3 points in the total score of the Quality of Life Index has been associated with significant improvement in overall quality of life, self-image, physical symptoms, and general health in studies assessing change in quality of life.  PHQ-9: Review Flowsheet  More data exists      07/16/2023 12/29/2022 12/07/2022 08/28/2022 09/30/2021  Depression screen PHQ 2/9  Decreased Interest 0 0 0 0 0  Down, Depressed, Hopeless 0 0 0 0 0  PHQ - 2 Score 0 0 0 0 0  Altered sleeping 2 3 2  - -  Tired, decreased energy 1 2 2  - -  Change in appetite 1 0 1 - -  Feeling bad or failure about yourself  0 0 0 - -  Trouble concentrating 0 0 0 - -  Moving slowly or fidgety/restless 0 0 0 - -  Suicidal thoughts 0 0 0 - -  PHQ-9 Score 4 5 5  - -  Difficult doing work/chores Somewhat difficult Somewhat difficult Not difficult at all - -    Details           Interpretation of Total Score  Total Score Depression Severity:  1-4 = Minimal depression, 5-9 = Mild depression, 10-14 = Moderate depression, 15-19 = Moderately severe  depression, 20-27 = Severe depression   Psychosocial Evaluation and Intervention:  Psychosocial Evaluation - 07/16/23 1316       Psychosocial Evaluation & Interventions   Interventions Encouraged to exercise with the program and follow exercise prescription    Comments Graisyn is not sleeping well. She has tried Rx meds in the past but couldn't tolerate. She is hoping exercise will help with sleep. Donalda is also discouraged that she doesn't have enough energy to walk far distances. 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    Expected Outcomes For Ariyonna to particiapte in [place] rehab with decreased stress related to her lung disease and more quality restful sleep.    Continue Psychosocial Services  No Follow up required             Psychosocial Re-Evaluation:   Psychosocial Discharge (Final Psychosocial Re-Evaluation):   Education: Education Goals: Education classes will be provided on a weekly basis, covering required topics. Participant will state understanding/return demonstration of topics presented.  Learning Barriers/Preferences:  Learning Barriers/Preferences - 07/16/23 1356       Learning Barriers/Preferences   Learning Barriers None    Learning Preferences Verbal Instruction;Written Material;Video;Individual Instruction             Education Topics: Know Your Numbers Group instruction that is supported by a PowerPoint presentation. Instructor discusses importance of knowing and understanding resting, exercise, and post-exercise oxygen saturation, heart rate, and blood pressure. Oxygen saturation, heart rate, blood pressure, rating of perceived exertion, and dyspnea are reviewed along with a normal range for these values.    Exercise for the Pulmonary Patient Group instruction that is supported by a PowerPoint presentation. Instructor discusses benefits of exercise, core components of  exercise, frequency, duration, and intensity of an exercise routine, importance of utilizing pulse oximetry during exercise, safety while exercising, and options of places to exercise outside of rehab.    MET Level  Group instruction provided by PowerPoint, verbal discussion, and written material to support subject matter. Instructor reviews what METs are and how to increase METs.    Pulmonary Medications Verbally interactive group education provided by instructor with focus on inhaled medications and proper administration.   Anatomy and Physiology of the Respiratory System Group instruction provided by PowerPoint, verbal discussion, and written material to support subject matter. Instructor reviews respiratory cycle and anatomical components of the respiratory system and their functions. Instructor also reviews differences in obstructive and restrictive respiratory diseases with examples of each.    Oxygen Safety Group instruction provided by PowerPoint, verbal discussion, and written material to support subject matter. There is an overview of "What is Oxygen" and "Why do we need it".  Instructor also reviews how to create a safe environment for oxygen use, the importance of using oxygen as prescribed, and the risks of noncompliance. There is a brief discussion on traveling with oxygen and resources the patient may utilize.   Oxygen Use Group instruction provided by PowerPoint, verbal discussion, and written material to discuss how supplemental oxygen is prescribed and different types of oxygen supply systems.  Resources for more information are provided.    Breathing Techniques Group instruction that is supported by demonstration and informational handouts. Instructor discusses the benefits of pursed lip and diaphragmatic breathing and detailed demonstration on how to perform both.     Risk Factor Reduction Group instruction that is supported by a PowerPoint presentation. Instructor  discusses the definition of a risk factor, different risk factors for pulmonary disease, and how the heart and lungs work together.   Pulmonary Diseases Group instruction provided by PowerPoint, verbal discussion, and written material to support subject matter. Instructor gives an overview of the different type of pulmonary diseases. There is also a discussion on risk factors and symptoms as well as ways to manage the diseases.   Stress and Energy Conservation Group instruction provided by PowerPoint, verbal discussion, and written material to support subject matter. Instructor gives an overview of stress and the impact it can have on the body. Instructor also reviews ways to reduce stress. There is also a discussion on energy conservation and ways to conserve energy throughout the day.   Warning Signs and Symptoms Group instruction provided by PowerPoint, verbal discussion, and written material to support subject matter. Instructor reviews warning signs and symptoms of stroke, heart attack, cold and flu. Instructor also reviews ways to prevent the spread of infection.   Other Education Group or individual verbal, written, or video instructions that support the educational goals of the pulmonary rehab program.    Knowledge Questionnaire Score:  Knowledge Questionnaire Score - 07/16/23 1343       Knowledge Questionnaire Score   Pre Score 14/18             Core Components/Risk Factors/Patient Goals at Admission:  Personal Goals and Risk Factors at Admission - 07/16/23 1317       Core Components/Risk Factors/Patient Goals on Admission    Weight Management Yes;Weight Loss    Intervention Weight Management: Develop a combined nutrition and exercise program designed to reach desired caloric intake, while maintaining appropriate intake of nutrient and fiber, sodium and fats, and appropriate energy expenditure required for the weight goal.;Weight Management: Provide education and  appropriate resources to help participant work on and attain dietary goals.;Weight Management/Obesity: Establish reasonable short term and long term weight goals.;Obesity: Provide education and appropriate resources to help participant work on and attain dietary goals.    Expected Outcomes Short Term: Continue to assess and modify interventions until short term weight is achieved;Long Term: Adherence to nutrition and physical activity/exercise program aimed toward attainment of established weight goal;Weight Loss: Understanding of general recommendations for a balanced deficit meal plan, which promotes 1-2 lb weight loss per week and includes a negative energy balance of [phone] kcal/d;Understanding of distribution of calorie intake throughout the day with the consumption of 4-5 meals/snacks;Understanding recommendations for meals to include 15-35% energy as protein, 25-35% energy from fat, 35-60% energy from carbohydrates, less than 200mg  of dietary cholesterol, 20-35 gm of total fiber daily    Improve shortness of breath with ADL's Yes    Intervention Provide education, individualized exercise plan and daily activity instruction to help decrease symptoms of SOB with activities of daily living.    Expected Outcomes Short Term: Improve cardiorespiratory fitness to achieve a reduction of symptoms when performing ADLs;Long Term: Be able to perform more ADLs without symptoms or delay the onset of symptoms             Core Components/Risk Factors/Patient Goals Review:    Core Components/Risk Factors/Patient Goals at  Discharge (Final Review):    ITP Comments:   Comments: Dr. Mechele Collin is Medical Director for Pulmonary Rehab at [facility].

## 2023-07-16 NOTE — Progress Notes (Signed)
Julie [name] 82 y.o. female  Pulmonary Rehab Orientation Note  This patient who was referred to Pulmonary Rehab by Dr. Everardo All with the diagnosis of shortness of breath arrived today in Cardiac and Pulmonary Rehab. She  arrived ambulatory with normal gait. She  does not carry portable oxygen. [place] never uses oxygen. Color good, skin warm and dry. Patient is oriented to time and place. Patient's medical history, psychosocial health, and medications reviewed.   Psychosocial assessment reveals patient lives with spouse. Prim is currently retired. Patient hobbies include spending time with others and traveling . Patient reports her stress level is low. Areas of stress/anxiety include health. Patient does exhibit signs of depression. Signs of depression include sadness and difficulty maintaining sleep and fatigue. PHQ2/9 score 0/4. Gianelly shows good  coping skills with positive outlook on life. Offered emotional support and reassurance. Will continue to monitor and evaluate progress toward psychosocial goal(s) of increased quality of sleep and decreased fatigue.   Physical assessment reveals heart rate is normal, breath sounds clear to auscultation, no wheezes, rales, or rhonchi. Grip strength equal, strong. Distal pulses present. Monsserrat reports she does take medications as prescribed. Patient states she follows a regular  diet. The patient has been trying to lose weight through a healthy diet and exercise program.Pt's weight will be monitored closely.   Demonstration and practice of PLB using pulse oximeter. Keita able to return demonstration satisfactorily. Safety and hand hygiene in the exercise area reviewed with patient. Anaisa voices understanding of the information reviewed. Department expectations discussed with patient and achievable goals were set. The patient shows enthusiasm about attending the program and we look forward to working with Harriett Sine. Dewayne completed a 6 min walk test today and is  scheduled to begin exercise on 9/19 at 1315.   1300-1400 [name], RN, BSN

## 2023-07-20 ENCOUNTER — Telehealth (HOSPITAL_BASED_OUTPATIENT_CLINIC_OR_DEPARTMENT_OTHER): Payer: Self-pay | Admitting: Pulmonary Disease

## 2023-07-21 NOTE — Telephone Encounter (Signed)
[facility] has the order and will contact the patient

## 2023-07-21 NOTE — Telephone Encounter (Signed)
I have sent it to [facility] high priority

## 2023-07-22 ENCOUNTER — Encounter (HOSPITAL_COMMUNITY)
Admission: RE | Admit: 2023-07-22 | Discharge: 2023-07-22 | Disposition: A | Discharge status: Home / Self Care | Payer: PPO | Source: Ambulatory Visit | Attending: Pulmonary Disease | Admitting: Pulmonary Disease

## 2023-07-22 DIAGNOSIS — R0602 Shortness of breath: Secondary | ICD-10-CM | POA: Diagnosis not present

## 2023-07-22 NOTE — Progress Notes (Signed)
Daily Session Note  Patient Details  Name: Julie [name] MRN: [id number] Date of Birth: 07/16/1941 Referring Provider:   Doristine Devoid Pulmonary Rehab Walk Test from 07/16/2023 in [facility]  Referring Provider Everardo All       Encounter Date: 07/22/2023  Check In:  Session Check In - 07/22/23 1507       Check-In   Supervising physician immediately available to respond to emergencies [facility] MD immediately available    Physician(s) Carlos Levering, NP    Location [facility]    Staff Present [name], RN, [name], MS, ACSM-CEP, Exercise Physiologist;Maronda Caison Dionisio Paschal, ACSM-CEP, Exercise Physiologist;Casey Katrinka Blazing, RT    Virtual Visit No    Medication changes reported     No    Tobacco Cessation No Change    Warm-up and Cool-down Performed as group-led instruction    Resistance Training Performed Yes    VAD Patient? No    PAD/SET Patient? No      Pain Assessment   Currently in Pain? No/denies             Capillary Blood Glucose: No results found for this or any previous visit (from the past 24 hour(s)).    Social History   Tobacco Use  Smoking Status Never   Passive exposure: Past  Smokeless Tobacco Never    Goals Met:  Exercise tolerated well No report of concerns or symptoms today Strength training completed today  Goals Unmet:  Not Applicable  Comments: Service time is from 1308 to 1445.    Dr. Mechele Collin is Medical Director for Pulmonary Rehab at [facility].

## 2023-07-27 ENCOUNTER — Encounter (HOSPITAL_COMMUNITY): Payer: PPO

## 2023-07-27 NOTE — Progress Notes (Signed)
07/27/2023  Patient ID: Julie [name], female   DOB: [birth year], 82 y.o.   MRN: [id number]  Pharmacy Quality Measure Review  This patient is appearing on a report for being at risk of failing the adherence measure for cholesterol (statin) medications this calendar year.   Medication: Atorvastatin 20mg  Last fill date: 05/07/23 for 30 day supply  Insurance report was not up to date. No action needed at this time.  LF 06/09/23 90DS.  Sherrill Raring, PharmD Clinical Pharmacist [phone]

## 2023-07-29 ENCOUNTER — Encounter (HOSPITAL_COMMUNITY)
Admission: RE | Admit: 2023-07-29 | Discharge: 2023-07-29 | Disposition: A | Discharge status: Home / Self Care | Payer: PPO | Source: Ambulatory Visit | Attending: Pulmonary Disease | Admitting: Pulmonary Disease

## 2023-07-29 DIAGNOSIS — R0602 Shortness of breath: Secondary | ICD-10-CM | POA: Diagnosis not present

## 2023-07-29 NOTE — Progress Notes (Signed)
Daily Session Note  Patient Details  Name: DEBORA MEKONNEN MRN: [id number] Date of Birth: 1941/04/11 Referring Provider:   Doristine Devoid Pulmonary Rehab Walk Test from 07/16/2023 in [facility]  Referring Provider Everardo All       Encounter Date: 07/29/2023  Check In:  Session Check In - 07/29/23 1327       Check-In   Supervising physician immediately available to respond to emergencies [facility] MD immediately available    Physician(s) Edd Fabian, NP    Location [facility]    Staff Present [name], RN, [name], MS, ACSM-CEP, Exercise Physiologist;[name], ACSM-CEP, Exercise Physiologist;Florenda Watt Katrinka Blazing, RT    Virtual Visit No    Medication changes reported     No    Tobacco Cessation No Change    Warm-up and Cool-down Performed as group-led instruction    Resistance Training Performed Yes    VAD Patient? No    PAD/SET Patient? No      Pain Assessment   Currently in Pain? No/denies    Multiple Pain Sites No             Capillary Blood Glucose: No results found for this or any previous visit (from the past 24 hour(s)).    Social History   Tobacco Use  Smoking Status Never   Passive exposure: Past  Smokeless Tobacco Never    Goals Met:  Proper associated with RPD/PD & O2 Sat Independence with exercise equipment Exercise tolerated well No report of concerns or symptoms today Strength training completed today  Goals Unmet:  Not Applicable  Comments: Service time is from 1312 to 1450.    Dr. Mechele Collin is Medical Director for Pulmonary Rehab at [facility].

## 2023-08-03 ENCOUNTER — Encounter (HOSPITAL_COMMUNITY)
Admission: RE | Admit: 2023-08-03 | Discharge: 2023-08-03 | Disposition: A | Discharge status: Home / Self Care | Payer: PPO | Source: Ambulatory Visit | Attending: Pulmonary Disease | Admitting: Pulmonary Disease

## 2023-08-03 VITALS — Wt 132.5 lb

## 2023-08-03 DIAGNOSIS — Z5189 Encounter for other specified aftercare: Secondary | ICD-10-CM | POA: Insufficient documentation

## 2023-08-03 DIAGNOSIS — R0602 Shortness of breath: Secondary | ICD-10-CM | POA: Insufficient documentation

## 2023-08-03 NOTE — Progress Notes (Signed)
Daily Session Note  Patient Details  Name: VEDANSHI MASSARO MRN: [id number] Date of Birth: Sep 21, 1941 Referring Provider:   Doristine Devoid Pulmonary Rehab Walk Test from 07/16/2023 in [facility]  Referring Provider Everardo All       Encounter Date: 08/03/2023  Check In:  Session Check In - 08/03/23 1334       Check-In   Supervising physician immediately available to respond to emergencies [facility] MD immediately available    Physician(s) Robin Searing, NP    Location [facility]    Staff Present [name], RN, [name], MS, ACSM-CEP, Exercise Physiologist;Timmey Lamba Dionisio Paschal, ACSM-CEP, Exercise Physiologist;Casey Hermine Messick Belarus, RD, LDN    Virtual Visit No    Medication changes reported     No    Tobacco Cessation No Change    Warm-up and Cool-down Performed as group-led instruction    Resistance Training Performed Yes    VAD Patient? No    PAD/SET Patient? No      Pain Assessment   Currently in Pain? No/denies    Multiple Pain Sites No             Capillary Blood Glucose: No results found for this or any previous visit (from the past 24 hour(s)).   Exercise Prescription Changes - 08/03/23 1500       Response to Exercise   Blood Pressure (Admit) 122/70    Blood Pressure (Exercise) 158/76    Blood Pressure (Exit) 102/62    Heart Rate (Admit) 85 bpm    Heart Rate (Exercise) 103 bpm    Heart Rate (Exit) 84 bpm    Oxygen Saturation (Admit) 93 %    Oxygen Saturation (Exercise) 94 %    Oxygen Saturation (Exit) 93 %    Rating of Perceived Exertion (Exercise) 13    Perceived Dyspnea (Exercise) 3    Duration Continue with 30 min of aerobic exercise without signs/symptoms of physical distress.    Intensity THRR unchanged      Progression   Progression Continue to progress workloads to maintain intensity without signs/symptoms of physical distress.    Average METs 3.7      Resistance  Training   Training Prescription Yes    Weight blue bands    Reps 10-15    Time 10 Minutes      Recumbant Bike   Level 2    RPM 60    Minutes 15    METs 2.6      Recumbant Elliptical   Level 2    Minutes 15    METs 3.7             Social History   Tobacco Use  Smoking Status Never   Passive exposure: Past  Smokeless Tobacco Never    Goals Met:  Independence with exercise equipment Exercise tolerated well No report of concerns or symptoms today Strength training completed today  Goals Unmet:  Not Applicable  Comments: Service time is from 1312 to 1442.    Dr. Mechele Collin is Medical Director for Pulmonary Rehab at [facility]

## 2023-08-04 NOTE — Progress Notes (Signed)
Pulmonary Individual Treatment Plan  Patient Details  Name: Julie [name] MRN: [id number] Date of Birth: [birth year] Referring Provider:   Doristine Devoid Pulmonary Rehab Walk Test from 07/16/2023 in [facility]  Referring Provider Everardo All       Initial Encounter Date:  Flowsheet Row Pulmonary Rehab Walk Test from 07/16/2023 in [facility]  Date 07/16/23       Visit Diagnosis: Shortness of breath  Patient's Home Medications on Admission:   Current Outpatient Medications:    albuterol (VENTOLIN HFA) 108 (90 Base) MCG/ACT inhaler, INHALE 2 PUFFS INTO THE LUNGS EVERY 6 HOURS AS NEEDED FOR WHEEZING OR SHORTNESS OF BREATH, Disp: 6.7 g, Rfl: 0   atorvastatin (LIPITOR) 20 MG tablet, Take 1 tablet (20 mg total) by mouth daily., Disp: 90 tablet, Rfl: 3   gabapentin (NEURONTIN) 100 MG capsule, Take 100 mg by mouth as needed., Disp: , Rfl:    levothyroxine (SYNTHROID) 50 MCG tablet, Take 1 tablet (50 mcg total) by mouth daily., Disp: 90 tablet, Rfl: 0   losartan (COZAAR) 50 MG tablet, Take 1 tablet (50 mg total) by mouth daily., Disp: 90 tablet, Rfl: 3   VITAMIN E PO, Take by mouth., Disp: , Rfl:   Past Medical History: 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   Other bursitis disorders    history   Personal history of colonic polyps    PONV (postoperative nausea and vomiting)    Pre-diabetes     Pure hypercholesterolemia    Spondylolisthesis, cervical region    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    skin -spots on face, forehead and leg removed   SVD (spontaneous vaginal delivery)    x 1   Unspecified disorder of thyroid    Unspecified hypothyroidism     Tobacco Use: Social History   Tobacco Use  Smoking Status Never   Passive exposure: Past  Smokeless Tobacco Never    Labs: Review Flowsheet  More data exists      Latest Ref Rng & Units 01/16/2019 12/15/2019 09/02/2020 09/22/2021 02/09/2023  Labs for ITP Cardiac and Pulmonary Rehab  Cholestrol 100 - 199 mg/dL - - 213  - 086   LDL (calc) 0 - 99 mg/dL - - 578  - 469   HDL-C >39 mg/dL - - 49  - 51   Trlycerides 0 - 149 mg/dL - - 76  - 629   Hemoglobin A1c 4.0 - 5.6 % 5.6  6.0  - 5.6  -    Details            Capillary Blood Glucose: Lab Results  Component Value Date   GLUCAP 99 01/23/2017   GLUCAP 97 01/22/2017   GLUCAP 166 (H) 12/30/2012     Pulmonary Assessment Scores:  Pulmonary Assessment Scores  Row Name 07/16/23 1341         ADL UCSD   ADL Phase Entry     SOB Score total 60       CAT Score   CAT Score 19       mMRC Score   mMRC Score 4             UCSD: Self-administered rating of dyspnea associated with activities of daily living (ADLs) 6-point scale (0 = "not at all" to 5 = "maximal or unable to do because of breathlessness")  Scoring Scores range from 0 to 120.  Minimally important difference is 5 units  CAT: CAT can identify the health impairment of COPD patients and is better correlated with disease progression.  CAT has a scoring range of zero to 40. The CAT score is classified into four groups of low (less than 10), medium (10 - 20), high (21-30) and very high (31-40) based on the impact level of disease on health status. A CAT score over 10 suggests significant symptoms.  A worsening CAT score could be explained by an exacerbation, poor medication adherence, poor  inhaler technique, or progression of COPD or comorbid conditions.  CAT MCID is 2 points  mMRC: mMRC (Modified Medical Research Council) Dyspnea Scale is used to assess the degree of baseline functional disability in patients of respiratory disease due to dyspnea. No minimal important difference is established. A decrease in score of 1 point or greater is considered a positive change.   Pulmonary Function Assessment:  Pulmonary Function Assessment - 07/16/23 1311       Breath   Bilateral Breath Sounds Clear    Shortness of Breath Yes;Limiting activity             Exercise Target Goals: Exercise Program Goal: Individual exercise prescription set using results from initial 6 min walk test and THRR while considering  patient's activity barriers and safety.   Exercise Prescription Goal: Initial exercise prescription builds to 30-45 minutes a day of aerobic activity, 2-3 days per week.  Home exercise guidelines will be given to patient during program as part of exercise prescription that the participant will acknowledge.  Activity Barriers & Risk Stratification:  Activity Barriers & Cardiac Risk Stratification - 07/16/23 1310       Activity Barriers & Cardiac Risk Stratification   Activity Barriers History of Falls;Deconditioning;Muscular Weakness;Shortness of Breath;Balance Concerns;Back Problems             6 Minute Walk:  6 Minute Walk     Row Name 07/16/23 1400         6 Minute Walk   Phase Initial     Distance 823 feet     Walk Time 6 minutes     # of Rest Breaks 0     MPH 1.56     METS 1.2     RPE 11     Perceived Dyspnea  1     VO2 Peak 4.19     Resting HR 81 bpm     Resting BP 130/72     Resting Oxygen Saturation  94 %     Exercise Oxygen Saturation  during 6 min walk 92 %     Max Ex. HR 105 bpm     Max Ex. BP 132/82     2 Minute Post BP 128/80       Interval HR   1 Minute HR 95     2 Minute HR 107  3 Minute HR 103     4 Minute HR 105     5  Minute HR 101     6 Minute HR 100     2 Minute Post HR 86     Interval Heart Rate? Yes       Interval Oxygen   Interval Oxygen? Yes     Baseline Oxygen Saturation % 94 %     1 Minute Oxygen Saturation % 93 %     1 Minute Liters of Oxygen 0 L     2 Minute Oxygen Saturation % 94 %     2 Minute Liters of Oxygen 0 L     3 Minute Oxygen Saturation % 97 %     3 Minute Liters of Oxygen 0 L     4 Minute Oxygen Saturation % 92 %     4 Minute Liters of Oxygen 0 L     5 Minute Oxygen Saturation % 94 %     5 Minute Liters of Oxygen 0 L     6 Minute Oxygen Saturation % 95 %     6 Minute Liters of Oxygen 0 L     2 Minute Post Oxygen Saturation % 97 %     2 Minute Post Liters of Oxygen 0 L              Oxygen Initial Assessment:  Oxygen Initial Assessment - 07/16/23 1310       Home Oxygen   Home Oxygen Device None    Sleep Oxygen Prescription None    Home Exercise Oxygen Prescription None    Home Resting Oxygen Prescription None      Initial 6 min Walk   Oxygen Used None      Program Oxygen Prescription   Program Oxygen Prescription None      Intervention   Short Term Goals To learn and understand importance of maintaining oxygen saturations>88%;To learn and demonstrate proper use of respiratory medications;To learn and understand importance of monitoring SPO2 with pulse oximeter and demonstrate accurate use of the pulse oximeter.;To learn and demonstrate proper pursed lip breathing techniques or other breathing techniques.     Long  Term Goals Maintenance of O2 saturations>88%;Compliance with respiratory medication;Demonstrates proper use of MDI's;Exhibits proper breathing techniques, such as pursed lip breathing or other method taught during program session;Verbalizes importance of monitoring SPO2 with pulse oximeter and return demonstration             Oxygen Re-Evaluation:  Oxygen Re-Evaluation     Row Name 07/27/23 0934             Program Oxygen Prescription    Program Oxygen Prescription None         Home Oxygen   Home Oxygen Device None       Sleep Oxygen Prescription None       Home Exercise Oxygen Prescription None       Home Resting Oxygen Prescription None         Goals/Expected Outcomes   Short Term Goals To learn and understand importance of maintaining oxygen saturations>88%;To learn and demonstrate proper use of respiratory medications;To learn and understand importance of monitoring SPO2 with pulse oximeter and demonstrate accurate use of the pulse oximeter.;To learn and demonstrate proper pursed lip breathing techniques or other breathing techniques.        Long  Term Goals Maintenance of O2 saturations>88%;Compliance with respiratory medication;Demonstrates proper use of MDI's;Exhibits proper breathing techniques, such as pursed  lip breathing or other method taught during program session;Verbalizes importance of monitoring SPO2 with pulse oximeter and return demonstration       Goals/Expected Outcomes Compliance and understanding of oxygen saturation monitoring and breathing techniques to decrease shortness of breath                Oxygen Discharge (Final Oxygen Re-Evaluation):  Oxygen Re-Evaluation - 07/27/23 0934       Program Oxygen Prescription   Program Oxygen Prescription None      Home Oxygen   Home Oxygen Device None    Sleep Oxygen Prescription None    Home Exercise Oxygen Prescription None    Home Resting Oxygen Prescription None      Goals/Expected Outcomes   Short Term Goals To learn and understand importance of maintaining oxygen saturations>88%;To learn and demonstrate proper use of respiratory medications;To learn and understand importance of monitoring SPO2 with pulse oximeter and demonstrate accurate use of the pulse oximeter.;To learn and demonstrate proper pursed lip breathing techniques or other breathing techniques.     Long  Term Goals Maintenance of O2 saturations>88%;Compliance with respiratory  medication;Demonstrates proper use of MDI's;Exhibits proper breathing techniques, such as pursed lip breathing or other method taught during program session;Verbalizes importance of monitoring SPO2 with pulse oximeter and return demonstration    Goals/Expected Outcomes Compliance and understanding of oxygen saturation monitoring and breathing techniques to decrease shortness of breath             Initial Exercise Prescription:  Initial Exercise Prescription - 07/16/23 1400       Date of Initial Exercise RX and Referring Provider   Date 07/16/23    Referring Provider Everardo All    Expected Discharge Date 10/14/23      Recumbant Bike   Level 1    Minutes 15    METs 1.5      Recumbant Elliptical   Level 1    Minutes 15    METs 2      Prescription Details   Frequency (times per week) 2    Duration Progress to 30 minutes of continuous aerobic without signs/symptoms of physical distress      Intensity   THRR 40-80% of Max Heartrate 55-110    Ratings of Perceived Exertion 11-13    Perceived Dyspnea 0-4      Progression   Progression Continue to progress workloads to maintain intensity without signs/symptoms of physical distress.      Resistance Training   Training Prescription Yes    Weight red bands    Reps 10-15             Perform Capillary Blood Glucose checks as needed.  Exercise Prescription Changes:   Exercise Prescription Changes     Row Name 08/03/23 1500             Response to Exercise   Blood Pressure (Admit) 122/70       Blood Pressure (Exercise) 158/76       Blood Pressure (Exit) 102/62       Heart Rate (Admit) 85 bpm       Heart Rate (Exercise) 103 bpm       Heart Rate (Exit) 84 bpm       Oxygen Saturation (Admit) 93 %       Oxygen Saturation (Exercise) 94 %       Oxygen Saturation (Exit) 93 %       Rating of Perceived Exertion (Exercise) 13  Perceived Dyspnea (Exercise) 3       Duration Continue with 30 min of aerobic exercise  without signs/symptoms of physical distress.       Intensity THRR unchanged         Progression   Progression Continue to progress workloads to maintain intensity without signs/symptoms of physical distress.       Average METs 3.7         Resistance Training   Training Prescription Yes       Weight blue bands       Reps 10-15       Time 10 Minutes         Recumbant Bike   Level 2       RPM 60       Minutes 15       METs 2.6         Recumbant Elliptical   Level 2       Minutes 15       METs 3.7                Exercise Comments:   Exercise Comments     Row Name 07/22/23 1509           Exercise Comments Pt completed first day of group exercise. She exercised on recumbent bike for 15 min, level 1, METs 1.9. She then exercised on the recumbent elliptical, level 1, METs 3.1 for 15 min. Pt tolerated well. Able to perform warm up and cool down exercises with chair support. Discussed METs with good restrictions.                Exercise Goals and Review:   Exercise Goals     Row Name 07/16/23 1310             Exercise Goals   Increase Physical Activity Yes       Intervention Provide advice, education, support and counseling about physical activity/exercise needs.;Develop an individualized exercise prescription for aerobic and resistive training based on initial evaluation findings, risk stratification, comorbidities and participant's personal goals.       Expected Outcomes Short Term: Attend rehab on a regular basis to increase amount of physical activity.;Long Term: Exercising regularly at least 3-5 days a week.;Long Term: Add in home exercise to make exercise part of routine and to increase amount of physical activity.       Increase Strength and Stamina Yes       Intervention Provide advice, education, support and counseling about physical activity/exercise needs.;Develop an individualized exercise prescription for aerobic and resistive training based on initial  evaluation findings, risk stratification, comorbidities and participant's personal goals.       Expected Outcomes Short Term: Increase workloads from initial exercise prescription for resistance, speed, and METs.;Short Term: Perform resistance training exercises routinely during rehab and add in resistance training at home;Long Term: Improve cardiorespiratory fitness, muscular endurance and strength as measured by increased METs and functional capacity ( )       Able to understand and use rate of perceived exertion (RPE) scale Yes       Intervention Provide education and explanation on how to use RPE scale       Expected Outcomes Short Term: Able to use RPE daily in rehab to express subjective intensity level;Long Term:  Able to use RPE to guide intensity level when exercising independently       Able to understand and use Dyspnea scale Yes  Intervention Provide education and explanation on how to use Dyspnea scale       Expected Outcomes Short Term: Able to use Dyspnea scale daily in rehab to express subjective sense of shortness of breath during exertion;Long Term: Able to use Dyspnea scale to guide intensity level when exercising independently       Knowledge and understanding of Target Heart Rate Range (THRR) Yes       Intervention Provide education and explanation of THRR including how the numbers were predicted and where they are located for reference       Expected Outcomes Short Term: Able to state/look up THRR;Short Term: Able to use daily as guideline for intensity in rehab;Long Term: Able to use THRR to govern intensity when exercising independently       Understanding of Exercise Prescription Yes       Intervention Provide education, explanation, and written materials on patient's individual exercise prescription       Expected Outcomes Short Term: Able to explain program exercise prescription;Long Term: Able to explain home exercise prescription to exercise independently                 Exercise Goals Re-Evaluation :  Exercise Goals Re-Evaluation     Row Name 07/27/23 0932             Exercise Goal Re-Evaluation   Exercise Goals Review Increase Physical Activity;Able to understand and use Dyspnea scale;Understanding of Exercise Prescription;Increase Strength and Stamina;Knowledge and understanding of Target Heart Rate Range (THRR);Able to understand and use rate of perceived exertion (RPE) scale       Comments Pt has completed one exercise session. She exercised on recumbent bike for 15 min, level 1, METs 1.9. She then exercised on the recumbent elliptical, level 1, METs 3.1 for 15 min. Pt tolerated well. Able to perform warm up and cool down exercises with chair support. Will progress pt as tolerated.       Expected Outcomes Through exercise at rehab and home, the patient will decrease shortness of breath with daily activities and feel confident in carrying out an exercise regimen at home                Discharge Exercise Prescription (Final Exercise Prescription Changes):  Exercise Prescription Changes - 08/03/23 1500       Response to Exercise   Blood Pressure (Admit) 122/70    Blood Pressure (Exercise) 158/76    Blood Pressure (Exit) 102/62    Heart Rate (Admit) 85 bpm    Heart Rate (Exercise) 103 bpm    Heart Rate (Exit) 84 bpm    Oxygen Saturation (Admit) 93 %    Oxygen Saturation (Exercise) 94 %    Oxygen Saturation (Exit) 93 %    Rating of Perceived Exertion (Exercise) 13    Perceived Dyspnea (Exercise) 3    Duration Continue with 30 min of aerobic exercise without signs/symptoms of physical distress.    Intensity THRR unchanged      Progression   Progression Continue to progress workloads to maintain intensity without signs/symptoms of physical distress.    Average METs 3.7      Resistance Training   Training Prescription Yes    Weight blue bands    Reps 10-15    Time 10 Minutes      Recumbant Bike   Level 2    RPM 60     Minutes 15    METs 2.6      Recumbant Elliptical  Level 2    Minutes 15    METs 3.7             Nutrition:  Target Goals: Understanding of nutrition guidelines, daily intake of sodium 1500mg , cholesterol 200mg , calories 30% from fat and 7% or less from saturated fats, daily to have 5 or more servings of fruits and vegetables.  Biometrics:  Pre Biometrics - 07/16/23 1305       Pre Biometrics   Grip Strength 10 kg              Nutrition Therapy Plan and Nutrition Goals:  Nutrition Therapy & Goals - 07/30/23 1434       Nutrition Therapy   Diet Heart Healthy Diet    Drug/Food Interactions Statins/Certain Fruits      Personal Nutrition Goals   Nutrition Goal Patient to improve diet quality by using the plate method as a guide for meal planning to include lean protein/plant protein, fruits, vegetables, whole grains, nonfat dairy as part of a well-balanced diet.    Personal Goal #2 Patient to identify strategies for weight loss of 0.5-2.0# per week.    Comments Julie [name] reports motivation to lose weight with goal weight of 100#. She reports craving sweets/carbohydrates and over-snacking at night. Her lipid panel remains elevated. Patient will benefit from participation in pulmonary rehab for nutrition, exercise, and lifestyle modification.      Intervention Plan   Intervention Prescribe, educate and counsel regarding individualized specific dietary modifications aiming towards targeted core components such as weight, hypertension, lipid management, diabetes, heart failure and other comorbidities.;Nutrition handout(s) given to patient.    Expected Outcomes Short Term Goal: Understand basic principles of dietary content, such as calories, fat, sodium, cholesterol and nutrients.;Long Term Goal: Adherence to prescribed nutrition plan.             Nutrition Assessments:  Nutrition Assessments - 07/29/23 0949       Rate Your Plate Scores   Pre Score 54             MEDIFICTS Score Key: >=70 Need to make dietary changes  40-70 Heart Healthy Diet <= 40 Therapeutic Level Cholesterol Diet  Flowsheet Row PULMONARY REHAB OTHER RESPIRATORY from [facility]/22/2023 in [facility]  Picture Your Plate Total Score on Admission 54      Picture Your Plate Scores: <32 Unhealthy dietary pattern with much room for improvement. 41-50 Dietary pattern unlikely to meet recommendations for good health and room for improvement. 51-60 More healthful dietary pattern, with some room for improvement.  >60 Healthy dietary pattern, although there may be some specific behaviors that could be improved.    Nutrition Goals Re-Evaluation:  Nutrition Goals Re-Evaluation     Row Name 07/30/23 1434             Goals   Current Weight 134 lb 14.7 oz (61.2 kg)       Comment LDL 168, cholesterol 241       Expected Outcome Damariz reports motivation to lose weight with goal weight of 100#. She reports craving sweets/carbohydrates and over-snacking at night. Her lipid panel remains elevated. Patient will benefit from participation in pulmonary rehab for nutrition, exercise, and lifestyle modification.                Nutrition Goals Discharge (Final Nutrition Goals Re-Evaluation):  Nutrition Goals Re-Evaluation - 07/30/23 1434       Goals   Current Weight 134 lb 14.7  oz (61.2 kg)    Comment LDL 168, cholesterol 241    Expected Outcome Julie [name] reports motivation to lose weight with goal weight of 100#. She reports craving sweets/carbohydrates and over-snacking at night. Her lipid panel remains elevated. Patient will benefit from participation in pulmonary rehab for nutrition, exercise, and lifestyle modification.             Psychosocial: Target Goals: Acknowledge presence or absence of significant depression and/or stress, maximize coping skills, provide positive support system. Participant is able to verbalize types  and ability to use techniques and skills needed for reducing stress and depression.  Initial Review & Psychosocial Screening:  Initial Psych Review & Screening - 07/16/23 1312       Initial Review   Current issues with Current Sleep Concerns;Current Stress Concerns    Source of Stress Concerns Unable to participate in former interests or hobbies      Family Dynamics   Good Support System? Yes    Comments Supportive husband      Barriers   Psychosocial barriers to participate in program The patient should benefit from training in stress management and relaxation.      Screening Interventions   Interventions Encouraged to exercise;Provide feedback about the scores to participant    Expected Outcomes Short Term goal: Utilizing psychosocial counselor, staff and physician to assist with identification of specific Stressors or current issues interfering with healing process. Setting desired goal for each stressor or current issue identified.;Long Term Goal: Stressors or current issues are controlled or eliminated.;Short Term goal: Identification and review with participant of any Quality of Life or Depression concerns found by scoring the questionnaire.;Long Term goal: The participant improves quality of Life and PHQ9 Scores as seen by post scores and/or verbalization of changes             Quality of Life Scores:  Scores of 19 and below usually indicate a poorer quality of life in these areas.  A difference of  2-3 points is a clinically meaningful difference.  A difference of 2-3 points in the total score of the Quality of Life Index has been associated with significant improvement in overall quality of life, self-image, physical symptoms, and general health in studies assessing change in quality of life.  PHQ-9: Review Flowsheet  More data exists      07/16/2023 12/29/2022 12/07/2022 08/28/2022 09/30/2021  Depression screen PHQ 2/9  Decreased Interest 0 0 0 0 0  Down, Depressed,  Hopeless 0 0 0 0 0  PHQ - 2 Score 0 0 0 0 0  Altered sleeping 2 3 2  - -  Tired, decreased energy 1 2 2  - -  Change in appetite 1 0 1 - -  Feeling bad or failure about yourself  0 0 0 - -  Trouble concentrating 0 0 0 - -  Moving slowly or fidgety/restless 0 0 0 - -  Suicidal thoughts 0 0 0 - -  PHQ-9 Score 4 5 5  - -  Difficult doing work/chores Somewhat difficult Somewhat difficult Not difficult at all - -    Details           Interpretation of Total Score  Total Score Depression Severity:  1-4 = Minimal depression, 5-9 = Mild depression, 10-14 = Moderate depression, 15-19 = Moderately severe depression, 20-27 = Severe depression   Psychosocial Evaluation and Intervention:  Psychosocial Evaluation - 07/16/23 1316       Psychosocial Evaluation & Interventions   Interventions Encouraged to exercise  with the program and follow exercise prescription    Comments Julie [name] is not sleeping well. She has tried Rx meds in the past but couldn't tolerate. She is hoping exercise will help with sleep. Julie [name] is also discouraged that she doesn't have enough energy to walk far distances. 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    Expected Outcomes For Julie [name] to particiapte in [place] [facility] rehab with decreased stress related to her lung disease and more quality restful sleep.    Continue Psychosocial Services  No Follow up required             Psychosocial Re-Evaluation:  Psychosocial Re-Evaluation     Row Name 07/28/23 1128             Psychosocial Re-Evaluation   Current issues with Current Sleep Concerns;Current Stress Concerns       Comments No changes since orientation. Julie [name] has completed 1 session.       Expected Outcomes For Julie [name] to continue to attend pulmonary rehab and to have a positive outlook and good coping skills to manage her stress. To get better quality of sleep.       Interventions Encouraged to  attend Pulmonary Rehabilitation for the exercise;Stress management education       Continue Psychosocial Services  No Follow up required                Psychosocial Discharge (Final Psychosocial Re-Evaluation):  Psychosocial Re-Evaluation - 07/28/23 1128       Psychosocial Re-Evaluation   Current issues with Current Sleep Concerns;Current Stress Concerns    Comments No changes since orientation. Julie [name] has completed 1 session.    Expected Outcomes For Julie [name] to continue to attend pulmonary rehab and to have a positive outlook and good coping skills to manage her stress. To get better quality of sleep.    Interventions Encouraged to attend Pulmonary Rehabilitation for the exercise;Stress management education    Continue Psychosocial Services  No Follow up required             Education: Education Goals: Education classes will be provided on a weekly basis, covering required topics. Participant will state understanding/return demonstration of topics presented.  Learning Barriers/Preferences:  Learning Barriers/Preferences - 07/16/23 1356       Learning Barriers/Preferences   Learning Barriers None    Learning Preferences Verbal Instruction;Written Material;Video;Individual Instruction             Education Topics: Know Your Numbers Group instruction that is supported by a PowerPoint presentation. Instructor discusses importance of knowing and understanding resting, exercise, and post-exercise oxygen saturation, heart rate, and blood pressure. Oxygen saturation, heart rate, blood pressure, rating of perceived exertion, and dyspnea are reviewed along with a normal range for these values.    Exercise for the Pulmonary Patient Group instruction that is supported by a PowerPoint presentation. Instructor discusses benefits of exercise, core components of exercise, frequency, duration, and intensity of an exercise routine, importance of utilizing pulse oximetry during exercise,  safety while exercising, and options of places to exercise outside of rehab.  Flowsheet Row PULMONARY REHAB OTHER RESPIRATORY from 07/29/2023 in [facility] for Heart, Vascular, & Lung Health  Date 07/29/23  Educator EP  Instruction Review Code 1- Verbalizes Understanding       MET Level  Group instruction provided by PowerPoint, verbal discussion, and written material to support subject matter. Instructor reviews what  METs are and how to increase METs.    Pulmonary Medications Verbally interactive group education provided by instructor with focus on inhaled medications and proper administration. Flowsheet Row PULMONARY REHAB OTHER RESPIRATORY from 07/22/2023 in [facility] for Heart, Vascular, & Lung Health  Date 07/22/23  Educator RT  Instruction Review Code 1- Verbalizes Understanding       Anatomy and Physiology of the Respiratory System Group instruction provided by PowerPoint, verbal discussion, and written material to support subject matter. Instructor reviews respiratory cycle and anatomical components of the respiratory system and their functions. Instructor also reviews differences in obstructive and restrictive respiratory diseases with examples of each.    Oxygen Safety Group instruction provided by PowerPoint, verbal discussion, and written material to support subject matter. There is an overview of "What is Oxygen" and "Why do we need it".  Instructor also reviews how to create a safe environment for oxygen use, the importance of using oxygen as prescribed, and the risks of noncompliance. There is a brief discussion on traveling with oxygen and resources the patient may utilize.   Oxygen Use Group instruction provided by PowerPoint, verbal discussion, and written material to discuss how supplemental oxygen is prescribed and different types of oxygen supply systems. Resources for more information are provided.    Breathing  Techniques Group instruction that is supported by demonstration and informational handouts. Instructor discusses the benefits of pursed lip and diaphragmatic breathing and detailed demonstration on how to perform both.     Risk Factor Reduction Group instruction that is supported by a PowerPoint presentation. Instructor discusses the definition of a risk factor, different risk factors for pulmonary disease, and how the heart and lungs work together.   Pulmonary Diseases Group instruction provided by PowerPoint, verbal discussion, and written material to support subject matter. Instructor gives an overview of the different type of pulmonary diseases. There is also a discussion on risk factors and symptoms as well as ways to manage the diseases.   Stress and Energy Conservation Group instruction provided by PowerPoint, verbal discussion, and written material to support subject matter. Instructor gives an overview of stress and the impact it can have on the body. Instructor also reviews ways to reduce stress. There is also a discussion on energy conservation and ways to conserve energy throughout the day.   Warning Signs and Symptoms Group instruction provided by PowerPoint, verbal discussion, and written material to support subject matter. Instructor reviews warning signs and symptoms of stroke, heart attack, cold and flu. Instructor also reviews ways to prevent the spread of infection.   Other Education Group or individual verbal, written, or video instructions that support the educational goals of the pulmonary rehab program.    Knowledge Questionnaire Score:  Knowledge Questionnaire Score - 07/16/23 1343       Knowledge Questionnaire Score   Pre Score 14/18             Core Components/Risk Factors/Patient Goals at Admission:  Personal Goals and Risk Factors at Admission - 07/16/23 1317       Core Components/Risk Factors/Patient Goals on Admission    Weight Management  Yes;Weight Loss    Intervention Weight Management: Develop a combined nutrition and exercise program designed to reach desired caloric intake, while maintaining appropriate intake of nutrient and fiber, sodium and fats, and appropriate energy expenditure required for the weight goal.;Weight Management: Provide education and appropriate resources to help participant work on and attain dietary goals.;Weight Management/Obesity: Establish reasonable short term and  long term weight goals.;Obesity: Provide education and appropriate resources to help participant work on and attain dietary goals.    Expected Outcomes Short Term: Continue to assess and modify interventions until short term weight is achieved;Long Term: Adherence to nutrition and physical activity/exercise program aimed toward attainment of established weight goal;Weight Loss: Understanding of general recommendations for a balanced deficit meal plan, which promotes 1-2 lb weight loss per week and includes a negative energy balance of [phone] kcal/d;Understanding of distribution of calorie intake throughout the day with the consumption of 4-5 meals/snacks;Understanding recommendations for meals to include 15-35% energy as protein, 25-35% energy from fat, 35-60% energy from carbohydrates, less than 200mg  of dietary cholesterol, 20-35 gm of total fiber daily    Improve shortness of breath with ADL's Yes    Intervention Provide education, individualized exercise plan and daily activity instruction to help decrease symptoms of SOB with activities of daily living.    Expected Outcomes Short Term: Improve cardiorespiratory fitness to achieve a reduction of symptoms when performing ADLs;Long Term: Be able to perform more ADLs without symptoms or delay the onset of symptoms             Core Components/Risk Factors/Patient Goals Review:   Goals and Risk Factor Review     Row Name 07/28/23 1130             Core Components/Risk Factors/Patient  Goals Review   Personal Goals Review Weight Management/Obesity;Improve shortness of breath with ADL's;Develop more efficient breathing techniques such as purse lipped breathing and diaphragmatic breathing and practicing self-pacing with activity.       Review Unable to assess. Julie [name] has completed 1 session.       Expected Outcomes For Julie [name] to lose weight, improve her shortness of breath with ADLs, develop more efficient breathing techniques, and learn how to self-pace.                Core Components/Risk Factors/Patient Goals at Discharge (Final Review):   Goals and Risk Factor Review - 07/28/23 1130       Core Components/Risk Factors/Patient Goals Review   Personal Goals Review Weight Management/Obesity;Improve shortness of breath with ADL's;Develop more efficient breathing techniques such as purse lipped breathing and diaphragmatic breathing and practicing self-pacing with activity.    Review Unable to assess. Julie [name] has completed 1 session.    Expected Outcomes For Julie [name] to lose weight, improve her shortness of breath with ADLs, develop more efficient breathing techniques, and learn how to self-pace.             ITP Comments:Pt is making expected progress toward Pulmonary Rehab goals after completing 3 sessions. Recommend continued exercise, life style modification, education, and utilization of breathing techniques to increase stamina and strength, while also decreasing shortness of breath with exertion.  Dr. Mechele Collin is Medical Director for Pulmonary Rehab at [facility].     Comments: Dr. Mechele Collin is Medical Director for Pulmonary Rehab at [facility].

## 2023-08-05 ENCOUNTER — Encounter (HOSPITAL_COMMUNITY)
Admission: RE | Admit: 2023-08-05 | Discharge: 2023-08-05 | Disposition: A | Discharge status: Home / Self Care | Payer: PPO | Source: Ambulatory Visit | Attending: Pulmonary Disease | Admitting: Pulmonary Disease

## 2023-08-05 DIAGNOSIS — R0602 Shortness of breath: Secondary | ICD-10-CM

## 2023-08-05 NOTE — Progress Notes (Signed)
Daily Session Note  Patient Details  Name: Julie [name] MRN: [id number] Date of Birth: Feb 14, 1941 Referring Provider:   Doristine Devoid Pulmonary Rehab Walk Test from 07/16/2023 in [facility] for Heart, Vascular, & Lung Health  Referring Provider Everardo All       Encounter Date: 08/05/2023  Check In:  Session Check In - 08/05/23 1430       Check-In   Supervising physician immediately available to respond to emergencies [facility] MD immediately available    Physician(s) Joni Reining, NP    Location [facility]    Staff Present [name], RN, [name], MS, ACSM-CEP, Exercise Physiologist;[name], ACSM-CEP, Exercise Physiologist;[name], RT    Virtual Visit No    Medication changes reported     No    Tobacco Cessation No Change    Warm-up and Cool-down Performed as group-led instruction    Resistance Training Performed Yes    VAD Patient? No    PAD/SET Patient? No      Pain Assessment   Currently in Pain? No/denies    Multiple Pain Sites No             Capillary Blood Glucose: No results found for this or any previous visit (from the past 24 hour(s)).    Social History   Tobacco Use  Smoking Status Never   Passive exposure: Past  Smokeless Tobacco Never    Goals Met:  Proper associated with RPD/PD & O2 Sat Independence with exercise equipment Exercise tolerated well No report of concerns or symptoms today Strength training completed today  Goals Unmet:  Not Applicable  Comments: Service time is from 1308 to 1500.    Dr. Mechele Collin is Medical Director for Pulmonary Rehab at [facility].

## 2023-08-09 DIAGNOSIS — L820 Inflamed seborrheic keratosis: Secondary | ICD-10-CM | POA: Diagnosis not present

## 2023-08-09 DIAGNOSIS — Z85828 Personal history of other malignant neoplasm of skin: Secondary | ICD-10-CM | POA: Diagnosis not present

## 2023-08-09 DIAGNOSIS — Z124 Encounter for screening for malignant neoplasm of cervix: Secondary | ICD-10-CM | POA: Diagnosis not present

## 2023-08-09 DIAGNOSIS — Z01411 Encounter for gynecological examination (general) (routine) with abnormal findings: Secondary | ICD-10-CM | POA: Diagnosis not present

## 2023-08-09 DIAGNOSIS — N898 Other specified noninflammatory disorders of vagina: Secondary | ICD-10-CM | POA: Diagnosis not present

## 2023-08-09 DIAGNOSIS — Z1231 Encounter for screening mammogram for malignant neoplasm of breast: Secondary | ICD-10-CM | POA: Diagnosis not present

## 2023-08-09 DIAGNOSIS — Z90711 Acquired absence of uterus with remaining cervical stump: Secondary | ICD-10-CM | POA: Diagnosis not present

## 2023-08-09 DIAGNOSIS — N3941 Urge incontinence: Secondary | ICD-10-CM | POA: Diagnosis not present

## 2023-08-09 DIAGNOSIS — Z01419 Encounter for gynecological examination (general) (routine) without abnormal findings: Secondary | ICD-10-CM | POA: Diagnosis not present

## 2023-08-09 DIAGNOSIS — N760 Acute vaginitis: Secondary | ICD-10-CM | POA: Diagnosis not present

## 2023-08-09 DIAGNOSIS — Z6826 Body mass index (BMI) 26.0-26.9, adult: Secondary | ICD-10-CM | POA: Diagnosis not present

## 2023-08-09 LAB — HM MAMMOGRAPHY

## 2023-08-10 ENCOUNTER — Encounter (HOSPITAL_COMMUNITY)
Admission: RE | Admit: 2023-08-10 | Discharge: 2023-08-10 | Disposition: A | Discharge status: Home / Self Care | Payer: PPO | Source: Ambulatory Visit | Attending: Pulmonary Disease

## 2023-08-10 DIAGNOSIS — R0602 Shortness of breath: Secondary | ICD-10-CM

## 2023-08-10 NOTE — Progress Notes (Signed)
Daily Session Note  Patient Details  Name: Julie [name] MRN: [id number] Date of Birth: 01-Jan-1941 Referring Provider:   Doristine Devoid Pulmonary Rehab Walk Test from 07/16/2023 in [facility] for Heart, Vascular, & Lung Health  Referring Provider Everardo All       Encounter Date: 08/10/2023  Check In:  Session Check In - 08/10/23 1333       Check-In   Supervising physician immediately available to respond to emergencies [facility] MD immediately available    Physician(s) Joni Reining, NP    [facility]    Staff Present Raford Pitcher, MS, ACSM-CEP, Exercise Physiologist;[name], ACSM-CEP, Exercise Physiologist;Sanaiya Welliver Katrinka Blazing, RT    Virtual Visit No    Medication changes reported     No    Fall or balance concerns reported    No    Tobacco Cessation No Change    Warm-up and Cool-down Performed as group-led instruction    Resistance Training Performed Yes    VAD Patient? No    PAD/SET Patient? No      Pain Assessment   Currently in Pain? No/denies    Multiple Pain Sites No             Capillary Blood Glucose: No results found for this or any previous visit (from the past 24 hour(s)).    Social History   Tobacco Use  Smoking Status Never   Passive exposure: Past  Smokeless Tobacco Never    Goals Met:  Proper associated with RPD/PD & O2 Sat Independence with exercise equipment Exercise tolerated well No report of concerns or symptoms today Strength training completed today  Goals Unmet:  Not Applicable  Comments: Service time is from 1310 to 1441.    Dr. Mechele Collin is Medical Director for Pulmonary Rehab at [facility].

## 2023-08-12 ENCOUNTER — Encounter (HOSPITAL_COMMUNITY)
Admission: RE | Admit: 2023-08-12 | Discharge: 2023-08-12 | Disposition: A | Discharge status: Home / Self Care | Payer: PPO | Source: Ambulatory Visit | Attending: Pulmonary Disease | Admitting: Pulmonary Disease

## 2023-08-12 VITALS — Wt 133.2 lb

## 2023-08-12 DIAGNOSIS — R0602 Shortness of breath: Secondary | ICD-10-CM

## 2023-08-12 NOTE — Progress Notes (Addendum)
Daily Session Note  Patient Details  Name: Julie [name] MRN: [id number] Date of Birth: May 03, 1941 Referring Provider:   Doristine Devoid Pulmonary Rehab Walk Test from 07/16/2023 in [facility] for Heart, Vascular, & Lung Health  Referring Provider Everardo All       Encounter Date: 08/12/2023  Check In:  Session Check In - 08/12/23 1400       Check-In   Supervising physician immediately available to respond to emergencies [facility] MD immediately available    Physician(s) Jari Favre, NP    [facility]    Staff Present Raford Pitcher, MS, ACSM-CEP, Exercise Physiologist;[name], ACSM-CEP, Exercise Physiologist;Casey Synthia Innocent, RN, BSN;Samantha [place], RD, LDN    Virtual Visit No    Medication changes reported     No    Fall or balance concerns reported    No    Tobacco Cessation No Change    Warm-up and Cool-down Performed as group-led instruction    Resistance Training Performed Yes    VAD Patient? No    PAD/SET Patient? No      Pain Assessment   Currently in Pain? No/denies    Multiple Pain Sites No             Capillary Blood Glucose: No results found for this or any previous visit (from the past 24 hour(s)).    Social History   Tobacco Use  Smoking Status Never   Passive exposure: Past  Smokeless Tobacco Never    Goals Met:  Proper associated with RPD/PD & O2 Sat Exercise tolerated well No report of concerns or symptoms today Strength training completed today  Goals Unmet:  Not Applicable  Comments: Service time is from 1314 to 1437.   Dr. Mechele Collin is Medical Director for Pulmonary Rehab at [facility].

## 2023-08-17 ENCOUNTER — Encounter (HOSPITAL_COMMUNITY): Payer: Self-pay

## 2023-08-17 ENCOUNTER — Encounter (HOSPITAL_COMMUNITY): Payer: PPO

## 2023-08-19 ENCOUNTER — Encounter (HOSPITAL_COMMUNITY): Payer: PPO

## 2023-08-24 ENCOUNTER — Encounter (HOSPITAL_COMMUNITY)
Admission: RE | Admit: 2023-08-24 | Discharge: 2023-08-24 | Disposition: A | Discharge status: Home / Self Care | Payer: PPO | Source: Ambulatory Visit | Attending: Pulmonary Disease

## 2023-08-24 DIAGNOSIS — R0602 Shortness of breath: Secondary | ICD-10-CM | POA: Diagnosis not present

## 2023-08-24 NOTE — Progress Notes (Signed)
Daily Session Note  Patient Details  Name: Julie [name] MRN: [id number] Date of Birth: [birth year] Referring Provider:   Doristine Devoid Pulmonary Rehab Walk Test from 07/16/2023 in [facility] for Heart, Vascular, & Lung Health  Referring Provider Julie [name]       Encounter Date: 08/24/2023  Check In:  Session Check In - 08/24/23 1334       Check-In   Supervising physician immediately available to respond to emergencies [facility] MD immediately available    Physician(s) Julie Fabian, NP    [facility]    Staff Present Raford Pitcher, MS, ACSM-CEP, Exercise Physiologist;[name], ACSM-CEP, Exercise Physiologist;Casey Synthia Innocent, RN, BSN;Samantha Belarus, RD, LDN    Virtual Visit No    Medication changes reported     No    Fall or balance concerns reported    No    Tobacco Cessation No Change    Warm-up and Cool-down Performed as group-led instruction    Resistance Training Performed Yes    VAD Patient? No    PAD/SET Patient? No      Pain Assessment   Currently in Pain? No/denies    Multiple Pain Sites No             Capillary Blood Glucose: No results found for this or any previous visit (from the past 24 hour(s)).    Social History   Tobacco Use  Smoking Status Never   Passive exposure: Past  Smokeless Tobacco Never    Goals Met:  Proper associated with RPD/PD & O2 Sat Exercise tolerated well No report of concerns or symptoms today Strength training completed today  Goals Unmet:  Not Applicable  Comments: Service time is from 1313 to 1440.  Dr. Mechele [name] is Medical Director for Pulmonary Rehab at [facility].

## 2023-08-26 ENCOUNTER — Encounter (HOSPITAL_COMMUNITY)
Admission: RE | Admit: 2023-08-26 | Discharge: 2023-08-26 | Disposition: A | Discharge status: Home / Self Care | Payer: PPO | Source: Ambulatory Visit | Attending: Pulmonary Disease | Admitting: Pulmonary Disease

## 2023-08-26 DIAGNOSIS — R0602 Shortness of breath: Secondary | ICD-10-CM

## 2023-08-26 NOTE — Progress Notes (Signed)
Home Exercise Prescription I have reviewed a Home Exercise Prescription with Julie [name]. She is not currently exercising at home but wants to start so she can get ready for a trip to [place] in a couple weeks. Discussed starting to walk at [facility] [facility] hardware or other large store using a cane. Encouraged starting with 15 min and building up to 30 min, 1-2 nonrehab days a week. She is agreeable. She attempted to get on the treadmill but could not tolerate it at a slow pace. The patient stated that their goals were to build up her functional stamina for [place] and keep up with family. We reviewed exercise guidelines, target heart rate during exercise, RPE Scale, weather conditions, endpoints for exercise, warmup and cool down. The patient is encouraged to come to me with any questions. I will continue to follow up with the patient to assist them with progression and safety.  Spent 15 min discussing home exercise plan and goals.  Julie [name] [employer] [place], [place], ACSM-CEP 08/26/2023 3:40 PM

## 2023-08-26 NOTE — Progress Notes (Signed)
Daily Session Note  Patient Details  Name: Julie [name] MRN: [id number] Date of Birth: [birth year] Referring Provider:   Doristine Devoid Pulmonary Rehab Walk Test from 07/16/2023 in [facility] for Heart, Vascular, & Lung Health  Referring Provider Everardo All       Encounter Date: 08/26/2023  Check In:  Session Check In - 08/26/23 1515       Check-In   Supervising physician immediately available to respond to emergencies [facility] MD immediately available    Physician(s) Eligha Bridegroom, NP    [facility]    Staff Present Raford Pitcher, MS, ACSM-CEP, Exercise Physiologist;Chanette Demo Dionisio Paschal, ACSM-CEP, Exercise Physiologist;Casey Synthia Innocent, RN, BSN;Samantha Belarus, RD, LDN    Virtual Visit No    Medication changes reported     No    Fall or balance concerns reported    No    Tobacco Cessation No Change    Warm-up and Cool-down Performed as group-led instruction    Resistance Training Performed Yes    VAD Patient? No    PAD/SET Patient? No      Pain Assessment   Currently in Pain? No/denies    Multiple Pain Sites No             Capillary Blood Glucose: No results found for this or any previous visit (from the past 24 hour(s)).   Exercise Prescription Changes - 08/26/23 1500       Home Exercise Plan   Plans to continue exercise at Home (comment)   walking at [employer]   Frequency Add 2 additional days to program exercise sessions.    Initial Home Exercises Provided 08/26/23             Social History   Tobacco Use  Smoking Status Never   Passive exposure: Past  Smokeless Tobacco Never    Goals Met:  Independence with exercise equipment Exercise tolerated well No report of concerns or symptoms today Strength training completed today  Goals Unmet:  Not Applicable  Comments: Service time is from 1326 to 1452.  Dr. Mechele Collin is Medical Director for Pulmonary Rehab at [facility].

## 2023-08-31 ENCOUNTER — Encounter (HOSPITAL_COMMUNITY)
Admission: RE | Admit: 2023-08-31 | Discharge: 2023-08-31 | Disposition: A | Discharge status: Home / Self Care | Payer: PPO | Source: Ambulatory Visit | Attending: Pulmonary Disease | Admitting: Pulmonary Disease

## 2023-08-31 ENCOUNTER — Encounter (HOSPITAL_COMMUNITY): Payer: Self-pay

## 2023-08-31 VITALS — Wt 132.1 lb

## 2023-08-31 DIAGNOSIS — R0602 Shortness of breath: Secondary | ICD-10-CM

## 2023-08-31 NOTE — Progress Notes (Signed)
Daily Session Note  Patient Details  Name: Julie [name] MRN: [id number] Date of Birth: 12-Sep-1941 Referring Provider:   Doristine Devoid Pulmonary Rehab Walk Test from 07/16/2023 in [facility] for Heart, Vascular, & Lung Health  Referring Provider Everardo All       Encounter Date: 08/31/2023  Check In:  Session Check In - 08/31/23 1428       Check-In   Supervising physician immediately available to respond to emergencies [facility] MD immediately available    Physician(s) Robin Searing, NP    [facility]    Staff Present Raford Pitcher, MS, ACSM-CEP, Exercise Physiologist;Casey Synthia Innocent, RN, BSN;Samantha Belarus, RD, LDN;Johnny Hale Bogus, MS, Exercise Physiologist    Virtual Visit No    Medication changes reported     No    Fall or balance concerns reported    No    Tobacco Cessation No Change    Warm-up and Cool-down Performed as group-led instruction    Resistance Training Performed Yes    VAD Patient? No    PAD/SET Patient? No      Pain Assessment   Currently in Pain? No/denies    Multiple Pain Sites No             Capillary Blood Glucose: No results found for this or any previous visit (from the past 24 hour(s)).   Exercise Prescription Changes - 08/31/23 1500       Response to Exercise   Blood Pressure (Admit) 114/76    Blood Pressure (Exercise) 144/72    Blood Pressure (Exit) 120/68    Heart Rate (Admit) 75 bpm    Heart Rate (Exercise) 120 bpm    Heart Rate (Exit) 90 bpm    Oxygen Saturation (Admit) 96 %    Oxygen Saturation (Exercise) 93 %    Oxygen Saturation (Exit) 95 %    Rating of Perceived Exertion (Exercise) 12    Perceived Dyspnea (Exercise) 2    Duration Continue with 30 min of aerobic exercise without signs/symptoms of physical distress.    Intensity THRR unchanged      Progression   Progression Continue to progress workloads to maintain intensity without signs/symptoms of physical distress.       Resistance Training   Training Prescription Yes    Weight blue bands    Reps 10-15    Time 10 Minutes      Recumbant Bike   Level 3    RPM 51    Watts 26    Minutes 15    METs 2.9      Recumbant Elliptical   Level 3    RPM 58    Watts 77    Minutes 15    METs 4.8             Social History   Tobacco Use  Smoking Status Never   Passive exposure: Past  Smokeless Tobacco Never    Goals Met:  Independence with exercise equipment Exercise tolerated well No report of concerns or symptoms today Strength training completed today  Goals Unmet:  Not Applicable  Comments: Service time is from 1313 to 1442    Dr. Mechele Collin is Medical Director for Pulmonary Rehab at [facility].

## 2023-09-01 NOTE — Progress Notes (Signed)
Pulmonary Individual Treatment Plan  Patient Details  Name: Julie [name] MRN: [id number] Date of Birth: September 26, 1941 Referring Provider:   Doristine Devoid Pulmonary Rehab Walk Test from 07/16/2023 in [facility]  Referring Provider Everardo All       Initial Encounter Date:  Flowsheet Row Pulmonary Rehab Walk Test from 07/16/2023 in [facility]  Date 07/16/23       Visit Diagnosis: Shortness of breath  Patient's Home Medications on Admission:   Current Outpatient Medications:    albuterol (VENTOLIN HFA) 108 (90 Base) MCG/ACT inhaler, INHALE 2 PUFFS INTO THE LUNGS EVERY 6 HOURS AS NEEDED FOR WHEEZING OR SHORTNESS OF BREATH, Disp: 6.7 g, Rfl: 0   atorvastatin (LIPITOR) 20 MG tablet, Take 1 tablet (20 mg total) by mouth daily., Disp: 90 tablet, Rfl: 3   gabapentin (NEURONTIN) 100 MG capsule, Take 100 mg by mouth as needed., Disp: , Rfl:    levothyroxine (SYNTHROID) 50 MCG tablet, Take 1 tablet (50 mcg total) by mouth daily., Disp: 90 tablet, Rfl: 0   losartan (COZAAR) 50 MG tablet, Take 1 tablet (50 mg total) by mouth daily., Disp: 90 tablet, Rfl: 3   VITAMIN E PO, Take by mouth., Disp: , Rfl:   Past Medical History: 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   Other bursitis disorders    history   Personal history of colonic polyps    PONV (postoperative nausea and vomiting)    Pre-diabetes     Pure hypercholesterolemia    Spondylolisthesis, cervical region    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    skin -spots on face, forehead and leg removed   SVD (spontaneous vaginal delivery)    x 1   Unspecified disorder of thyroid    Unspecified hypothyroidism     Tobacco Use: Social History   Tobacco Use  Smoking Status Never   Passive exposure: Past  Smokeless Tobacco Never    Labs: Review Flowsheet  More data exists      Latest Ref Rng & Units 01/16/2019 12/15/2019 09/02/2020 09/22/2021 02/09/2023  Labs for ITP Cardiac and Pulmonary Rehab  Cholestrol 100 - 199 mg/dL - - 629  - 528   LDL (calc) 0 - 99 mg/dL - - 413  - 244   HDL-C >39 mg/dL - - 49  - 51   Trlycerides 0 - 149 mg/dL - - 76  - 010   Hemoglobin A1c 4.0 - 5.6 % 5.6  6.0  - 5.6  -    Details            Capillary Blood Glucose: Lab Results  Component Value Date   GLUCAP 99 01/23/2017   GLUCAP 97 01/22/2017   GLUCAP 166 (H) 12/30/2012     Pulmonary Assessment Scores:  Pulmonary Assessment Scores  Row Name 07/16/23 1341         ADL UCSD   ADL Phase Entry     SOB Score total 60       CAT Score   CAT Score 19       mMRC Score   mMRC Score 4             UCSD: Self-administered rating of dyspnea associated with activities of daily living (ADLs) 6-point scale (0 = "not at all" to 5 = "maximal or unable to do because of breathlessness")  Scoring Scores range from 0 to 120.  Minimally important difference is 5 units  CAT: CAT can identify the health impairment of COPD patients and is better correlated with disease progression.  CAT has a scoring range of zero to 40. The CAT score is classified into four groups of low (less than 10), medium (10 - 20), high (21-30) and very high (31-40) based on the impact level of disease on health status. A CAT score over 10 suggests significant symptoms.  A worsening CAT score could be explained by an exacerbation, poor medication adherence, poor  inhaler technique, or progression of COPD or comorbid conditions.  CAT MCID is 2 points  mMRC: mMRC (Modified Medical Research Council) Dyspnea Scale is used to assess the degree of baseline functional disability in patients of respiratory disease due to dyspnea. No minimal important difference is established. A decrease in score of 1 point or greater is considered a positive change.   Pulmonary Function Assessment:  Pulmonary Function Assessment - 07/16/23 1311       Breath   Bilateral Breath Sounds Clear    Shortness of Breath Yes;Limiting activity             Exercise Target Goals: Exercise Program Goal: Individual exercise prescription set using results from initial 6 min walk test and THRR while considering  patient's activity barriers and safety.   Exercise Prescription Goal: Initial exercise prescription builds to 30-45 minutes a day of aerobic activity, 2-3 days per week.  Home exercise guidelines will be given to patient during program as part of exercise prescription that the participant will acknowledge.  Activity Barriers & Risk Stratification:  Activity Barriers & Cardiac Risk Stratification - 07/16/23 1310       Activity Barriers & Cardiac Risk Stratification   Activity Barriers History of Falls;Deconditioning;Muscular Weakness;Shortness of Breath;Balance Concerns;Back Problems             6 Minute Walk:  6 Minute Walk     Row Name 07/16/23 1400         6 Minute Walk   Phase Initial     Distance 823 feet     Walk Time 6 minutes     # of Rest Breaks 0     MPH 1.56     METS 1.2     RPE 11     Perceived Dyspnea  1     VO2 Peak 4.19     Resting HR 81 bpm     Resting BP 130/72     Resting Oxygen Saturation  94 %     Exercise Oxygen Saturation  during 6 min walk 92 %     Max Ex. HR 105 bpm     Max Ex. BP 132/82     2 Minute Post BP 128/80       Interval HR   1 Minute HR 95     2 Minute HR 107  3 Minute HR 103     4 Minute HR 105     5  Minute HR 101     6 Minute HR 100     2 Minute Post HR 86     Interval Heart Rate? Yes       Interval Oxygen   Interval Oxygen? Yes     Baseline Oxygen Saturation % 94 %     1 Minute Oxygen Saturation % 93 %     1 Minute Liters of Oxygen 0 L     2 Minute Oxygen Saturation % 94 %     2 Minute Liters of Oxygen 0 L     3 Minute Oxygen Saturation % 97 %     3 Minute Liters of Oxygen 0 L     4 Minute Oxygen Saturation % 92 %     4 Minute Liters of Oxygen 0 L     5 Minute Oxygen Saturation % 94 %     5 Minute Liters of Oxygen 0 L     6 Minute Oxygen Saturation % 95 %     6 Minute Liters of Oxygen 0 L     2 Minute Post Oxygen Saturation % 97 %     2 Minute Post Liters of Oxygen 0 L              Oxygen Initial Assessment:  Oxygen Initial Assessment - 07/16/23 1310       Home Oxygen   Home Oxygen Device None    Sleep Oxygen Prescription None    Home Exercise Oxygen Prescription None    Home Resting Oxygen Prescription None      Initial 6 min Walk   Oxygen Used None      Program Oxygen Prescription   Program Oxygen Prescription None      Intervention   Short Term Goals To learn and understand importance of maintaining oxygen saturations>88%;To learn and demonstrate proper use of respiratory medications;To learn and understand importance of monitoring SPO2 with pulse oximeter and demonstrate accurate use of the pulse oximeter.;To learn and demonstrate proper pursed lip breathing techniques or other breathing techniques.     Long  Term Goals Maintenance of O2 saturations>88%;Compliance with respiratory medication;Demonstrates proper use of MDI's;Exhibits proper breathing techniques, such as pursed lip breathing or other method taught during program session;Verbalizes importance of monitoring SPO2 with pulse oximeter and return demonstration             Oxygen Re-Evaluation:  Oxygen Re-Evaluation     Row Name 07/27/23 0934 08/27/23 1130           Program Oxygen  Prescription   Program Oxygen Prescription None None        Home Oxygen   Home Oxygen Device None None      Sleep Oxygen Prescription None None      Home Exercise Oxygen Prescription None None      Home Resting Oxygen Prescription None None        Goals/Expected Outcomes   Short Term Goals To learn and understand importance of maintaining oxygen saturations>88%;To learn and demonstrate proper use of respiratory medications;To learn and understand importance of monitoring SPO2 with pulse oximeter and demonstrate accurate use of the pulse oximeter.;To learn and demonstrate proper pursed lip breathing techniques or other breathing techniques.  To learn and understand importance of maintaining oxygen saturations>88%;To learn and demonstrate proper use of respiratory medications;To learn and understand importance of monitoring SPO2 with pulse oximeter  and demonstrate accurate use of the pulse oximeter.;To learn and demonstrate proper pursed lip breathing techniques or other breathing techniques.       Long  Term Goals Maintenance of O2 saturations>88%;Compliance with respiratory medication;Demonstrates proper use of MDI's;Exhibits proper breathing techniques, such as pursed lip breathing or other method taught during program session;Verbalizes importance of monitoring SPO2 with pulse oximeter and return demonstration Maintenance of O2 saturations>88%;Compliance with respiratory medication;Demonstrates proper use of MDI's;Exhibits proper breathing techniques, such as pursed lip breathing or other method taught during program session;Verbalizes importance of monitoring SPO2 with pulse oximeter and return demonstration      Goals/Expected Outcomes Compliance and understanding of oxygen saturation monitoring and breathing techniques to decrease shortness of breath Compliance and understanding of oxygen saturation monitoring and breathing techniques to decrease shortness of breath               Oxygen  Discharge (Final Oxygen Re-Evaluation):  Oxygen Re-Evaluation - 08/27/23 1130       Program Oxygen Prescription   Program Oxygen Prescription None      Home Oxygen   Home Oxygen Device None    Sleep Oxygen Prescription None    Home Exercise Oxygen Prescription None    Home Resting Oxygen Prescription None      Goals/Expected Outcomes   Short Term Goals To learn and understand importance of maintaining oxygen saturations>88%;To learn and demonstrate proper use of respiratory medications;To learn and understand importance of monitoring SPO2 with pulse oximeter and demonstrate accurate use of the pulse oximeter.;To learn and demonstrate proper pursed lip breathing techniques or other breathing techniques.     Long  Term Goals Maintenance of O2 saturations>88%;Compliance with respiratory medication;Demonstrates proper use of MDI's;Exhibits proper breathing techniques, such as pursed lip breathing or other method taught during program session;Verbalizes importance of monitoring SPO2 with pulse oximeter and return demonstration    Goals/Expected Outcomes Compliance and understanding of oxygen saturation monitoring and breathing techniques to decrease shortness of breath             Initial Exercise Prescription:  Initial Exercise Prescription - 07/16/23 1400       Date of Initial Exercise RX and Referring Provider   Date 07/16/23    Referring Provider Everardo All    Expected Discharge Date 10/14/23      Recumbant Bike   Level 1    Minutes 15    METs 1.5      Recumbant Elliptical   Level 1    Minutes 15    METs 2      Prescription Details   Frequency (times per week) 2    Duration Progress to 30 minutes of continuous aerobic without signs/symptoms of physical distress      Intensity   THRR 40-80% of Max Heartrate 55-110    Ratings of Perceived Exertion 11-13    Perceived Dyspnea 0-4      Progression   Progression Continue to progress workloads to maintain intensity without  signs/symptoms of physical distress.      Resistance Training   Training Prescription Yes    Weight red bands    Reps 10-15             Perform Capillary Blood Glucose checks as needed.  Exercise Prescription Changes:   Exercise Prescription Changes     Row Name 08/03/23 1500 08/17/23 1236 08/26/23 1500 08/31/23 1500       Response to Exercise   Blood Pressure (Admit) 122/70 122/74 -- 114/76  Blood Pressure (Exercise) 158/76 -- -- 144/72    Blood Pressure (Exit) 102/62 116/72 -- 120/68    Heart Rate (Admit) 85 bpm 98 bpm -- 75 bpm    Heart Rate (Exercise) 103 bpm 119 bpm -- 120 bpm    Heart Rate (Exit) 84 bpm 102 bpm -- 90 bpm    Oxygen Saturation (Admit) 93 % 95 % -- 96 %    Oxygen Saturation (Exercise) 94 % 92 % -- 93 %    Oxygen Saturation (Exit) 93 % 96 % -- 95 %    Rating of Perceived Exertion (Exercise) 13 11 -- 12    Perceived Dyspnea (Exercise) 3 2 -- 2    Duration Continue with 30 min of aerobic exercise without signs/symptoms of physical distress. Continue with 30 min of aerobic exercise without signs/symptoms of physical distress. -- Continue with 30 min of aerobic exercise without signs/symptoms of physical distress.    Intensity THRR unchanged THRR unchanged -- THRR unchanged      Progression   Progression Continue to progress workloads to maintain intensity without signs/symptoms of physical distress. Continue to progress workloads to maintain intensity without signs/symptoms of physical distress. -- Continue to progress workloads to maintain intensity without signs/symptoms of physical distress.    Average METs 3.7 -- -- --      Paramedic Prescription Yes Yes -- Yes    Weight blue bands blue bands -- blue bands    Reps 10-15 10-15 -- 10-15    Time 10 Minutes 10 Minutes -- 10 Minutes      Recumbant Bike   Level 2 2 -- 3    RPM 60 52 -- 51    Watts -- -- -- 26    Minutes 15 15 -- 15    METs 2.6 2.2 -- 2.9      Recumbant  Elliptical   Level 2 3 -- 3    RPM -- -- -- 58    Watts -- -- -- 77    Minutes 15 15 -- 15    METs 3.7 3.8 -- 4.8      Home Exercise Plan   Plans to continue exercise at -- -- Home (comment)  walking at [employer] --    Frequency -- -- Add 2 additional days to program exercise sessions. --    Initial Home Exercises Provided -- -- 08/26/23 --             Exercise Comments:   Exercise Comments     Row Name 07/22/23 1509 08/26/23 1534         Exercise Comments Pt completed first day of group exercise. She exercised on recumbent bike for 15 min, level 1, METs 1.9. She then exercised on the recumbent elliptical, level 1, METs 3.1 for 15 min. Pt tolerated well. Able to perform warm up and cool down exercises with chair support. Discussed METs with good restrictions. Discussed with pt home exercise plan. She is not currently exercising at home but wants to start so she can get ready for a trip to [place] in a couple weeks. Discussed starting to walk at [facility] hardware or other large store using a cane. Encouraged starting with 15 min and building up to 30 min, 1-2 nonrehab days a week. She is agreeable.               Exercise Goals and Review:   Exercise Goals     Row Name 07/16/23 1310  Exercise Goals   Increase Physical Activity Yes       Intervention Provide advice, education, support and counseling about physical activity/exercise needs.;Develop an individualized exercise prescription for aerobic and resistive training based on initial evaluation findings, risk stratification, comorbidities and participant's personal goals.       Expected Outcomes Short Term: Attend rehab on a regular basis to increase amount of physical activity.;Long Term: Exercising regularly at least 3-5 days a week.;Long Term: Add in home exercise to make exercise part of routine and to increase amount of physical activity.       Increase Strength and Stamina Yes       Intervention Provide  advice, education, support and counseling about physical activity/exercise needs.;Develop an individualized exercise prescription for aerobic and resistive training based on initial evaluation findings, risk stratification, comorbidities and participant's personal goals.       Expected Outcomes Short Term: Increase workloads from initial exercise prescription for resistance, speed, and METs.;Short Term: Perform resistance training exercises routinely during rehab and add in resistance training at home;Long Term: Improve cardiorespiratory fitness, muscular endurance and strength as measured by increased METs and functional capacity ( )       Able to understand and use rate of perceived exertion (RPE) scale Yes       Intervention Provide education and explanation on how to use RPE scale       Expected Outcomes Short Term: Able to use RPE daily in rehab to express subjective intensity level;Long Term:  Able to use RPE to guide intensity level when exercising independently       Able to understand and use Dyspnea scale Yes       Intervention Provide education and explanation on how to use Dyspnea scale       Expected Outcomes Short Term: Able to use Dyspnea scale daily in rehab to express subjective sense of shortness of breath during exertion;Long Term: Able to use Dyspnea scale to guide intensity level when exercising independently       Knowledge and understanding of Target Heart Rate Range (THRR) Yes       Intervention Provide education and explanation of THRR including how the numbers were predicted and where they are located for reference       Expected Outcomes Short Term: Able to state/look up THRR;Short Term: Able to use daily as guideline for intensity in rehab;Long Term: Able to use THRR to govern intensity when exercising independently       Understanding of Exercise Prescription Yes       Intervention Provide education, explanation, and written materials on patient's individual exercise  prescription       Expected Outcomes Short Term: Able to explain program exercise prescription;Long Term: Able to explain home exercise prescription to exercise independently                Exercise Goals Re-Evaluation :  Exercise Goals Re-Evaluation     Row Name 07/27/23 0932 08/27/23 1125           Exercise Goal Re-Evaluation   Exercise Goals Review Increase Physical Activity;Able to understand and use Dyspnea scale;Understanding of Exercise Prescription;Increase Strength and Stamina;Knowledge and understanding of Target Heart Rate Range (THRR);Able to understand and use rate of perceived exertion (RPE) scale Increase Physical Activity;Able to understand and use Dyspnea scale;Understanding of Exercise Prescription;Increase Strength and Stamina;Knowledge and understanding of Target Heart Rate Range (THRR);Able to understand and use rate of perceived exertion (RPE) scale      Comments Pt has  completed one exercise session. She exercised on recumbent bike for 15 min, level 1, METs 1.9. She then exercised on the recumbent elliptical, level 1, METs 3.1 for 15 min. Pt tolerated well. Able to perform warm up and cool down exercises with chair support. Will progress pt as tolerated. Pt has completed 8 exercise session, missing 1. She is exercising on recumbent bike for 15 min, level 3, METs 2.7. She then is exercising on the recumbent elliptical, level 3, METs 3.3 for 15 min. She has progressed well. She is going to [place] in a few weeks and wants to increase her walking ability therefore attempted to walk on the treadmill the last session. However she felt the treadmill too fast at 1.0 mph and did not feel safe. Therefore discussed home exercise and walking at home to increase her ability in preparation for her trip. She is no longer requiring chair support for cool down exercises but is still on red bands. Will continue to progress.      Expected Outcomes Through exercise at rehab and home, the  patient will decrease shortness of breath with daily activities and feel confident in carrying out an exercise regimen at home Through exercise at rehab and home, the patient will decrease shortness of breath with daily activities and feel confident in carrying out an exercise regimen at home               Discharge Exercise Prescription (Final Exercise Prescription Changes):  Exercise Prescription Changes - 08/31/23 1500       Response to Exercise   Blood Pressure (Admit) 114/76    Blood Pressure (Exercise) 144/72    Blood Pressure (Exit) 120/68    Heart Rate (Admit) 75 bpm    Heart Rate (Exercise) 120 bpm    Heart Rate (Exit) 90 bpm    Oxygen Saturation (Admit) 96 %    Oxygen Saturation (Exercise) 93 %    Oxygen Saturation (Exit) 95 %    Rating of Perceived Exertion (Exercise) 12    Perceived Dyspnea (Exercise) 2    Duration Continue with 30 min of aerobic exercise without signs/symptoms of physical distress.    Intensity THRR unchanged      Progression   Progression Continue to progress workloads to maintain intensity without signs/symptoms of physical distress.      Resistance Training   Training Prescription Yes    Weight blue bands    Reps 10-15    Time 10 Minutes      Recumbant Bike   Level 3    RPM 51    Watts 26    Minutes 15    METs 2.9      Recumbant Elliptical   Level 3    RPM 58    Watts 77    Minutes 15    METs 4.8             Nutrition:  Target Goals: Understanding of nutrition guidelines, daily intake of sodium 1500mg , cholesterol 200mg , calories 30% from fat and 7% or less from saturated fats, daily to have 5 or more servings of fruits and vegetables.  Biometrics:  Pre Biometrics - 07/16/23 1305       Pre Biometrics   Grip Strength 10 kg              Nutrition Therapy Plan and Nutrition Goals:  Nutrition Therapy & Goals - 08/26/23 1454       Nutrition Therapy   Diet Heart Healthy Diet  Drug/Food Interactions  Statins/Certain Fruits      Personal Nutrition Goals   Nutrition Goal Patient to improve diet quality by using the plate method as a guide for meal planning to include lean protein/plant protein, fruits, vegetables, whole grains, nonfat dairy as part of a well-balanced diet.   goal in progress.   Personal Goal #2 Patient to identify strategies for weight loss of 0.5-2.0# per week.   goal in progress.   Comments Goal in progress. Shiane reports motivation to lose weight with goal weight of 100#.  She reports craving sweets/carbohydrates and over-snacking at night. She is down 4# since starting with our program. Her lipid panel remains elevated. Patient will benefit from participation in pulmonary rehab for nutrition, exercise, and lifestyle modification.      Intervention Plan   Intervention Prescribe, educate and counsel regarding individualized specific dietary modifications aiming towards targeted core components such as weight, hypertension, lipid management, diabetes, heart failure and other comorbidities.;Nutrition handout(s) given to patient.    Expected Outcomes Short Term Goal: Understand basic principles of dietary content, such as calories, fat, sodium, cholesterol and nutrients.;Long Term Goal: Adherence to prescribed nutrition plan.             Nutrition Assessments:  Nutrition Assessments - 07/29/23 0949       Rate Your Plate Scores   Pre Score 54            MEDIFICTS Score Key: >=70 Need to make dietary changes  40-70 Heart Healthy Diet <= 40 Therapeutic Level Cholesterol Diet  Flowsheet Row PULMONARY REHAB OTHER RESPIRATORY from 07/22/2023 in [facility]  Picture Your Plate Total Score on Admission 54      Picture Your Plate Scores: <16 Unhealthy dietary pattern with much room for improvement. 41-50 Dietary pattern unlikely to meet recommendations for good health and room for improvement. 51-60 More  healthful dietary pattern, with some room for improvement.  >60 Healthy dietary pattern, although there may be some specific behaviors that could be improved.    Nutrition Goals Re-Evaluation:  Nutrition Goals Re-Evaluation     Row Name 07/30/23 1434 08/26/23 1454           Goals   Current Weight 134 lb 14.7 oz (61.2 kg) 130 lb 15.3 oz (59.4 kg)      Comment LDL 168, cholesterol 241 no new labs; most recent labs  LDL 168, cholesterol 241 (lipitor)      Expected Outcome Chelise reports motivation to lose weight with goal weight of 100#. She reports craving sweets/carbohydrates and over-snacking at night. Her lipid panel remains elevated. Patient will benefit from participation in pulmonary rehab for nutrition, exercise, and lifestyle modification. Goal in progress. Chalina reports motivation to lose weight with goal weight of 100#. She reports craving sweets/carbohydrates and over-snacking at night. She is down 4# since starting with our program. Her lipid panel remains elevated. Patient will benefit from participation in pulmonary rehab for nutrition, exercise, and lifestyle modification.               Nutrition Goals Discharge (Final Nutrition Goals Re-Evaluation):  Nutrition Goals Re-Evaluation - 08/26/23 1454       Goals   Current Weight 130 lb 15.3 oz (59.4 kg)    Comment no new labs; most recent labs  LDL 168, cholesterol 241 (lipitor)    Expected Outcome Goal in progress. Dajai reports motivation to lose weight with goal weight of 100#. She reports  craving sweets/carbohydrates and over-snacking at night. She is down 4# since starting with our program. Her lipid panel remains elevated. Patient will benefit from participation in pulmonary rehab for nutrition, exercise, and lifestyle modification.             Psychosocial: Target Goals: Acknowledge presence or absence of significant depression and/or stress, maximize coping skills, provide positive support system. Participant is  able to verbalize types and ability to use techniques and skills needed for reducing stress and depression.  Initial Review & Psychosocial Screening:  Initial Psych Review & Screening - 07/16/23 1312       Initial Review   Current issues with Current Sleep Concerns;Current Stress Concerns    Source of Stress Concerns Unable to participate in former interests or hobbies      Family Dynamics   Good Support System? Yes    Comments Supportive husband      Barriers   Psychosocial barriers to participate in program The patient should benefit from training in stress management and relaxation.      Screening Interventions   Interventions Encouraged to exercise;Provide feedback about the scores to participant    Expected Outcomes Short Term goal: Utilizing psychosocial counselor, staff and physician to assist with identification of specific Stressors or current issues interfering with healing process. Setting desired goal for each stressor or current issue identified.;Long Term Goal: Stressors or current issues are controlled or eliminated.;Short Term goal: Identification and review with participant of any Quality of Life or Depression concerns found by scoring the questionnaire.;Long Term goal: The participant improves quality of Life and PHQ9 Scores as seen by post scores and/or verbalization of changes             Quality of Life Scores:  Scores of 19 and below usually indicate a poorer quality of life in these areas.  A difference of  2-3 points is a clinically meaningful difference.  A difference of 2-3 points in the total score of the Quality of Life Index has been associated with significant improvement in overall quality of life, self-image, physical symptoms, and general health in studies assessing change in quality of life.  PHQ-9: Review Flowsheet  More data exists      07/16/2023 12/29/2022 12/07/2022 08/28/2022 09/30/2021  Depression screen PHQ 2/9  Decreased Interest 0 0 0 0 0   Down, Depressed, Hopeless 0 0 0 0 0  PHQ - 2 Score 0 0 0 0 0  Altered sleeping 2 3 2  - -  Tired, decreased energy 1 2 2  - -  Change in appetite 1 0 1 - -  Feeling bad or failure about yourself  0 0 0 - -  Trouble concentrating 0 0 0 - -  Moving slowly or fidgety/restless 0 0 0 - -  Suicidal thoughts 0 0 0 - -  PHQ-9 Score 4 5 5  - -  Difficult doing work/chores Somewhat difficult Somewhat difficult Not difficult at all - -    Details           Interpretation of Total Score  Total Score Depression Severity:  1-4 = Minimal depression, 5-9 = Mild depression, 10-14 = Moderate depression, 15-19 = Moderately severe depression, 20-27 = Severe depression   Psychosocial Evaluation and Intervention:  Psychosocial Evaluation - 07/16/23 1316       Psychosocial Evaluation & Interventions   Interventions Encouraged to exercise with the program and follow exercise prescription    Comments Alicia is not sleeping well. She has tried Rx  meds in the past but couldn't tolerate. She is hoping exercise will help with sleep. Leahana is also discouraged that she doesn't have enough energy to walk far distances. 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    Expected Outcomes For Cecylia to particiapte in [place] rehab with decreased stress related to her lung disease and more quality restful sleep.    Continue Psychosocial Services  No Follow up required             Psychosocial Re-Evaluation:  Psychosocial Re-Evaluation     Row Name 07/28/23 1128 08/27/23 1320           Psychosocial Re-Evaluation   Current issues with Current Sleep Concerns;Current Stress Concerns Current Sleep Concerns;Current Stress Concerns      Comments No changes since orientation. Jacklynn has completed 1 session. Earnie still endorses concern about her sleep habits. We have encouraged Harriett Sine to implement a sleep routine and stick to it, put up electronics  at least an hour before bed, and go to bed around the same time each night. She stated that she does get more restful sleep since starting PR. She is both excited and concerned about her vacation coming up, in [place]. She states there will be a lot of walking and she's not sure if she is going to be able to keep up with her family. We have started getting Farina ready to go on the trip with increased walking during rehab. She is also walking at home. We have encouraged Mckendra that while she's on vacation to take breaks and listen to her body. She declines any psychosocial needs at this time.      Expected Outcomes For Jaicee to continue to attend pulmonary rehab and to have a positive outlook and good coping skills to manage her stress. To get better quality of sleep. For Della to continue to attend pulmonary rehab and to have a positive outlook and good coping skills to manage her stress. To get better quality of sleep.      Interventions Encouraged to attend Pulmonary Rehabilitation for the exercise;Stress management education Encouraged to attend Pulmonary Rehabilitation for the exercise;Stress management education      Continue Psychosocial Services  No Follow up required No Follow up required               Psychosocial Discharge (Final Psychosocial Re-Evaluation):  Psychosocial Re-Evaluation - 08/27/23 1320       Psychosocial Re-Evaluation   Current issues with Current Sleep Concerns;Current Stress Concerns    Comments Vendetta still endorses concern about her sleep habits. We have encouraged Harriett Sine to implement a sleep routine and stick to it, put up electronics at least an hour before bed, and go to bed around the same time each night. She stated that she does get more restful sleep since starting PR. She is both excited and concerned about her vacation coming up, in [place]. She states there will be a lot of walking and she's not sure if she is going to be able to keep up with her family. We have  started getting Bailie ready to go on the trip with increased walking during rehab. She is also walking at home. We have encouraged Skylynn that while she's on vacation to take breaks and listen to her body. She declines any psychosocial needs at this time.    Expected Outcomes For Nayelis to continue to attend pulmonary rehab and  to have a positive outlook and good coping skills to manage her stress. To get better quality of sleep.    Interventions Encouraged to attend Pulmonary Rehabilitation for the exercise;Stress management education    Continue Psychosocial Services  No Follow up required             Education: Education Goals: Education classes will be provided on a weekly basis, covering required topics. Participant will state understanding/return demonstration of topics presented.  Learning Barriers/Preferences:  Learning Barriers/Preferences - 07/16/23 1356       Learning Barriers/Preferences   Learning Barriers None    Learning Preferences Verbal Instruction;Written Material;Video;Individual Instruction             Education Topics: Know Your Numbers Group instruction that is supported by a PowerPoint presentation. Instructor discusses importance of knowing and understanding resting, exercise, and post-exercise oxygen saturation, heart rate, and blood pressure. Oxygen saturation, heart rate, blood pressure, rating of perceived exertion, and dyspnea are reviewed along with a normal range for these values.  Flowsheet Row PULMONARY REHAB OTHER RESPIRATORY from 08/05/2023 in [facility] for Heart, Vascular, & Lung Health  Date 08/05/23  Educator EP  Instruction Review Code 1- Verbalizes Understanding       Exercise for the Pulmonary Patient Group instruction that is supported by a PowerPoint presentation. Instructor discusses benefits of exercise, core components of exercise, frequency, duration, and intensity of an exercise routine, importance of  utilizing pulse oximetry during exercise, safety while exercising, and options of places to exercise outside of rehab.  Flowsheet Row PULMONARY REHAB OTHER RESPIRATORY from 07/29/2023 in [facility] for Heart, Vascular, & Lung Health  Date 07/29/23  Educator EP  Instruction Review Code 1- Verbalizes Understanding       MET Level  Group instruction provided by PowerPoint, verbal discussion, and written material to support subject matter. Instructor reviews what METs are and how to increase METs.    Pulmonary Medications Verbally interactive group education provided by instructor with focus on inhaled medications and proper administration. Flowsheet Row PULMONARY REHAB OTHER RESPIRATORY from 07/22/2023 in [facility] for Heart, Vascular, & Lung Health  Date 07/22/23  Educator RT  Instruction Review Code 1- Verbalizes Understanding       Anatomy and Physiology of the Respiratory System Group instruction provided by PowerPoint, verbal discussion, and written material to support subject matter. Instructor reviews respiratory cycle and anatomical components of the respiratory system and their functions. Instructor also reviews differences in obstructive and restrictive respiratory diseases with examples of each.    Oxygen Safety Group instruction provided by PowerPoint, verbal discussion, and written material to support subject matter. There is an overview of "What is Oxygen" and "Why do we need it".  Instructor also reviews how to create a safe environment for oxygen use, the importance of using oxygen as prescribed, and the risks of noncompliance. There is a brief discussion on traveling with oxygen and resources the patient may utilize. Flowsheet Row PULMONARY REHAB OTHER RESPIRATORY from 08/12/2023 in [facility] for Heart, Vascular, & Lung Health  Date 08/12/23  Educator RN  Instruction Review Code 1- Verbalizes  Understanding       Oxygen Use Group instruction provided by PowerPoint, verbal discussion, and written material to discuss how supplemental oxygen is prescribed and different types of oxygen supply systems. Resources for more information are provided.    Breathing Techniques Group instruction that is supported by demonstration and  informational handouts. Instructor discusses the benefits of pursed lip and diaphragmatic breathing and detailed demonstration on how to perform both.  Flowsheet Row PULMONARY REHAB OTHER RESPIRATORY from 08/26/2023 in [facility] for Heart, Vascular, & Lung Health  Date 08/26/23  Educator RN  Instruction Review Code 1- Verbalizes Understanding        Risk Factor Reduction Group instruction that is supported by a PowerPoint presentation. Instructor discusses the definition of a risk factor, different risk factors for pulmonary disease, and how the heart and lungs work together.   Pulmonary Diseases Group instruction provided by PowerPoint, verbal discussion, and written material to support subject matter. Instructor gives an overview of the different type of pulmonary diseases. There is also a discussion on risk factors and symptoms as well as ways to manage the diseases.   Stress and Energy Conservation Group instruction provided by PowerPoint, verbal discussion, and written material to support subject matter. Instructor gives an overview of stress and the impact it can have on the body. Instructor also reviews ways to reduce stress. There is also a discussion on energy conservation and ways to conserve energy throughout the day.   Warning Signs and Symptoms Group instruction provided by PowerPoint, verbal discussion, and written material to support subject matter. Instructor reviews warning signs and symptoms of stroke, heart attack, cold and flu. Instructor also reviews ways to prevent the spread of infection.   Other  Education Group or individual verbal, written, or video instructions that support the educational goals of the pulmonary rehab program.    Knowledge Questionnaire Score:  Knowledge Questionnaire Score - 07/16/23 1343       Knowledge Questionnaire Score   Pre Score 14/18             Core Components/Risk Factors/Patient Goals at Admission:  Personal Goals and Risk Factors at Admission - 07/16/23 1317       Core Components/Risk Factors/Patient Goals on Admission    Weight Management Yes;Weight Loss    Intervention Weight Management: Develop a combined nutrition and exercise program designed to reach desired caloric intake, while maintaining appropriate intake of nutrient and fiber, sodium and fats, and appropriate energy expenditure required for the weight goal.;Weight Management: Provide education and appropriate resources to help participant work on and attain dietary goals.;Weight Management/Obesity: Establish reasonable short term and long term weight goals.;Obesity: Provide education and appropriate resources to help participant work on and attain dietary goals.    Expected Outcomes Short Term: Continue to assess and modify interventions until short term weight is achieved;Long Term: Adherence to nutrition and physical activity/exercise program aimed toward attainment of established weight goal;Weight Loss: Understanding of general recommendations for a balanced deficit meal plan, which promotes 1-2 lb weight loss per week and includes a negative energy balance of [phone] kcal/d;Understanding of distribution of calorie intake throughout the day with the consumption of 4-5 meals/snacks;Understanding recommendations for meals to include 15-35% energy as protein, 25-35% energy from fat, 35-60% energy from carbohydrates, less than 200mg  of dietary cholesterol, 20-35 gm of total fiber daily    Improve shortness of breath with ADL's Yes    Intervention Provide education, individualized  exercise plan and daily activity instruction to help decrease symptoms of SOB with activities of daily living.    Expected Outcomes Short Term: Improve cardiorespiratory fitness to achieve a reduction of symptoms when performing ADLs;Long Term: Be able to perform more ADLs without symptoms or delay the onset of symptoms  Core Components/Risk Factors/Patient Goals Review:   Goals and Risk Factor Review     Row Name 07/28/23 1130 08/27/23 1331           Core Components/Risk Factors/Patient Goals Review   Personal Goals Review Weight Management/Obesity;Improve shortness of breath with ADL's;Develop more efficient breathing techniques such as purse lipped breathing and diaphragmatic breathing and practicing self-pacing with activity. Weight Management/Obesity;Improve shortness of breath with ADL's;Develop more efficient breathing techniques such as purse lipped breathing and diaphragmatic breathing and practicing self-pacing with activity.      Review Unable to assess. Shadasia has completed 1 session. Goal in action for weight loss. Clorisa has lost ~4# since starting the program. She is working with our dietician on a plan of action and is open to the ideas and suggestions. Goal progressing on developing more efficient breathing techniques such as purse lipped breathing and diaphragmatic breathing; and practicing self-pacing with activity. We are teaching Harriett Sine how and when to initiate PLB and listening to her body to self-pace herself while exercising. We have been working on this for her upcoming trip to [place]. Goal in progress on improving her shortness of breath with ADLs. She has increased both her workload and METs while maintaining her oxygen saturation >88% on room air. We will continue to monitor and assess Abigayl's progress in the program. Dennie Bible will continue to benefit from PR for nutrition, education, exercise, and lifestyle modification.      Expected Outcomes For Evita to lose  weight, improve her shortness of breath with ADLs, develop more efficient breathing techniques, and learn how to self-pace. For Harriett Sine to lose weight, improve her shortness of breath with ADLs, develop more efficient breathing techniques, and learn how to self-pace.               Core Components/Risk Factors/Patient Goals at Discharge (Final Review):   Goals and Risk Factor Review - 08/27/23 1331       Core Components/Risk Factors/Patient Goals Review   Personal Goals Review Weight Management/Obesity;Improve shortness of breath with ADL's;Develop more efficient breathing techniques such as purse lipped breathing and diaphragmatic breathing and practicing self-pacing with activity.    Review Goal in action for weight loss. Grady has lost ~4# since starting the program. She is working with our dietician on a plan of action and is open to the ideas and suggestions. Goal progressing on developing more efficient breathing techniques such as purse lipped breathing and diaphragmatic breathing; and practicing self-pacing with activity. We are teaching Harriett Sine how and when to initiate PLB and listening to her body to self-pace herself while exercising. We have been working on this for her upcoming trip to [place]. Goal in progress on improving her shortness of breath with ADLs. She has increased both her workload and METs while maintaining her oxygen saturation >88% on room air. We will continue to monitor and assess Sada's progress in the program. Dennie Bible will continue to benefit from PR for nutrition, education, exercise, and lifestyle modification.    Expected Outcomes For Bhavika to lose weight, improve her shortness of breath with ADLs, develop more efficient breathing techniques, and learn how to self-pace.             ITP Comments:   Comments: Pt is making expected progress toward Pulmonary Rehab goals after completing 9 session(s). Recommend continued exercise, life style modification, education, and  utilization of breathing techniques to increase stamina and strength, while also decreasing shortness of breath with exertion.  Dr. Mechele Collin is  Medical Director for Pulmonary Rehab at [facility].

## 2023-09-02 ENCOUNTER — Encounter (HOSPITAL_COMMUNITY)
Admission: RE | Admit: 2023-09-02 | Discharge: 2023-09-02 | Disposition: A | Discharge status: Home / Self Care | Payer: PPO | Source: Ambulatory Visit | Attending: Pulmonary Disease | Admitting: Pulmonary Disease

## 2023-09-02 DIAGNOSIS — R0602 Shortness of breath: Secondary | ICD-10-CM | POA: Diagnosis not present

## 2023-09-02 NOTE — Progress Notes (Signed)
Daily Session Note  Patient Details  Name: Julie [name] MRN: [id number] Date of Birth: 1941/10/10 Referring Provider:   Doristine Devoid Pulmonary Rehab Walk Test from 07/16/2023 in [facility] for Heart, Vascular, & Lung Health  Referring Provider Everardo All       Encounter Date: 09/02/2023  Check In:  Session Check In - 09/02/23 1420       Check-In   Supervising physician immediately available to respond to emergencies [facility] MD immediately available    Physician(s) Carlyon Shadow, NP    Location [facility]    Staff Present Raford Pitcher, MS, ACSM-CEP, Exercise Physiologist;Casey Synthia Innocent, RN, BSN;Samantha [place], RD, LDN;Johnny Hale Bogus, MS, Exercise Physiologist    Virtual Visit No    Medication changes reported     No    Fall or balance concerns reported    No    Tobacco Cessation No Change    Warm-up and Cool-down Performed as group-led instruction    Resistance Training Performed Yes    VAD Patient? No      Pain Assessment   Currently in Pain? No/denies    Multiple Pain Sites No             Capillary Blood Glucose: No results found for this or any previous visit (from the past 24 hour(s)).    Social History   Tobacco Use  Smoking Status Never   Passive exposure: Past  Smokeless Tobacco Never    Goals Met:  Independence with exercise equipment Exercise tolerated well No report of concerns or symptoms today Strength training completed today  Goals Unmet:  Not Applicable  Comments: Service time is from 1314 to 1437    Dr. Mechele Collin is Medical Director for Pulmonary Rehab at [facility].

## 2023-09-07 ENCOUNTER — Encounter (HOSPITAL_COMMUNITY)
Admission: RE | Admit: 2023-09-07 | Discharge: 2023-09-07 | Disposition: A | Discharge status: Home / Self Care | Payer: PPO | Source: Ambulatory Visit | Attending: Pulmonary Disease | Admitting: Pulmonary Disease

## 2023-09-07 VITALS — Wt 131.6 lb

## 2023-09-07 DIAGNOSIS — R0602 Shortness of breath: Secondary | ICD-10-CM | POA: Insufficient documentation

## 2023-09-07 NOTE — Progress Notes (Signed)
Daily Session Note  Patient Details  Name: Julie [name] MRN: [id number] Date of Birth: 1941-04-23 Referring Provider:   Doristine Devoid Pulmonary Rehab Walk Test from 07/16/2023 in [facility] for Heart, Vascular, & Lung Health  Referring Provider Everardo All       Encounter Date: 09/07/2023  Check In:  Session Check In - 09/07/23 1414       Check-In   Supervising physician immediately available to respond to emergencies [facility] MD immediately available    Physician(s) Carlyon Shadow, NP    Location [facility]    Staff Present Raford Pitcher, MS, ACSM-CEP, Exercise Physiologist;Casey Synthia Innocent, RN, BSN;Samantha [place], RD, LDN;Randi [facility], ACSM-CEP, Exercise Physiologist    Virtual Visit No    Medication changes reported     No    Fall or balance concerns reported    No    Tobacco Cessation No Change    Warm-up and Cool-down Performed as group-led instruction    Resistance Training Performed Yes    VAD Patient? No    PAD/SET Patient? No      Pain Assessment   Currently in Pain? No/denies    Multiple Pain Sites No             Capillary Blood Glucose: No results found for this or any previous visit (from the past 24 hour(s)).    Social History   Tobacco Use  Smoking Status Never   Passive exposure: Past  Smokeless Tobacco Never    Goals Met:  Proper associated with RPD/PD & O2 Sat Independence with exercise equipment Exercise tolerated well No report of concerns or symptoms today Strength training completed today  Goals Unmet:  Not Applicable  Comments: Service time is from 1315 to 1450.    Dr. Mechele Collin is Medical Director for Pulmonary Rehab at [facility].

## 2023-09-09 ENCOUNTER — Encounter (HOSPITAL_COMMUNITY): Payer: PPO

## 2023-09-14 ENCOUNTER — Encounter (HOSPITAL_COMMUNITY): Payer: PPO

## 2023-09-16 ENCOUNTER — Encounter (HOSPITAL_COMMUNITY): Payer: PPO

## 2023-09-21 ENCOUNTER — Encounter (HOSPITAL_COMMUNITY): Payer: PPO

## 2023-09-23 ENCOUNTER — Encounter (HOSPITAL_COMMUNITY): Payer: PPO

## 2023-09-28 ENCOUNTER — Other Ambulatory Visit: Payer: Self-pay | Admitting: Family Medicine

## 2023-09-28 ENCOUNTER — Encounter (HOSPITAL_COMMUNITY)
Admission: RE | Admit: 2023-09-28 | Discharge: 2023-09-28 | Disposition: A | Discharge status: Home / Self Care | Payer: PPO | Source: Ambulatory Visit | Attending: Pulmonary Disease | Admitting: Pulmonary Disease

## 2023-09-28 ENCOUNTER — Encounter (HOSPITAL_COMMUNITY): Payer: Self-pay

## 2023-09-28 VITALS — Wt 130.3 lb

## 2023-09-28 DIAGNOSIS — R0602 Shortness of breath: Secondary | ICD-10-CM

## 2023-09-28 NOTE — Progress Notes (Signed)
Daily Session Note  Patient Details  Name: Julie [name] MRN: [id number] Date of Birth: 08/26/1941 Referring Provider:   Doristine Devoid Pulmonary Rehab Walk Test from 07/16/2023 in [facility]  Referring Provider Everardo All       Encounter Date: 09/28/2023  Check In:  Session Check In - 09/28/23 1322       Check-In   Supervising physician immediately available to respond to emergencies [facility] MD immediately available    Physician(s) Jari Favre, PA    Location [facility]    Staff Present Raford Pitcher, MS, ACSM-CEP, Exercise Physiologist;Casey Synthia Innocent, RN, BSN;Randi Reeve BS, ACSM-CEP, Exercise Physiologist    Virtual Visit No    Medication changes reported     No    Fall or balance concerns reported    No    Tobacco Cessation No Change    Warm-up and Cool-down Performed as group-led instruction    Resistance Training Performed Yes    VAD Patient? No    PAD/SET Patient? No      Pain Assessment   Currently in Pain? No/denies    Multiple Pain Sites No             Capillary Blood Glucose: No results found for this or any previous visit (from the past 24 hour(s)).   Exercise Prescription Changes - 09/28/23 1500       Response to Exercise   Blood Pressure (Admit) 132/64    Blood Pressure (Exercise) 130/70    Blood Pressure (Exit) 104/64    Heart Rate (Admit) 69 bpm    Heart Rate (Exercise) 110 bpm    Heart Rate (Exit) 85 bpm    Oxygen Saturation (Admit) 95 %    Oxygen Saturation (Exercise) 94 %    Oxygen Saturation (Exit) 94 %    Rating of Perceived Exertion (Exercise) 11    Perceived Dyspnea (Exercise) 3    Duration Continue with 30 min of aerobic exercise without signs/symptoms of physical distress.    Intensity THRR unchanged      Progression   Progression Continue to progress workloads to maintain intensity without signs/symptoms of physical distress.      Resistance  Training   Training Prescription Yes    Weight blue bands    Reps 10-15    Time 10 Minutes      Recumbant Bike   Level 3    Minutes 15    METs 2.9      Recumbant Elliptical   Level 4    Minutes 15    METs 3.4             Social History   Tobacco Use  Smoking Status Never   Passive exposure: Past  Smokeless Tobacco Never    Goals Met:  Independence with exercise equipment Exercise tolerated well No report of concerns or symptoms today Strength training completed today  Goals Unmet:  Not Applicable  Comments: Service time is from 1315 to 1445    Dr. Mechele Collin is Medical Director for Pulmonary Rehab at [facility].

## 2023-09-29 DIAGNOSIS — N3941 Urge incontinence: Secondary | ICD-10-CM | POA: Diagnosis not present

## 2023-09-29 NOTE — Progress Notes (Signed)
Pulmonary Individual Treatment Plan  Patient Details  Name: Julie [name] MRN: [id number] Date of Birth: 11-Oct-1941 Referring Provider:   Doristine Devoid Pulmonary Rehab Walk Test from 07/16/2023 in [facility]  Referring Provider Everardo All       Initial Encounter Date:  Flowsheet Row Pulmonary Rehab Walk Test from 07/16/2023 in [facility]  Date 07/16/23       Visit Diagnosis: Shortness of breath  Patient's Home Medications on Admission:   Current Outpatient Medications:    albuterol (VENTOLIN HFA) 108 (90 Base) MCG/ACT inhaler, INHALE 2 PUFFS INTO THE LUNGS EVERY 6 HOURS AS NEEDED FOR WHEEZING OR SHORTNESS OF BREATH, Disp: 6.7 g, Rfl: 0   atorvastatin (LIPITOR) 20 MG tablet, Take 1 tablet (20 mg total) by mouth daily., Disp: 90 tablet, Rfl: 3   gabapentin (NEURONTIN) 100 MG capsule, Take 100 mg by mouth as needed., Disp: , Rfl:    levothyroxine (SYNTHROID) 50 MCG tablet, TAKE 1 TABLET(50 MCG) BY MOUTH DAILY, Disp: 90 tablet, Rfl: 0   losartan (COZAAR) 50 MG tablet, Take 1 tablet (50 mg total) by mouth daily., Disp: 90 tablet, Rfl: 3   VITAMIN E PO, Take by mouth., Disp: , Rfl:   Past Medical History: 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   Other bursitis disorders    history   Personal history of colonic polyps    PONV (postoperative nausea and vomiting)    Pre-diabetes    Pure  hypercholesterolemia    Spondylolisthesis, cervical region    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    skin -spots on face, forehead and leg removed   SVD (spontaneous vaginal delivery)    x 1   Unspecified disorder of thyroid    Unspecified hypothyroidism     Tobacco Use: Social History   Tobacco Use  Smoking Status Never   Passive exposure: Past  Smokeless Tobacco Never    Labs: Review Flowsheet  More data exists      Latest Ref Rng & Units 01/16/2019 12/15/2019 09/02/2020 09/22/2021 02/09/2023  Labs for ITP Cardiac and Pulmonary Rehab  Cholestrol 100 - 199 mg/dL - - 756  - 433   LDL (calc) 0 - 99 mg/dL - - 295  - 188   HDL-C >39 mg/dL - - 49  - 51   Trlycerides 0 - 149 mg/dL - - 76  - 416   Hemoglobin A1c 4.0 - 5.6 % 5.6  6.0  - 5.6  -    Details            Capillary Blood Glucose: Lab Results  Component Value Date   GLUCAP 99 01/23/2017   GLUCAP 97 01/22/2017   GLUCAP 166 (H) 12/30/2012     Pulmonary Assessment Scores:  Pulmonary Assessment Scores  Row Name 07/16/23 1341         ADL UCSD   ADL Phase Entry     SOB Score total 60       CAT Score   CAT Score 19       mMRC Score   mMRC Score 4             UCSD: Self-administered rating of dyspnea associated with activities of daily living (ADLs) 6-point scale (0 = "not at all" to 5 = "maximal or unable to do because of breathlessness")  Scoring Scores range from 0 to 120.  Minimally important difference is 5 units  CAT: CAT can identify the health impairment of COPD patients and is better correlated with disease progression.  CAT has a scoring range of zero to 40. The CAT score is classified into four groups of low (less than 10), medium (10 - 20), high (21-30) and very high (31-40) based on the impact level of disease on health status. A CAT score over 10 suggests significant symptoms.  A worsening CAT score could be explained by an exacerbation, poor medication adherence, poor inhaler  technique, or progression of COPD or comorbid conditions.  CAT MCID is 2 points  mMRC: mMRC (Modified Medical Research Council) Dyspnea Scale is used to assess the degree of baseline functional disability in patients of respiratory disease due to dyspnea. No minimal important difference is established. A decrease in score of 1 point or greater is considered a positive change.   Pulmonary Function Assessment:  Pulmonary Function Assessment - 07/16/23 1311       Breath   Bilateral Breath Sounds Clear    Shortness of Breath Yes;Limiting activity             Exercise Target Goals: Exercise Program Goal: Individual exercise prescription set using results from initial 6 min walk test and THRR while considering  patient's activity barriers and safety.   Exercise Prescription Goal: Initial exercise prescription builds to 30-45 minutes a day of aerobic activity, 2-3 days per week.  Home exercise guidelines will be given to patient during program as part of exercise prescription that the participant will acknowledge.  Activity Barriers & Risk Stratification:  Activity Barriers & Cardiac Risk Stratification - 07/16/23 1310       Activity Barriers & Cardiac Risk Stratification   Activity Barriers History of Falls;Deconditioning;Muscular Weakness;Shortness of Breath;Balance Concerns;Back Problems             6 Minute Walk:  6 Minute Walk     Row Name 07/16/23 1400         6 Minute Walk   Phase Initial     Distance 823 feet     Walk Time 6 minutes     # of Rest Breaks 0     MPH 1.56     METS 1.2     RPE 11     Perceived Dyspnea  1     VO2 Peak 4.19     Resting HR 81 bpm     Resting BP 130/72     Resting Oxygen Saturation  94 %     Exercise Oxygen Saturation  during 6 min walk 92 %     Max Ex. HR 105 bpm     Max Ex. BP 132/82     2 Minute Post BP 128/80       Interval HR   1 Minute HR 95     2 Minute HR 107  3 Minute HR 103     4 Minute HR 105     5 Minute  HR 101     6 Minute HR 100     2 Minute Post HR 86     Interval Heart Rate? Yes       Interval Oxygen   Interval Oxygen? Yes     Baseline Oxygen Saturation % 94 %     1 Minute Oxygen Saturation % 93 %     1 Minute Liters of Oxygen 0 L     2 Minute Oxygen Saturation % 94 %     2 Minute Liters of Oxygen 0 L     3 Minute Oxygen Saturation % 97 %     3 Minute Liters of Oxygen 0 L     4 Minute Oxygen Saturation % 92 %     4 Minute Liters of Oxygen 0 L     5 Minute Oxygen Saturation % 94 %     5 Minute Liters of Oxygen 0 L     6 Minute Oxygen Saturation % 95 %     6 Minute Liters of Oxygen 0 L     2 Minute Post Oxygen Saturation % 97 %     2 Minute Post Liters of Oxygen 0 L              Oxygen Initial Assessment:  Oxygen Initial Assessment - 07/16/23 1310       Home Oxygen   Home Oxygen Device None    Sleep Oxygen Prescription None    Home Exercise Oxygen Prescription None    Home Resting Oxygen Prescription None      Initial 6 min Walk   Oxygen Used None      Program Oxygen Prescription   Program Oxygen Prescription None      Intervention   Short Term Goals To learn and understand importance of maintaining oxygen saturations>88%;To learn and demonstrate proper use of respiratory medications;To learn and understand importance of monitoring SPO2 with pulse oximeter and demonstrate accurate use of the pulse oximeter.;To learn and demonstrate proper pursed lip breathing techniques or other breathing techniques.     Long  Term Goals Maintenance of O2 saturations>88%;Compliance with respiratory medication;Demonstrates proper use of MDI's;Exhibits proper breathing techniques, such as pursed lip breathing or other method taught during program session;Verbalizes importance of monitoring SPO2 with pulse oximeter and return demonstration             Oxygen Re-Evaluation:  Oxygen Re-Evaluation     Row Name 07/27/23 0934 08/27/23 1130 09/21/23 0820         Program Oxygen  Prescription   Program Oxygen Prescription None None None       Home Oxygen   Home Oxygen Device None None None     Sleep Oxygen Prescription None None None     Home Exercise Oxygen Prescription None None None     Home Resting Oxygen Prescription None None None       Goals/Expected Outcomes   Short Term Goals To learn and understand importance of maintaining oxygen saturations>88%;To learn and demonstrate proper use of respiratory medications;To learn and understand importance of monitoring SPO2 with pulse oximeter and demonstrate accurate use of the pulse oximeter.;To learn and demonstrate proper pursed lip breathing techniques or other breathing techniques.  To learn and understand importance of maintaining oxygen saturations>88%;To learn and demonstrate proper use of respiratory medications;To learn and understand importance of monitoring SPO2 with pulse oximeter  and demonstrate accurate use of the pulse oximeter.;To learn and demonstrate proper pursed lip breathing techniques or other breathing techniques.  To learn and understand importance of maintaining oxygen saturations>88%;To learn and demonstrate proper use of respiratory medications;To learn and understand importance of monitoring SPO2 with pulse oximeter and demonstrate accurate use of the pulse oximeter.;To learn and demonstrate proper pursed lip breathing techniques or other breathing techniques.      Long  Term Goals Maintenance of O2 saturations>88%;Compliance with respiratory medication;Demonstrates proper use of MDI's;Exhibits proper breathing techniques, such as pursed lip breathing or other method taught during program session;Verbalizes importance of monitoring SPO2 with pulse oximeter and return demonstration Maintenance of O2 saturations>88%;Compliance with respiratory medication;Demonstrates proper use of MDI's;Exhibits proper breathing techniques, such as pursed lip breathing or other method taught during program  session;Verbalizes importance of monitoring SPO2 with pulse oximeter and return demonstration Maintenance of O2 saturations>88%;Compliance with respiratory medication;Demonstrates proper use of MDI's;Exhibits proper breathing techniques, such as pursed lip breathing or other method taught during program session;Verbalizes importance of monitoring SPO2 with pulse oximeter and return demonstration     Goals/Expected Outcomes Compliance and understanding of oxygen saturation monitoring and breathing techniques to decrease shortness of breath Compliance and understanding of oxygen saturation monitoring and breathing techniques to decrease shortness of breath Compliance and understanding of oxygen saturation monitoring and breathing techniques to decrease shortness of breath              Oxygen Discharge (Final Oxygen Re-Evaluation):  Oxygen Re-Evaluation - 09/21/23 0820       Program Oxygen Prescription   Program Oxygen Prescription None      Home Oxygen   Home Oxygen Device None    Sleep Oxygen Prescription None    Home Exercise Oxygen Prescription None    Home Resting Oxygen Prescription None      Goals/Expected Outcomes   Short Term Goals To learn and understand importance of maintaining oxygen saturations>88%;To learn and demonstrate proper use of respiratory medications;To learn and understand importance of monitoring SPO2 with pulse oximeter and demonstrate accurate use of the pulse oximeter.;To learn and demonstrate proper pursed lip breathing techniques or other breathing techniques.     Long  Term Goals Maintenance of O2 saturations>88%;Compliance with respiratory medication;Demonstrates proper use of MDI's;Exhibits proper breathing techniques, such as pursed lip breathing or other method taught during program session;Verbalizes importance of monitoring SPO2 with pulse oximeter and return demonstration    Goals/Expected Outcomes Compliance and understanding of oxygen saturation  monitoring and breathing techniques to decrease shortness of breath             Initial Exercise Prescription:  Initial Exercise Prescription - 07/16/23 1400       Date of Initial Exercise RX and Referring Provider   Date 07/16/23    Referring Provider Everardo All    Expected Discharge Date 10/14/23      Recumbant Bike   Level 1    Minutes 15    METs 1.5      Recumbant Elliptical   Level 1    Minutes 15    METs 2      Prescription Details   Frequency (times per week) 2    Duration Progress to 30 minutes of continuous aerobic without signs/symptoms of physical distress      Intensity   THRR 40-80% of Max Heartrate 55-110    Ratings of Perceived Exertion 11-13    Perceived Dyspnea 0-4      Progression   Progression Continue to  progress workloads to maintain intensity without signs/symptoms of physical distress.      Resistance Training   Training Prescription Yes    Weight red bands    Reps 10-15             Perform Capillary Blood Glucose checks as needed.  Exercise Prescription Changes:   Exercise Prescription Changes     Row Name 08/03/23 1500 08/17/23 1236 08/26/23 1500 08/31/23 1500 09/07/23 1237     Response to Exercise   Blood Pressure (Admit) 122/70 122/74 -- 114/76 142/72  running late, leaving for [place] today after class   Blood Pressure (Exercise) 158/76 -- -- 144/72 --   Blood Pressure (Exit) 102/62 116/72 -- 120/68 104/68   Heart Rate (Admit) 85 bpm 98 bpm -- 75 bpm 88 bpm   Heart Rate (Exercise) 103 bpm 119 bpm -- 120 bpm 118 bpm   Heart Rate (Exit) 84 bpm 102 bpm -- 90 bpm 94 bpm   Oxygen Saturation (Admit) 93 % 95 % -- 96 % 95 %   Oxygen Saturation (Exercise) 94 % 92 % -- 93 % 95 %   Oxygen Saturation (Exit) 93 % 96 % -- 95 % 95 %   Rating of Perceived Exertion (Exercise) 13 11 -- 12 12   Perceived Dyspnea (Exercise) 3 2 -- 2 2   Duration Continue with 30 min of aerobic exercise without signs/symptoms of physical distress. Continue with  30 min of aerobic exercise without signs/symptoms of physical distress. -- Continue with 30 min of aerobic exercise without signs/symptoms of physical distress. Continue with 30 min of aerobic exercise without signs/symptoms of physical distress.   Intensity THRR unchanged THRR unchanged -- THRR unchanged THRR unchanged     Progression   Progression Continue to progress workloads to maintain intensity without signs/symptoms of physical distress. Continue to progress workloads to maintain intensity without signs/symptoms of physical distress. -- Continue to progress workloads to maintain intensity without signs/symptoms of physical distress. Continue to progress workloads to maintain intensity without signs/symptoms of physical distress.   Average METs 3.7 -- -- -- --     Paramedic Prescription Yes Yes -- Yes Yes   Weight blue bands blue bands -- blue bands blue bands   Reps 10-15 10-15 -- 10-15 10-15   Time 10 Minutes 10 Minutes -- 10 Minutes 10 Minutes     Recumbant Bike   Level 2 2 -- 3 3   RPM 60 52 -- 51 51   Watts -- -- -- 26 25   Minutes 15 15 -- 15 15   METs 2.6 2.2 -- 2.9 2.8     Recumbant Elliptical   Level 2 3 -- 3 4   RPM -- -- -- 58 --   Watts -- -- -- 77 --   Minutes 15 15 -- 15 15   METs 3.7 3.8 -- 4.8 4.5     Home Exercise Plan   Plans to continue exercise at -- -- Home (comment)  walking at [employer] -- --   Frequency -- -- Add 2 additional days to program exercise sessions. -- --   Initial Home Exercises Provided -- -- 08/26/23 -- --    Row Name 09/28/23 1500             Response to Exercise   Blood Pressure (Admit) 132/64       Blood Pressure (Exercise) 130/70       Blood Pressure (Exit) 104/64  Heart Rate (Admit) 69 bpm       Heart Rate (Exercise) 110 bpm       Heart Rate (Exit) 85 bpm       Oxygen Saturation (Admit) 95 %       Oxygen Saturation (Exercise) 94 %       Oxygen Saturation (Exit) 94 %       Rating of Perceived  Exertion (Exercise) 11       Perceived Dyspnea (Exercise) 3       Duration Continue with 30 min of aerobic exercise without signs/symptoms of physical distress.       Intensity THRR unchanged         Progression   Progression Continue to progress workloads to maintain intensity without signs/symptoms of physical distress.         Resistance Training   Training Prescription Yes       Weight blue bands       Reps 10-15       Time 10 Minutes         Recumbant Bike   Level 3       Minutes 15       METs 2.9         Recumbant Elliptical   Level 4       Minutes 15       METs 3.4                Exercise Comments:   Exercise Comments     Row Name 07/22/23 1509 08/26/23 1534         Exercise Comments Pt completed first day of group exercise. She exercised on recumbent bike for 15 min, level 1, METs 1.9. She then exercised on the recumbent elliptical, level 1, METs 3.1 for 15 min. Pt tolerated well. Able to perform warm up and cool down exercises with chair support. Discussed METs with good restrictions. Discussed with pt home exercise plan. She is not currently exercising at home but wants to start so she can get ready for a trip to [place] in a couple weeks. Discussed starting to walk at [facility] [employer] or other large store using a cane. Encouraged starting with 15 min and building up to 30 min, 1-2 nonrehab days a week. She is agreeable.               Exercise Goals and Review:   Exercise Goals     Row Name 07/16/23 1310             Exercise Goals   Increase Physical Activity Yes       Intervention Provide advice, education, support and counseling about physical activity/exercise needs.;Develop an individualized exercise prescription for aerobic and resistive training based on initial evaluation findings, risk stratification, comorbidities and participant's personal goals.       Expected Outcomes Short Term: Attend rehab on a regular basis to increase amount of  physical activity.;Long Term: Exercising regularly at least 3-5 days a week.;Long Term: Add in home exercise to make exercise part of routine and to increase amount of physical activity.       Increase Strength and Stamina Yes       Intervention Provide advice, education, support and counseling about physical activity/exercise needs.;Develop an individualized exercise prescription for aerobic and resistive training based on initial evaluation findings, risk stratification, comorbidities and participant's personal goals.       Expected Outcomes Short Term: Increase workloads from initial exercise prescription for resistance, speed,  and METs.;Short Term: Perform resistance training exercises routinely during rehab and add in resistance training at home;Long Term: Improve cardiorespiratory fitness, muscular endurance and strength as measured by increased METs and functional capacity ( )       Able to understand and use rate of perceived exertion (RPE) scale Yes       Intervention Provide education and explanation on how to use RPE scale       Expected Outcomes Short Term: Able to use RPE daily in rehab to express subjective intensity level;Long Term:  Able to use RPE to guide intensity level when exercising independently       Able to understand and use Dyspnea scale Yes       Intervention Provide education and explanation on how to use Dyspnea scale       Expected Outcomes Short Term: Able to use Dyspnea scale daily in rehab to express subjective sense of shortness of breath during exertion;Long Term: Able to use Dyspnea scale to guide intensity level when exercising independently       Knowledge and understanding of Target Heart Rate Range (THRR) Yes       Intervention Provide education and explanation of THRR including how the numbers were predicted and where they are located for reference       Expected Outcomes Short Term: Able to state/look up THRR;Short Term: Able to use daily as guideline for  intensity in rehab;Long Term: Able to use THRR to govern intensity when exercising independently       Understanding of Exercise Prescription Yes       Intervention Provide education, explanation, and written materials on patient's individual exercise prescription       Expected Outcomes Short Term: Able to explain program exercise prescription;Long Term: Able to explain home exercise prescription to exercise independently                Exercise Goals Re-Evaluation :  Exercise Goals Re-Evaluation     Row Name 07/27/23 0932 08/27/23 1125 09/21/23 0817         Exercise Goal Re-Evaluation   Exercise Goals Review Increase Physical Activity;Able to understand and use Dyspnea scale;Understanding of Exercise Prescription;Increase Strength and Stamina;Knowledge and understanding of Target Heart Rate Range (THRR);Able to understand and use rate of perceived exertion (RPE) scale Increase Physical Activity;Able to understand and use Dyspnea scale;Understanding of Exercise Prescription;Increase Strength and Stamina;Knowledge and understanding of Target Heart Rate Range (THRR);Able to understand and use rate of perceived exertion (RPE) scale Increase Physical Activity;Able to understand and use Dyspnea scale;Understanding of Exercise Prescription;Increase Strength and Stamina;Knowledge and understanding of Target Heart Rate Range (THRR);Able to understand and use rate of perceived exertion (RPE) scale     Comments Pt has completed one exercise session. She exercised on recumbent bike for 15 min, level 1, METs 1.9. She then exercised on the recumbent elliptical, level 1, METs 3.1 for 15 min. Pt tolerated well. Able to perform warm up and cool down exercises with chair support. Will progress pt as tolerated. Pt has completed 8 exercise session, missing 1. She is exercising on recumbent bike for 15 min, level 3, METs 2.7. She then is exercising on the recumbent elliptical, level 3, METs 3.3 for 15 min. She has  progressed well. She is going to [place] in a few weeks and wants to increase her walking ability therefore attempted to walk on the treadmill the last session. However she felt the treadmill too fast at 1.0 mph and did not feel safe.  Therefore discussed home exercise and walking at home to increase her ability in preparation for her trip. She is no longer requiring chair support for cool down exercises but is still on red bands. Will continue to progress. Pt has completed 13 exercise sessions but has been on vacation for the last 3 sessions and she will miss 2 more. She is exercising on recumbent bike for 15 min, level 3, METs 2.8. She then is exercising on the recumbent elliptical, level 4, METs 4.5 for 15 min. She has progressed well, pushing herself to prepare for her trip to [place] that included significant ambulation. She is still using red bands. Will progress as tolerated when she returns.     Expected Outcomes Through exercise at rehab and home, the patient will decrease shortness of breath with daily activities and feel confident in carrying out an exercise regimen at home Through exercise at rehab and home, the patient will decrease shortness of breath with daily activities and feel confident in carrying out an exercise regimen at home Through exercise at rehab and home, the patient will decrease shortness of breath with daily activities and feel confident in carrying out an exercise regimen at home              Discharge Exercise Prescription (Final Exercise Prescription Changes):  Exercise Prescription Changes - 09/28/23 1500       Response to Exercise   Blood Pressure (Admit) 132/64    Blood Pressure (Exercise) 130/70    Blood Pressure (Exit) 104/64    Heart Rate (Admit) 69 bpm    Heart Rate (Exercise) 110 bpm    Heart Rate (Exit) 85 bpm    Oxygen Saturation (Admit) 95 %    Oxygen Saturation (Exercise) 94 %    Oxygen Saturation (Exit) 94 %    Rating of Perceived Exertion (Exercise)  11    Perceived Dyspnea (Exercise) 3    Duration Continue with 30 min of aerobic exercise without signs/symptoms of physical distress.    Intensity THRR unchanged      Progression   Progression Continue to progress workloads to maintain intensity without signs/symptoms of physical distress.      Resistance Training   Training Prescription Yes    Weight blue bands    Reps 10-15    Time 10 Minutes      Recumbant Bike   Level 3    Minutes 15    METs 2.9      Recumbant Elliptical   Level 4    Minutes 15    METs 3.4             Nutrition:  Target Goals: Understanding of nutrition guidelines, daily intake of sodium 1500mg , cholesterol 200mg , calories 30% from fat and 7% or less from saturated fats, daily to have 5 or more servings of fruits and vegetables.  Biometrics:  Pre Biometrics - 07/16/23 1305       Pre Biometrics   Grip Strength 10 kg              Nutrition Therapy Plan and Nutrition Goals:  Nutrition Therapy & Goals - 09/23/23 1158       Nutrition Therapy   Diet Heart Healthy Diet    Drug/Food Interactions Statins/Certain Fruits      Personal Nutrition Goals   Nutrition Goal Patient to improve diet quality by using the plate method as a guide for meal planning to include lean protein/plant protein, fruits, vegetables, whole grains, nonfat dairy as part  of a well-balanced diet.   goal in progress.   Personal Goal #2 Patient to identify strategies for weight loss of 0.5-2.0# per week.   goal in progress.   Comments Goal in progress. Brice has not attended pulmonary rehab since 11/5 due to vacation. Samera reports motivation to lose weight with goal weight of 100#.  She reports craving sweets/carbohydrates and over-snacking at night. She is down 3.3# since starting with our program. Her lipid panel remains elevated. Patient will benefit from participation in pulmonary rehab for nutrition, exercise, and lifestyle modification.      Intervention Plan    Intervention Prescribe, educate and counsel regarding individualized specific dietary modifications aiming towards targeted core components such as weight, hypertension, lipid management, diabetes, heart failure and other comorbidities.;Nutrition handout(s) given to patient.    Expected Outcomes Short Term Goal: Understand basic principles of dietary content, such as calories, fat, sodium, cholesterol and nutrients.;Long Term Goal: Adherence to prescribed nutrition plan.             Nutrition Assessments:  Nutrition Assessments - 07/29/23 0949       Rate Your Plate Scores   Pre Score 54            MEDIFICTS Score Key: >=70 Need to make dietary changes  40-70 Heart Healthy Diet <= 40 Therapeutic Level Cholesterol Diet  Flowsheet Row PULMONARY REHAB OTHER RESPIRATORY from 07/22/2023 in [facility]  Picture Your Plate Total Score on Admission 54      Picture Your Plate Scores: <16 Unhealthy dietary pattern with much room for improvement. 41-50 Dietary pattern unlikely to meet recommendations for good health and room for improvement. 51-60 More healthful dietary pattern, with some room for improvement.  >60 Healthy dietary pattern, although there may be some specific behaviors that could be improved.    Nutrition Goals Re-Evaluation:  Nutrition Goals Re-Evaluation     Row Name 07/30/23 1434 08/26/23 1454 09/23/23 1158         Goals   Current Weight 134 lb 14.7 oz (61.2 kg) 130 lb 15.3 oz (59.4 kg) 131 lb 9.8 oz (59.7 kg)     Comment LDL 168, cholesterol 241 no new labs; most recent labs  LDL 168, cholesterol 241 (lipitor) no new labs; most recent labs LDL 168, cholesterol 241 (lipitor)     Expected Outcome Nykhia reports motivation to lose weight with goal weight of 100#. She reports craving sweets/carbohydrates and over-snacking at night. Her lipid panel remains elevated. Patient will benefit from participation in  pulmonary rehab for nutrition, exercise, and lifestyle modification. Goal in progress. Cynthie reports motivation to lose weight with goal weight of 100#. She reports craving sweets/carbohydrates and over-snacking at night. She is down 4# since starting with our program. Her lipid panel remains elevated. Patient will benefit from participation in pulmonary rehab for nutrition, exercise, and lifestyle modification. Goal in progress. Erla has not attended pulmonary rehab since 11/5 due to vacation. Chamari reports motivation to lose weight with goal weight of 100#. She reports craving sweets/carbohydrates and over-snacking at night. She is down 3.3# since starting with our program. Her lipid panel remains elevated. Patient will benefit from participation in pulmonary rehab for nutrition, exercise, and lifestyle modification.              Nutrition Goals Discharge (Final Nutrition Goals Re-Evaluation):  Nutrition Goals Re-Evaluation - 09/23/23 1158       Goals   Current Weight 131 lb  9.8 oz (59.7 kg)    Comment no new labs; most recent labs LDL 168, cholesterol 241 (lipitor)    Expected Outcome Goal in progress. Lorell has not attended pulmonary rehab since 11/5 due to vacation. Shenna reports motivation to lose weight with goal weight of 100#. She reports craving sweets/carbohydrates and over-snacking at night. She is down 3.3# since starting with our program. Her lipid panel remains elevated. Patient will benefit from participation in pulmonary rehab for nutrition, exercise, and lifestyle modification.             Psychosocial: Target Goals: Acknowledge presence or absence of significant depression and/or stress, maximize coping skills, provide positive support system. Participant is able to verbalize types and ability to use techniques and skills needed for reducing stress and depression.  Initial Review & Psychosocial Screening:  Initial Psych Review & Screening - 07/16/23 1312        Initial Review   Current issues with Current Sleep Concerns;Current Stress Concerns    Source of Stress Concerns Unable to participate in former interests or hobbies      Family Dynamics   Good Support System? Yes    Comments Supportive husband      Barriers   Psychosocial barriers to participate in program The patient should benefit from training in stress management and relaxation.      Screening Interventions   Interventions Encouraged to exercise;Provide feedback about the scores to participant    Expected Outcomes Short Term goal: Utilizing psychosocial counselor, staff and physician to assist with identification of specific Stressors or current issues interfering with healing process. Setting desired goal for each stressor or current issue identified.;Long Term Goal: Stressors or current issues are controlled or eliminated.;Short Term goal: Identification and review with participant of any Quality of Life or Depression concerns found by scoring the questionnaire.;Long Term goal: The participant improves quality of Life and PHQ9 Scores as seen by post scores and/or verbalization of changes             Quality of Life Scores:  Scores of 19 and below usually indicate a poorer quality of life in these areas.  A difference of  2-3 points is a clinically meaningful difference.  A difference of 2-3 points in the total score of the Quality of Life Index has been associated with significant improvement in overall quality of life, self-image, physical symptoms, and general health in studies assessing change in quality of life.  PHQ-9: Review Flowsheet  More data exists      07/16/2023 12/29/2022 12/07/2022 08/28/2022 09/30/2021  Depression screen PHQ 2/9  Decreased Interest 0 0 0 0 0  Down, Depressed, Hopeless 0 0 0 0 0  PHQ - 2 Score 0 0 0 0 0  Altered sleeping 2 3 2  - -  Tired, decreased energy 1 2 2  - -  Change in appetite 1 0 1 - -  Feeling bad or failure about yourself  0 0 0 - -   Trouble concentrating 0 0 0 - -  Moving slowly or fidgety/restless 0 0 0 - -  Suicidal thoughts 0 0 0 - -  PHQ-9 Score 4 5 5  - -  Difficult doing work/chores Somewhat difficult Somewhat difficult Not difficult at all - -    Details           Interpretation of Total Score  Total Score Depression Severity:  1-4 = Minimal depression, 5-9 = Mild depression, 10-14 = Moderate depression, 15-19 = Moderately severe depression, 20-27 =  Severe depression   Psychosocial Evaluation and Intervention:  Psychosocial Evaluation - 07/16/23 1316       Psychosocial Evaluation & Interventions   Interventions Encouraged to exercise with the program and follow exercise prescription    Comments Jamaia is not sleeping well. She has tried Rx meds in the past but couldn't tolerate. She is hoping exercise will help with sleep. Samridhi is also discouraged that she doesn't have enough energy to walk far distances. 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    Expected Outcomes For Hema to particiapte in [place] rehab with decreased stress related to her lung disease and more quality restful sleep.    Continue Psychosocial Services  No Follow up required             Psychosocial Re-Evaluation:  Psychosocial Re-Evaluation     Row Name 07/28/23 1128 08/27/23 1320 09/21/23 0854         Psychosocial Re-Evaluation   Current issues with Current Sleep Concerns;Current Stress Concerns Current Sleep Concerns;Current Stress Concerns Current Sleep Concerns;Current Stress Concerns     Comments No changes since orientation. Rosalynd has completed 1 session. Avya still endorses concern about her sleep habits. We have encouraged Harriett Sine to implement a sleep routine and stick to it, put up electronics at least an hour before bed, and go to bed around the same time each night. She stated that she does get more restful sleep since starting PR. She is  both excited and concerned about her vacation coming up, in [place]. She states there will be a lot of walking and she's not sure if she is going to be able to keep up with her family. We have started getting Pamila ready to go on the trip with increased walking during rehab. She is also walking at home. We have encouraged Marybelle that while she's on vacation to take breaks and listen to her body. She declines any psychosocial needs at this time. Juridia has not been to [employer] [facility] since 11/5. She is currently on vacation to [place] and [place]. She was concerned about her trip, with all the walking she would be doing, and if she could keep up with her family. Staff gave Harriett Sine encouragement since she has made great progress so far in the program. We also educated on self-pacing, listening to her body, and performing purse lipped breathing.     Expected Outcomes For Melma to continue to attend pulmonary rehab and to have a positive outlook and good coping skills to manage her stress. To get better quality of sleep. For Tanza to continue to attend pulmonary rehab and to have a positive outlook and good coping skills to manage her stress. To get better quality of sleep. For Skylei to continue to attend pulmonary rehab and to have a positive outlook and good coping skills to manage her stress.     Interventions Encouraged to attend Pulmonary Rehabilitation for the exercise;Stress management education Encouraged to attend Pulmonary Rehabilitation for the exercise;Stress management education Encouraged to attend Pulmonary Rehabilitation for the exercise     Continue Psychosocial Services  No Follow up required No Follow up required No Follow up required              Psychosocial Discharge (Final Psychosocial Re-Evaluation):  Psychosocial Re-Evaluation - 09/21/23 0854       Psychosocial Re-Evaluation   Current issues with Current Sleep Concerns;Current Stress Concerns  Comments Janell has not been to [facility] [facility]  since 11/5. She is currently on vacation to [place] and [place]. She was concerned about her trip, with all the walking she would be doing, and if she could keep up with her family. Staff gave Harriett Sine encouragement since she has made great progress so far in the program. We also educated on self-pacing, listening to her body, and performing purse lipped breathing.    Expected Outcomes For Aaryn to continue to attend pulmonary rehab and to have a positive outlook and good coping skills to manage her stress.    Interventions Encouraged to attend Pulmonary Rehabilitation for the exercise    Continue Psychosocial Services  No Follow up required             Education: Education Goals: Education classes will be provided on a weekly basis, covering required topics. Participant will state understanding/return demonstration of topics presented.  Learning Barriers/Preferences:  Learning Barriers/Preferences - 07/16/23 1356       Learning Barriers/Preferences   Learning Barriers None    Learning Preferences Verbal Instruction;Written Material;Video;Individual Instruction             Education Topics: Know Your Numbers Group instruction that is supported by a PowerPoint presentation. Instructor discusses importance of knowing and understanding resting, exercise, and post-exercise oxygen saturation, heart rate, and blood pressure. Oxygen saturation, heart rate, blood pressure, rating of perceived exertion, and dyspnea are reviewed along with a normal range for these values.  Flowsheet Row PULMONARY REHAB OTHER RESPIRATORY from 08/05/2023 in [facility], [facility]  Date 08/05/23  Educator EP  Instruction Review Code 1- Verbalizes Understanding       Exercise for the Pulmonary Patient Group instruction that is supported by a PowerPoint presentation. Instructor discusses benefits of exercise, core components of exercise, frequency, duration, and  intensity of an exercise routine, importance of utilizing pulse oximetry during exercise, safety while exercising, and options of places to exercise outside of rehab.  Flowsheet Row PULMONARY REHAB OTHER RESPIRATORY from 07/29/2023 in [facility] for Heart, Vascular, & Lung Health  Date 07/29/23  Educator EP  Instruction Review Code 1- Verbalizes Understanding       MET Level  Group instruction provided by PowerPoint, verbal discussion, and written material to support subject matter. Instructor reviews what METs are and how to increase METs.    Pulmonary Medications Verbally interactive group education provided by instructor with focus on inhaled medications and proper administration. Flowsheet Row PULMONARY REHAB OTHER RESPIRATORY from 07/22/2023 in [facility] for Heart, Vascular, & Lung Health  Date 07/22/23  Educator RT  Instruction Review Code 1- Verbalizes Understanding       Anatomy and Physiology of the Respiratory System Group instruction provided by PowerPoint, verbal discussion, and written material to support subject matter. Instructor reviews respiratory cycle and anatomical components of the respiratory system and their functions. Instructor also reviews differences in obstructive and restrictive respiratory diseases with examples of each.    Oxygen Safety Group instruction provided by PowerPoint, verbal discussion, and written material to support subject matter. There is an overview of "What is Oxygen" and "Why do we need it".  Instructor also reviews how to create a safe environment for oxygen use, the importance of using oxygen as prescribed, and the risks of noncompliance. There is a brief discussion on traveling with oxygen and resources the patient may utilize. Flowsheet Row PULMONARY REHAB OTHER RESPIRATORY from  08/12/2023 in [facility] for Heart, Vascular, & Lung Health  Date 08/12/23  Educator  RN  Instruction Review Code 1- Verbalizes Understanding       Oxygen Use Group instruction provided by PowerPoint, verbal discussion, and written material to discuss how supplemental oxygen is prescribed and different types of oxygen supply systems. Resources for more information are provided.    Breathing Techniques Group instruction that is supported by demonstration and informational handouts. Instructor discusses the benefits of pursed lip and diaphragmatic breathing and detailed demonstration on how to perform both.  Flowsheet Row PULMONARY REHAB OTHER RESPIRATORY from 08/26/2023 in [facility] for Heart, Vascular, & Lung Health  Date 08/26/23  Educator RN  Instruction Review Code 1- Verbalizes Understanding        Risk Factor Reduction Group instruction that is supported by a PowerPoint presentation. Instructor discusses the definition of a risk factor, different risk factors for pulmonary disease, and how the heart and lungs work together.   Pulmonary Diseases Group instruction provided by PowerPoint, verbal discussion, and written material to support subject matter. Instructor gives an overview of the different type of pulmonary diseases. There is also a discussion on risk factors and symptoms as well as ways to manage the diseases.   Stress and Energy Conservation Group instruction provided by PowerPoint, verbal discussion, and written material to support subject matter. Instructor gives an overview of stress and the impact it can have on the body. Instructor also reviews ways to reduce stress. There is also a discussion on energy conservation and ways to conserve energy throughout the day. Flowsheet Row PULMONARY REHAB OTHER RESPIRATORY from 09/02/2023 in [facility] for Heart, Vascular, & Lung Health  Date 09/02/23  Educator RN  Instruction Review Code 1- Verbalizes Understanding       Warning Signs and Symptoms Group  instruction provided by PowerPoint, verbal discussion, and written material to support subject matter. Instructor reviews warning signs and symptoms of stroke, heart attack, cold and flu. Instructor also reviews ways to prevent the spread of infection.   Other Education Group or individual verbal, written, or video instructions that support the educational goals of the pulmonary rehab program.    Knowledge Questionnaire Score:  Knowledge Questionnaire Score - 07/16/23 1343       Knowledge Questionnaire Score   Pre Score 14/18             Core Components/Risk Factors/Patient Goals at Admission:  Personal Goals and Risk Factors at Admission - 07/16/23 1317       Core Components/Risk Factors/Patient Goals on Admission    Weight Management Yes;Weight Loss    Intervention Weight Management: Develop a combined nutrition and exercise program designed to reach desired caloric intake, while maintaining appropriate intake of nutrient and fiber, sodium and fats, and appropriate energy expenditure required for the weight goal.;Weight Management: Provide education and appropriate resources to help participant work on and attain dietary goals.;Weight Management/Obesity: Establish reasonable short term and long term weight goals.;Obesity: Provide education and appropriate resources to help participant work on and attain dietary goals.    Expected Outcomes Short Term: Continue to assess and modify interventions until short term weight is achieved;Long Term: Adherence to nutrition and physical activity/exercise program aimed toward attainment of established weight goal;Weight Loss: Understanding of general recommendations for a balanced deficit meal plan, which promotes 1-2 lb weight loss per week and includes a negative energy balance of [phone] kcal/d;Understanding of distribution of calorie  intake throughout the day with the consumption of 4-5 meals/snacks;Understanding recommendations for meals to  include 15-35% energy as protein, 25-35% energy from fat, 35-60% energy from carbohydrates, less than 200mg  of dietary cholesterol, 20-35 gm of total fiber daily    Improve shortness of breath with ADL's Yes    Intervention Provide education, individualized exercise plan and daily activity instruction to help decrease symptoms of SOB with activities of daily living.    Expected Outcomes Short Term: Improve cardiorespiratory fitness to achieve a reduction of symptoms when performing ADLs;Long Term: Be able to perform more ADLs without symptoms or delay the onset of symptoms             Core Components/Risk Factors/Patient Goals Review:   Goals and Risk Factor Review     Row Name 07/28/23 1130 08/27/23 1331 09/21/23 0900         Core Components/Risk Factors/Patient Goals Review   Personal Goals Review Weight Management/Obesity;Improve shortness of breath with ADL's;Develop more efficient breathing techniques such as purse lipped breathing and diaphragmatic breathing and practicing self-pacing with activity. Weight Management/Obesity;Improve shortness of breath with ADL's;Develop more efficient breathing techniques such as purse lipped breathing and diaphragmatic breathing and practicing self-pacing with activity. Weight Management/Obesity;Improve shortness of breath with ADL's;Develop more efficient breathing techniques such as purse lipped breathing and diaphragmatic breathing and practicing self-pacing with activity.     Review Unable to assess. Coline has completed 1 session. Goal in action for weight loss. Tykera has lost ~4# since starting the program. She is working with our dietician on a plan of action and is open to the ideas and suggestions. Goal progressing on developing more efficient breathing techniques such as purse lipped breathing and diaphragmatic breathing; and practicing self-pacing with activity. We are teaching Harriett Sine how and when to initiate PLB and listening to her body to  self-pace herself while exercising. We have been working on this for her upcoming trip to [place]. Goal in progress on improving her shortness of breath with ADLs. She has increased both her workload and METs while maintaining her oxygen saturation >88% on room air. We will continue to monitor and assess Taelor's progress in the program. Dennie Bible will continue to benefit from PR for nutrition, education, exercise, and lifestyle modification. Mahaila has not been to [facility] since 11/5. She is currently on vacation to [place] and [place]. Goal in action for weight loss. Nickola has lost ~3# since starting the program. She is working with our dietician on a plan of action and is open to the ideas and suggestions. Goal progressing on developing more efficient breathing techniques such as purse lipped breathing and diaphragmatic breathing; and practicing self-pacing with activity. We are teaching Harriett Sine how and when to initiate PLB and listening to her body to self-pace herself while exercising. Goal met on improving her shortness of breath with ADLs. She has increased both her workload and METs while maintaining her oxygen saturation >88% on room air. We will continue to monitor and assess Kashira's progress in the program. Madelen will continue to benefit from PR for nutrition, education, exercise, and lifestyle modification.     Expected Outcomes For Jules to lose weight, improve her shortness of breath with ADLs, develop more efficient breathing techniques, and learn how to self-pace. For Harriett Sine to lose weight, improve her shortness of breath with ADLs, develop more efficient breathing techniques, and learn how to self-pace. For Harriett Sine to lose weight, develop more efficient breathing techniques, and learn how to self-pace.  Core Components/Risk Factors/Patient Goals at Discharge (Final Review):   Goals and Risk Factor Review - 09/21/23 0900       Core Components/Risk Factors/Patient Goals Review   Personal  Goals Review Weight Management/Obesity;Improve shortness of breath with ADL's;Develop more efficient breathing techniques such as purse lipped breathing and diaphragmatic breathing and practicing self-pacing with activity.    Review Shabana has not been to [employer] [facility] since 11/5. She is currently on vacation to [place] and [place]. Goal in action for weight loss. Sammye has lost ~3# since starting the program. She is working with our dietician on a plan of action and is open to the ideas and suggestions. Goal progressing on developing more efficient breathing techniques such as purse lipped breathing and diaphragmatic breathing; and practicing self-pacing with activity. We are teaching Harriett Sine how and when to initiate PLB and listening to her body to self-pace herself while exercising. Goal met on improving her shortness of breath with ADLs. She has increased both her workload and METs while maintaining her oxygen saturation >88% on room air. We will continue to monitor and assess Fynley's progress in the program. Trevor will continue to benefit from PR for nutrition, education, exercise, and lifestyle modification.    Expected Outcomes For Briann to lose weight, develop more efficient breathing techniques, and learn how to self-pace.             ITP Comments: Pt is making expected progress toward Pulmonary Rehab goals after completing 12 session(s). Recommend continued exercise, life style modification, education, and utilization of breathing techniques to increase stamina and strength, while also decreasing shortness of breath with exertion.    Comments: Dr. Mechele Collin is Medical Director for Pulmonary Rehab at [facility].

## 2023-10-05 ENCOUNTER — Encounter (HOSPITAL_COMMUNITY)
Admission: RE | Admit: 2023-10-05 | Discharge: 2023-10-05 | Disposition: A | Discharge status: Home / Self Care | Payer: PPO | Source: Ambulatory Visit | Attending: Pulmonary Disease | Admitting: Pulmonary Disease

## 2023-10-05 DIAGNOSIS — R0602 Shortness of breath: Secondary | ICD-10-CM | POA: Diagnosis not present

## 2023-10-05 NOTE — Progress Notes (Signed)
Daily Session Note  Patient Details  Name: Julie [name] MRN: [id number] Date of Birth: 08-Jan-1941 Referring Provider:   Doristine Devoid Pulmonary Rehab Walk Test from 07/16/2023 in [facility] for Heart, Vascular, & Lung Health  Referring Provider Everardo All       Encounter Date: 10/05/2023  Check In:  Session Check In - 10/05/23 1429       Check-In   Supervising physician immediately available to respond to emergencies [facility] MD immediately available    Physician(s) Eligha Bridegroom, NP    Location [facility]    Staff Present Raford Pitcher, MS, ACSM-CEP, Exercise Physiologist;Venecia Mehl Gerre Scull, RN, BSN;Randi Reeve BS, ACSM-CEP, Exercise Physiologist;David [place], MS, ACSM-CEP, CCRP, Exercise Physiologist    Virtual Visit No    Medication changes reported     No    Fall or balance concerns reported    No    Tobacco Cessation No Change    Warm-up and Cool-down Performed as group-led instruction    Resistance Training Performed Yes    VAD Patient? No    PAD/SET Patient? No      Pain Assessment   Currently in Pain? No/denies    Multiple Pain Sites No             Capillary Blood Glucose: No results found for this or any previous visit (from the past 24 hour(s)).    Social History   Tobacco Use  Smoking Status Never   Passive exposure: Past  Smokeless Tobacco Never    Goals Met:  Independence with exercise equipment Exercise tolerated well No report of concerns or symptoms today Strength training completed today  Goals Unmet:  Not Applicable  Comments: Service time is from 1313 to 1422    Dr. Mechele Collin is Medical Director for Pulmonary Rehab at [facility].

## 2023-10-07 ENCOUNTER — Encounter (HOSPITAL_COMMUNITY)
Admission: RE | Admit: 2023-10-07 | Discharge: 2023-10-07 | Disposition: A | Discharge status: Home / Self Care | Payer: PPO | Source: Ambulatory Visit | Attending: Pulmonary Disease | Admitting: Pulmonary Disease

## 2023-10-07 DIAGNOSIS — R0602 Shortness of breath: Secondary | ICD-10-CM

## 2023-10-07 NOTE — Progress Notes (Signed)
Daily Session Note  Patient Details  Name: Julie [name] MRN: [id number] Date of Birth: Jun 22, 1941 Referring Provider:   Doristine Devoid Pulmonary Rehab Walk Test from 07/16/2023 in [facility] for Heart, Vascular, & Lung Health  Referring Provider Everardo All       Encounter Date: 10/07/2023  Check In:  Session Check In - 10/07/23 1332       Check-In   Supervising physician immediately available to respond to emergencies [facility] immediately available    Physician(s) Edd Fabian, NP    [facility]    Staff Present Raford Pitcher, MS, ACSM-CEP, Exercise Physiologist;Mary Gerre Scull, RN, BSN;Randi Idelle Crouch BS, ACSM-CEP, Exercise Physiologist;Meeah Totino Katrinka Blazing, RT    Virtual Visit No    Medication changes reported     No    Fall or balance concerns reported    No    Tobacco Cessation No Change    Warm-up and Cool-down Performed as group-led instruction    Resistance Training Performed Yes    VAD Patient? No    PAD/SET Patient? No      Pain Assessment   Currently in Pain? No/denies    Multiple Pain Sites No             Capillary Blood Glucose: No results found for this or any previous visit (from the past 24 hour(s)).    Social History   Tobacco Use  Smoking Status Never   Passive exposure: Past  Smokeless Tobacco Never    Goals Met:  Proper associated with RPD/PD & O2 Sat Independence with exercise equipment Exercise tolerated well No report of concerns or symptoms today Strength training completed today  Goals Unmet:  Not Applicable  Comments: Service time is from 1312 to 1440.    Dr. Mechele Collin is Medical Director for Pulmonary Rehab at [facility].

## 2023-10-12 ENCOUNTER — Encounter (HOSPITAL_COMMUNITY)
Admission: RE | Admit: 2023-10-12 | Discharge: 2023-10-12 | Disposition: A | Discharge status: Home / Self Care | Payer: PPO | Source: Ambulatory Visit | Attending: Pulmonary Disease

## 2023-10-12 VITALS — Wt 130.3 lb

## 2023-10-12 DIAGNOSIS — R0602 Shortness of breath: Secondary | ICD-10-CM

## 2023-10-12 NOTE — Progress Notes (Signed)
Daily Session Note  Patient Details  Name: Julie [name] MRN: [id number] Date of Birth: 1941/10/22 Referring Provider:   Doristine Devoid Pulmonary Rehab Walk Test from 07/16/2023 in [facility] for Heart, Vascular, & Lung Health  Referring Provider Everardo All       Encounter Date: 10/12/2023  Check In:  Session Check In - 10/12/23 1322       Check-In   Supervising physician immediately available to respond to emergencies [facility] MD immediately available    Physician(s) Carlyon Shadow, NP    [facility]    Staff Present Raford Pitcher, MS, ACSM-CEP, Exercise Physiologist;Mary Gerre Scull, RN, BSN;Randi Idelle Crouch BS, ACSM-CEP, Exercise Physiologist;Casey Katrinka Blazing, RT    Virtual Visit No    Medication changes reported     No    Fall or balance concerns reported    No    Tobacco Cessation No Change    Warm-up and Cool-down Performed as group-led instruction    Resistance Training Performed Yes    VAD Patient? No    PAD/SET Patient? No      Pain Assessment   Currently in Pain? No/denies    Multiple Pain Sites No             Capillary Blood Glucose: No results found for this or any previous visit (from the past 24 hour(s)).   Exercise Prescription Changes - 10/12/23 1400       Response to Exercise   Blood Pressure (Admit) 112/70    Blood Pressure (Exercise) 144/76    Blood Pressure (Exit) 116/68    Heart Rate (Admit) 70 bpm    Heart Rate (Exercise) 124 bpm    Heart Rate (Exit) 95 bpm    Oxygen Saturation (Admit) 96 %    Oxygen Saturation (Exercise) 96 %    Oxygen Saturation (Exit) 91 %    Rating of Perceived Exertion (Exercise) 11    Perceived Dyspnea (Exercise) 2    Duration Continue with 30 min of aerobic exercise without signs/symptoms of physical distress.    Intensity THRR unchanged      Progression   Progression Continue to progress workloads to maintain intensity without signs/symptoms of physical distress.       Resistance Training   Training Prescription Yes    Weight blue bands    Reps 10-15    Time 10 Minutes      Recumbant Bike   Level 4    Minutes 15    METs 3.3      Recumbant Elliptical   Level 5    Minutes 15    METs 4.9             Social History   Tobacco Use  Smoking Status Never   Passive exposure: Past  Smokeless Tobacco Never    Goals Met:  Proper associated with RPD/PD & O2 Sat Independence with exercise equipment Exercise tolerated well No report of concerns or symptoms today Strength training completed today  Goals Unmet:  Not Applicable  Comments: Service time is from 1314 to 1437.    Dr. Mechele Collin is Medical Director for Pulmonary Rehab at [facility].

## 2023-10-13 ENCOUNTER — Encounter (HOSPITAL_BASED_OUTPATIENT_CLINIC_OR_DEPARTMENT_OTHER): Payer: Self-pay | Admitting: Pulmonary Disease

## 2023-10-13 ENCOUNTER — Ambulatory Visit (HOSPITAL_BASED_OUTPATIENT_CLINIC_OR_DEPARTMENT_OTHER): Payer: PPO | Admitting: Pulmonary Disease

## 2023-10-13 VITALS — BP 102/72 | HR 71 | Resp 16 | Ht 59.0 in | Wt 129.8 lb

## 2023-10-13 DIAGNOSIS — R0602 Shortness of breath: Secondary | ICD-10-CM

## 2023-10-13 DIAGNOSIS — J42 Unspecified chronic bronchitis: Secondary | ICD-10-CM

## 2023-10-13 LAB — PULMONARY FUNCTION TEST
DL/VA % pred: 104 %
DL/VA: 4.42 ml/min/mmHg/L
DLCO cor % pred: 83 %
DLCO cor: 13.04 ml/min/mmHg
DLCO unc % pred: 83 %
DLCO unc: 13.04 ml/min/mmHg
FEF 25-75 Post: 0.73 L/s
FEF 25-75 Pre: 0.58 L/s
FEF2575-%Change-Post: 26 %
FEF2575-%Pred-Post: 75 %
FEF2575-%Pred-Pre: 59 %
FEV1-%Change-Post: 7 %
FEV1-%Pred-Post: 80 %
FEV1-%Pred-Pre: 75 %
FEV1-Post: 1.11 L
FEV1-Pre: 1.04 L
FEV1FVC-%Change-Post: 0 %
FEV1FVC-%Pred-Pre: 89 %
FEV6-%Change-Post: 6 %
FEV6-%Pred-Post: 96 %
FEV6-%Pred-Pre: 90 %
FEV6-Post: 1.7 L
FEV6-Pre: 1.59 L
FEV6FVC-%Pred-Post: 107 %
FEV6FVC-%Pred-Pre: 107 %
FVC-%Change-Post: 6 %
FVC-%Pred-Post: 89 %
FVC-%Pred-Pre: 84 %
FVC-Post: 1.7 L
FVC-Pre: 1.59 L
Post FEV1/FVC ratio: 66 %
Post FEV6/FVC ratio: 100 %
Pre FEV1/FVC ratio: 65 %
Pre FEV6/FVC Ratio: 100 %
RV % pred: 113 %
RV: 2.49 L
TLC % pred: 96 %
TLC: 4.15 L

## 2023-10-13 MED ORDER — ALBUTEROL SULFATE HFA 108 (90 BASE) MCG/ACT IN AERS: 2.0000 | INHALATION_SPRAY | Freq: Four times a day (QID) | RESPIRATORY_TRACT | 2 refills | Status: AC | PRN

## 2023-10-13 NOTE — Progress Notes (Signed)
Subjective:   PATIENT ID: Julie [name] GENDER: female DOB: Jan 31, 1941, MRN: [id number]  Chief Complaint  Patient presents with   Follow-up    PFT FU. Currently in pulm rehab    Reason for Visit: Follow-up shortness of breath  [name] is a 82 year old female never smoker with HTN, prediabetes, HLD, hypothyroidism, osteoporosis, lumbar scoliosis, insomnia who presents for follow-up shortness of breath.  Initial consult She reports shortness of breath since November 2023, >10 months. Worsens with walking and in the shower or any activity. Rest and sitting down improves it. In October 2023 she was in traveling in [place] for three weeks and felt tired and short of breath with more activity than usual than trip. No acute illness preceding this but has felt her shortness of breath has gradually worsened. Occurs when getting dressed. Before the trip she was not active at baseline due to her back issues/scoliosis but did not feel like her shortness of breath was this bad. Has some nonproductive coughing that requires daily cough drops and worsens with laying down. Denies wheezing. Has gained 20 lbs due to inactivity. Denies history of childhood asthma or chronic bronchitis.  10/13/23 Since our last visit she has been participating in pulmonary rehab and reports that she is learning a lot. Shortness of breath with activity but improved. Denies cough or wheezing.   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   Other bursitis disorders    history   Personal history of colonic polyps    PONV (postoperative nausea and vomiting)    Pre-diabetes    Pure hypercholesterolemia    Spondylolisthesis, cervical region    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    skin -spots on face, forehead and leg removed   SVD (spontaneous vaginal delivery)    x 1   Unspecified disorder of thyroid    Unspecified hypothyroidism      Family History  Problem Relation Age of Onset   Breast cancer Mother    Lung cancer Mother    Stroke Father    Heart attack Father    Diabetes Sister    Emphysema Maternal Aunt    Brain cancer Grandchild    Other Grandchild        PSC-had liver transplant   Ulcerative colitis Daughter    Colon cancer Neg Hx      Social History   Occupational History   Occupation: retired Social worker  Tobacco Use   Smoking status: Never    Passive exposure: Past   Smokeless tobacco: Never  Vaping Use   Vaping status: Never Used  Substance and Sexual Activity   Alcohol use: No   Drug use: No   Sexual activity: Yes    Birth control/protection: Post-menopausal    Allergies  Allergen Reactions   Penicillins Anaphylaxis  REACTION: causes tongue to swell Has patient had a PCN reaction causing immediate rash, facial/tongue/throat swelling, SOB or lightheadedness with hypotension: Yes swelling Has patient had a PCN reaction causing severe rash involving mucus membranes or skin necrosis:  Has patient had a PCN reaction that required hospitalization no Has patient had a PCN reaction occurring within the last 10 years: no If all of the above answers are "NO", then may proceed with Cephalosporin use.    Adhesive [Tape] Other (See Comments)    Please use paper tape   Codeine Nausea Only   Lisinopril     REACTION: wheezing   Other Other (See Comments)    Pt was having knee surgery and was given an epidural, which did not  work.   Percocet [Oxycodone-Acetaminophen] Nausea And Vomiting     Outpatient Medications Prior to Visit  Medication Sig Dispense Refill   atorvastatin (LIPITOR) 20 MG tablet Take 1 tablet (20 mg total) by mouth daily. 90 tablet 3   gabapentin (NEURONTIN) 100 MG capsule Take 100 mg by mouth as needed.     levothyroxine (SYNTHROID) 50 MCG tablet TAKE 1 TABLET(50 MCG) BY MOUTH DAILY 90 tablet 0   losartan (COZAAR) 50 MG tablet Take 1 tablet (50 mg total) by mouth daily. 90 tablet 3   VITAMIN E PO Take by mouth.     albuterol (VENTOLIN HFA) 108 (90 Base) MCG/ACT inhaler INHALE 2 PUFFS INTO THE LUNGS EVERY 6 HOURS AS NEEDED FOR WHEEZING OR SHORTNESS OF BREATH 6.7 g 0   No facility-administered medications prior to visit.    Review of Systems  Constitutional:  Negative for chills, diaphoresis, fever, malaise/fatigue and weight loss.  HENT:  Negative for congestion.   Respiratory:  Positive for shortness of breath. Negative for cough, hemoptysis, sputum production and wheezing.   Cardiovascular:  Negative for chest pain, palpitations and leg swelling.     Objective:   Vitals:   10/13/23 1534  BP: 102/72  Pulse: 71  Resp: 16  SpO2: 96%  Weight: 129 lb 12.8 oz (58.9 kg)  Height: 4\' 11"  (1.499 m)    SpO2: 96 %  Physical Exam: General: Well-appearing, no acute distress HENT: [place], AT Eyes: EOMI, no scleral icterus Respiratory: Clear to auscultation bilaterally.  No crackles, wheezing or rales Cardiovascular: RRR, -M/R/G, no JVD Extremities:-Edema,-tenderness Neuro: AAO x4, CNII-XII grossly intact Psych: Normal mood, normal affect  Data Reviewed:  Imaging: CT Chest 03/04/23 - Tiny scattered nodules bilaterally measuring 2-3 mm. No masses, infiltrate, effusion or pneumothorax.  PFT: 10/13/23 FVC 1.70 (89%) FEV1 1.11 (80%) Ratio 65  TLC 96% DLCO 83% Interpretation: Normal PFTs  Labs: CBC    Component Value Date/Time   WBC 8.2 12/07/2022 1427   RBC 4.22 12/07/2022 1427    HGB 13.0 12/07/2022 1427   HGB 12.0 09/02/2020 0856   HCT 38.1 12/07/2022 1427   HCT 35.4 09/02/2020 0856   PLT 244.0 12/07/2022 1427   PLT 261 09/02/2020 0856   MCV 90.3 12/07/2022 1427   MCV 90 09/02/2020 0856   MCH 30.1 10/31/2021 1745   MCHC 34.1 12/07/2022 1427   RDW 14.0 12/07/2022 1427   RDW 12.8 09/02/2020 0856   LYMPHSABS 1.7 12/07/2022 1427   MONOABS 0.5 12/07/2022 1427   EOSABS 0.1 12/07/2022 1427   BASOSABS 0.0 12/07/2022 1427   Cardiac: Echo 12/30/22 EF 60-65%. No WMA. Grade I DD     Assessment & Plan:   Discussion: 82 year old female never smoker with HTN, prediabetes,  HLD, hypothyroidism, osteoporosis, lumbar scoliosis, insomnia who presents for follow-up for shortness of breath. Pulmonary rehab has improved her respiratory symptoms. Discussed benefit of bronchodilators. Prefers to use SABA.  Shortness of breath Asthmatic bronchitis --Reviewed pulmonary function tests. Normal PFTs. Partial bronchodilator response --START Albuterol every 4-6 hours AS NEEDED for shortness of breath or wheezing --Continue Pulmonary Rehab  Health Maintenance Immunization History  Administered Date(s) Administered   Influenza-Unspecified 09/02/2018   PFIZER(Purple Top)SARS-COV-2 Vaccination 12/07/2019, 01/02/2020   Pneumococcal Conjugate-13 02/24/2016   Pneumococcal Polysaccharide-23 02/11/2015   Tdap 04/23/2017, 05/09/2022   CT Lung Screen - not qualified  No orders of the defined types were placed in this encounter.  Meds ordered this encounter  Medications   albuterol (VENTOLIN HFA) 108 (90 Base) MCG/ACT inhaler    Sig: Inhale 2 puffs into the lungs every 6 (six) hours as needed for wheezing or shortness of breath.    Dispense:  6.7 g    Refill:  2    Return in about 3 months (around 01/11/2024).  I have spent a total time of 31-minutes on the day of the appointment including chart review, data review, collecting history, coordinating care and discussing medical  diagnosis and plan with the patient/family. Past medical history, allergies, medications were reviewed. Pertinent imaging, labs and tests included in this note have been reviewed and interpreted independently by me.  Jidenna Figgs Mechele Collin, MD [facility] 10/13/2023 4:06 PM  Office Number [phone]

## 2023-10-13 NOTE — Patient Instructions (Signed)
Full PFT Performed Today  

## 2023-10-13 NOTE — Patient Instructions (Signed)
Shortness of breath Asthmatic bronchitis --Reviewed pulmonary function tests. Normal PFTs. Partial bronchodilator response --START Albuterol every 4-6 hours AS NEEDED for shortness of breath or wheezing --Continue Pulmonary Rehab

## 2023-10-13 NOTE — Progress Notes (Signed)
Full PFT Performed Today  

## 2023-10-14 ENCOUNTER — Encounter (HOSPITAL_COMMUNITY)
Admission: RE | Admit: 2023-10-14 | Discharge: 2023-10-14 | Disposition: A | Discharge status: Home / Self Care | Payer: PPO | Source: Ambulatory Visit | Attending: Pulmonary Disease | Admitting: Pulmonary Disease

## 2023-10-14 ENCOUNTER — Telehealth (HOSPITAL_COMMUNITY): Payer: Self-pay

## 2023-10-14 DIAGNOSIS — R0602 Shortness of breath: Secondary | ICD-10-CM

## 2023-10-14 NOTE — Telephone Encounter (Signed)
Dr. Everardo All, Can we have an order to increase Anginette's target heart rate to 130 while exercising?

## 2023-10-14 NOTE — Progress Notes (Signed)
Daily Session Note  Patient Details  Name: Julie [name] MRN: [id number] Date of Birth: 17-Oct-1941 Referring Provider:   Doristine Devoid Pulmonary Rehab Walk Test from 07/16/2023 in [facility] for Heart, Vascular, & Lung Health  Referring Provider Everardo All       Encounter Date: 10/14/2023  Check In:  Session Check In - 10/14/23 1324       Check-In   Supervising physician immediately available to respond to emergencies [facility] MD immediately available    Physician(s) Joni Reining, NP    [facility]    Staff Present Raford Pitcher, MS, ACSM-CEP, Exercise Physiologist;Keona Sheffler Gerre Scull, RN, BSN;Randi Idelle Crouch BS, ACSM-CEP, Exercise Physiologist;Casey Katrinka Blazing, RT    Virtual Visit No    Medication changes reported     No    Fall or balance concerns reported    No    Tobacco Cessation No Change    Warm-up and Cool-down Performed as group-led instruction    Resistance Training Performed Yes    VAD Patient? No    PAD/SET Patient? No      Pain Assessment   Currently in Pain? No/denies    Multiple Pain Sites No             Capillary Blood Glucose: No results found for this or any previous visit (from the past 24 hours).    Social History   Tobacco Use  Smoking Status Never   Passive exposure: Past  Smokeless Tobacco Never    Goals Met:  Independence with exercise equipment Exercise tolerated well No report of concerns or symptoms today Strength training completed today  Goals Unmet:  Not Applicable  Comments: Service time is from 1311 to 1435    Dr. Mechele Collin is Medical Director for Pulmonary Rehab at [facility].

## 2023-10-15 NOTE — Telephone Encounter (Signed)
OK to increase target HR to 130.

## 2023-10-19 ENCOUNTER — Encounter (HOSPITAL_COMMUNITY)
Admission: RE | Admit: 2023-10-19 | Discharge: 2023-10-19 | Disposition: A | Discharge status: Home / Self Care | Payer: PPO | Source: Ambulatory Visit | Attending: Pulmonary Disease | Admitting: Pulmonary Disease

## 2023-10-19 DIAGNOSIS — R0602 Shortness of breath: Secondary | ICD-10-CM

## 2023-10-19 NOTE — Progress Notes (Signed)
Daily Session Note  Patient Details  Name: Julie [name] MRN: [id number] Date of Birth: 02-Jun-1941 Referring Provider:   Doristine Devoid Pulmonary Rehab Walk Test from 07/16/2023 in [facility] for Heart, Vascular, & Lung Health  Referring Provider Everardo All       Encounter Date: 10/19/2023  Check In:  Session Check In - 10/19/23 1158       Check-In   Supervising physician immediately available to respond to emergencies [facility] MD immediately available    Physician(s) Bernadene Person, NP    Location [facility]    Staff Present Raford Pitcher, MS, ACSM-CEP, Exercise Physiologist;Jaeven Wanzer Gerre Scull, RN, BSN;Randi Idelle Crouch BS, ACSM-CEP, Exercise Physiologist;Casey Katrinka Blazing, RT    Virtual Visit No    Medication changes reported     No    Fall or balance concerns reported    No    Tobacco Cessation No Change    Warm-up and Cool-down Performed as group-led instruction    Resistance Training Performed Yes    VAD Patient? No    PAD/SET Patient? No      Pain Assessment   Currently in Pain? No/denies    Multiple Pain Sites No             Capillary Blood Glucose: No results found for this or any previous visit (from the past 24 hours).    Social History   Tobacco Use  Smoking Status Never   Passive exposure: Past  Smokeless Tobacco Never    Goals Met:  Independence with exercise equipment Exercise tolerated well No report of concerns or symptoms today Strength training completed today  Goals Unmet:  Not Applicable  Comments: Service time is from 1021 to 1141    Dr. Mechele Collin is Medical Director for Pulmonary Rehab at [facility].

## 2023-10-21 ENCOUNTER — Encounter (HOSPITAL_COMMUNITY)
Admission: RE | Admit: 2023-10-21 | Discharge: 2023-10-21 | Disposition: A | Discharge status: Home / Self Care | Payer: PPO | Source: Ambulatory Visit | Attending: Pulmonary Disease | Admitting: Pulmonary Disease

## 2023-10-21 VITALS — Wt 132.9 lb

## 2023-10-21 DIAGNOSIS — R0602 Shortness of breath: Secondary | ICD-10-CM

## 2023-10-21 NOTE — Progress Notes (Signed)
Daily Session Note  Patient Details  Name: Julie [name] MRN: [id number] Date of Birth: Mar 03, 1941 Referring Provider:   Doristine Devoid Pulmonary Rehab Walk Test from 07/16/2023 in [facility] for Heart, Vascular, & Lung Health  Referring Provider Everardo All       Encounter Date: 10/21/2023  Check In:  Session Check In - 10/21/23 1117       Check-In   Supervising physician immediately available to respond to emergencies [facility] MD immediately available    Physician(s) Neila Gear, NP    Location [facility]    Staff Present Raford Pitcher, MS, ACSM-CEP, Exercise Physiologist;Mary Gerre Scull, RN, BSN;Randi Idelle Crouch BS, ACSM-CEP, Exercise Physiologist;Casey Katrinka Blazing, RT    Virtual Visit No    Medication changes reported     No    Fall or balance concerns reported    No    Tobacco Cessation No Change    Warm-up and Cool-down Performed as group-led instruction    Resistance Training Performed Yes    VAD Patient? No    PAD/SET Patient? No      Pain Assessment   Currently in Pain? No/denies    Multiple Pain Sites No             Capillary Blood Glucose: No results found for this or any previous visit (from the past 24 hours).    Social History   Tobacco Use  Smoking Status Never   Passive exposure: Past  Smokeless Tobacco Never    Goals Met:  Proper associated with RPD/PD & O2 Sat Exercise tolerated well No report of concerns or symptoms today Strength training completed today  Goals Unmet:  Not Applicable  Comments: Service time is from 1017 to 1145.    Dr. Mechele Collin is Medical Director for Pulmonary Rehab at [facility].

## 2023-10-26 ENCOUNTER — Encounter (HOSPITAL_COMMUNITY): Payer: PPO

## 2023-10-26 NOTE — Progress Notes (Signed)
Pulmonary Individual Treatment Plan  Patient Details  Name: Julie [name] MRN: [id number] Date of Birth: [birth year] Referring Provider:   Doristine Devoid Pulmonary Rehab Walk Test from 07/16/2023 in [facility]  Referring Provider Everardo All       Initial Encounter Date:  Flowsheet Row Pulmonary Rehab Walk Test from 07/16/2023 in [facility]  Date 07/16/23       Visit Diagnosis: Shortness of breath  Patient's Home Medications on Admission:   Current Outpatient Medications:    albuterol (VENTOLIN HFA) 108 (90 Base) MCG/ACT inhaler, Inhale 2 puffs into the lungs every 6 (six) hours as needed for wheezing or shortness of breath., Disp: 6.7 g, Rfl: 2   atorvastatin (LIPITOR) 20 MG tablet, Take 1 tablet (20 mg total) by mouth daily., Disp: 90 tablet, Rfl: 3   gabapentin (NEURONTIN) 100 MG capsule, Take 100 mg by mouth as needed., Disp: , Rfl:    levothyroxine (SYNTHROID) 50 MCG tablet, TAKE 1 TABLET(50 MCG) BY MOUTH DAILY, Disp: 90 tablet, Rfl: 0   losartan (COZAAR) 50 MG tablet, Take 1 tablet (50 mg total) by mouth daily., Disp: 90 tablet, Rfl: 3   VITAMIN E PO, Take by mouth., Disp: , Rfl:   Past Medical History: 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   Other bursitis disorders    history   Personal history of colonic polyps    PONV (postoperative nausea and vomiting)    Pre-diabetes     Pure hypercholesterolemia    Spondylolisthesis, cervical region    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    skin -spots on face, forehead and leg removed   SVD (spontaneous vaginal delivery)    x 1   Unspecified disorder of thyroid    Unspecified hypothyroidism     Tobacco Use: Social History   Tobacco Use  Smoking Status Never   Passive exposure: Past  Smokeless Tobacco Never    Labs: Review Flowsheet  More data exists      Latest Ref Rng & Units 01/16/2019 12/15/2019 09/02/2020 09/22/2021 02/09/2023  Labs for ITP Cardiac and Pulmonary Rehab  Cholestrol 100 - 199 mg/dL - - 818  - 299   LDL (calc) 0 - 99 mg/dL - - 371  - 696   HDL-C >39 mg/dL - - 49  - 51   Trlycerides 0 - 149 mg/dL - - 76  - 789   Hemoglobin A1c 4.0 - 5.6 % 5.6  6.0  - 5.6  -    Capillary Blood Glucose: Lab Results  Component Value Date   GLUCAP 99 01/23/2017   GLUCAP 97 01/22/2017   GLUCAP 166 (H) 12/30/2012     Pulmonary Assessment Scores:  Pulmonary Assessment Scores     Row Name 07/16/23 1341  ADL UCSD   ADL Phase Entry     SOB Score total 60       CAT Score   CAT Score 19       mMRC Score   mMRC Score 4             UCSD: Self-administered rating of dyspnea associated with activities of daily living (ADLs) 6-point scale (0 = "not at all" to 5 = "maximal or unable to do because of breathlessness")  Scoring Scores range from 0 to 120.  Minimally important difference is 5 units  CAT: CAT can identify the health impairment of COPD patients and is better correlated with disease progression.  CAT has a scoring range of zero to 40. The CAT score is classified into four groups of low (less than 10), medium (10 - 20), high (21-30) and very high (31-40) based on the impact level of disease on health status. A CAT score over 10 suggests significant symptoms.  A worsening CAT score could be explained by an exacerbation, poor medication adherence, poor inhaler technique, or  progression of COPD or comorbid conditions.  CAT MCID is 2 points  mMRC: mMRC (Modified Medical Research Council) Dyspnea Scale is used to assess the degree of baseline functional disability in patients of respiratory disease due to dyspnea. No minimal important difference is established. A decrease in score of 1 point or greater is considered a positive change.   Pulmonary Function Assessment:  Pulmonary Function Assessment - 07/16/23 1311       Breath   Bilateral Breath Sounds Clear    Shortness of Breath Yes;Limiting activity             Exercise Target Goals: Exercise Program Goal: Individual exercise prescription set using results from initial 6 min walk test and THRR while considering  patient's activity barriers and safety.   Exercise Prescription Goal: Initial exercise prescription builds to 30-45 minutes a day of aerobic activity, 2-3 days per week.  Home exercise guidelines will be given to patient during program as part of exercise prescription that the participant will acknowledge.  Activity Barriers & Risk Stratification:  Activity Barriers & Cardiac Risk Stratification - 07/16/23 1310       Activity Barriers & Cardiac Risk Stratification   Activity Barriers History of Falls;Deconditioning;Muscular Weakness;Shortness of Breath;Balance Concerns;Back Problems             6 Minute Walk:  6 Minute Walk     Row Name 07/16/23 1400         6 Minute Walk   Phase Initial     Distance 823 feet     Walk Time 6 minutes     # of Rest Breaks 0     MPH 1.56     METS 1.2     RPE 11     Perceived Dyspnea  1     VO2 Peak 4.19     Resting HR 81 bpm     Resting BP 130/72     Resting Oxygen Saturation  94 %     Exercise Oxygen Saturation  during 6 min walk 92 %     Max Ex. HR 105 bpm     Max Ex. BP 132/82     2 Minute Post BP 128/80       Interval HR   1 Minute HR 95     2 Minute HR 107     3 Minute HR 103     4 Minute  HR 105     5 Minute HR 101     6  Minute HR 100     2 Minute Post HR 86     Interval Heart Rate? Yes       Interval Oxygen   Interval Oxygen? Yes     Baseline Oxygen Saturation % 94 %     1 Minute Oxygen Saturation % 93 %     1 Minute Liters of Oxygen 0 L     2 Minute Oxygen Saturation % 94 %     2 Minute Liters of Oxygen 0 L     3 Minute Oxygen Saturation % 97 %     3 Minute Liters of Oxygen 0 L     4 Minute Oxygen Saturation % 92 %     4 Minute Liters of Oxygen 0 L     5 Minute Oxygen Saturation % 94 %     5 Minute Liters of Oxygen 0 L     6 Minute Oxygen Saturation % 95 %     6 Minute Liters of Oxygen 0 L     2 Minute Post Oxygen Saturation % 97 %     2 Minute Post Liters of Oxygen 0 L              Oxygen Initial Assessment:  Oxygen Initial Assessment - 07/16/23 1310       Home Oxygen   Home Oxygen Device None    Sleep Oxygen Prescription None    Home Exercise Oxygen Prescription None    Home Resting Oxygen Prescription None      Initial 6 min Walk   Oxygen Used None      Program Oxygen Prescription   Program Oxygen Prescription None      Intervention   Short Term Goals To learn and understand importance of maintaining oxygen saturations>88%;To learn and demonstrate proper use of respiratory medications;To learn and understand importance of monitoring SPO2 with pulse oximeter and demonstrate accurate use of the pulse oximeter.;To learn and demonstrate proper pursed lip breathing techniques or other breathing techniques.     Long  Term Goals Maintenance of O2 saturations>88%;Compliance with respiratory medication;Demonstrates proper use of MDI's;Exhibits proper breathing techniques, such as pursed lip breathing or other method taught during program session;Verbalizes importance of monitoring SPO2 with pulse oximeter and return demonstration             Oxygen Re-Evaluation:  Oxygen Re-Evaluation     Row Name 07/27/23 0934 08/27/23 1130 09/21/23 0820 10/14/23 0854       Program Oxygen  Prescription   Program Oxygen Prescription None None None None      Home Oxygen   Home Oxygen Device None None None None    Sleep Oxygen Prescription None None None None    Home Exercise Oxygen Prescription None None None None    Home Resting Oxygen Prescription None None None None      Goals/Expected Outcomes   Short Term Goals To learn and understand importance of maintaining oxygen saturations>88%;To learn and demonstrate proper use of respiratory medications;To learn and understand importance of monitoring SPO2 with pulse oximeter and demonstrate accurate use of the pulse oximeter.;To learn and demonstrate proper pursed lip breathing techniques or other breathing techniques.  To learn and understand importance of maintaining oxygen saturations>88%;To learn and demonstrate proper use of respiratory medications;To learn and understand importance of monitoring SPO2 with pulse oximeter and demonstrate accurate use of the pulse oximeter.;To learn and  demonstrate proper pursed lip breathing techniques or other breathing techniques.  To learn and understand importance of maintaining oxygen saturations>88%;To learn and demonstrate proper use of respiratory medications;To learn and understand importance of monitoring SPO2 with pulse oximeter and demonstrate accurate use of the pulse oximeter.;To learn and demonstrate proper pursed lip breathing techniques or other breathing techniques.  To learn and understand importance of maintaining oxygen saturations>88%;To learn and demonstrate proper use of respiratory medications;To learn and understand importance of monitoring SPO2 with pulse oximeter and demonstrate accurate use of the pulse oximeter.;To learn and demonstrate proper pursed lip breathing techniques or other breathing techniques.     Long  Term Goals Maintenance of O2 saturations>88%;Compliance with respiratory medication;Demonstrates proper use of MDI's;Exhibits proper breathing techniques, such as  pursed lip breathing or other method taught during program session;Verbalizes importance of monitoring SPO2 with pulse oximeter and return demonstration Maintenance of O2 saturations>88%;Compliance with respiratory medication;Demonstrates proper use of MDI's;Exhibits proper breathing techniques, such as pursed lip breathing or other method taught during program session;Verbalizes importance of monitoring SPO2 with pulse oximeter and return demonstration Maintenance of O2 saturations>88%;Compliance with respiratory medication;Demonstrates proper use of MDI's;Exhibits proper breathing techniques, such as pursed lip breathing or other method taught during program session;Verbalizes importance of monitoring SPO2 with pulse oximeter and return demonstration Maintenance of O2 saturations>88%;Compliance with respiratory medication;Demonstrates proper use of MDI's;Exhibits proper breathing techniques, such as pursed lip breathing or other method taught during program session;Verbalizes importance of monitoring SPO2 with pulse oximeter and return demonstration    Goals/Expected Outcomes Compliance and understanding of oxygen saturation monitoring and breathing techniques to decrease shortness of breath Compliance and understanding of oxygen saturation monitoring and breathing techniques to decrease shortness of breath Compliance and understanding of oxygen saturation monitoring and breathing techniques to decrease shortness of breath Compliance and understanding of oxygen saturation monitoring and breathing techniques to decrease shortness of breath             Oxygen Discharge (Final Oxygen Re-Evaluation):  Oxygen Re-Evaluation - 10/14/23 0854       Program Oxygen Prescription   Program Oxygen Prescription None      Home Oxygen   Home Oxygen Device None    Sleep Oxygen Prescription None    Home Exercise Oxygen Prescription None    Home Resting Oxygen Prescription None      Goals/Expected Outcomes    Short Term Goals To learn and understand importance of maintaining oxygen saturations>88%;To learn and demonstrate proper use of respiratory medications;To learn and understand importance of monitoring SPO2 with pulse oximeter and demonstrate accurate use of the pulse oximeter.;To learn and demonstrate proper pursed lip breathing techniques or other breathing techniques.     Long  Term Goals Maintenance of O2 saturations>88%;Compliance with respiratory medication;Demonstrates proper use of MDI's;Exhibits proper breathing techniques, such as pursed lip breathing or other method taught during program session;Verbalizes importance of monitoring SPO2 with pulse oximeter and return demonstration    Goals/Expected Outcomes Compliance and understanding of oxygen saturation monitoring and breathing techniques to decrease shortness of breath             Initial Exercise Prescription:  Initial Exercise Prescription - 07/16/23 1400       Date of Initial Exercise RX and Referring Provider   Date 07/16/23    Referring Provider Everardo All    Expected Discharge Date 10/14/23      Recumbant Bike   Level 1    Minutes 15    METs 1.5  Recumbant Elliptical   Level 1    Minutes 15    METs 2      Prescription Details   Frequency (times per week) 2    Duration Progress to 30 minutes of continuous aerobic without signs/symptoms of physical distress      Intensity   THRR 40-80% of Max Heartrate 55-110    Ratings of Perceived Exertion 11-13    Perceived Dyspnea 0-4      Progression   Progression Continue to progress workloads to maintain intensity without signs/symptoms of physical distress.      Resistance Training   Training Prescription Yes    Weight red bands    Reps 10-15             Perform Capillary Blood Glucose checks as needed.  Exercise Prescription Changes:   Exercise Prescription Changes     Row Name 08/03/23 1500 08/17/23 1236 08/26/23 1500 08/31/23 1500 09/07/23 1237      Response to Exercise   Blood Pressure (Admit) 122/70 122/74 -- 114/76 142/72  running late, leaving for [place] today after class   Blood Pressure (Exercise) 158/76 -- -- 144/72 --   Blood Pressure (Exit) 102/62 116/72 -- 120/68 104/68   Heart Rate (Admit) 85 bpm 98 bpm -- 75 bpm 88 bpm   Heart Rate (Exercise) 103 bpm 119 bpm -- 120 bpm 118 bpm   Heart Rate (Exit) 84 bpm 102 bpm -- 90 bpm 94 bpm   Oxygen Saturation (Admit) 93 % 95 % -- 96 % 95 %   Oxygen Saturation (Exercise) 94 % 92 % -- 93 % 95 %   Oxygen Saturation (Exit) 93 % 96 % -- 95 % 95 %   Rating of Perceived Exertion (Exercise) 13 11 -- 12 12   Perceived Dyspnea (Exercise) 3 2 -- 2 2   Duration Continue with 30 min of aerobic exercise without signs/symptoms of physical distress. Continue with 30 min of aerobic exercise without signs/symptoms of physical distress. -- Continue with 30 min of aerobic exercise without signs/symptoms of physical distress. Continue with 30 min of aerobic exercise without signs/symptoms of physical distress.   Intensity THRR unchanged THRR unchanged -- THRR unchanged THRR unchanged     Progression   Progression Continue to progress workloads to maintain intensity without signs/symptoms of physical distress. Continue to progress workloads to maintain intensity without signs/symptoms of physical distress. -- Continue to progress workloads to maintain intensity without signs/symptoms of physical distress. Continue to progress workloads to maintain intensity without signs/symptoms of physical distress.   Average METs 3.7 -- -- -- --     Paramedic Prescription Yes Yes -- Yes Yes   Weight blue bands blue bands -- blue bands blue bands   Reps 10-15 10-15 -- 10-15 10-15   Time 10 Minutes 10 Minutes -- 10 Minutes 10 Minutes     Recumbant Bike   Level 2 2 -- 3 3   RPM 60 52 -- 51 51   Watts -- -- -- 26 25   Minutes 15 15 -- 15 15   METs 2.6 2.2 -- 2.9 2.8     Recumbant Elliptical    Level 2 3 -- 3 4   RPM -- -- -- 58 --   Watts -- -- -- 77 --   Minutes 15 15 -- 15 15   METs 3.7 3.8 -- 4.8 4.5     Home Exercise Plan   Plans to continue exercise at -- --  Home (comment)  walking at [employer] -- --   Frequency -- -- Add 2 additional days to program exercise sessions. -- --   Initial Home Exercises Provided -- -- 08/26/23 -- --    Row Name 09/28/23 1500 10/12/23 1400           Response to Exercise   Blood Pressure (Admit) 132/64 112/70      Blood Pressure (Exercise) 130/70 144/76      Blood Pressure (Exit) 104/64 116/68      Heart Rate (Admit) 69 bpm 70 bpm      Heart Rate (Exercise) 110 bpm 124 bpm      Heart Rate (Exit) 85 bpm 95 bpm      Oxygen Saturation (Admit) 95 % 96 %      Oxygen Saturation (Exercise) 94 % 96 %      Oxygen Saturation (Exit) 94 % 91 %      Rating of Perceived Exertion (Exercise) 11 11      Perceived Dyspnea (Exercise) 3 2      Duration Continue with 30 min of aerobic exercise without signs/symptoms of physical distress. Continue with 30 min of aerobic exercise without signs/symptoms of physical distress.      Intensity THRR unchanged THRR unchanged        Progression   Progression Continue to progress workloads to maintain intensity without signs/symptoms of physical distress. Continue to progress workloads to maintain intensity without signs/symptoms of physical distress.        Resistance Training   Training Prescription Yes Yes      Weight blue bands blue bands      Reps 10-15 10-15      Time 10 Minutes 10 Minutes        Recumbant Bike   Level 3 4      Minutes 15 15      METs 2.9 3.3        Recumbant Elliptical   Level 4 5      Minutes 15 15      METs 3.4 4.9               Exercise Comments:   Exercise Comments     Row Name 07/22/23 1509 08/26/23 1534         Exercise Comments Pt completed first day of group exercise. She exercised on recumbent bike for 15 min, level 1, METs 1.9. She then exercised on the  recumbent elliptical, level 1, METs 3.1 for 15 min. Pt tolerated well. Able to perform warm up and cool down exercises with chair support. Discussed METs with good restrictions. Discussed with pt home exercise plan. She is not currently exercising at home but wants to start so she can get ready for a trip to [place] in a couple weeks. Discussed starting to walk at [facility] hardware or other large store using a cane. Encouraged starting with 15 min and building up to 30 min, 1-2 nonrehab days a week. She is agreeable.               Exercise Goals and Review:   Exercise Goals     Row Name 07/16/23 1310             Exercise Goals   Increase Physical Activity Yes       Intervention Provide advice, education, support and counseling about physical activity/exercise needs.;Develop an individualized exercise prescription for aerobic and resistive training based on initial evaluation findings, risk stratification, comorbidities and participant's personal  goals.       Expected Outcomes Short Term: Attend rehab on a regular basis to increase amount of physical activity.;Long Term: Exercising regularly at least 3-5 days a week.;Long Term: Add in home exercise to make exercise part of routine and to increase amount of physical activity.       Increase Strength and Stamina Yes       Intervention Provide advice, education, support and counseling about physical activity/exercise needs.;Develop an individualized exercise prescription for aerobic and resistive training based on initial evaluation findings, risk stratification, comorbidities and participant's personal goals.       Expected Outcomes Short Term: Increase workloads from initial exercise prescription for resistance, speed, and METs.;Short Term: Perform resistance training exercises routinely during rehab and add in resistance training at home;Long Term: Improve cardiorespiratory fitness, muscular endurance and strength as measured by increased METs and  functional capacity ( )       Able to understand and use rate of perceived exertion (RPE) scale Yes       Intervention Provide education and explanation on how to use RPE scale       Expected Outcomes Short Term: Able to use RPE daily in rehab to express subjective intensity level;Long Term:  Able to use RPE to guide intensity level when exercising independently       Able to understand and use Dyspnea scale Yes       Intervention Provide education and explanation on how to use Dyspnea scale       Expected Outcomes Short Term: Able to use Dyspnea scale daily in rehab to express subjective sense of shortness of breath during exertion;Long Term: Able to use Dyspnea scale to guide intensity level when exercising independently       Knowledge and understanding of Target Heart Rate Range (THRR) Yes       Intervention Provide education and explanation of THRR including how the numbers were predicted and where they are located for reference       Expected Outcomes Short Term: Able to state/look up THRR;Short Term: Able to use daily as guideline for intensity in rehab;Long Term: Able to use THRR to govern intensity when exercising independently       Understanding of Exercise Prescription Yes       Intervention Provide education, explanation, and written materials on patient's individual exercise prescription       Expected Outcomes Short Term: Able to explain program exercise prescription;Long Term: Able to explain home exercise prescription to exercise independently                Exercise Goals Re-Evaluation :  Exercise Goals Re-Evaluation     Row Name 07/27/23 0932 08/27/23 1125 09/21/23 0817 10/14/23 0851       Exercise Goal Re-Evaluation   Exercise Goals Review Increase Physical Activity;Able to understand and use Dyspnea scale;Understanding of Exercise Prescription;Increase Strength and Stamina;Knowledge and understanding of Target Heart Rate Range (THRR);Able to understand and use rate  of perceived exertion (RPE) scale Increase Physical Activity;Able to understand and use Dyspnea scale;Understanding of Exercise Prescription;Increase Strength and Stamina;Knowledge and understanding of Target Heart Rate Range (THRR);Able to understand and use rate of perceived exertion (RPE) scale Increase Physical Activity;Able to understand and use Dyspnea scale;Understanding of Exercise Prescription;Increase Strength and Stamina;Knowledge and understanding of Target Heart Rate Range (THRR);Able to understand and use rate of perceived exertion (RPE) scale Increase Physical Activity;Able to understand and use Dyspnea scale;Understanding of Exercise Prescription;Increase Strength and Stamina;Knowledge and understanding of Target Heart  Rate Range (THRR);Able to understand and use rate of perceived exertion (RPE) scale    Comments Pt has completed one exercise session. She exercised on recumbent bike for 15 min, level 1, METs 1.9. She then exercised on the recumbent elliptical, level 1, METs 3.1 for 15 min. Pt tolerated well. Able to perform warm up and cool down exercises with chair support. Will progress pt as tolerated. Pt has completed 8 exercise session, missing 1. She is exercising on recumbent bike for 15 min, level 3, METs 2.7. She then is exercising on the recumbent elliptical, level 3, METs 3.3 for 15 min. She has progressed well. She is going to [place] in a few weeks and wants to increase her walking ability therefore attempted to walk on the treadmill the last session. However she felt the treadmill too fast at 1.0 mph and did not feel safe. Therefore discussed home exercise and walking at home to increase her ability in preparation for her trip. She is no longer requiring chair support for cool down exercises but is still on red bands. Will continue to progress. Pt has completed 13 exercise sessions but has been on vacation for the last 3 sessions and she will miss 2 more. She is exercising on recumbent  bike for 15 min, level 3, METs 2.8. She then is exercising on the recumbent elliptical, level 4, METs 4.5 for 15 min. She has progressed well, pushing herself to prepare for her trip to [place] that included significant ambulation. She is still using red bands. Will progress as tolerated when she returns. Pt has completed 15 exercise sessions missing 3 weeks recently due to vacation. She returned from vacation without setbacks and is continuing to progress in class. She is exercising on recumbent bike for 15 min, level 4, METs 3.3. She then is exercising on the recumbent elliptical, level 5, METs 4.9 for 15 min. She has progressed now to blue bands. Will progress as tolerated, she is scheduled for graduation 1/9.    Expected Outcomes Through exercise at rehab and home, the patient will decrease shortness of breath with daily activities and feel confident in carrying out an exercise regimen at home Through exercise at rehab and home, the patient will decrease shortness of breath with daily activities and feel confident in carrying out an exercise regimen at home Through exercise at rehab and home, the patient will decrease shortness of breath with daily activities and feel confident in carrying out an exercise regimen at home Through exercise at rehab and home, the patient will decrease shortness of breath with daily activities and feel confident in carrying out an exercise regimen at home             Discharge Exercise Prescription (Final Exercise Prescription Changes):  Exercise Prescription Changes - 10/12/23 1400       Response to Exercise   Blood Pressure (Admit) 112/70    Blood Pressure (Exercise) 144/76    Blood Pressure (Exit) 116/68    Heart Rate (Admit) 70 bpm    Heart Rate (Exercise) 124 bpm    Heart Rate (Exit) 95 bpm    Oxygen Saturation (Admit) 96 %    Oxygen Saturation (Exercise) 96 %    Oxygen Saturation (Exit) 91 %    Rating of Perceived Exertion (Exercise) 11    Perceived  Dyspnea (Exercise) 2    Duration Continue with 30 min of aerobic exercise without signs/symptoms of physical distress.    Intensity THRR unchanged  Progression   Progression Continue to progress workloads to maintain intensity without signs/symptoms of physical distress.      Resistance Training   Training Prescription Yes    Weight blue bands    Reps 10-15    Time 10 Minutes      Recumbant Bike   Level 4    Minutes 15    METs 3.3      Recumbant Elliptical   Level 5    Minutes 15    METs 4.9             Nutrition:  Target Goals: Understanding of nutrition guidelines, daily intake of sodium 1500mg , cholesterol 200mg , calories 30% from fat and 7% or less from saturated fats, daily to have 5 or more servings of fruits and vegetables.  Biometrics:  Pre Biometrics - 07/16/23 1305       Pre Biometrics   Grip Strength 10 kg              Nutrition Therapy Plan and Nutrition Goals:  Nutrition Therapy & Goals - 09/23/23 1158       Nutrition Therapy   Diet Heart Healthy Diet    Drug/Food Interactions Statins/Certain Fruits      Personal Nutrition Goals   Nutrition Goal Patient to improve diet quality by using the plate method as a guide for meal planning to include lean protein/plant protein, fruits, vegetables, whole grains, nonfat dairy as part of a well-balanced diet.   goal in progress.   Personal Goal #2 Patient to identify strategies for weight loss of 0.5-2.0# per week.   goal in progress.   Comments Goal in progress. Akeshia has not attended pulmonary rehab since 11/5 due to vacation. Ameia reports motivation to lose weight with goal weight of 100#.  She reports craving sweets/carbohydrates and over-snacking at night. She is down 3.3# since starting with our program. Her lipid panel remains elevated. Patient will benefit from participation in pulmonary rehab for nutrition, exercise, and lifestyle modification.      Intervention Plan   Intervention  Prescribe, educate and counsel regarding individualized specific dietary modifications aiming towards targeted core components such as weight, hypertension, lipid management, diabetes, heart failure and other comorbidities.;Nutrition handout(s) given to patient.    Expected Outcomes Short Term Goal: Understand basic principles of dietary content, such as calories, fat, sodium, cholesterol and nutrients.;Long Term Goal: Adherence to prescribed nutrition plan.             Nutrition Assessments:  Nutrition Assessments - 07/29/23 0949       Rate Your Plate Scores   Pre Score 54            MEDIFICTS Score Key: >=70 Need to make dietary changes  40-70 Heart Healthy Diet <= 40 Therapeutic Level Cholesterol Diet  Flowsheet Row PULMONARY REHAB OTHER RESPIRATORY from 07/22/2023 in [facility]  Picture Your Plate Total Score on Admission 54      Picture Your Plate Scores: <40 Unhealthy dietary pattern with much room for improvement. 41-50 Dietary pattern unlikely to meet recommendations for good health and room for improvement. 51-60 More healthful dietary pattern, with some room for improvement.  >60 Healthy dietary pattern, although there may be some specific behaviors that could be improved.    Nutrition Goals Re-Evaluation:  Nutrition Goals Re-Evaluation     Row Name 07/30/23 1434 08/26/23 1454 09/23/23 1158         Goals   Current  Weight 134 lb 14.7 oz (61.2 kg) 130 lb 15.3 oz (59.4 kg) 131 lb 9.8 oz (59.7 kg)     Comment LDL 168, cholesterol 241 no new labs; most recent labs  LDL 168, cholesterol 241 (lipitor) no new labs; most recent labs LDL 168, cholesterol 241 (lipitor)     Expected Outcome Nadin reports motivation to lose weight with goal weight of 100#. She reports craving sweets/carbohydrates and over-snacking at night. Her lipid panel remains elevated. Patient will benefit from participation in pulmonary  rehab for nutrition, exercise, and lifestyle modification. Goal in progress. Branwen reports motivation to lose weight with goal weight of 100#. She reports craving sweets/carbohydrates and over-snacking at night. She is down 4# since starting with our program. Her lipid panel remains elevated. Patient will benefit from participation in pulmonary rehab for nutrition, exercise, and lifestyle modification. Goal in progress. Kamrey has not attended pulmonary rehab since 11/5 due to vacation. Sharada reports motivation to lose weight with goal weight of 100#. She reports craving sweets/carbohydrates and over-snacking at night. She is down 3.3# since starting with our program. Her lipid panel remains elevated. Patient will benefit from participation in pulmonary rehab for nutrition, exercise, and lifestyle modification.              Nutrition Goals Discharge (Final Nutrition Goals Re-Evaluation):  Nutrition Goals Re-Evaluation - 09/23/23 1158       Goals   Current Weight 131 lb 9.8 oz (59.7 kg)    Comment no new labs; most recent labs LDL 168, cholesterol 241 (lipitor)    Expected Outcome Goal in progress. Jasmen has not attended pulmonary rehab since 11/5 due to vacation. Mauren reports motivation to lose weight with goal weight of 100#. She reports craving sweets/carbohydrates and over-snacking at night. She is down 3.3# since starting with our program. Her lipid panel remains elevated. Patient will benefit from participation in pulmonary rehab for nutrition, exercise, and lifestyle modification.             Psychosocial: Target Goals: Acknowledge presence or absence of significant depression and/or stress, maximize coping skills, provide positive support system. Participant is able to verbalize types and ability to use techniques and skills needed for reducing stress and depression.  Initial Review & Psychosocial Screening:  Initial Psych Review & Screening - 07/16/23 1312       Initial Review    Current issues with Current Sleep Concerns;Current Stress Concerns    Source of Stress Concerns Unable to participate in former interests or hobbies      Family Dynamics   Good Support System? Yes    Comments Supportive husband      Barriers   Psychosocial barriers to participate in program The patient should benefit from training in stress management and relaxation.      Screening Interventions   Interventions Encouraged to exercise;Provide feedback about the scores to participant    Expected Outcomes Short Term goal: Utilizing psychosocial counselor, staff and physician to assist with identification of specific Stressors or current issues interfering with healing process. Setting desired goal for each stressor or current issue identified.;Long Term Goal: Stressors or current issues are controlled or eliminated.;Short Term goal: Identification and review with participant of any Quality of Life or Depression concerns found by scoring the questionnaire.;Long Term goal: The participant improves quality of Life and PHQ9 Scores as seen by post scores and/or verbalization of changes             Quality of Life Scores:  Scores  of 19 and below usually indicate a poorer quality of life in these areas.  A difference of  2-3 points is a clinically meaningful difference.  A difference of 2-3 points in the total score of the Quality of Life Index has been associated with significant improvement in overall quality of life, self-image, physical symptoms, and general health in studies assessing change in quality of life.  PHQ-9: Review Flowsheet  More data exists      07/16/2023 12/29/2022 12/07/2022 08/28/2022 09/30/2021  Depression screen PHQ 2/9  Decreased Interest 0 0 0 0 0  Down, Depressed, Hopeless 0 0 0 0 0  PHQ - 2 Score 0 0 0 0 0  Altered sleeping 2 3 2  - -  Tired, decreased energy 1 2 2  - -  Change in appetite 1 0 1 - -  Feeling bad or failure about yourself  0 0 0 - -  Trouble  concentrating 0 0 0 - -  Moving slowly or fidgety/restless 0 0 0 - -  Suicidal thoughts 0 0 0 - -  PHQ-9 Score 4 5 5  - -  Difficult doing work/chores Somewhat difficult Somewhat difficult Not difficult at all - -   Interpretation of Total Score  Total Score Depression Severity:  1-4 = Minimal depression, 5-9 = Mild depression, 10-14 = Moderate depression, 15-19 = Moderately severe depression, 20-27 = Severe depression   Psychosocial Evaluation and Intervention:  Psychosocial Evaluation - 07/16/23 1316       Psychosocial Evaluation & Interventions   Interventions Encouraged to exercise with the program and follow exercise prescription    Comments Abrie is not sleeping well. She has tried Rx meds in the past but couldn't tolerate. She is hoping exercise will help with sleep. Daysi is also discouraged that she doesn't have enough energy to walk far distances. 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    Expected Outcomes For Katye to particiapte in [place] rehab with decreased stress related to her lung disease and more quality restful sleep.    Continue Psychosocial Services  No Follow up required             Psychosocial Re-Evaluation:  Psychosocial Re-Evaluation     Row Name 07/28/23 1128 08/27/23 1320 09/21/23 0854 10/20/23 0952       Psychosocial Re-Evaluation   Current issues with Current Sleep Concerns;Current Stress Concerns Current Sleep Concerns;Current Stress Concerns Current Sleep Concerns;Current Stress Concerns Current Sleep Concerns    Comments No changes since orientation. Priyanka has completed 1 session. Lamika still endorses concern about her sleep habits. We have encouraged Harriett Sine to implement a sleep routine and stick to it, put up electronics at least an hour before bed, and go to bed around the same time each night. She stated that she does get more restful sleep since starting PR. She is both  excited and concerned about her vacation coming up, in [place]. She states there will be a lot of walking and she's not sure if she is going to be able to keep up with her family. We have started getting Tomieka ready to go on the trip with increased walking during rehab. She is also walking at home. We have encouraged Tyria that while she's on vacation to take breaks and listen to her body. She declines any psychosocial needs at this time. Soliel has not been to [employer] since 11/5. She is currently on vacation  to [place] and [place]. She was concerned about her trip, with all the walking she would be doing, and if she could keep up with her family. Staff gave Harriett Sine encouragement since she has made great progress so far in the program. We also educated on self-pacing, listening to her body, and performing purse lipped breathing. Alaycia denies any psy/soc needs during her monthly psychosocial evaluation. She thoroughly enjoyed her vacation to [place] and [place]. She was able to walk without much difficulty. She stated she paced herself and took breaks. Toshi stated that if she wasn't in Pulm Rehab she would've never been able to go on vacation and do all the walking it took.    Expected Outcomes For Walda to continue to attend pulmonary rehab and to have a positive outlook and good coping skills to manage her stress. To get better quality of sleep. For Sheva to continue to attend pulmonary rehab and to have a positive outlook and good coping skills to manage her stress. To get better quality of sleep. For Biancca to continue to attend pulmonary rehab and to have a positive outlook and good coping skills to manage her stress. For Vylette to continue to attend pulmonary rehab and to have a positive outlook and good coping skills to manage her stress. For Harriett Sine to get better, more restful sleep.    Interventions Encouraged to attend Pulmonary Rehabilitation for the exercise;Stress management education Encouraged to attend  Pulmonary Rehabilitation for the exercise;Stress management education Encouraged to attend Pulmonary Rehabilitation for the exercise Encouraged to attend Pulmonary Rehabilitation for the exercise    Continue Psychosocial Services  No Follow up required No Follow up required No Follow up required No Follow up required             Psychosocial Discharge (Final Psychosocial Re-Evaluation):  Psychosocial Re-Evaluation - 10/20/23 0952       Psychosocial Re-Evaluation   Current issues with Current Sleep Concerns    Comments Tamecca denies any psy/soc needs during her monthly psychosocial evaluation. She thoroughly enjoyed her vacation to [place] and [place]. She was able to walk without much difficulty. She stated she paced herself and took breaks. Yulieth stated that if she wasn't in Pulm Rehab she would've never been able to go on vacation and do all the walking it took.    Expected Outcomes For Toyia to continue to attend pulmonary rehab and to have a positive outlook and good coping skills to manage her stress. For Harriett Sine to get better, more restful sleep.    Interventions Encouraged to attend Pulmonary Rehabilitation for the exercise    Continue Psychosocial Services  No Follow up required             Education: Education Goals: Education classes will be provided on a weekly basis, covering required topics. Participant will state understanding/return demonstration of topics presented.  Learning Barriers/Preferences:  Learning Barriers/Preferences - 07/16/23 1356       Learning Barriers/Preferences   Learning Barriers None    Learning Preferences Verbal Instruction;Written Material;Video;Individual Instruction             Education Topics: Know Your Numbers Group instruction that is supported by a PowerPoint presentation. Instructor discusses importance of knowing and understanding resting, exercise, and post-exercise oxygen saturation, heart rate, and blood pressure. Oxygen  saturation, heart rate, blood pressure, rating of perceived exertion, and dyspnea are reviewed along with a normal range for these values.  Flowsheet Row PULMONARY REHAB OTHER RESPIRATORY from 08[facility]05/2023 in [facility]  Date 08/05/23  Educator EP  Instruction Review Code 1- Verbalizes Understanding       Exercise for the Pulmonary Patient Group instruction that is supported by a PowerPoint presentation. Instructor discusses benefits of exercise, core components of exercise, frequency, duration, and intensity of an exercise routine, importance of utilizing pulse oximetry during exercise, safety while exercising, and options of places to exercise outside of rehab.  Flowsheet Row PULMONARY REHAB OTHER RESPIRATORY from 07/29/2023 in [facility] for Heart, Vascular, & Lung Health  Date 07/29/23  Educator EP  Instruction Review Code 1- Verbalizes Understanding       MET Level  Group instruction provided by PowerPoint, verbal discussion, and written material to support subject matter. Instructor reviews what METs are and how to increase METs.    Pulmonary Medications Verbally interactive group education provided by instructor with focus on inhaled medications and proper administration. Flowsheet Row PULMONARY REHAB OTHER RESPIRATORY from 07/22/2023 in [facility] for Heart, Vascular, & Lung Health  Date 07/22/23  Educator RT  Instruction Review Code 1- Verbalizes Understanding       Anatomy and Physiology of the Respiratory System Group instruction provided by PowerPoint, verbal discussion, and written material to support subject matter. Instructor reviews respiratory cycle and anatomical components of the respiratory system and their functions. Instructor also reviews differences in obstructive and restrictive respiratory diseases with examples of each.  Flowsheet Row PULMONARY REHAB  OTHER RESPIRATORY from 10/14/2023 in [facility] for Heart, Vascular, & Lung Health  Date 10/14/23  Educator RT  Instruction Review Code 1- Verbalizes Understanding       Oxygen Safety Group instruction provided by PowerPoint, verbal discussion, and written material to support subject matter. There is an overview of "What is Oxygen" and "Why do we need it".  Instructor also reviews how to create a safe environment for oxygen use, the importance of using oxygen as prescribed, and the risks of noncompliance. There is a brief discussion on traveling with oxygen and resources the patient may utilize. Flowsheet Row PULMONARY REHAB OTHER RESPIRATORY from 08/12/2023 in [facility] for Heart, Vascular, & Lung Health  Date 08/12/23  Educator RN  Instruction Review Code 1- Verbalizes Understanding       Oxygen Use Group instruction provided by PowerPoint, verbal discussion, and written material to discuss how supplemental oxygen is prescribed and different types of oxygen supply systems. Resources for more information are provided.    Breathing Techniques Group instruction that is supported by demonstration and informational handouts. Instructor discusses the benefits of pursed lip and diaphragmatic breathing and detailed demonstration on how to perform both.  Flowsheet Row PULMONARY REHAB OTHER RESPIRATORY from 08/26/2023 in [facility] for Heart, Vascular, & Lung Health  Date 08/26/23  Educator RN  Instruction Review Code 1- Verbalizes Understanding        Risk Factor Reduction Group instruction that is supported by a PowerPoint presentation. Instructor discusses the definition of a risk factor, different risk factors for pulmonary disease, and how the heart and lungs work together.   Pulmonary Diseases Group instruction provided by PowerPoint, verbal discussion, and written material to support subject matter.  Instructor gives an overview of the different type of pulmonary diseases. There is also a discussion on risk factors and symptoms as well as ways to manage the diseases. Flowsheet Row PULMONARY REHAB OTHER RESPIRATORY from 10/07/2023 in [place]  [facility] for Heart, Vascular, & Lung Health  Date 10/07/23  Educator RT  Instruction Review Code 1- Verbalizes Understanding       Stress and Energy Conservation Group instruction provided by PowerPoint, verbal discussion, and written material to support subject matter. Instructor gives an overview of stress and the impact it can have on the body. Instructor also reviews ways to reduce stress. There is also a discussion on energy conservation and ways to conserve energy throughout the day. Flowsheet Row PULMONARY REHAB OTHER RESPIRATORY from 09/02/2023 in [facility] for Heart, Vascular, & Lung Health  Date 09/02/23  Educator RN  Instruction Review Code 1- Verbalizes Understanding       Warning Signs and Symptoms Group instruction provided by PowerPoint, verbal discussion, and written material to support subject matter. Instructor reviews warning signs and symptoms of stroke, heart attack, cold and flu. Instructor also reviews ways to prevent the spread of infection.   Other Education Group or individual verbal, written, or video instructions that support the educational goals of the pulmonary rehab program.    Knowledge Questionnaire Score:  Knowledge Questionnaire Score - 07/16/23 1343       Knowledge Questionnaire Score   Pre Score 14/18             Core Components/Risk Factors/Patient Goals at Admission:  Personal Goals and Risk Factors at Admission - 07/16/23 1317       Core Components/Risk Factors/Patient Goals on Admission    Weight Management Yes;Weight Loss    Intervention Weight Management: Develop a combined nutrition and exercise program designed to reach desired caloric  intake, while maintaining appropriate intake of nutrient and fiber, sodium and fats, and appropriate energy expenditure required for the weight goal.;Weight Management: Provide education and appropriate resources to help participant work on and attain dietary goals.;Weight Management/Obesity: Establish reasonable short term and long term weight goals.;Obesity: Provide education and appropriate resources to help participant work on and attain dietary goals.    Expected Outcomes Short Term: Continue to assess and modify interventions until short term weight is achieved;Long Term: Adherence to nutrition and physical activity/exercise program aimed toward attainment of established weight goal;Weight Loss: Understanding of general recommendations for a balanced deficit meal plan, which promotes 1-2 lb weight loss per week and includes a negative energy balance of [phone] kcal/d;Understanding of distribution of calorie intake throughout the day with the consumption of 4-5 meals/snacks;Understanding recommendations for meals to include 15-35% energy as protein, 25-35% energy from fat, 35-60% energy from carbohydrates, less than 200mg  of dietary cholesterol, 20-35 gm of total fiber daily    Improve shortness of breath with ADL's Yes    Intervention Provide education, individualized exercise plan and daily activity instruction to help decrease symptoms of SOB with activities of daily living.    Expected Outcomes Short Term: Improve cardiorespiratory fitness to achieve a reduction of symptoms when performing ADLs;Long Term: Be able to perform more ADLs without symptoms or delay the onset of symptoms             Core Components/Risk Factors/Patient Goals Review:   Goals and Risk Factor Review     Row Name 07/28/23 1130 08/27/23 1331 09/21/23 0900 10/20/23 1005       Core Components/Risk Factors/Patient Goals Review   Personal Goals Review Weight Management/Obesity;Improve shortness of breath with  ADL's;Develop more efficient breathing techniques such as purse lipped breathing and diaphragmatic breathing and practicing self-pacing with activity. Weight Management/Obesity;Improve shortness of breath with ADL's;Develop  more efficient breathing techniques such as purse lipped breathing and diaphragmatic breathing and practicing self-pacing with activity. Weight Management/Obesity;Improve shortness of breath with ADL's;Develop more efficient breathing techniques such as purse lipped breathing and diaphragmatic breathing and practicing self-pacing with activity. Weight Management/Obesity;Improve shortness of breath with ADL's;Develop more efficient breathing techniques such as purse lipped breathing and diaphragmatic breathing and practicing self-pacing with activity.    Review Unable to assess. Shanai has completed 1 session. Goal in action for weight loss. Sherronda has lost ~4# since starting the program. She is working with our dietician on a plan of action and is open to the ideas and suggestions. Goal progressing on developing more efficient breathing techniques such as purse lipped breathing and diaphragmatic breathing; and practicing self-pacing with activity. We are teaching Harriett Sine how and when to initiate PLB and listening to her body to self-pace herself while exercising. We have been working on this for her upcoming trip to [place]. Goal in progress on improving her shortness of breath with ADLs. She has increased both her workload and METs while maintaining her oxygen saturation >88% on room air. We will continue to monitor and assess Emalynn's progress in the program. Dennie Bible will continue to benefit from PR for nutrition, education, exercise, and lifestyle modification. Brenleigh has not been to [employer] since 11/5. She is currently on vacation to [place] and [place]. Goal in action for weight loss. Teeya has lost ~3# since starting the program. She is working with our dietician on a plan of action and is open to  the ideas and suggestions. Goal progressing on developing more efficient breathing techniques such as purse lipped breathing and diaphragmatic breathing; and practicing self-pacing with activity. We are teaching Harriett Sine how and when to initiate PLB and listening to her body to self-pace herself while exercising. Goal met on improving her shortness of breath with ADLs. She has increased both her workload and METs while maintaining her oxygen saturation >88% on room air. We will continue to monitor and assess Renetta's progress in the program. Janellys will continue to benefit from PR for nutrition, education, exercise, and lifestyle modification. Monthly review of patient's Core Components/Risk Factors/Patient Goals are as follows: Goal in progress for weight loss. Effy has lost ~1# since starting the program. She worked with our dietician on a plan of action and is open to ideas and suggestions. Her weight has been up and down during her time in the program. Goal met on developing more efficient breathing techniques such as purse lipped breathing and diaphragmatic breathing; and practicing self-pacing with activity. Jilliyn knows how to initiate PLB and listen to her body to self-pace herself while exercising. Goal met on improving her shortness of breath with ADLs. She has increased both her workload and METs while maintaining her oxygen saturation >88% on room air. We will continue to monitor and assess Gretna's progress in the program. Chazlyn will continue to benefit from PR for nutrition, education, exercise, and lifestyle modification.    Expected Outcomes For Arely to lose weight, improve her shortness of breath with ADLs, develop more efficient breathing techniques, and learn how to self-pace. For Harriett Sine to lose weight, improve her shortness of breath with ADLs, develop more efficient breathing techniques, and learn how to self-pace. For Harriett Sine to lose weight, develop more efficient breathing techniques, and learn how  to self-pace. For Harriett Sine to lose weight             Core Components/Risk Factors/Patient Goals at Discharge (Final Review):  Goals and Risk Factor Review - 10/20/23 1005       Core Components/Risk Factors/Patient Goals Review   Personal Goals Review Weight Management/Obesity;Improve shortness of breath with ADL's;Develop more efficient breathing techniques such as purse lipped breathing and diaphragmatic breathing and practicing self-pacing with activity.    Review Monthly review of patient's Core Components/Risk Factors/Patient Goals are as follows: Goal in progress for weight loss. Mechel has lost ~1# since starting the program. She worked with our dietician on a plan of action and is open to ideas and suggestions. Her weight has been up and down during her time in the program. Goal met on developing more efficient breathing techniques such as purse lipped breathing and diaphragmatic breathing; and practicing self-pacing with activity. Teaghan knows how to initiate PLB and listen to her body to self-pace herself while exercising. Goal met on improving her shortness of breath with ADLs. She has increased both her workload and METs while maintaining her oxygen saturation >88% on room air. We will continue to monitor and assess Marvelene's progress in the program. Desiyah will continue to benefit from PR for nutrition, education, exercise, and lifestyle modification.    Expected Outcomes For Ellason to lose weight             ITP Comments: Pt is making expected progress toward Pulmonary Rehab goals after completing 18 session(s). Recommend continued exercise, life style modification, education, and utilization of breathing techniques to increase stamina and strength, while also decreasing shortness of breath with exertion.     Comments: Dr. Mechele Collin is Medical Director for Pulmonary Rehab at [facility].

## 2023-10-28 ENCOUNTER — Encounter (HOSPITAL_COMMUNITY): Admission: RE | Admit: 2023-10-28 | Payer: PPO | Source: Ambulatory Visit

## 2023-11-02 ENCOUNTER — Encounter (HOSPITAL_COMMUNITY)
Admission: RE | Admit: 2023-11-02 | Discharge: 2023-11-02 | Disposition: A | Discharge status: Home / Self Care | Payer: PPO | Source: Ambulatory Visit | Attending: Pulmonary Disease | Admitting: Pulmonary Disease

## 2023-11-02 DIAGNOSIS — R0602 Shortness of breath: Secondary | ICD-10-CM

## 2023-11-02 NOTE — Progress Notes (Signed)
 Daily Session Note  Patient Details  Name: Julie [name] MRN: [id number] Date of Birth: 1941/01/29 Referring Provider:   Conrad Ports Pulmonary Rehab Walk Test from 07/16/2023 in [facility] for Heart, Vascular, & Lung Health  Referring Provider Kassie       Encounter Date: 11/02/2023  Check In:  Session Check In - 11/02/23 1323       Check-In   Supervising physician immediately available to respond to emergencies [facility] MD immediately available    Physician(s) Orren Fabry, PA    Location [facility]    Staff Present Ronal Levin, RN, BSN;Randi Midge BS, ACSM-CEP, Exercise Physiologist;Casey Claudene, RT    Virtual Visit No    Medication changes reported     No    Fall or balance concerns reported    No    Tobacco Cessation No Change    Warm-up and Cool-down Performed as group-led instruction    Resistance Training Performed Yes    VAD Patient? No    PAD/SET Patient? No      Pain Assessment   Currently in Pain? No/denies    Multiple Pain Sites No             Capillary Blood Glucose: No results found for this or any previous visit (from the past 24 hours).    Social History   Tobacco Use  Smoking Status Never   Passive exposure: Past  Smokeless Tobacco Never    Goals Met:  Independence with exercise equipment Exercise tolerated well No report of concerns or symptoms today Strength training completed today  Goals Unmet:  Not Applicable  Comments: Service time is from 1308 to 1437    Dr. Slater Kassie is Medical Director for Pulmonary Rehab at [facility].

## 2023-11-04 ENCOUNTER — Encounter (HOSPITAL_COMMUNITY)
Admission: RE | Admit: 2023-11-04 | Discharge: 2023-11-04 | Disposition: A | Discharge status: Home / Self Care | Payer: PPO | Source: Ambulatory Visit | Attending: Pulmonary Disease | Admitting: Pulmonary Disease

## 2023-11-04 DIAGNOSIS — R0602 Shortness of breath: Secondary | ICD-10-CM | POA: Insufficient documentation

## 2023-11-04 NOTE — Progress Notes (Signed)
 Daily Session Note  Patient Details  Name: Julie [name] MRN: [id number] Date of Birth: 1941/04/27 Referring Provider:   Conrad Ports Pulmonary Rehab Walk Test from 07/16/2023 in [facility] for Heart, Vascular, & Lung Health  Referring Provider Kassie       Encounter Date: 11/04/2023  Check In:  Session Check In - 11/04/23 1352       Check-In   Supervising physician immediately available to respond to emergencies [facility] immediately available    Physician(s) Rosaline Skains, NP    Location [facility]    Staff Present Ronal Levin, RN, BSN;Randi Midge BS, ACSM-CEP, Exercise Physiologist;Casey Claudene, RT    Virtual Visit No    Medication changes reported     No    Fall or balance concerns reported    No    Tobacco Cessation No Change    Warm-up and Cool-down Performed as group-led instruction    Resistance Training Performed Yes    VAD Patient? No    PAD/SET Patient? No      Pain Assessment   Currently in Pain? No/denies    Multiple Pain Sites No             Capillary Blood Glucose: No results found for this or any previous visit (from the past 24 hours).    Social History   Tobacco Use  Smoking Status Never   Passive exposure: Past  Smokeless Tobacco Never    Goals Met:  Independence with exercise equipment Exercise tolerated well No report of concerns or symptoms today Strength training completed today  Goals Unmet:  Not Applicable  Comments: Service time is from 1313 to 1440    Dr. Slater Kassie is Medical Director for Pulmonary Rehab at [facility].

## 2023-11-09 ENCOUNTER — Encounter (HOSPITAL_COMMUNITY)
Admission: RE | Admit: 2023-11-09 | Discharge: 2023-11-09 | Disposition: A | Discharge status: Home / Self Care | Payer: PPO | Source: Ambulatory Visit | Attending: Pulmonary Disease

## 2023-11-09 VITALS — Wt 132.3 lb

## 2023-11-09 DIAGNOSIS — R0602 Shortness of breath: Secondary | ICD-10-CM

## 2023-11-09 NOTE — Progress Notes (Signed)
 Daily Session Note  Patient Details  Name: LAINA GUERRIERI MRN: [id number] Date of Birth: 1941/06/28 Referring Provider:   Conrad Ports Pulmonary Rehab Walk Test from 07/16/2023 in [facility] for Heart, Vascular, & Lung Health  Referring Provider Kassie       Encounter Date: 11/09/2023  Check In:  Session Check In - 11/09/23 1322       Check-In   Supervising physician immediately available to respond to emergencies [facility] immediately available    Physician(s) Rosaline Skains, NP    [facility]    Staff Present Ronal Levin, RN, BSN;Randi Midge BS, ACSM-CEP, Exercise Physiologist;Casey Claudene Neita Moats, MS, ACSM-CEP, Exercise Physiologist    Virtual Visit No    Medication changes reported     No    Fall or balance concerns reported    No    Tobacco Cessation No Change    Warm-up and Cool-down Performed as group-led instruction    Resistance Training Performed Yes    VAD Patient? No    PAD/SET Patient? No      Pain Assessment   Currently in Pain? No/denies    Multiple Pain Sites No             Capillary Blood Glucose: No results found for this or any previous visit (from the past 24 hours).   Exercise Prescription Changes - 11/09/23 1400       Response to Exercise   Blood Pressure (Admit) 120/70    Blood Pressure (Exercise) 152/88    Blood Pressure (Exit) 108/78    Heart Rate (Admit) 80 bpm    Heart Rate (Exercise) 130 bpm    Heart Rate (Exit) 95 bpm    Oxygen Saturation (Admit) 96 %    Oxygen Saturation (Exercise) 94 %    Oxygen Saturation (Exit) 94 %    Rating of Perceived Exertion (Exercise) 11    Perceived Dyspnea (Exercise) 2    Duration Continue with 30 min of aerobic exercise without signs/symptoms of physical distress.    Intensity THRR unchanged      Progression   Progression Continue to progress workloads to maintain intensity without signs/symptoms of physical distress.      Resistance  Training   Training Prescription Yes    Weight blue bands    Reps 10-15    Time 10 Minutes      Recumbant Bike   Level 4    Minutes 15    METs 3.7      Recumbant Elliptical   Level 5    Minutes 15    METs 5             Social History   Tobacco Use  Smoking Status Never   Passive exposure: Past  Smokeless Tobacco Never    Goals Met:  Independence with exercise equipment Exercise tolerated well No report of concerns or symptoms today Strength training completed today  Goals Unmet:  Not Applicable  Comments: Service time is from 1307 to 1436    Dr. Slater Kassie is Medical Director for Pulmonary Rehab at [facility].

## 2023-11-11 ENCOUNTER — Encounter (HOSPITAL_COMMUNITY)
Admission: RE | Admit: 2023-11-11 | Discharge: 2023-11-11 | Disposition: A | Discharge status: Home / Self Care | Payer: PPO | Source: Ambulatory Visit | Attending: Pulmonary Disease | Admitting: Pulmonary Disease

## 2023-11-11 DIAGNOSIS — R0602 Shortness of breath: Secondary | ICD-10-CM | POA: Diagnosis not present

## 2023-11-11 NOTE — Progress Notes (Signed)
 Daily Session Note  Patient Details  Name: Julie [name] MRN: [id number] Date of Birth: January 15, 1941 Referring Provider:   Conrad Ports Pulmonary Rehab Walk Test from 07/16/2023 in [facility] for Heart, Vascular, & Lung Health  Referring Provider Kassie       Encounter Date: 11/11/2023  Check In:  Session Check In - 11/11/23 1320       Check-In   Supervising physician immediately available to respond to emergencies [facility] immediately available    Physician(s) Barnie Press, NP    Location [facility]    Staff Present Ronal Levin, RN, BSN;Randi Midge BS, ACSM-CEP, Exercise Physiologist;Jasmon Mattice Claudene Neita Moats, MS, ACSM-CEP, Exercise Physiologist    Virtual Visit No    Medication changes reported     No    Fall or balance concerns reported    No    Tobacco Cessation No Change    Warm-up and Cool-down Performed as group-led instruction    Resistance Training Performed Yes    VAD Patient? No    PAD/SET Patient? No      Pain Assessment   Currently in Pain? No/denies    Multiple Pain Sites No             Capillary Blood Glucose: No results found for this or any previous visit (from the past 24 hours).    Social History   Tobacco Use  Smoking Status Never   Passive exposure: Past  Smokeless Tobacco Never    Goals Met:  Proper associated with RPD/PD & O2 Sat Independence with exercise equipment Exercise tolerated well No report of concerns or symptoms today Strength training completed today  Goals Unmet:  Not Applicable  Comments: Service time is from 1310 to 1435.    Dr. Slater Kassie is Medical Director for Pulmonary Rehab at [facility].

## 2023-11-16 ENCOUNTER — Encounter (HOSPITAL_COMMUNITY)
Admission: RE | Admit: 2023-11-16 | Discharge: 2023-11-16 | Disposition: A | Discharge status: Home / Self Care | Payer: PPO | Source: Ambulatory Visit | Attending: Pulmonary Disease | Admitting: Pulmonary Disease

## 2023-11-16 DIAGNOSIS — R0602 Shortness of breath: Secondary | ICD-10-CM | POA: Diagnosis not present

## 2023-11-16 NOTE — Progress Notes (Signed)
 Daily Session Note  Patient Details  Name: Julie [name] MRN: [id number] Date of Birth: Jul 12, 1941 Referring Provider:   Conrad Ports Pulmonary Rehab Walk Test from 07/16/2023 in [facility] for Heart, Vascular, & Lung Health  Referring Provider Kassie       Encounter Date: 11/16/2023  Check In:  Session Check In - 11/16/23 1410       Check-In   Supervising physician immediately available to respond to emergencies [facility] immediately available    Physician(s) Lamarr Satterfield, NP    [facility]    Staff Present Ronal Levin, RN, BSN;Randi Midge HECKLE, ACSM-CEP, Exercise Physiologist;Casey Claudene Neita Moats, MS, ACSM-CEP, Exercise Physiologist    Virtual Visit No    Medication changes reported     No    Fall or balance concerns reported    No    Tobacco Cessation No Change    Warm-up and Cool-down Performed as group-led instruction    Resistance Training Performed Yes    VAD Patient? No    PAD/SET Patient? No      Pain Assessment   Currently in Pain? No/denies    Multiple Pain Sites No             Capillary Blood Glucose: No results found for this or any previous visit (from the past 24 hours).    Social History   Tobacco Use  Smoking Status Never   Passive exposure: Past  Smokeless Tobacco Never    Goals Met:  Proper associated with RPD/PD & O2 Sat Independence with exercise equipment Exercise tolerated well No report of concerns or symptoms today Strength training completed today  Goals Unmet:  Not Applicable  Comments: Service time is from 1312 to 1432.    Dr. Slater Kassie is Medical Director for Pulmonary Rehab at [facility].

## 2023-11-18 ENCOUNTER — Encounter (HOSPITAL_COMMUNITY)
Admission: RE | Admit: 2023-11-18 | Discharge: 2023-11-18 | Disposition: A | Discharge status: Home / Self Care | Payer: PPO | Source: Ambulatory Visit | Attending: Pulmonary Disease | Admitting: Pulmonary Disease

## 2023-11-18 DIAGNOSIS — R0602 Shortness of breath: Secondary | ICD-10-CM

## 2023-11-18 NOTE — Progress Notes (Signed)
Daily Session Note  Patient Details  Name: Julie [name] MRN: [id number] Date of Birth: February 09, 1941 Referring Provider:   Doristine Devoid Pulmonary Rehab Walk Test from 07/16/2023 in [facility] for Heart, Vascular, & Lung Health  Referring Provider Everardo All       Encounter Date: 11/18/2023  Check In:  Session Check In - 11/18/23 1344       Check-In   Supervising physician immediately available to respond to emergencies [facility] immediately available    Physician(s) Joni Reining, NP    Location [facility]    Staff Present [name], RN, BSN;[name], ACSM-CEP, Exercise Physiologist;Casey Charlean Sanfilippo, MS, ACSM-CEP, Exercise Physiologist    Virtual Visit No    Medication changes reported     No    Fall or balance concerns reported    No    Tobacco Cessation No Change    Warm-up and Cool-down Performed as group-led instruction    Resistance Training Performed Yes    VAD Patient? No    PAD/SET Patient? No      Pain Assessment   Currently in Pain? No/denies    Multiple Pain Sites No             Capillary Blood Glucose: No results found for this or any previous visit (from the past 24 hours).    Social History   Tobacco Use  Smoking Status Never   Passive exposure: Past  Smokeless Tobacco Never    Goals Met:  Independence with exercise equipment Exercise tolerated well No report of concerns or symptoms today Strength training completed today  Goals Unmet:  Not Applicable  Comments: Service time is from 1257 to 1434    Dr. Mechele Collin is Medical Director for Pulmonary Rehab at [facility].

## 2023-11-19 ENCOUNTER — Telehealth (HOSPITAL_BASED_OUTPATIENT_CLINIC_OR_DEPARTMENT_OTHER): Payer: Self-pay | Admitting: Pulmonary Disease

## 2023-11-19 DIAGNOSIS — R0602 Shortness of breath: Secondary | ICD-10-CM

## 2023-11-19 NOTE — Telephone Encounter (Signed)
Patient benefited from use of rollator during pulmonary rehab. Please place order for rollator.

## 2023-11-23 ENCOUNTER — Encounter (HOSPITAL_COMMUNITY)
Admission: RE | Admit: 2023-11-23 | Discharge: 2023-11-23 | Disposition: A | Discharge status: Home / Self Care | Payer: PPO | Source: Ambulatory Visit | Attending: Pulmonary Disease

## 2023-11-23 VITALS — Wt 134.0 lb

## 2023-11-23 DIAGNOSIS — R0602 Shortness of breath: Secondary | ICD-10-CM | POA: Diagnosis not present

## 2023-11-23 NOTE — Progress Notes (Signed)
Daily Session Note  Patient Details  Name: Julie [name] MRN: [id number] Date of Birth: 06-22-1941 Referring Provider:   Doristine Devoid Pulmonary Rehab Walk Test from 07/16/2023 in [facility] for Heart, Vascular, & Lung Health  Referring Provider Everardo All       Encounter Date: 11/23/2023  Check In:  Session Check In - 11/23/23 1402       Check-In   Supervising physician immediately available to respond to emergencies [facility] immediately available    Physician(s) Bernadene Person, NP    Location [facility]    Staff Present [name], RN, BSN;Casey Katrinka Blazing, Zella Richer, MS, ACSM-CEP, Exercise Physiologist;Johnny Hale Bogus, MS, Exercise Physiologist    Virtual Visit No    Medication changes reported     No    Fall or balance concerns reported    No    Tobacco Cessation No Change    Warm-up and Cool-down Performed as group-led instruction    Resistance Training Performed Yes    VAD Patient? No    PAD/SET Patient? No      Pain Assessment   Currently in Pain? No/denies    Multiple Pain Sites No             Capillary Blood Glucose: No results found for this or any previous visit (from the past 24 hours).   Exercise Prescription Changes - 11/23/23 1500       Response to Exercise   Blood Pressure (Admit) 126/72    Blood Pressure (Exercise) 172/80    Blood Pressure (Exit) 112/68    Heart Rate (Admit) 64 bpm    Heart Rate (Exercise) 118 bpm    Heart Rate (Exit) 91 bpm    Oxygen Saturation (Admit) 95 %    Oxygen Saturation (Exercise) 92 %    Oxygen Saturation (Exit) 92 %    Rating of Perceived Exertion (Exercise) 11    Perceived Dyspnea (Exercise) 2    Duration Continue with 30 min of aerobic exercise without signs/symptoms of physical distress.    Intensity THRR unchanged      Progression   Progression Continue to progress workloads to maintain intensity without signs/symptoms of physical distress.      Resistance Training    Training Prescription Yes    Weight blue bands    Reps 10-15    Time 10 Minutes      Recumbant Bike   Level 4    Minutes 15    METs 4.2      Recumbant Elliptical   Level 5    Minutes 15    METs 5.2             Social History   Tobacco Use  Smoking Status Never   Passive exposure: Past  Smokeless Tobacco Never    Goals Met:  Independence with exercise equipment Exercise tolerated well No report of concerns or symptoms today Strength training completed today  Goals Unmet:  Not Applicable  Comments: Service time is from 1316 to 1433    Dr. Mechele Collin is Medical Director for Pulmonary Rehab at [facility].

## 2023-11-23 NOTE — Telephone Encounter (Signed)
Rollator order placed 

## 2023-11-23 NOTE — Telephone Encounter (Signed)
Please place order for rollator

## 2023-11-23 NOTE — Telephone Encounter (Signed)
??   We will need an order

## 2023-11-24 NOTE — Progress Notes (Signed)
Pulmonary Individual Treatment Plan  Patient Details  Name: CAMILA BOSCIA MRN: [id number] Date of Birth: [birth year] Referring Provider:   Doristine Devoid Pulmonary Rehab Walk Test from 07/16/2023 in [facility]  Referring Provider Everardo All       Initial Encounter Date:  Flowsheet Row Pulmonary Rehab Walk Test from 07/16/2023 in [facility]  Date 07/16/23       Visit Diagnosis: Shortness of breath  Patient's Home Medications on Admission:   Current Outpatient Medications:    albuterol (VENTOLIN HFA) 108 (90 Base) MCG/ACT inhaler, Inhale 2 puffs into the lungs every 6 (six) hours as needed for wheezing or shortness of breath., Disp: 6.7 g, Rfl: 2   atorvastatin (LIPITOR) 20 MG tablet, Take 1 tablet (20 mg total) by mouth daily., Disp: 90 tablet, Rfl: 3   gabapentin (NEURONTIN) 100 MG capsule, Take 100 mg by mouth as needed., Disp: , Rfl:    levothyroxine (SYNTHROID) 50 MCG tablet, TAKE 1 TABLET(50 MCG) BY MOUTH DAILY, Disp: 90 tablet, Rfl: 0   losartan (COZAAR) 50 MG tablet, Take 1 tablet (50 mg total) by mouth daily., Disp: 90 tablet, Rfl: 3   VITAMIN E PO, Take by mouth., Disp: , Rfl:   Past Medical History: 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   Other bursitis disorders    history   Personal history of colonic polyps    PONV (postoperative nausea and vomiting)    Pre-diabetes     Pure hypercholesterolemia    Spondylolisthesis, cervical region    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    skin -spots on face, forehead and leg removed   SVD (spontaneous vaginal delivery)    x 1   Unspecified disorder of thyroid    Unspecified hypothyroidism     Tobacco Use: Social History   Tobacco Use  Smoking Status Never   Passive exposure: Past  Smokeless Tobacco Never    Labs: Review Flowsheet  More data exists      Latest Ref Rng & Units 01/16/2019 12/15/2019 09/02/2020 09/22/2021 02/09/2023  Labs for ITP Cardiac and Pulmonary Rehab  Cholestrol 100 - 199 mg/dL - - 401  - 027   LDL (calc) 0 - 99 mg/dL - - 253  - 664   HDL-C >39 mg/dL - - 49  - 51   Trlycerides 0 - 149 mg/dL - - 76  - 403   Hemoglobin A1c 4.0 - 5.6 % 5.6  6.0  - 5.6  -    Capillary Blood Glucose: Lab Results  Component Value Date   GLUCAP 99 01/23/2017   GLUCAP 97 01/22/2017   GLUCAP 166 (H) 12/30/2012     Pulmonary Assessment Scores:  Pulmonary Assessment Scores     Row Name 07/16/23 1341 11/16/23 1600 11/18/23 1511  ADL UCSD   ADL Phase Entry Exit Exit   SOB Score total 60 41 --     CAT Score   CAT Score 19 12 --     mMRC Score   mMRC Score 4 -- 2           UCSD: Self-administered rating of dyspnea associated with activities of daily living (ADLs) 6-point scale (0 = "not at all" to 5 = "maximal or unable to do because of breathlessness")  Scoring Scores range from 0 to 120.  Minimally important difference is 5 units  CAT: CAT can identify the health impairment of COPD patients and is better correlated with disease progression.  CAT has a scoring range of zero to 40. The CAT score is classified into four groups of low (less than 10), medium (10 - 20), high (21-30) and very high (31-40) based on the impact level of disease on health status. A CAT score over 10 suggests significant symptoms.  A worsening CAT score could be explained by an exacerbation, poor medication  adherence, poor inhaler technique, or progression of COPD or comorbid conditions.  CAT MCID is 2 points  mMRC: mMRC (Modified Medical Research Council) Dyspnea Scale is used to assess the degree of baseline functional disability in patients of respiratory disease due to dyspnea. No minimal important difference is established. A decrease in score of 1 point or greater is considered a positive change.   Pulmonary Function Assessment:  Pulmonary Function Assessment - 07/16/23 1311       Breath   Bilateral Breath Sounds Clear    Shortness of Breath Yes;Limiting activity             Exercise Target Goals: Exercise Program Goal: Individual exercise prescription set using results from initial 6 min walk test and THRR while considering  patient's activity barriers and safety.   Exercise Prescription Goal: Initial exercise prescription builds to 30-45 minutes a day of aerobic activity, 2-3 days per week.  Home exercise guidelines will be given to patient during program as part of exercise prescription that the participant will acknowledge.  Activity Barriers & Risk Stratification:  Activity Barriers & Cardiac Risk Stratification - 07/16/23 1310       Activity Barriers & Cardiac Risk Stratification   Activity Barriers History of Falls;Deconditioning;Muscular Weakness;Shortness of Breath;Balance Concerns;Back Problems             6 Minute Walk:  6 Minute Walk     Row Name 07/16/23 1400 11/18/23 1505       6 Minute Walk   Phase Initial Discharge    Distance 823 feet 1140 feet    Distance % Change -- 38.52 %    Distance Feet Change -- 317 ft    Walk Time 6 minutes 6 minutes    # of Rest Breaks 0 0    MPH 1.56 2.16    METS 1.2 2.33    RPE 11 11    Perceived Dyspnea  1 2    VO2 Peak 4.19 8.15    Resting HR 81 bpm 71 bpm    Resting BP 130/72 144/74    Resting Oxygen Saturation  94 % 97 %    Exercise Oxygen Saturation  during 6 min walk 92 % 91 %    Max Ex. HR 105 bpm  119 bpm    Max Ex. BP 132/82 180/80    2 Minute Post BP 128/80 136/76      Interval HR   1  Minute HR 95 97    2 Minute HR 107 101    3 Minute HR 103 111    4 Minute HR 105 115    5 Minute HR 101 116    6 Minute HR 100 119    2 Minute Post HR 86 89    Interval Heart Rate? Yes --      Interval Oxygen   Interval Oxygen? Yes --    Baseline Oxygen Saturation % 94 % 97 %    1 Minute Oxygen Saturation % 93 % 97 %    1 Minute Liters of Oxygen 0 L 0 L    2 Minute Oxygen Saturation % 94 % 96 %    2 Minute Liters of Oxygen 0 L 0 L    3 Minute Oxygen Saturation % 97 % 96 %    3 Minute Liters of Oxygen 0 L 0 L    4 Minute Oxygen Saturation % 92 % 99 %    4 Minute Liters of Oxygen 0 L 0 L    5 Minute Oxygen Saturation % 94 % 91 %    5 Minute Liters of Oxygen 0 L 0 L    6 Minute Oxygen Saturation % 95 % 93 %    6 Minute Liters of Oxygen 0 L 0 L    2 Minute Post Oxygen Saturation % 97 % 99 %    2 Minute Post Liters of Oxygen 0 L 0 L             Oxygen Initial Assessment:  Oxygen Initial Assessment - 07/16/23 1310       Home Oxygen   Home Oxygen Device None    Sleep Oxygen Prescription None    Home Exercise Oxygen Prescription None    Home Resting Oxygen Prescription None      Initial 6 min Walk   Oxygen Used None      Program Oxygen Prescription   Program Oxygen Prescription None      Intervention   Short Term Goals To learn and understand importance of maintaining oxygen saturations>88%;To learn and demonstrate proper use of respiratory medications;To learn and understand importance of monitoring SPO2 with pulse oximeter and demonstrate accurate use of the pulse oximeter.;To learn and demonstrate proper pursed lip breathing techniques or other breathing techniques.     Long  Term Goals Maintenance of O2 saturations>88%;Compliance with respiratory medication;Demonstrates proper use of MDI's;Exhibits proper breathing techniques, such as pursed lip breathing or other method  taught during program session;Verbalizes importance of monitoring SPO2 with pulse oximeter and return demonstration             Oxygen Re-Evaluation:  Oxygen Re-Evaluation     Row Name 07/27/23 0934 08/27/23 1130 09/21/23 0820 10/14/23 0854 11/16/23 0840     Program Oxygen Prescription   Program Oxygen Prescription None None None None None     Home Oxygen   Home Oxygen Device None None None None None   Sleep Oxygen Prescription None None None None None   Home Exercise Oxygen Prescription None None None None None   Home Resting Oxygen Prescription None None None None None     Goals/Expected Outcomes   Short Term Goals To learn and understand importance of maintaining oxygen saturations>88%;To learn and demonstrate proper use of respiratory medications;To learn and understand importance of monitoring SPO2 with pulse oximeter and demonstrate accurate use of the pulse oximeter.;To learn and demonstrate proper pursed lip breathing techniques or other breathing  techniques.  To learn and understand importance of maintaining oxygen saturations>88%;To learn and demonstrate proper use of respiratory medications;To learn and understand importance of monitoring SPO2 with pulse oximeter and demonstrate accurate use of the pulse oximeter.;To learn and demonstrate proper pursed lip breathing techniques or other breathing techniques.  To learn and understand importance of maintaining oxygen saturations>88%;To learn and demonstrate proper use of respiratory medications;To learn and understand importance of monitoring SPO2 with pulse oximeter and demonstrate accurate use of the pulse oximeter.;To learn and demonstrate proper pursed lip breathing techniques or other breathing techniques.  To learn and understand importance of maintaining oxygen saturations>88%;To learn and demonstrate proper use of respiratory medications;To learn and understand importance of monitoring SPO2 with pulse oximeter and demonstrate  accurate use of the pulse oximeter.;To learn and demonstrate proper pursed lip breathing techniques or other breathing techniques.  To learn and understand importance of maintaining oxygen saturations>88%;To learn and demonstrate proper use of respiratory medications;To learn and understand importance of monitoring SPO2 with pulse oximeter and demonstrate accurate use of the pulse oximeter.;To learn and demonstrate proper pursed lip breathing techniques or other breathing techniques.    Long  Term Goals Maintenance of O2 saturations>88%;Compliance with respiratory medication;Demonstrates proper use of MDI's;Exhibits proper breathing techniques, such as pursed lip breathing or other method taught during program session;Verbalizes importance of monitoring SPO2 with pulse oximeter and return demonstration Maintenance of O2 saturations>88%;Compliance with respiratory medication;Demonstrates proper use of MDI's;Exhibits proper breathing techniques, such as pursed lip breathing or other method taught during program session;Verbalizes importance of monitoring SPO2 with pulse oximeter and return demonstration Maintenance of O2 saturations>88%;Compliance with respiratory medication;Demonstrates proper use of MDI's;Exhibits proper breathing techniques, such as pursed lip breathing or other method taught during program session;Verbalizes importance of monitoring SPO2 with pulse oximeter and return demonstration Maintenance of O2 saturations>88%;Compliance with respiratory medication;Demonstrates proper use of MDI's;Exhibits proper breathing techniques, such as pursed lip breathing or other method taught during program session;Verbalizes importance of monitoring SPO2 with pulse oximeter and return demonstration Maintenance of O2 saturations>88%;Compliance with respiratory medication;Demonstrates proper use of MDI's;Exhibits proper breathing techniques, such as pursed lip breathing or other method taught during program  session;Verbalizes importance of monitoring SPO2 with pulse oximeter and return demonstration   Goals/Expected Outcomes Compliance and understanding of oxygen saturation monitoring and breathing techniques to decrease shortness of breath Compliance and understanding of oxygen saturation monitoring and breathing techniques to decrease shortness of breath Compliance and understanding of oxygen saturation monitoring and breathing techniques to decrease shortness of breath Compliance and understanding of oxygen saturation monitoring and breathing techniques to decrease shortness of breath Compliance and understanding of oxygen saturation monitoring and breathing techniques to decrease shortness of breath            Oxygen Discharge (Final Oxygen Re-Evaluation):  Oxygen Re-Evaluation - 11/16/23 0840       Program Oxygen Prescription   Program Oxygen Prescription None      Home Oxygen   Home Oxygen Device None    Sleep Oxygen Prescription None    Home Exercise Oxygen Prescription None    Home Resting Oxygen Prescription None      Goals/Expected Outcomes   Short Term Goals To learn and understand importance of maintaining oxygen saturations>88%;To learn and demonstrate proper use of respiratory medications;To learn and understand importance of monitoring SPO2 with pulse oximeter and demonstrate accurate use of the pulse oximeter.;To learn and demonstrate proper pursed lip breathing techniques or other breathing techniques.     Long  Term  Goals Maintenance of O2 saturations>88%;Compliance with respiratory medication;Demonstrates proper use of MDI's;Exhibits proper breathing techniques, such as pursed lip breathing or other method taught during program session;Verbalizes importance of monitoring SPO2 with pulse oximeter and return demonstration    Goals/Expected Outcomes Compliance and understanding of oxygen saturation monitoring and breathing techniques to decrease shortness of breath              Initial Exercise Prescription:  Initial Exercise Prescription - 07/16/23 1400       Date of Initial Exercise RX and Referring Provider   Date 07/16/23    Referring Provider Everardo All    Expected Discharge Date 10/14/23      Recumbant Bike   Level 1    Minutes 15    METs 1.5      Recumbant Elliptical   Level 1    Minutes 15    METs 2      Prescription Details   Frequency (times per week) 2    Duration Progress to 30 minutes of continuous aerobic without signs/symptoms of physical distress      Intensity   THRR 40-80% of Max Heartrate 55-110    Ratings of Perceived Exertion 11-13    Perceived Dyspnea 0-4      Progression   Progression Continue to progress workloads to maintain intensity without signs/symptoms of physical distress.      Resistance Training   Training Prescription Yes    Weight red bands    Reps 10-15             Perform Capillary Blood Glucose checks as needed.  Exercise Prescription Changes:   Exercise Prescription Changes     Row Name 08/03/23 1500 08/17/23 1236 08/26/23 1500 08/31/23 1500 09/07/23 1237     Response to Exercise   Blood Pressure (Admit) 122/70 122/74 -- 114/76 142/72  running late, leaving for [place] today after class   Blood Pressure (Exercise) 158/76 -- -- 144/72 --   Blood Pressure (Exit) 102/62 116/72 -- 120/68 104/68   Heart Rate (Admit) 85 bpm 98 bpm -- 75 bpm 88 bpm   Heart Rate (Exercise) 103 bpm 119 bpm -- 120 bpm 118 bpm   Heart Rate (Exit) 84 bpm 102 bpm -- 90 bpm 94 bpm   Oxygen Saturation (Admit) 93 % 95 % -- 96 % 95 %   Oxygen Saturation (Exercise) 94 % 92 % -- 93 % 95 %   Oxygen Saturation (Exit) 93 % 96 % -- 95 % 95 %   Rating of Perceived Exertion (Exercise) 13 11 -- 12 12   Perceived Dyspnea (Exercise) 3 2 -- 2 2   Duration Continue with 30 min of aerobic exercise without signs/symptoms of physical distress. Continue with 30 min of aerobic exercise without signs/symptoms of physical distress. --  Continue with 30 min of aerobic exercise without signs/symptoms of physical distress. Continue with 30 min of aerobic exercise without signs/symptoms of physical distress.   Intensity THRR unchanged THRR unchanged -- THRR unchanged THRR unchanged     Progression   Progression Continue to progress workloads to maintain intensity without signs/symptoms of physical distress. Continue to progress workloads to maintain intensity without signs/symptoms of physical distress. -- Continue to progress workloads to maintain intensity without signs/symptoms of physical distress. Continue to progress workloads to maintain intensity without signs/symptoms of physical distress.   Average METs 3.7 -- -- -- --     Resistance Training   Training Prescription Yes Yes -- Yes Yes   Weight  blue bands blue bands -- blue bands blue bands   Reps 10-15 10-15 -- 10-15 10-15   Time 10 Minutes 10 Minutes -- 10 Minutes 10 Minutes     Recumbant Bike   Level 2 2 -- 3 3   RPM 60 52 -- 51 51   Watts -- -- -- 26 25   Minutes 15 15 -- 15 15   METs 2.6 2.2 -- 2.9 2.8     Recumbant Elliptical   Level 2 3 -- 3 4   RPM -- -- -- 58 --   Watts -- -- -- 77 --   Minutes 15 15 -- 15 15   METs 3.7 3.8 -- 4.8 4.5     Home Exercise Plan   Plans to continue exercise at -- -- Home (comment)  walking at [employer] -- --   Frequency -- -- Add 2 additional days to program exercise sessions. -- --   Initial Home Exercises Provided -- -- 08/26/23 -- --    Row Name 09/28/23 1500 10/12/23 1400 10/21/23 1226 11/09/23 1400 11/23/23 1500     Response to Exercise   Blood Pressure (Admit) 132/64 112/70 148/88 120/70 126/72   Blood Pressure (Exercise) 130/70 144/76 -- 152/88 172/80   Blood Pressure (Exit) 104/64 116/68 122/70 108/78 112/68   Heart Rate (Admit) 69 bpm 70 bpm 69 bpm 80 bpm 64 bpm   Heart Rate (Exercise) 110 bpm 124 bpm 119 bpm 130 bpm 118 bpm   Heart Rate (Exit) 85 bpm 95 bpm 95 bpm 95 bpm 91 bpm   Oxygen Saturation (Admit) 95  % 96 % 69 % 96 % 95 %   Oxygen Saturation (Exercise) 94 % 96 % 119 % 94 % 92 %   Oxygen Saturation (Exit) 94 % 91 % 95 % 94 % 92 %   Rating of Perceived Exertion (Exercise) 11 11 11 11 11    Perceived Dyspnea (Exercise) 3 2 2 2 2    Duration Continue with 30 min of aerobic exercise without signs/symptoms of physical distress. Continue with 30 min of aerobic exercise without signs/symptoms of physical distress. Continue with 30 min of aerobic exercise without signs/symptoms of physical distress. Continue with 30 min of aerobic exercise without signs/symptoms of physical distress. Continue with 30 min of aerobic exercise without signs/symptoms of physical distress.   Intensity THRR unchanged THRR unchanged THRR unchanged THRR unchanged THRR unchanged     Progression   Progression Continue to progress workloads to maintain intensity without signs/symptoms of physical distress. Continue to progress workloads to maintain intensity without signs/symptoms of physical distress. Continue to progress workloads to maintain intensity without signs/symptoms of physical distress. Continue to progress workloads to maintain intensity without signs/symptoms of physical distress. Continue to progress workloads to maintain intensity without signs/symptoms of physical distress.     Resistance Training   Training Prescription Yes Yes Yes Yes Yes   Weight blue bands blue bands blue bands blue bands blue bands   Reps 10-15 10-15 10-15 10-15 10-15   Time 10 Minutes 10 Minutes 10 Minutes 10 Minutes 10 Minutes     Recumbant Bike   Level 3 4 4 4 4    Minutes 15 15 15 15 15    METs 2.9 3.3 3.6 3.7 4.2     Recumbant Elliptical   Level 4 5 5 5 5    Minutes 15 15 15 15 15    METs 3.4 4.9 4.7 5 5.2  Exercise Comments:   Exercise Comments     Row Name 07/22/23 1509 08/26/23 1534         Exercise Comments Pt completed first day of group exercise. She exercised on recumbent bike for 15 min, level 1, METs  1.9. She then exercised on the recumbent elliptical, level 1, METs 3.1 for 15 min. Pt tolerated well. Able to perform warm up and cool down exercises with chair support. Discussed METs with good restrictions. Discussed with pt home exercise plan. She is not currently exercising at home but wants to start so she can get ready for a trip to [place] in a couple weeks. Discussed starting to walk at [facility] hardware or other large store using a cane. Encouraged starting with 15 min and building up to 30 min, 1-2 nonrehab days a week. She is agreeable.               Exercise Goals and Review:   Exercise Goals     Row Name 07/16/23 1310             Exercise Goals   Increase Physical Activity Yes       Intervention Provide advice, education, support and counseling about physical activity/exercise needs.;Develop an individualized exercise prescription for aerobic and resistive training based on initial evaluation findings, risk stratification, comorbidities and participant's personal goals.       Expected Outcomes Short Term: Attend rehab on a regular basis to increase amount of physical activity.;Long Term: Exercising regularly at least 3-5 days a week.;Long Term: Add in home exercise to make exercise part of routine and to increase amount of physical activity.       Increase Strength and Stamina Yes       Intervention Provide advice, education, support and counseling about physical activity/exercise needs.;Develop an individualized exercise prescription for aerobic and resistive training based on initial evaluation findings, risk stratification, comorbidities and participant's personal goals.       Expected Outcomes Short Term: Increase workloads from initial exercise prescription for resistance, speed, and METs.;Short Term: Perform resistance training exercises routinely during rehab and add in resistance training at home;Long Term: Improve cardiorespiratory fitness, muscular endurance and strength as  measured by increased METs and functional capacity ( )       Able to understand and use rate of perceived exertion (RPE) scale Yes       Intervention Provide education and explanation on how to use RPE scale       Expected Outcomes Short Term: Able to use RPE daily in rehab to express subjective intensity level;Long Term:  Able to use RPE to guide intensity level when exercising independently       Able to understand and use Dyspnea scale Yes       Intervention Provide education and explanation on how to use Dyspnea scale       Expected Outcomes Short Term: Able to use Dyspnea scale daily in rehab to express subjective sense of shortness of breath during exertion;Long Term: Able to use Dyspnea scale to guide intensity level when exercising independently       Knowledge and understanding of Target Heart Rate Range (THRR) Yes       Intervention Provide education and explanation of THRR including how the numbers were predicted and where they are located for reference       Expected Outcomes Short Term: Able to state/look up THRR;Short Term: Able to use daily as guideline for intensity in rehab;Long Term: Able to use THRR to  govern intensity when exercising independently       Understanding of Exercise Prescription Yes       Intervention Provide education, explanation, and written materials on patient's individual exercise prescription       Expected Outcomes Short Term: Able to explain program exercise prescription;Long Term: Able to explain home exercise prescription to exercise independently                Exercise Goals Re-Evaluation :  Exercise Goals Re-Evaluation     Row Name 07/27/23 0932 08/27/23 1125 09/21/23 0817 10/14/23 0851 11/16/23 0836     Exercise Goal Re-Evaluation   Exercise Goals Review Increase Physical Activity;Able to understand and use Dyspnea scale;Understanding of Exercise Prescription;Increase Strength and Stamina;Knowledge and understanding of Target Heart Rate  Range (THRR);Able to understand and use rate of perceived exertion (RPE) scale Increase Physical Activity;Able to understand and use Dyspnea scale;Understanding of Exercise Prescription;Increase Strength and Stamina;Knowledge and understanding of Target Heart Rate Range (THRR);Able to understand and use rate of perceived exertion (RPE) scale Increase Physical Activity;Able to understand and use Dyspnea scale;Understanding of Exercise Prescription;Increase Strength and Stamina;Knowledge and understanding of Target Heart Rate Range (THRR);Able to understand and use rate of perceived exertion (RPE) scale Increase Physical Activity;Able to understand and use Dyspnea scale;Understanding of Exercise Prescription;Increase Strength and Stamina;Knowledge and understanding of Target Heart Rate Range (THRR);Able to understand and use rate of perceived exertion (RPE) scale Increase Physical Activity;Able to understand and use Dyspnea scale;Understanding of Exercise Prescription;Increase Strength and Stamina;Knowledge and understanding of Target Heart Rate Range (THRR);Able to understand and use rate of perceived exertion (RPE) scale   Comments Pt has completed one exercise session. She exercised on recumbent bike for 15 min, level 1, METs 1.9. She then exercised on the recumbent elliptical, level 1, METs 3.1 for 15 min. Pt tolerated well. Able to perform warm up and cool down exercises with chair support. Will progress pt as tolerated. Pt has completed 8 exercise session, missing 1. She is exercising on recumbent bike for 15 min, level 3, METs 2.7. She then is exercising on the recumbent elliptical, level 3, METs 3.3 for 15 min. She has progressed well. She is going to [place] in a few weeks and wants to increase her walking ability therefore attempted to walk on the treadmill the last session. However she felt the treadmill too fast at 1.0 mph and did not feel safe. Therefore discussed home exercise and walking at home to  increase her ability in preparation for her trip. She is no longer requiring chair support for cool down exercises but is still on red bands. Will continue to progress. Pt has completed 13 exercise sessions but has been on vacation for the last 3 sessions and she will miss 2 more. She is exercising on recumbent bike for 15 min, level 3, METs 2.8. She then is exercising on the recumbent elliptical, level 4, METs 4.5 for 15 min. She has progressed well, pushing herself to prepare for her trip to [place] that included significant ambulation. She is still using red bands. Will progress as tolerated when she returns. Pt has completed 15 exercise sessions missing 3 weeks recently due to vacation. She returned from vacation without setbacks and is continuing to progress in class. She is exercising on recumbent bike for 15 min, level 4, METs 3.3. She then is exercising on the recumbent elliptical, level 5, METs 4.9 for 15 min. She has progressed now to blue bands. Will progress as tolerated, she is scheduled  for graduation 1/9. Pt has completed 22 exercise sessions with exceptional attendance minus the 3 weeks missed due to vacation.  She is exercising on recumbent bike for 15 min, level 4, METs 3.7. She then is exercising on the recumbent elliptical, level 5, METs 5.6 for 15 min. She is pushing herself and is now sweating consistently with exercise, her increase in METs shows. She is using 5.8 lb blue bands. She will graduate next week the 23rd (asked to be extended last month).   Expected Outcomes Through exercise at rehab and home, the patient will decrease shortness of breath with daily activities and feel confident in carrying out an exercise regimen at home Through exercise at rehab and home, the patient will decrease shortness of breath with daily activities and feel confident in carrying out an exercise regimen at home Through exercise at rehab and home, the patient will decrease shortness of breath with daily  activities and feel confident in carrying out an exercise regimen at home Through exercise at rehab and home, the patient will decrease shortness of breath with daily activities and feel confident in carrying out an exercise regimen at home Through exercise at rehab and home, the patient will decrease shortness of breath with daily activities and feel confident in carrying out an exercise regimen at home            Discharge Exercise Prescription (Final Exercise Prescription Changes):  Exercise Prescription Changes - 11/23/23 1500       Response to Exercise   Blood Pressure (Admit) 126/72    Blood Pressure (Exercise) 172/80    Blood Pressure (Exit) 112/68    Heart Rate (Admit) 64 bpm    Heart Rate (Exercise) 118 bpm    Heart Rate (Exit) 91 bpm    Oxygen Saturation (Admit) 95 %    Oxygen Saturation (Exercise) 92 %    Oxygen Saturation (Exit) 92 %    Rating of Perceived Exertion (Exercise) 11    Perceived Dyspnea (Exercise) 2    Duration Continue with 30 min of aerobic exercise without signs/symptoms of physical distress.    Intensity THRR unchanged      Progression   Progression Continue to progress workloads to maintain intensity without signs/symptoms of physical distress.      Resistance Training   Training Prescription Yes    Weight blue bands    Reps 10-15    Time 10 Minutes      Recumbant Bike   Level 4    Minutes 15    METs 4.2      Recumbant Elliptical   Level 5    Minutes 15    METs 5.2             Nutrition:  Target Goals: Understanding of nutrition guidelines, daily intake of sodium 1500mg , cholesterol 200mg , calories 30% from fat and 7% or less from saturated fats, daily to have 5 or more servings of fruits and vegetables.  Biometrics:  Pre Biometrics - 07/16/23 1305       Pre Biometrics   Grip Strength 10 kg             Post Biometrics - 11/18/23 1511        Post  Biometrics   Grip Strength 14 kg             Nutrition  Therapy Plan and Nutrition Goals:  Nutrition Therapy & Goals - 09/23/23 1158       Nutrition Therapy   Diet Heart  Healthy Diet    Drug/Food Interactions Statins/Certain Fruits      Personal Nutrition Goals   Nutrition Goal Patient to improve diet quality by using the plate method as a guide for meal planning to include lean protein/plant protein, fruits, vegetables, whole grains, nonfat dairy as part of a well-balanced diet.   goal in progress.   Personal Goal #2 Patient to identify strategies for weight loss of 0.5-2.0# per week.   goal in progress.   Comments Goal in progress. Taneyah has not attended pulmonary rehab since 11/5 due to vacation. Raegann reports motivation to lose weight with goal weight of 100#.  She reports craving sweets/carbohydrates and over-snacking at night. She is down 3.3# since starting with our program. Her lipid panel remains elevated. Patient will benefit from participation in pulmonary rehab for nutrition, exercise, and lifestyle modification.      Intervention Plan   Intervention Prescribe, educate and counsel regarding individualized specific dietary modifications aiming towards targeted core components such as weight, hypertension, lipid management, diabetes, heart failure and other comorbidities.;Nutrition handout(s) given to patient.    Expected Outcomes Short Term Goal: Understand basic principles of dietary content, such as calories, fat, sodium, cholesterol and nutrients.;Long Term Goal: Adherence to prescribed nutrition plan.             Nutrition Assessments:  Nutrition Assessments - 11/16/23 1602       Rate Your Plate Scores   Post Score 46            MEDIFICTS Score Key: >=70 Need to make dietary changes  40-70 Heart Healthy Diet <= 40 Therapeutic Level Cholesterol Diet  Flowsheet Row PULMONARY REHAB OTHER RESPIRATORY from 07/22/2023 in [facility]  Picture Your Plate Total Score  on Admission 54      Picture Your Plate Scores: <16 Unhealthy dietary pattern with much room for improvement. 41-50 Dietary pattern unlikely to meet recommendations for good health and room for improvement. 51-60 More healthful dietary pattern, with some room for improvement.  >60 Healthy dietary pattern, although there may be some specific behaviors that could be improved.    Nutrition Goals Re-Evaluation:  Nutrition Goals Re-Evaluation     Row Name 07/30/23 1434 08/26/23 1454 09/23/23 1158         Goals   Current Weight 134 lb 14.7 oz (61.2 kg) 130 lb 15.3 oz (59.4 kg) 131 lb 9.8 oz (59.7 kg)     Comment LDL 168, cholesterol 241 no new labs; most recent labs  LDL 168, cholesterol 241 (lipitor) no new labs; most recent labs LDL 168, cholesterol 241 (lipitor)     Expected Outcome Moon reports motivation to lose weight with goal weight of 100#. She reports craving sweets/carbohydrates and over-snacking at night. Her lipid panel remains elevated. Patient will benefit from participation in pulmonary rehab for nutrition, exercise, and lifestyle modification. Goal in progress. Lyrics reports motivation to lose weight with goal weight of 100#. She reports craving sweets/carbohydrates and over-snacking at night. She is down 4# since starting with our program. Her lipid panel remains elevated. Patient will benefit from participation in pulmonary rehab for nutrition, exercise, and lifestyle modification. Goal in progress. Jewelya has not attended pulmonary rehab since 11/5 due to vacation. Tijera reports motivation to lose weight with goal weight of 100#. She reports craving sweets/carbohydrates and over-snacking at night. She is down 3.3# since starting with our program. Her lipid panel remains elevated. Patient will benefit from participation  in pulmonary rehab for nutrition, exercise, and lifestyle modification.              Nutrition Goals Discharge (Final Nutrition Goals Re-Evaluation):   Nutrition Goals Re-Evaluation - 09/23/23 1158       Goals   Current Weight 131 lb 9.8 oz (59.7 kg)    Comment no new labs; most recent labs LDL 168, cholesterol 241 (lipitor)    Expected Outcome Goal in progress. Caprisha has not attended pulmonary rehab since 11/5 due to vacation. Deann reports motivation to lose weight with goal weight of 100#. She reports craving sweets/carbohydrates and over-snacking at night. She is down 3.3# since starting with our program. Her lipid panel remains elevated. Patient will benefit from participation in pulmonary rehab for nutrition, exercise, and lifestyle modification.             Psychosocial: Target Goals: Acknowledge presence or absence of significant depression and/or stress, maximize coping skills, provide positive support system. Participant is able to verbalize types and ability to use techniques and skills needed for reducing stress and depression.  Initial Review & Psychosocial Screening:  Initial Psych Review & Screening - 07/16/23 1312       Initial Review   Current issues with Current Sleep Concerns;Current Stress Concerns    Source of Stress Concerns Unable to participate in former interests or hobbies      Family Dynamics   Good Support System? Yes    Comments Supportive husband      Barriers   Psychosocial barriers to participate in program The patient should benefit from training in stress management and relaxation.      Screening Interventions   Interventions Encouraged to exercise;Provide feedback about the scores to participant    Expected Outcomes Short Term goal: Utilizing psychosocial counselor, staff and physician to assist with identification of specific Stressors or current issues interfering with healing process. Setting desired goal for each stressor or current issue identified.;Long Term Goal: Stressors or current issues are controlled or eliminated.;Short Term goal: Identification and review with participant of any  Quality of Life or Depression concerns found by scoring the questionnaire.;Long Term goal: The participant improves quality of Life and PHQ9 Scores as seen by post scores and/or verbalization of changes             Quality of Life Scores:  Scores of 19 and below usually indicate a poorer quality of life in these areas.  A difference of  2-3 points is a clinically meaningful difference.  A difference of 2-3 points in the total score of the Quality of Life Index has been associated with significant improvement in overall quality of life, self-image, physical symptoms, and general health in studies assessing change in quality of life.  PHQ-9: Review Flowsheet  More data exists      11/16/2023 07/16/2023 12/29/2022 12/07/2022 08/28/2022  Depression screen PHQ 2/9  Decreased Interest 0 0 0 0 0  Down, Depressed, Hopeless 0 0 0 0 0  PHQ - 2 Score 0 0 0 0 0  Altered sleeping 1 2 3 2  -  Tired, decreased energy 1 1 2 2  -  Change in appetite 1 1 0 1 -  Feeling bad or failure about yourself  0 0 0 0 -  Trouble concentrating 0 0 0 0 -  Moving slowly or fidgety/restless 0 0 0 0 -  Suicidal thoughts 0 0 0 0 -  PHQ-9 Score 3 4 5 5  -  Difficult doing work/chores Not difficult at  all Somewhat difficult Somewhat difficult Not difficult at all -   Interpretation of Total Score  Total Score Depression Severity:  1-4 = Minimal depression, 5-9 = Mild depression, 10-14 = Moderate depression, 15-19 = Moderately severe depression, 20-27 = Severe depression   Psychosocial Evaluation and Intervention:  Psychosocial Evaluation - 07/16/23 1316       Psychosocial Evaluation & Interventions   Interventions Encouraged to exercise with the program and follow exercise prescription    Comments Stepanie is not sleeping well. She has tried Rx meds in the past but couldn't tolerate. She is hoping exercise will help with sleep. Emillia is also discouraged that she doesn't have enough energy to walk far distances. 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    Expected Outcomes For Cedrina to particiapte in [place] rehab with decreased stress related to her lung disease and more quality restful sleep.    Continue Psychosocial Services  No Follow up required             Psychosocial Re-Evaluation:  Psychosocial Re-Evaluation     Row Name 07/28/23 1128 08/27/23 1320 09/21/23 0854 10/20/23 0952 11/15/23 0900     Psychosocial Re-Evaluation   Current issues with Current Sleep Concerns;Current Stress Concerns Current Sleep Concerns;Current Stress Concerns Current Sleep Concerns;Current Stress Concerns Current Sleep Concerns Current Sleep Concerns   Comments No changes since orientation. Sable has completed 1 session. Faizah still endorses concern about her sleep habits. We have encouraged Harriett Sine to implement a sleep routine and stick to it, put up electronics at least an hour before bed, and go to bed around the same time each night. She stated that she does get more restful sleep since starting [place]. She is both excited and concerned about her vacation coming up, in [place]. She states there will be a lot of walking and she's not sure if she is going to be able to keep up with her family. We have started getting Ailleen ready to go on the trip with increased walking during rehab. She is also walking at home. We have encouraged Thatiana that while she's on vacation to take breaks and listen to her body. She declines any psychosocial needs at this time. Lattie has not been to [employer] since 11/5. She is currently on vacation to [place] and [place]. She was concerned about her trip, with all the walking she would be doing, and if she could keep up with her family. Staff gave Harriett Sine encouragement since she has made great progress so far in the program. We also educated on self-pacing, listening to her body, and performing purse lipped breathing. Apryl denies any psy/soc  needs during her monthly psychosocial evaluation. She thoroughly enjoyed her vacation to [place] and [place]. She was able to walk without much difficulty. She stated she paced herself and took breaks. Amelah stated that if she wasn't in Pulm Rehab she would've never been able to go on vacation and do all the walking it took. Khalea denies any psy/soc needs during her monthly psychosocial evaluation. She states her sleep pattern hasn't changed, "it's been like this for the last 30 years", and she states she has just learned to accept it. She states she hasn't spoke to her MD about it, she doesn't want to take sleeping meds.   Expected Outcomes For Pabla to continue to attend pulmonary rehab and to have a positive outlook and good coping skills to  manage her stress. To get better quality of sleep. For Nilka to continue to attend pulmonary rehab and to have a positive outlook and good coping skills to manage her stress. To get better quality of sleep. For Maricza to continue to attend pulmonary rehab and to have a positive outlook and good coping skills to manage her stress. For Kiowa to continue to attend pulmonary rehab and to have a positive outlook and good coping skills to manage her stress. For Harriett Sine to get better, more restful sleep. For Roxsana to continue to attend pulmonary rehab and get better, more restful sleep.   Interventions Encouraged to attend Pulmonary Rehabilitation for the exercise;Stress management education Encouraged to attend Pulmonary Rehabilitation for the exercise;Stress management education Encouraged to attend Pulmonary Rehabilitation for the exercise Encouraged to attend Pulmonary Rehabilitation for the exercise Encouraged to attend Pulmonary Rehabilitation for the exercise   Continue Psychosocial Services  No Follow up required No Follow up required No Follow up required No Follow up required No Follow up required            Psychosocial Discharge (Final Psychosocial  Re-Evaluation):  Psychosocial Re-Evaluation - 11/15/23 0900       Psychosocial Re-Evaluation   Current issues with Current Sleep Concerns    Comments Fiorella denies any psy/soc needs during her monthly psychosocial evaluation. She states her sleep pattern hasn't changed, "it's been like this for the last 30 years", and she states she has just learned to accept it. She states she hasn't spoke to her MD about it, she doesn't want to take sleeping meds.    Expected Outcomes For Malayah to continue to attend pulmonary rehab and get better, more restful sleep.    Interventions Encouraged to attend Pulmonary Rehabilitation for the exercise    Continue Psychosocial Services  No Follow up required             Education: Education Goals: Education classes will be provided on a weekly basis, covering required topics. Participant will state understanding/return demonstration of topics presented.  Learning Barriers/Preferences:  Learning Barriers/Preferences - 07/16/23 1356       Learning Barriers/Preferences   Learning Barriers None    Learning Preferences Verbal Instruction;Written Material;Video;Individual Instruction             Education Topics: Know Your Numbers Group instruction that is supported by a PowerPoint presentation. Instructor discusses importance of knowing and understanding resting, exercise, and post-exercise oxygen saturation, heart rate, and blood pressure. Oxygen saturation, heart rate, blood pressure, rating of perceived exertion, and dyspnea are reviewed along with a normal range for these values.  Flowsheet Row PULMONARY REHAB OTHER RESPIRATORY from 11/04/2023 in [facility] for Heart, Vascular, & Lung Health  Date 11/04/23  Educator EP  Instruction Review Code 1- Verbalizes Understanding       Exercise for the Pulmonary Patient Group instruction that is supported by a PowerPoint presentation. Instructor discusses benefits of exercise,  core components of exercise, frequency, duration, and intensity of an exercise routine, importance of utilizing pulse oximetry during exercise, safety while exercising, and options of places to exercise outside of rehab.  Flowsheet Row PULMONARY REHAB OTHER RESPIRATORY from 07/29/2023 in [facility] for Heart, Vascular, & Lung Health  Date 07/29/23  Educator EP  Instruction Review Code 1- Verbalizes Understanding       MET Level  Group instruction provided by PowerPoint, verbal discussion, and written material to support subject matter. Instructor reviews what METs are and  how to increase METs.    Pulmonary Medications Verbally interactive group education provided by instructor with focus on inhaled medications and proper administration. Flowsheet Row PULMONARY REHAB OTHER RESPIRATORY from 07/22/2023 in [facility] for Heart, Vascular, & Lung Health  Date 07/22/23  Educator RT  Instruction Review Code 1- Verbalizes Understanding       Anatomy and Physiology of the Respiratory System Group instruction provided by PowerPoint, verbal discussion, and written material to support subject matter. Instructor reviews respiratory cycle and anatomical components of the respiratory system and their functions. Instructor also reviews differences in obstructive and restrictive respiratory diseases with examples of each.  Flowsheet Row PULMONARY REHAB OTHER RESPIRATORY from 10/14/2023 in [facility] for Heart, Vascular, & Lung Health  Date 10/14/23  Educator RT  Instruction Review Code 1- Verbalizes Understanding       Oxygen Safety Group instruction provided by PowerPoint, verbal discussion, and written material to support subject matter. There is an overview of "What is Oxygen" and "Why do we need it".  Instructor also reviews how to create a safe environment for oxygen use, the importance of using oxygen as prescribed, and  the risks of noncompliance. There is a brief discussion on traveling with oxygen and resources the patient may utilize. Flowsheet Row PULMONARY REHAB OTHER RESPIRATORY from 11/11/2023 in [facility] for Heart, Vascular, & Lung Health  Date 11/11/23  Educator RN  Instruction Review Code 1- Verbalizes Understanding       Oxygen Use Group instruction provided by PowerPoint, verbal discussion, and written material to discuss how supplemental oxygen is prescribed and different types of oxygen supply systems. Resources for more information are provided.  Flowsheet Row PULMONARY REHAB OTHER RESPIRATORY from 11/18/2023 in [facility] for Heart, Vascular, & Lung Health  Date 11/18/23  Educator RT  Instruction Review Code 1- Verbalizes Understanding       Breathing Techniques Group instruction that is supported by demonstration and informational handouts. Instructor discusses the benefits of pursed lip and diaphragmatic breathing and detailed demonstration on how to perform both.  Flowsheet Row PULMONARY REHAB OTHER RESPIRATORY from 08/26/2023 in [facility] for Heart, Vascular, & Lung Health  Date 08/26/23  Educator RN  Instruction Review Code 1- Verbalizes Understanding        Risk Factor Reduction Group instruction that is supported by a PowerPoint presentation. Instructor discusses the definition of a risk factor, different risk factors for pulmonary disease, and how the heart and lungs work together.   Pulmonary Diseases Group instruction provided by PowerPoint, verbal discussion, and written material to support subject matter. Instructor gives an overview of the different type of pulmonary diseases. There is also a discussion on risk factors and symptoms as well as ways to manage the diseases. Flowsheet Row PULMONARY REHAB OTHER RESPIRATORY from 10/07/2023 in [facility] for Heart, Vascular, &  Lung Health  Date 10/07/23  Educator RT  Instruction Review Code 1- Verbalizes Understanding       Stress and Energy Conservation Group instruction provided by PowerPoint, verbal discussion, and written material to support subject matter. Instructor gives an overview of stress and the impact it can have on the body. Instructor also reviews ways to reduce stress. There is also a discussion on energy conservation and ways to conserve energy throughout the day. Flowsheet Row PULMONARY REHAB OTHER RESPIRATORY from 09/02/2023 in [facility] for Heart, Vascular, &  Lung Health  Date 09/02/23  Educator RN  Instruction Review Code 1- Verbalizes Understanding       Warning Signs and Symptoms Group instruction provided by PowerPoint, verbal discussion, and written material to support subject matter. Instructor reviews warning signs and symptoms of stroke, heart attack, cold and flu. Instructor also reviews ways to prevent the spread of infection.   Other Education Group or individual verbal, written, or video instructions that support the educational goals of the pulmonary rehab program.    Knowledge Questionnaire Score:  Knowledge Questionnaire Score - 11/16/23 1600       Knowledge Questionnaire Score   Post Score 18/18             Core Components/Risk Factors/Patient Goals at Admission:  Personal Goals and Risk Factors at Admission - 07/16/23 1317       Core Components/Risk Factors/Patient Goals on Admission    Weight Management Yes;Weight Loss    Intervention Weight Management: Develop a combined nutrition and exercise program designed to reach desired caloric intake, while maintaining appropriate intake of nutrient and fiber, sodium and fats, and appropriate energy expenditure required for the weight goal.;Weight Management: Provide education and appropriate resources to help participant work on and attain dietary goals.;Weight Management/Obesity:  Establish reasonable short term and long term weight goals.;Obesity: Provide education and appropriate resources to help participant work on and attain dietary goals.    Expected Outcomes Short Term: Continue to assess and modify interventions until short term weight is achieved;Long Term: Adherence to nutrition and physical activity/exercise program aimed toward attainment of established weight goal;Weight Loss: Understanding of general recommendations for a balanced deficit meal plan, which promotes 1-2 lb weight loss per week and includes a negative energy balance of [phone] kcal/d;Understanding of distribution of calorie intake throughout the day with the consumption of 4-5 meals/snacks;Understanding recommendations for meals to include 15-35% energy as protein, 25-35% energy from fat, 35-60% energy from carbohydrates, less than 200mg  of dietary cholesterol, 20-35 gm of total fiber daily    Improve shortness of breath with ADL's Yes    Intervention Provide education, individualized exercise plan and daily activity instruction to help decrease symptoms of SOB with activities of daily living.    Expected Outcomes Short Term: Improve cardiorespiratory fitness to achieve a reduction of symptoms when performing ADLs;Long Term: Be able to perform more ADLs without symptoms or delay the onset of symptoms             Core Components/Risk Factors/Patient Goals Review:   Goals and Risk Factor Review     Row Name 07/28/23 1130 08/27/23 1331 09/21/23 0900 10/20/23 1005 11/15/23 0904     Core Components/Risk Factors/Patient Goals Review   Personal Goals Review Weight Management/Obesity;Improve shortness of breath with ADL's;Develop more efficient breathing techniques such as purse lipped breathing and diaphragmatic breathing and practicing self-pacing with activity. Weight Management/Obesity;Improve shortness of breath with ADL's;Develop more efficient breathing techniques such as purse lipped breathing  and diaphragmatic breathing and practicing self-pacing with activity. Weight Management/Obesity;Improve shortness of breath with ADL's;Develop more efficient breathing techniques such as purse lipped breathing and diaphragmatic breathing and practicing self-pacing with activity. Weight Management/Obesity;Improve shortness of breath with ADL's;Develop more efficient breathing techniques such as purse lipped breathing and diaphragmatic breathing and practicing self-pacing with activity. Weight Management/Obesity   Review Unable to assess. Jonay has completed 1 session. Goal in action for weight loss. Anya has lost ~4# since starting the program. She is working with our dietician on a plan of action  and is open to the ideas and suggestions. Goal progressing on developing more efficient breathing techniques such as purse lipped breathing and diaphragmatic breathing; and practicing self-pacing with activity. We are teaching Harriett Sine how and when to initiate PLB and listening to her body to self-pace herself while exercising. We have been working on this for her upcoming trip to [place]. Goal in progress on improving her shortness of breath with ADLs. She has increased both her workload and METs while maintaining her oxygen saturation >88% on room air. We will continue to monitor and assess Justiss's progress in the program. Dennie Bible will continue to benefit from PR for nutrition, education, exercise, and lifestyle modification. Cailynn has not been to [employer] since 11/5. She is currently on vacation to [place] and [place]. Goal in action for weight loss. Canary has lost ~3# since starting the program. She is working with our dietician on a plan of action and is open to the ideas and suggestions. Goal progressing on developing more efficient breathing techniques such as purse lipped breathing and diaphragmatic breathing; and practicing self-pacing with activity. We are teaching Harriett Sine how and when to initiate PLB and listening to her  body to self-pace herself while exercising. Goal met on improving her shortness of breath with ADLs. She has increased both her workload and METs while maintaining her oxygen saturation >88% on room air. We will continue to monitor and assess Leanza's progress in the program. Saleema will continue to benefit from PR for nutrition, education, exercise, and lifestyle modification. Monthly review of patient's Core Components/Risk Factors/Patient Goals are as follows: Goal in progress for weight loss. Linnet has lost ~1# since starting the program. She worked with our dietician on a plan of action and is open to ideas and suggestions. Her weight has been up and down during her time in the program. Goal met on developing more efficient breathing techniques such as purse lipped breathing and diaphragmatic breathing; and practicing self-pacing with activity. Abrianna knows how to initiate PLB and listen to her body to self-pace herself while exercising. Goal met on improving her shortness of breath with ADLs. She has increased both her workload and METs while maintaining her oxygen saturation >88% on room air. We will continue to monitor and assess Christiann's progress in the program. Danell will continue to benefit from PR for nutrition, education, exercise, and lifestyle modification. Monthly review of patient's Core Components/Risk Factors/Patient Goals are as follows: Goal in progress for weight loss. Kalliyah has lost ~2.6# since starting the program. She worked with our dietician on a plan of action and is open to ideas and suggestions. Her weight has been up and down during her time in the program, but she states she is "really dedicated" now that she is back from her vacation and the holidays are over. We will continue to monitor and assess Grier's progress in the program. Lorelei will continue to benefit from PR for nutrition, education, exercise, and lifestyle modification.   Expected Outcomes For Horizon to lose weight, improve  her shortness of breath with ADLs, develop more efficient breathing techniques, and learn how to self-pace. For Harriett Sine to lose weight, improve her shortness of breath with ADLs, develop more efficient breathing techniques, and learn how to self-pace. For Harriett Sine to lose weight, develop more efficient breathing techniques, and learn how to self-pace. For Harriett Sine to lose weight For Harriett Sine to lose weight            Core Components/Risk Factors/Patient Goals at Discharge (Final Review):  Goals and Risk Factor Review - 11/15/23 0904       Core Components/Risk Factors/Patient Goals Review   Personal Goals Review Weight Management/Obesity    Review Monthly review of patient's Core Components/Risk Factors/Patient Goals are as follows: Goal in progress for weight loss. Kameah has lost ~2.6# since starting the program. She worked with our dietician on a plan of action and is open to ideas and suggestions. Her weight has been up and down during her time in the program, but she states she is "really dedicated" now that she is back from her vacation and the holidays are over. We will continue to monitor and assess Shamya's progress in the program. Shaena will continue to benefit from PR for nutrition, education, exercise, and lifestyle modification.    Expected Outcomes For Angelly to lose weight             ITP Comments:Pt is making expected progress toward Pulmonary Rehab goals after completing 25 session(s). Recommend continued exercise, life style modification, education, and utilization of breathing techniques to increase stamina and strength, while also decreasing shortness of breath with exertion.  Dr. Mechele Collin is Medical Director for Pulmonary Rehab at [facility].

## 2023-11-25 ENCOUNTER — Other Ambulatory Visit (HOSPITAL_BASED_OUTPATIENT_CLINIC_OR_DEPARTMENT_OTHER): Payer: Self-pay

## 2023-11-25 ENCOUNTER — Encounter (HOSPITAL_COMMUNITY)
Admission: RE | Admit: 2023-11-25 | Discharge: 2023-11-25 | Disposition: A | Discharge status: Home / Self Care | Payer: PPO | Source: Ambulatory Visit | Attending: Pulmonary Disease | Admitting: Pulmonary Disease

## 2023-11-25 DIAGNOSIS — R0602 Shortness of breath: Secondary | ICD-10-CM

## 2023-11-25 NOTE — Progress Notes (Signed)
Daily Session Note  Patient Details  Name: Julie [name] MRN: [id number] Date of Birth: 18-Sep-1941 Referring Provider:   Doristine Devoid Pulmonary Rehab Walk Test from 07/16/2023 in [facility]  Referring Provider Everardo All       Encounter Date: 11/25/2023  Check In:  Session Check In - 11/25/23 1342       Check-In   Supervising physician immediately available to respond to emergencies [facility] MD immediately available    Physician(s) Robin Searing, NP    Location [facility]    Staff Present [name], RN, BSN;Lynnita Somma Charlean Sanfilippo, MS, ACSM-CEP, Exercise Physiologist    Virtual Visit No    Medication changes reported     No    Fall or balance concerns reported    No    Tobacco Cessation No Change    Warm-up and Cool-down Performed as group-led instruction    Resistance Training Performed Yes    VAD Patient? No    PAD/SET Patient? No      Pain Assessment   Currently in Pain? No/denies    Multiple Pain Sites No             Capillary Blood Glucose: No results found for this or any previous visit (from the past 24 hours).    Social History   Tobacco Use  Smoking Status Never   Passive exposure: Past  Smokeless Tobacco Never    Goals Met:  Proper associated with RPD/PD & O2 Sat Independence with exercise equipment Exercise tolerated well No report of concerns or symptoms today Strength training completed today  Goals Unmet:  Not Applicable  Comments: Service time is from 1317 to 1442.    Dr. Mechele Collin is Medical Director for Pulmonary Rehab at [facility].

## 2023-11-26 NOTE — Progress Notes (Signed)
Discharge Progress Report  Patient Details  Name: Julie [name] MRN: [id number] Date of Birth: 10/12/1941 Referring Provider:   Doristine Devoid Pulmonary Rehab Walk Test from 07/16/2023 in [facility]  Referring Provider Everardo All        Number of Visits: 27  Reason for Discharge:  Patient has met program and personal goals.  Smoking History:  Social History   Tobacco Use  Smoking Status Never   Passive exposure: Past  Smokeless Tobacco Never    Diagnosis:  Shortness of breath  ADL UCSD:  Pulmonary Assessment Scores     Row Name 07/16/23 1341 11/16/23 1600 11/18/23 1511     ADL UCSD   ADL Phase Entry Exit Exit   SOB Score total 60 41 --     CAT Score   CAT Score 19 12 --     mMRC Score   mMRC Score 4 -- 2            Initial Exercise Prescription:  Initial Exercise Prescription - 07/16/23 1400       Date of Initial Exercise RX and Referring Provider   Date 07/16/23    Referring Provider Everardo All    Expected Discharge Date 10/14/23      Recumbant Bike   Level 1    Minutes 15    METs 1.5      Recumbant Elliptical   Level 1    Minutes 15    METs 2      Prescription Details   Frequency (times per week) 2    Duration Progress to 30 minutes of continuous aerobic without signs/symptoms of physical distress      Intensity   THRR 40-80% of Max Heartrate 55-110    Ratings of Perceived Exertion 11-13    Perceived Dyspnea 0-4      Progression   Progression Continue to progress workloads to maintain intensity without signs/symptoms of physical distress.      Resistance Training   Training Prescription Yes    Weight red bands    Reps 10-15             Discharge Exercise Prescription (Final Exercise Prescription Changes):  Exercise Prescription Changes - 11/23/23 1500       Response to Exercise   Blood Pressure (Admit) 126/72    Blood Pressure (Exercise) 172/80    Blood  Pressure (Exit) 112/68    Heart Rate (Admit) 64 bpm    Heart Rate (Exercise) 118 bpm    Heart Rate (Exit) 91 bpm    Oxygen Saturation (Admit) 95 %    Oxygen Saturation (Exercise) 92 %    Oxygen Saturation (Exit) 92 %    Rating of Perceived Exertion (Exercise) 11    Perceived Dyspnea (Exercise) 2    Duration Continue with 30 min of aerobic exercise without signs/symptoms of physical distress.    Intensity THRR unchanged      Progression   Progression Continue to progress workloads to maintain intensity without signs/symptoms of physical distress.      Resistance Training   Training Prescription Yes    Weight blue bands    Reps 10-15    Time 10 Minutes      Recumbant Bike   Level 4    Minutes 15    METs 4.2      Recumbant Elliptical   Level 5    Minutes 15    METs 5.2  Functional Capacity:  6 Minute Walk     Row Name 07/16/23 1400 11/18/23 1505       6 Minute Walk   Phase Initial Discharge    Distance 823 feet 1140 feet    Distance % Change -- 38.52 %    Distance Feet Change -- 317 ft    Walk Time 6 minutes 6 minutes    # of Rest Breaks 0 0    MPH 1.56 2.16    METS 1.2 2.33    RPE 11 11    Perceived Dyspnea  1 2    VO2 Peak 4.19 8.15    Resting HR 81 bpm 71 bpm    Resting BP 130/72 144/74    Resting Oxygen Saturation  94 % 97 %    Exercise Oxygen Saturation  during 6 min walk 92 % 91 %    Max Ex. HR 105 bpm 119 bpm    Max Ex. BP 132/82 180/80    2 Minute Post BP 128/80 136/76      Interval HR   1 Minute HR 95 97    2 Minute HR 107 101    3 Minute HR 103 111    4 Minute HR 105 115    5 Minute HR 101 116    6 Minute HR 100 119    2 Minute Post HR 86 89    Interval Heart Rate? Yes --      Interval Oxygen   Interval Oxygen? Yes --    Baseline Oxygen Saturation % 94 % 97 %    1 Minute Oxygen Saturation % 93 % 97 %    1 Minute Liters of Oxygen 0 L 0 L    2 Minute Oxygen Saturation % 94 % 96 %    2 Minute Liters of Oxygen 0 L 0 L    3  Minute Oxygen Saturation % 97 % 96 %    3 Minute Liters of Oxygen 0 L 0 L    4 Minute Oxygen Saturation % 92 % 99 %    4 Minute Liters of Oxygen 0 L 0 L    5 Minute Oxygen Saturation % 94 % 91 %    5 Minute Liters of Oxygen 0 L 0 L    6 Minute Oxygen Saturation % 95 % 93 %    6 Minute Liters of Oxygen 0 L 0 L    2 Minute Post Oxygen Saturation % 97 % 99 %    2 Minute Post Liters of Oxygen 0 L 0 L             Psychological, QOL, Others - Outcomes: PHQ 2/9:    11/16/2023    3:58 PM 07/16/2023    1:12 PM 12/29/2022    4:53 PM 12/07/2022    3:16 PM 08/28/2022    7:42 AM  Depression screen PHQ 2/9  Decreased Interest 0 0 0 0 0  Down, Depressed, Hopeless 0 0 0 0 0  PHQ - 2 Score 0 0 0 0 0  Altered sleeping 1 2 3 2    Tired, decreased energy 1 1 2 2    Change in appetite 1 1 0 1   Feeling bad or failure about yourself  0 0 0 0   Trouble concentrating 0 0 0 0   Moving slowly or fidgety/restless 0 0 0 0   Suicidal thoughts 0 0 0 0   PHQ-9 Score 3 4 5  5  Difficult doing work/chores Not difficult at all Somewhat difficult Somewhat difficult Not difficult at all     Quality of Life:   Personal Goals: Goals established at orientation with interventions provided to work toward goal.  Personal Goals and Risk Factors at Admission - 07/16/23 1317       Core Components/Risk Factors/Patient Goals on Admission    Weight Management Yes;Weight Loss    Intervention Weight Management: Develop a combined nutrition and exercise program designed to reach desired caloric intake, while maintaining appropriate intake of nutrient and fiber, sodium and fats, and appropriate energy expenditure required for the weight goal.;Weight Management: Provide education and appropriate resources to help participant work on and attain dietary goals.;Weight Management/Obesity: Establish reasonable short term and long term weight goals.;Obesity: Provide education and appropriate resources to help participant work on  and attain dietary goals.    Expected Outcomes Short Term: Continue to assess and modify interventions until short term weight is achieved;Long Term: Adherence to nutrition and physical activity/exercise program aimed toward attainment of established weight goal;Weight Loss: Understanding of general recommendations for a balanced deficit meal plan, which promotes 1-2 lb weight loss per week and includes a negative energy balance of [phone] kcal/d;Understanding of distribution of calorie intake throughout the day with the consumption of 4-5 meals/snacks;Understanding recommendations for meals to include 15-35% energy as protein, 25-35% energy from fat, 35-60% energy from carbohydrates, less than 200mg  of dietary cholesterol, 20-35 gm of total fiber daily    Improve shortness of breath with ADL's Yes    Intervention Provide education, individualized exercise plan and daily activity instruction to help decrease symptoms of SOB with activities of daily living.    Expected Outcomes Short Term: Improve cardiorespiratory fitness to achieve a reduction of symptoms when performing ADLs;Long Term: Be able to perform more ADLs without symptoms or delay the onset of symptoms              Personal Goals Discharge:  Goals and Risk Factor Review     Row Name 07/28/23 1130 08/27/23 1331 09/21/23 0900 10/20/23 1005 11/15/23 0904     Core Components/Risk Factors/Patient Goals Review   Personal Goals Review Weight Management/Obesity;Improve shortness of breath with ADL's;Develop more efficient breathing techniques such as purse lipped breathing and diaphragmatic breathing and practicing self-pacing with activity. Weight Management/Obesity;Improve shortness of breath with ADL's;Develop more efficient breathing techniques such as purse lipped breathing and diaphragmatic breathing and practicing self-pacing with activity. Weight Management/Obesity;Improve shortness of breath with ADL's;Develop more efficient breathing  techniques such as purse lipped breathing and diaphragmatic breathing and practicing self-pacing with activity. Weight Management/Obesity;Improve shortness of breath with ADL's;Develop more efficient breathing techniques such as purse lipped breathing and diaphragmatic breathing and practicing self-pacing with activity. Weight Management/Obesity   Review Unable to assess. Julie [name] has completed 1 session. Goal in action for weight loss. Julie [name] has lost ~4# since starting the program. She is working with our dietician on a plan of action and is open to the ideas and suggestions. Goal progressing on developing more efficient breathing techniques such as purse lipped breathing and diaphragmatic breathing; and practicing self-pacing with activity. We are teaching Julie [name] how and when to initiate PLB and listening to her body to self-pace herself while exercising. We have been working on this for her upcoming trip to [place]. Goal in progress on improving her shortness of breath with ADLs. She has increased both her workload and METs while maintaining her oxygen saturation >88% on room air. We will continue to monitor  and assess Julie [name]'s progress in the program. Julie [name] will continue to benefit from PR for nutrition, education, exercise, and lifestyle modification. Julie [name] has not been to [employer] since 11/5. She is currently on vacation to [place] and [place]. Goal in action for weight loss. Janney has lost ~3# since starting the program. She is working with our dietician on a plan of action and is open to the ideas and suggestions. Goal progressing on developing more efficient breathing techniques such as purse lipped breathing and diaphragmatic breathing; and practicing self-pacing with activity. We are teaching Julie [name] how and when to initiate PLB and listening to her body to self-pace herself while exercising. Goal met on improving her shortness of breath with ADLs. She has increased both her workload and METs while maintaining her  oxygen saturation >88% on room air. We will continue to monitor and assess Elif's progress in the program. Julie [name] will continue to benefit from PR for nutrition, education, exercise, and lifestyle modification. Monthly review of patient's Core Components/Risk Factors/Patient Goals are as follows: Goal in progress for weight loss. Julie [name] has lost ~1# since starting the program. She worked with our dietician on a plan of action and is open to ideas and suggestions. Her weight has been up and down during her time in the program. Goal met on developing more efficient breathing techniques such as purse lipped breathing and diaphragmatic breathing; and practicing self-pacing with activity. Julie [name] knows how to initiate PLB and listen to her body to self-pace herself while exercising. Goal met on improving her shortness of breath with ADLs. She has increased both her workload and METs while maintaining her oxygen saturation >88% on room air. We will continue to monitor and assess Julie [name]'s progress in the program. Julie [name] will continue to benefit from PR for nutrition, education, exercise, and lifestyle modification. Monthly review of patient's Core Components/Risk Factors/Patient Goals are as follows: Goal in progress for weight loss. Julie [name] has lost ~2.6# since starting the program. She worked with our dietician on a plan of action and is open to ideas and suggestions. Her weight has been up and down during her time in the program, but she states she is "really dedicated" now that she is back from her vacation and the holidays are over. We will continue to monitor and assess Julie [name]'s progress in the program. Julie [name] will continue to benefit from PR for nutrition, education, exercise, and lifestyle modification.   Expected Outcomes For Julie [name] to lose weight, improve her shortness of breath with ADLs, develop more efficient breathing techniques, and learn how to self-pace. For Julie [name] to lose weight, improve her shortness of breath  with ADLs, develop more efficient breathing techniques, and learn how to self-pace. For Julie [name] to lose weight, develop more efficient breathing techniques, and learn how to self-pace. For Julie [name] to lose weight For Julie [name] to lose weight    Row Name 11/26/23 0917             Core Components/Risk Factors/Patient Goals Review   Personal Goals Review Weight Management/Obesity;Improve shortness of breath with ADL's       Review Julie [name] graduated from the PR program on 11/25/23. Goal met for weight loss. Julie [name] is down 2#'s since starting the program. Goal met for improving shortness of breath with ADL's. Julie [name]'s SOB scale decreased from 60 to 41. Her MMRC decreased from 4 to  2. Naylin worked hard during her time in [place]. We wish her the best.       Expected Outcomes To continue to  exercise and modify her nutrition and lifestyle post graduation                Exercise Goals and Review:  Exercise Goals     Row Name 07/16/23 1310             Exercise Goals   Increase Physical Activity Yes       Intervention Provide advice, education, support and counseling about physical activity/exercise needs.;Develop an individualized exercise prescription for aerobic and resistive training based on initial evaluation findings, risk stratification, comorbidities and participant's personal goals.       Expected Outcomes Short Term: Attend rehab on a regular basis to increase amount of physical activity.;Long Term: Exercising regularly at least 3-5 days a week.;Long Term: Add in home exercise to make exercise part of routine and to increase amount of physical activity.       Increase Strength and Stamina Yes       Intervention Provide advice, education, support and counseling about physical activity/exercise needs.;Develop an individualized exercise prescription for aerobic and resistive training based on initial evaluation findings, risk stratification, comorbidities and participant's personal goals.       Expected  Outcomes Short Term: Increase workloads from initial exercise prescription for resistance, speed, and METs.;Short Term: Perform resistance training exercises routinely during rehab and add in resistance training at home;Long Term: Improve cardiorespiratory fitness, muscular endurance and strength as measured by increased METs and functional capacity ( )       Able to understand and use rate of perceived exertion (RPE) scale Yes       Intervention Provide education and explanation on how to use RPE scale       Expected Outcomes Short Term: Able to use RPE daily in rehab to express subjective intensity level;Long Term:  Able to use RPE to guide intensity level when exercising independently       Able to understand and use Dyspnea scale Yes       Intervention Provide education and explanation on how to use Dyspnea scale       Expected Outcomes Short Term: Able to use Dyspnea scale daily in rehab to express subjective sense of shortness of breath during exertion;Long Term: Able to use Dyspnea scale to guide intensity level when exercising independently       Knowledge and understanding of Target Heart Rate Range (THRR) Yes       Intervention Provide education and explanation of THRR including how the numbers were predicted and where they are located for reference       Expected Outcomes Short Term: Able to state/look up THRR;Short Term: Able to use daily as guideline for intensity in rehab;Long Term: Able to use THRR to govern intensity when exercising independently       Understanding of Exercise Prescription Yes       Intervention Provide education, explanation, and written materials on patient's individual exercise prescription       Expected Outcomes Short Term: Able to explain program exercise prescription;Long Term: Able to explain home exercise prescription to exercise independently                Exercise Goals Re-Evaluation:  Exercise Goals Re-Evaluation     Row Name 07/27/23 0932  08/27/23 1125 09/21/23 0817 10/14/23 0851 11/16/23 0836     Exercise Goal Re-Evaluation   Exercise Goals Review Increase Physical Activity;Able to understand and use Dyspnea scale;Understanding of Exercise Prescription;Increase Strength and Stamina;Knowledge and understanding of Target Heart Rate Range (THRR);Able to  understand and use rate of perceived exertion (RPE) scale Increase Physical Activity;Able to understand and use Dyspnea scale;Understanding of Exercise Prescription;Increase Strength and Stamina;Knowledge and understanding of Target Heart Rate Range (THRR);Able to understand and use rate of perceived exertion (RPE) scale Increase Physical Activity;Able to understand and use Dyspnea scale;Understanding of Exercise Prescription;Increase Strength and Stamina;Knowledge and understanding of Target Heart Rate Range (THRR);Able to understand and use rate of perceived exertion (RPE) scale Increase Physical Activity;Able to understand and use Dyspnea scale;Understanding of Exercise Prescription;Increase Strength and Stamina;Knowledge and understanding of Target Heart Rate Range (THRR);Able to understand and use rate of perceived exertion (RPE) scale Increase Physical Activity;Able to understand and use Dyspnea scale;Understanding of Exercise Prescription;Increase Strength and Stamina;Knowledge and understanding of Target Heart Rate Range (THRR);Able to understand and use rate of perceived exertion (RPE) scale   Comments Pt has completed one exercise session. She exercised on recumbent bike for 15 min, level 1, METs 1.9. She then exercised on the recumbent elliptical, level 1, METs 3.1 for 15 min. Pt tolerated well. Able to perform warm up and cool down exercises with chair support. Will progress pt as tolerated. Pt has completed 8 exercise session, missing 1. She is exercising on recumbent bike for 15 min, level 3, METs 2.7. She then is exercising on the recumbent elliptical, level 3, METs 3.3 for 15 min.  She has progressed well. She is going to [place] in a few weeks and wants to increase her walking ability therefore attempted to walk on the treadmill the last session. However she felt the treadmill too fast at 1.0 mph and did not feel safe. Therefore discussed home exercise and walking at home to increase her ability in preparation for her trip. She is no longer requiring chair support for cool down exercises but is still on red bands. Will continue to progress. Pt has completed 13 exercise sessions but has been on vacation for the last 3 sessions and she will miss 2 more. She is exercising on recumbent bike for 15 min, level 3, METs 2.8. She then is exercising on the recumbent elliptical, level 4, METs 4.5 for 15 min. She has progressed well, pushing herself to prepare for her trip to [place] that included significant ambulation. She is still using red bands. Will progress as tolerated when she returns. Pt has completed 15 exercise sessions missing 3 weeks recently due to vacation. She returned from vacation without setbacks and is continuing to progress in class. She is exercising on recumbent bike for 15 min, level 4, METs 3.3. She then is exercising on the recumbent elliptical, level 5, METs 4.9 for 15 min. She has progressed now to blue bands. Will progress as tolerated, she is scheduled for graduation 1/9. Pt has completed 22 exercise sessions with exceptional attendance minus the 3 weeks missed due to vacation.  She is exercising on recumbent bike for 15 min, level 4, METs 3.7. She then is exercising on the recumbent elliptical, level 5, METs 5.6 for 15 min. She is pushing herself and is now sweating consistently with exercise, her increase in METs shows. She is using 5.8 lb blue bands. She will graduate next week the 23rd (asked to be extended last month).   Expected Outcomes Through exercise at rehab and home, the patient will decrease shortness of breath with daily activities and feel confident in carrying  out an exercise regimen at home Through exercise at rehab and home, the patient will decrease shortness of breath with daily activities and feel confident  in carrying out an exercise regimen at home Through exercise at rehab and home, the patient will decrease shortness of breath with daily activities and feel confident in carrying out an exercise regimen at home Through exercise at rehab and home, the patient will decrease shortness of breath with daily activities and feel confident in carrying out an exercise regimen at home Through exercise at rehab and home, the patient will decrease shortness of breath with daily activities and feel confident in carrying out an exercise regimen at home    Row Name 11/26/23 0919             Exercise Goal Re-Evaluation   Exercise Goals Review Increase Physical Activity;Able to understand and use Dyspnea scale;Understanding of Exercise Prescription;Increase Strength and Stamina;Knowledge and understanding of Target Heart Rate Range (THRR);Able to understand and use rate of perceived exertion (RPE) scale       Comments Julie [name] has completed 26 exercise sessions. Her peak METs were 4.2 on the recumbent bike and 5.4 on the recumbent elliptical. Julie [name] plans to return to [facility] in the future.       Expected Outcomes Through exercise at rehab and home, the patient will decrease shortness of breath with daily activities and feel confident in carrying out an exercise regimen at home                Nutrition & Weight - Outcomes:  Pre Biometrics - 07/16/23 1305       Pre Biometrics   Grip Strength 10 kg             Post Biometrics - 11/18/23 1511        Post  Biometrics   Grip Strength 14 kg             Nutrition:  Nutrition Therapy & Goals - 09/23/23 1158       Nutrition Therapy   Diet Heart Healthy Diet    Drug/Food Interactions Statins/Certain Fruits      Personal Nutrition Goals   Nutrition Goal Patient to improve diet quality by using the  plate method as a guide for meal planning to include lean protein/plant protein, fruits, vegetables, whole grains, nonfat dairy as part of a well-balanced diet.   goal in progress.   Personal Goal #2 Patient to identify strategies for weight loss of 0.5-2.0# per week.   goal in progress.   Comments Goal in progress. Fusae has not attended pulmonary rehab since 11/5 due to vacation. Nikisha reports motivation to lose weight with goal weight of 100#.  She reports craving sweets/carbohydrates and over-snacking at night. She is down 3.3# since starting with our program. Her lipid panel remains elevated. Patient will benefit from participation in pulmonary rehab for nutrition, exercise, and lifestyle modification.      Intervention Plan   Intervention Prescribe, educate and counsel regarding individualized specific dietary modifications aiming towards targeted core components such as weight, hypertension, lipid management, diabetes, heart failure and other comorbidities.;Nutrition handout(s) given to patient.    Expected Outcomes Short Term Goal: Understand basic principles of dietary content, such as calories, fat, sodium, cholesterol and nutrients.;Long Term Goal: Adherence to prescribed nutrition plan.             Nutrition Discharge:  Nutrition Assessments - 11/16/23 1602       Rate Your Plate Scores   Post Score 46             Education Questionnaire Score:  Knowledge Questionnaire Score - 11/16/23 1600  Knowledge Questionnaire Score   Post Score 18/18             Goals reviewed with patient; copy given to patient.

## 2023-12-14 ENCOUNTER — Encounter: Payer: PPO | Admitting: Family Medicine

## 2023-12-18 DIAGNOSIS — B349 Viral infection, unspecified: Secondary | ICD-10-CM | POA: Diagnosis not present

## 2023-12-18 DIAGNOSIS — R051 Acute cough: Secondary | ICD-10-CM | POA: Diagnosis not present

## 2023-12-23 ENCOUNTER — Other Ambulatory Visit: Payer: Self-pay | Admitting: Family Medicine

## 2024-01-03 ENCOUNTER — Other Ambulatory Visit: Payer: Self-pay | Admitting: Family Medicine

## 2024-01-06 ENCOUNTER — Encounter: Payer: Self-pay | Admitting: Family Medicine

## 2024-01-17 DIAGNOSIS — R0602 Shortness of breath: Secondary | ICD-10-CM | POA: Diagnosis not present

## 2024-02-10 ENCOUNTER — Other Ambulatory Visit: Payer: Self-pay | Admitting: Family Medicine

## 2024-03-07 ENCOUNTER — Ambulatory Visit (HOSPITAL_COMMUNITY)
Admission: RE | Admit: 2024-03-07 | Discharge: 2024-03-07 | Disposition: A | Discharge status: Home / Self Care | Source: Ambulatory Visit | Attending: Nurse Practitioner | Admitting: Nurse Practitioner

## 2024-03-07 DIAGNOSIS — I2510 Atherosclerotic heart disease of native coronary artery without angina pectoris: Secondary | ICD-10-CM | POA: Diagnosis not present

## 2024-03-07 DIAGNOSIS — R918 Other nonspecific abnormal finding of lung field: Secondary | ICD-10-CM | POA: Diagnosis not present

## 2024-03-07 DIAGNOSIS — I7121 Aneurysm of the ascending aorta, without rupture: Secondary | ICD-10-CM | POA: Diagnosis not present

## 2024-03-07 DIAGNOSIS — R0602 Shortness of breath: Secondary | ICD-10-CM | POA: Diagnosis not present

## 2024-03-17 ENCOUNTER — Telehealth: Payer: Self-pay | Admitting: Nurse Practitioner

## 2024-03-17 NOTE — Telephone Encounter (Signed)
Pt requesting ct results. Please advise.

## 2024-03-17 NOTE — Telephone Encounter (Signed)
 Patient identification verified by 2 forms. Sims Duck, RN     Called and spoke to patient  Patient states:  - Would like results of CT scan completed on 5/6.    Interventions/Plan - Provider impressions/recommendations not available at this time.  Patient will follow up next week.   Patient agrees with plan, no questions at this time

## 2024-03-19 ENCOUNTER — Ambulatory Visit: Payer: Self-pay | Admitting: Nurse Practitioner

## 2024-04-08 ENCOUNTER — Other Ambulatory Visit: Payer: Self-pay | Admitting: Nurse Practitioner

## 2024-04-19 ENCOUNTER — Emergency Department (HOSPITAL_BASED_OUTPATIENT_CLINIC_OR_DEPARTMENT_OTHER): Admitting: Radiology

## 2024-04-19 ENCOUNTER — Other Ambulatory Visit: Payer: Self-pay

## 2024-04-19 ENCOUNTER — Other Ambulatory Visit: Payer: Self-pay | Admitting: Home Health

## 2024-04-19 ENCOUNTER — Emergency Department (HOSPITAL_BASED_OUTPATIENT_CLINIC_OR_DEPARTMENT_OTHER)
Admission: EM | Admit: 2024-04-19 | Discharge: 2024-04-19 | Disposition: A | Discharge status: Home / Self Care | Attending: Emergency Medicine | Admitting: Emergency Medicine

## 2024-04-19 ENCOUNTER — Ambulatory Visit: Payer: Self-pay

## 2024-04-19 DIAGNOSIS — I10 Essential (primary) hypertension: Secondary | ICD-10-CM | POA: Insufficient documentation

## 2024-04-19 DIAGNOSIS — R0602 Shortness of breath: Secondary | ICD-10-CM | POA: Diagnosis present

## 2024-04-19 DIAGNOSIS — I471 Supraventricular tachycardia, unspecified: Secondary | ICD-10-CM

## 2024-04-19 DIAGNOSIS — R7989 Other specified abnormal findings of blood chemistry: Secondary | ICD-10-CM | POA: Insufficient documentation

## 2024-04-19 DIAGNOSIS — J45909 Unspecified asthma, uncomplicated: Secondary | ICD-10-CM | POA: Diagnosis not present

## 2024-04-19 DIAGNOSIS — R7981 Abnormal blood-gas level: Secondary | ICD-10-CM | POA: Diagnosis not present

## 2024-04-19 DIAGNOSIS — Z79899 Other long term (current) drug therapy: Secondary | ICD-10-CM | POA: Insufficient documentation

## 2024-04-19 DIAGNOSIS — E039 Hypothyroidism, unspecified: Secondary | ICD-10-CM | POA: Diagnosis not present

## 2024-04-19 DIAGNOSIS — J90 Pleural effusion, not elsewhere classified: Secondary | ICD-10-CM | POA: Diagnosis not present

## 2024-04-19 DIAGNOSIS — I700 Atherosclerosis of aorta: Secondary | ICD-10-CM | POA: Diagnosis not present

## 2024-04-19 DIAGNOSIS — R531 Weakness: Secondary | ICD-10-CM | POA: Diagnosis not present

## 2024-04-19 LAB — TSH: TSH: 1.17 u[IU]/mL (ref 0.350–4.500)

## 2024-04-19 LAB — CBC
HCT: 40 % (ref 36.0–46.0)
Hemoglobin: 13.6 g/dL (ref 12.0–15.0)
MCH: 30.6 pg (ref 26.0–34.0)
MCHC: 34 g/dL (ref 30.0–36.0)
MCV: 90.1 fL (ref 80.0–100.0)
Platelets: 230 10*3/uL (ref 150–400)
RBC: 4.44 MIL/uL (ref 3.87–5.11)
RDW: 13.3 % (ref 11.5–15.5)
WBC: 10 10*3/uL (ref 4.0–10.5)
nRBC: 0 % (ref 0.0–0.2)

## 2024-04-19 LAB — BASIC METABOLIC PANEL WITH GFR
Anion gap: 17 — ABNORMAL HIGH (ref 5–15)
BUN: 23 mg/dL (ref 8–23)
CO2: 21 mmol/L — ABNORMAL LOW (ref 22–32)
Calcium: 10.1 mg/dL (ref 8.9–10.3)
Chloride: 98 mmol/L (ref 98–111)
Creatinine, Ser: 0.88 mg/dL (ref 0.44–1.00)
GFR, Estimated: >60 mL/min (ref >60)
Glucose, Bld: 128 mg/dL — ABNORMAL HIGH (ref 70–99)
Potassium: 4.3 mmol/L (ref 3.5–5.1)
Sodium: 136 mmol/L (ref 135–145)

## 2024-04-19 LAB — TROPONIN T, HIGH SENSITIVITY
Troponin T High Sensitivity: 33 ng/L — ABNORMAL HIGH (ref <19)
Troponin T High Sensitivity: 73 ng/L — ABNORMAL HIGH (ref <19)

## 2024-04-19 LAB — D-DIMER, QUANTITATIVE: D-Dimer, Quant: 0.52 ug{FEU}/mL — ABNORMAL HIGH (ref 0.00–0.50)

## 2024-04-19 LAB — MAGNESIUM: Magnesium: 2 mg/dL (ref 1.7–2.4)

## 2024-04-19 MED ORDER — SODIUM CHLORIDE 0.9 % IV BOLUS
500.0000 mL | Freq: Once | INTRAVENOUS | Status: AC
  Administered 2024-04-19: 500 mL via INTRAVENOUS

## 2024-04-19 MED ORDER — METOPROLOL SUCCINATE ER 25 MG PO TB24: 25.0000 mg | ORAL_TABLET | Freq: Every day | ORAL | 1 refills | Status: DC

## 2024-04-19 MED ORDER — ADENOSINE 6 MG/2ML IV SOLN
INTRAVENOUS | Status: AC
  Administered 2024-04-19: 6 mg via INTRAVENOUS
  Filled 2024-04-19: qty 6

## 2024-04-19 NOTE — ED Notes (Signed)
 RT present in the room as adenosine was given to the Pt. Ambu bag and suction was set up. Pt HR went back to normal after the adenosine was given.

## 2024-04-19 NOTE — Progress Notes (Signed)
 2 week Zio ordered for SVT evaluation, follow up with Dick,Ernest NP-C, per Dr Arlester Ladd request.

## 2024-04-19 NOTE — ED Provider Notes (Incomplete)
 Elderly female with a history of hypertension, diabetes, atherosclerosis who is presenting today with palpitations, some mild chest pain shortness of breath and sensation of dizziness.  Upon arrival here patient's heart rate is tachycardic and blood pressure is soft.  Patient is mentating normally.  On continuous cardiac monitoring which I independently interpreted patient has a sinus tachycardia which is narrow without P waves present.  Concern for SVT based on my interpretation of the EKG.  Patient given 6 mg of adenosine with conversion to a sinus rhythm.

## 2024-04-19 NOTE — ED Provider Notes (Signed)
 [place] EMERGENCY DEPARTMENT AT [facility] Provider Note   CSN: [id number] Arrival date & time: 04/19/24  1401     Patient presents with: Weakness and Shortness of Breath   Julie [name] is a 83 y.o. female past medical history significant for hypertension, hypothyroid, reactive airway disease, and prediabetes presents today for generalized weakness, shortness of breath, chest tightness, and heaviness in the legs that began this morning around 9 AM.  Patient denies nausea, vomiting, fever, chills, lower extremity swelling, or any history of same.  Patient denies blood thinner use.    Weakness Associated symptoms: shortness of breath   Shortness of Breath      Prior to Admission medications   Medication Sig Start Date End Date Taking? Authorizing Provider  metoprolol  succinate (TOPROL -XL) 25 MG 24 hr tablet Take 1 tablet (25 mg total) by mouth daily. 04/19/24  Yes Sandeep Radell N, PA-C  albuterol  (VENTOLIN  HFA) 108 (90 Base) MCG/ACT inhaler Inhale 2 puffs into the lungs every 6 (six) hours as needed for wheezing or shortness of breath. 10/13/23   Kassie Acquanetta Bradley, MD  atorvastatin  (LIPITOR) 20 MG tablet Take 1 tablet (20 mg total) by mouth daily. 06/09/23   Wyn Jackee VEAR Mickey., NP  gabapentin  (NEURONTIN ) 100 MG capsule Take 100 mg by mouth as needed.    [provider]  levothyroxine  (SYNTHROID ) 50 MCG tablet TAKE 1 TABLET(50 MCG) BY MOUTH DAILY 01/03/24   Burchette, Wolm ORN, MD  VITAMIN E  PO Take by mouth.    [provider]    Allergies: Penicillins, Adhesive [tape], Codeine, Lisinopril, Other, and Percocet [oxycodone -acetaminophen ]    Review of Systems  Constitutional:  Positive for fatigue.  Respiratory:  Positive for chest tightness and shortness of breath.     Updated Vital Signs BP 114/67   Pulse 61   Temp (!) 97.3 F (36.3 C)   Resp (!) 21   Ht 4' 11 (1.499 m)   Wt 60.8 kg   SpO2 95%   BMI 27.07 kg/m   Physical Exam Vitals and  nursing note reviewed.  Constitutional:      General: She is not in acute distress.    Appearance: She is well-developed. She is diaphoretic.  HENT:     Head: Normocephalic and atraumatic.   Eyes:     Conjunctiva/sclera: Conjunctivae normal.    Cardiovascular:     Rate and Rhythm: Regular rhythm. Tachycardia present.     Heart sounds: No murmur heard. Pulmonary:     Effort: Pulmonary effort is normal. No respiratory distress.     Breath sounds: Normal breath sounds.  Abdominal:     Palpations: Abdomen is soft.     Tenderness: There is no abdominal tenderness.   Musculoskeletal:        General: No swelling.     Cervical back: Neck supple.     Right lower leg: No edema.     Left lower leg: No edema.   Skin:    General: Skin is warm.     Capillary Refill: Capillary refill takes less than 2 seconds.   Neurological:     General: No focal deficit present.     Mental Status: She is alert and oriented to person, place, and time.   Psychiatric:        Mood and Affect: Mood normal.     (all labs ordered are listed, but only abnormal results are displayed) Labs Reviewed  BASIC METABOLIC PANEL WITH GFR - Abnormal; Notable for  the following components:      Result Value   CO2 21 (*)    Glucose, Bld 128 (*)    Anion gap 17 (*)    All other components within normal limits  D-DIMER, QUANTITATIVE - Abnormal; Notable for the following components:   D-Dimer, Quant 0.52 (*)    All other components within normal limits  TROPONIN T, HIGH SENSITIVITY - Abnormal; Notable for the following components:   Troponin T High Sensitivity 33 (*)    All other components within normal limits  TROPONIN T, HIGH SENSITIVITY - Abnormal; Notable for the following components:   Troponin T High Sensitivity 73 (*)    All other components within normal limits  CBC  MAGNESIUM  TSH    EKG: EKG Interpretation Date/Time:  Wednesday April 19 2024 14:46:37 EDT Ventricular Rate:  73 PR  Interval:  219 QRS Duration:  87 QT Interval:  374 QTC Calculation: 413 R Axis:   26  Text Interpretation: Sinus rhythm Borderline prolonged PR interval Abnormal R-wave progression, early transition Supraventricular tachycardia RESOLVED SINCE PREVIOUS Confirmed by Doretha Folks ([id number]) on 04/19/2024 2:50:50 PM  Radiology: DG Chest 2 View Result Date: 04/19/2024 CLINICAL DATA:  weakness EXAM: CHEST - 2 VIEW COMPARISON:  Chest x-ray 12/07/2022, CT chest 03/07/2024 FINDINGS: Cardiac paddles overlie the chest. The heart and mediastinal contours are unchanged. Atherosclerotic plaque. No focal consolidation. No pulmonary edema. Blunting of bilateral costophrenic angles with possible trace pleural effusions. No pneumothorax. No acute osseous abnormality.  Thoracic surgical hardware. IMPRESSION: 1. Trace bilateral pleural effusions. 2.  Aortic Atherosclerosis (ICD10-I70.0). Electronically Signed   By: Morgane  Naveau M.D.   On: 04/19/2024 16:13     Procedures   Medications Ordered in the ED  adenosine  (ADENOCARD ) 6 MG/2ML injection (6 mg Intravenous Given 04/19/24 1442)  sodium chloride  0.9 % bolus 500 mL (0 mLs Intravenous Stopped 04/19/24 1507)  sodium chloride  0.9 % bolus 500 mL (500 mLs Intravenous New Bag/Given 04/19/24 1708)                                    Medical Decision Making Amount and/or Complexity of Data Reviewed Labs: ordered. Radiology: ordered.   This patient presents to the ED for concern of shortness of breath, fatigue, chest tightness,, this involves an extensive number of treatment options, and is a complaint that carries with it a high risk of complications and morbidity.  The differential diagnosis includes STEMI, NSTEMI, SVT, electrolyte abnormality, hypothyroidism, hyperthyroidism, PE   Additional history obtained:  Additional history obtained from EMR External records from outside source obtained and reviewed including cardiology notes   Lab Tests:  I  Ordered, and personally interpreted labs.  The pertinent results include: CBC WNL, D-dimer 0.52 age-adjusted normal, BMP with mildly decreased CO2 at 21, and mildly elevated anion gap at 17, elevated troponin at 33, 73, TSH 1.17   Imaging Studies ordered:  I ordered imaging studies including chest x-ray I independently visualized and interpreted imaging which showed trace bilateral pleural effusions I agree with the radiologist interpretation   Cardiac Monitoring: / EKG:  The patient was maintained on a cardiac monitor.  I personally viewed and interpreted the cardiac monitored which showed an underlying rhythm of: Initially SVT, repeat EKG after cardioversion shows this rhythm   Problem List / ED Course / Critical interventions / Medication management  I ordered medication including adenosine  and 500mL IVF Reevaluation of  the patient after these medicines showed that the patient no longer in SVT I have reviewed the patients home medicines and have made adjustments as needed   Test / Admission - Considered:  Consulted Dr. Wonda with cardiology who recommend the patient stop taking losartan  and begin taking metoprolol  succinate 25 mg daily.  He stated that they will likely put her on a ZIO heart monitor for 2 weeks and have her follow-up in clinic.  I also discussed her elevated troponin levels with him, he felt this was likely due to her SVT.  He felt she was safe for discharge at this time. Consider for admission or further workup however patient's vital signs, physical exam, labs, and imaging have been reassuring.  Patient to discontinue losartan  and begin taking metoprolol  succinate per cardiology recommendation.  Patient to follow-up with cardiology as soon as possible for further evaluation workup.  Patient given return precautions.  I feel patient is safe for discharge at this time.     Final diagnoses:  SVT (supraventricular tachycardia) [facility])    ED Discharge Orders           Ordered    metoprolol  succinate (TOPROL -XL) 25 MG 24 hr tablet  Daily        04/19/24 1814    Ambulatory referral to Cardiology       Comments: If you have not heard from the Cardiology office within the next 72 hours please call [phone].   Pending               Francis Ileana SAILOR, PA-C 04/19/24 1815    Doretha Folks, MD 04/20/24 [phone]

## 2024-04-19 NOTE — Telephone Encounter (Signed)
 FYI Only or Action Required?: FYI only for provider  Patient was last seen in primary care on 06/22/2023 by Julie Furth, MD. Called Nurse Triage reporting Hypotension. Symptoms began today. Interventions attempted: Nothing. Symptoms are: gradually worsening.  Triage Disposition: Go to ED or PCP/Alternative with Approval  Patient/caregiver understands and will follow disposition?: YES  **Pt. Agrees to go to ED for symptoms                Copied from CRM [id number]. Topic: Clinical - Red Word Triage >> Apr 19, 2024  1:14 PM Julie [name] F wrote: Red Word that prompted transfer to Nurse Triage: Patients legs feeling heavy, feeling light headed and blood pressure is 90/63 Reason for Disposition  [1] Fall in systolic BP > 20 mm Hg from normal AND [2] dizzy, lightheaded, or weak  Answer Assessment - Initial Assessment Questions 1. BLOOD PRESSURE: What is the blood pressure? Did you take at least two measurements 5 minutes apart?      The patients last B/P was 90/63 2. ONSET: When did you take your blood pressure?     15 minutes ago  3. HOW: How did you obtain the blood pressure? (e.g., visiting nurse, automatic home BP monitor)     Home B/P 4. HISTORY: Do you have a history of low blood pressure? What is your blood pressure normally?     Her normal pressures are 120's over 80  5. MEDICINES: Are you taking any medications for blood pressure? If Yes, ask: Have they been changed recently?     Yes, no changes. Losartan  was last taken this morning.   She denies feeling faint currently, however symptoms persist, she also reports SOB, and difficultly breathing. She reports feeling weak. Her symptoms began today. ED advised. She will call someone to take her there now.  Protocols used: Blood Pressure - Low-A-AH

## 2024-04-19 NOTE — ED Notes (Signed)
Called lab to add mag

## 2024-04-19 NOTE — ED Notes (Signed)
 Dr. Leida Puna, Lane Pinon PA, [place] RN, Robin RRT and [place], [place] at bedside for Adenosine administration. Patient on hands-free pads and portable cardiac defibrillator. Robin, RRT has BVM ready. NS 500 mL IV fluids started.

## 2024-04-19 NOTE — Discharge Instructions (Addendum)
 Today you were seen for SVT.  Please stop taking your losartan  and begin taking metoprolol  succinate 25 mg daily.  Cardiology will reach out to you in the upcoming days to schedule a heart monitor and to schedule a follow-up appointment.  Thank you for letting us  treat you today. After reviewing your labs and imaging, I feel you are safe to go home. Please follow up with your PCP in the next several days and provide them with your records from this visit. Return to the Emergency Room if pain becomes severe or symptoms worsen.

## 2024-04-19 NOTE — ED Triage Notes (Signed)
 Pt via pov from home with weakness, sob, heaviness in legs since awakening this morning at 9AM. Denies n/v. States she does have some tightness in her chest. Denies hx of the same. Pt alert & oriented, nad noted.

## 2024-04-20 ENCOUNTER — Ambulatory Visit: Attending: Cardiovascular Disease

## 2024-04-20 DIAGNOSIS — I471 Supraventricular tachycardia, unspecified: Secondary | ICD-10-CM

## 2024-04-20 NOTE — Progress Notes (Unsigned)
 Enrolled for [employer] to mail a ZIO XT long term holter monitor to the patients address on file.

## 2024-04-20 NOTE — ED Provider Notes (Signed)
.  Cardioversion  Date/Time: 04/20/2024 4:32 PM  Performed by: Almond Army, MD Authorized by: Almond Army, MD   Consent:    Consent obtained:  Verbal   Consent given by:  Patient   Risks discussed:  Induced arrhythmia Pre-procedure details:    Cardioversion basis:  Emergent   Rhythm:  Supraventricular tachycardia Patient sedated: No Comments:     Chemical cardioversion with 6mg  of adenosine with return to sinus rhythm      Almond Army, MD 04/20/24 [phone]

## 2024-04-21 ENCOUNTER — Telehealth: Payer: Self-pay

## 2024-04-21 NOTE — Transitions of Care (Post Inpatient/ED Visit) (Signed)
   04/21/2024  Name: Julie [name] MRN: [id number] DOB: 1940-11-11  Today's TOC FU Call Status:   Patient's Name and Date of Birth confirmed.  Transition Care Management Follow-up Telephone Call Date of Discharge: 04/19/24 Discharge Facility: [facility] ([facility]) Type of Discharge: Emergency Department Reason for ED Visit: Cardiac Conditions Cardiac Conditions Diagnosis: Acute Myocardial Infarction (MI Value Set, Old MI) How have you been since you were released from the hospital?: Better Any questions or concerns?: No  Items Reviewed: Did you receive and understand the discharge instructions provided?: Yes Medications obtained,verified, and reconciled?: Yes (Medications Reviewed) Any new allergies since your discharge?: No Dietary orders reviewed?: No Do you have support at home?: Yes People in Home [RPT]: spouse Name of Support/Comfort Primary Source: chris  Medications Reviewed Today: Medications Reviewed Today     Reviewed by Cristie Donate, CMA (Certified Medical Assistant) on 04/21/24 at [phone]  Med List Status: <None>   Medication Order Taking? Sig Documenting Provider Last Dose Status Informant  albuterol  (VENTOLIN  HFA) 108 (90 Base) MCG/ACT inhaler [id number] Yes Inhale 2 puffs into the lungs every 6 (six) hours as needed for wheezing or shortness of breath. Quillian Brunt, MD  Active   atorvastatin  (LIPITOR) 20 MG tablet [id number] Yes Take 1 tablet (20 mg total) by mouth daily. Gerald Kitty., NP  Active   gabapentin  (NEURONTIN ) 100 MG capsule [id number] Yes Take 100 mg by mouth as needed. [provider]  Active   levothyroxine  (SYNTHROID ) 50 MCG tablet [id number] Yes TAKE 1 TABLET(50 MCG) BY MOUTH DAILY Burchette, Marijean Shouts, MD  Active   metoprolol  succinate (TOPROL -XL) 25 MG 24 hr tablet [id number] Yes Take 1 tablet (25 mg total) by mouth daily. Carie Charity, PA-C  Active   VITAMIN E  PO [id number] Yes Take by mouth. [provider]   Active             Home Care and Equipment/Supplies:    Functional Questionnaire: Do you need assistance with bathing/showering or dressing?: No Do you need assistance with meal preparation?: No Do you need assistance with eating?: No Do you have difficulty maintaining continence: No Do you need assistance with getting out of bed/getting out of a chair/moving?: No Do you have difficulty managing or taking your medications?: No  Follow up appointments reviewed: PCP Follow-up appointment confirmed?: Yes Date of PCP follow-up appointment?: 04/25/24 Follow-up Provider: [facility] Follow-up appointment confirmed?: Yes Date of Specialist follow-up appointment?: 05/10/24 Follow-Up Specialty Provider:: Renelda Carry, dick Do you need transportation to your follow-up appointment?: No Do you understand care options if your condition(s) worsen?: Yes-patient verbalized understanding    SIGNATURE Silvia Markuson, cma

## 2024-04-25 ENCOUNTER — Ambulatory Visit: Attending: Physician Assistant | Admitting: Physician Assistant

## 2024-04-25 ENCOUNTER — Ambulatory Visit (INDEPENDENT_AMBULATORY_CARE_PROVIDER_SITE_OTHER): Admitting: Family Medicine

## 2024-04-25 ENCOUNTER — Encounter: Payer: Self-pay | Admitting: Family Medicine

## 2024-04-25 VITALS — BP 138/80 | HR 68 | Ht 59.0 in | Wt 133.6 lb

## 2024-04-25 VITALS — BP 132/70 | HR 63 | Temp 97.6°F | Wt 134.4 lb

## 2024-04-25 DIAGNOSIS — I2510 Atherosclerotic heart disease of native coronary artery without angina pectoris: Secondary | ICD-10-CM

## 2024-04-25 DIAGNOSIS — I471 Supraventricular tachycardia, unspecified: Secondary | ICD-10-CM

## 2024-04-25 DIAGNOSIS — E7800 Pure hypercholesterolemia, unspecified: Secondary | ICD-10-CM

## 2024-04-25 DIAGNOSIS — I10 Essential (primary) hypertension: Secondary | ICD-10-CM

## 2024-04-25 DIAGNOSIS — E039 Hypothyroidism, unspecified: Secondary | ICD-10-CM

## 2024-04-25 DIAGNOSIS — G4700 Insomnia, unspecified: Secondary | ICD-10-CM

## 2024-04-25 MED ORDER — METOPROLOL SUCCINATE ER 25 MG PO TB24: 12.5000 mg | ORAL_TABLET | Freq: Every day | ORAL | 3 refills | Status: AC

## 2024-04-25 MED ORDER — TRAZODONE HCL 50 MG PO TABS: 50.0000 mg | ORAL_TABLET | Freq: Every day | ORAL | 1 refills | Status: DC

## 2024-04-25 MED ORDER — LOSARTAN POTASSIUM 50 MG PO TABS: 50.0000 mg | ORAL_TABLET | Freq: Every day | ORAL | 3 refills | Status: AC

## 2024-04-25 NOTE — Patient Instructions (Signed)
 Medication Instructions:  Your physician has recommended you make the following change in your medication:  DECREASE TOPROL  TO 12.5 MG (1/2 TABLET) DAILY RESTART LOSARTAN  50 MG DAILY.   *If you need a refill on your cardiac medications before your next appointment, please call your pharmacy*  Lab Work: NONE If you have labs (blood work) drawn today and your tests are completely normal, you will receive your results only by: MyChart Message (if you have MyChart) OR A paper copy in the mail If you have any lab test that is abnormal or we need to change your treatment, we will call you to review the results.  Testing/Procedures: NONE  Follow-Up: At [facility], you and your health needs are our priority.  As part of our continuing mission to provide you with exceptional heart care, our providers are all part of one team.  This team includes your primary Cardiologist (physician) and Advanced Practice Providers or APPs (Physician Assistants and Nurse Practitioners) who all work together to provide you with the care you need, when you need it.  Your next appointment:   4 week(s)  Provider:   One of our Advanced Practice Providers (APPs): Morse Clause, PA-C  Lamarr Satterfield, NP Miriam Shams, NP  Olivia Pavy, PA-C Josefa Beauvais, NP  Leontine Salen, PA-C Orren Fabry, PA-C  Stella, PA-C Ernest Dick, NP  Damien Braver, NP Jon Hails, PA-C  Waddell Donath, PA-C    Dayna Dunn, PA-C  Scott Weaver, PA-C Lum Louis, NP Katlyn West, NP Callie Goodrich, PA-C  Evan Williams, PA-C Sheng Haley, PA-C  Xika Zhao, NP Kathleen Johnson, PA-C     We recommend signing up for the patient portal called MyChart.  Sign up information is provided on this After Visit Summary.  MyChart is used to connect with patients for Virtual Visits (Telemedicine).  Patients are able to view lab/test results, encounter notes, upcoming appointments, etc.  Non-urgent messages can be sent to your provider  as well.   To learn more about what you can do with MyChart, go to [email or web address].

## 2024-04-25 NOTE — Progress Notes (Signed)
 Cardiology Office Note:  .   Date:  04/25/2024  ID:  Julie [name], DOB 02/08/1941, MRN [id number] PCP: Micheal Wolm ORN, MD  [facility] Providers Cardiologist:  None    History of Present Illness: .   Julie [name] is a 83 y.o. female   with a PMH of nonobstructive CAD, hypercholesteremia, prediabetes, essential hypertension, GERD, hypothyroidism.  She was seen 04/06/23 for 1 month follow-up of shortness of breath and BP.  She underwent a nuclear stress test that was normal and low risk and also wore an event monitor due to complaint of palpitations that showed brief runs of PACs and PVCs.  During her follow-up she continued to endorse shortness of breath and was referred to pulmonology for PFTs.  She was also prescribed an albuterol  inhaler at that time.  She was advised to continue to monitor her blood pressures and losartan  was increased to 100 mg daily and then decrease back to 50 mg.  Patient was in the ED 04/2024 with palpitations and SVT chemical cardioversion with adenosine  6 mg. She felt weak, dizzy and short of breath. No chest pain. EKG from ED reviewed and may be sinus tachycardia 133/m. Patient says HR got up to 160's. Wearing monitor now. She said the toprol  they put her on makes her feel so dizzy and weak. They stopped her losartan . BP up today and at home 140-150/m . Has been taking Nyquil at night to sleep on occasion,    ROS:    Studies Reviewed: SABRA         Prior CV Studies:    Monitory 02/2023  Normal sinus rhythm with rare PACs and rare PVCs.   Rare brief runs of PACs, longest was 7 beats.   No atrial fibrillation.   Patient symptoms correlated to PACs.     Patch Wear Time:  7 days and 7 hours (2024-04-05T12:09:48-0400 to 2024-04-12T20:06:36-0400)   Patient had a min HR of 53 bpm, max HR of 158 bpm, and avg HR of 69 bpm. Predominant underlying rhythm was Sinus Rhythm. First Degree AV Block was present. 29 Supraventricular Tachycardia runs occurred,  the run with the fastest interval lasting 4 beats  with a max rate of 158 bpm, the longest lasting 7 beats with an avg rate of 118 bpm. Isolated SVEs were rare (<1.0%), SVE Couplets were rare (<1.0%), and SVE Triplets were rare (<1.0%). Isolated VEs were rare (<1.0%), VE Couplets were rare (<1.0%), and  no VE Triplets were present. Ventricular Bigeminy was present.  NST 02/2023   The study is normal. The study is low risk.   No ST deviation was noted.   LV perfusion is normal. There is no evidence of ischemia. There is no evidence of infarction.   Left ventricular function is normal. Nuclear stress EF: 72 %. The left ventricular ejection fraction is hyperdynamic (>65%). End diastolic cavity size is normal. End systolic cavity size is normal.   Prior study available for comparison from 09/24/2020.      Risk Assessment/Calculations:             Physical Exam:   VS:  BP 138/80   Pulse 68   Ht 4' 11 (1.499 m)   Wt 133 lb 9.6 oz (60.6 kg)   SpO2 94%   BMI 26.98 kg/m    Orhtostatics: No data found. Wt Readings from Last 3 Encounters:  04/25/24 133 lb 9.6 oz (60.6 kg)  04/19/24 134 lb 0.6 oz (60.8 kg)  11/23/23 134  lb 0.6 oz (60.8 kg)    GEN: Well nourished, well developed in no acute distress NECK: No JVD; No carotid bruits CARDIAC:  RRR, no murmurs, rubs, gallops RESPIRATORY:  Clear to auscultation without rales, wheezing or rhonchi  ABDOMEN: Soft, non-tender, non-distended EXTREMITIES:  No edema; No deformity   ASSESSMENT AND PLAN: .    SVT vs ST in ED converted to NSR with adenosine  6 mg daily. Feeling poorly on toprol -dizzy, fatigue. Will try to decrease toprol  25 mg 1/2 daily and restart losartan  50 mg 1/2 tablet for BP control.wearing zio now. F/u after.   Essential hypertension: -Patient's blood pressure elevated today -restart losartan  50 mg 1/2 daily     Nonobstructive CAD: --Cardiac CTA  2021 showing calcium  score of 307 and (50-69%) stenosis in proximal LAD with  noncalcified plaques  -Myoview   was low risk and normal on  02/10/23 -Continue Lipitor 20 mg daily -Patient recently discontinued ASA 81 mg due to increased bruising    Hypercholesteremia: -Patient was started on Lipitor 20 mg due to elevated LDL-no recent level checked but not fasting today.     Hypothyroidism: -Currently managed by PCP               Dispo: f/u after monitor back.  Signed, Olivia Pavy, PA-C

## 2024-04-25 NOTE — Patient Instructions (Signed)
 Get back on the cholesterol medication (Atorvastatin )  Set up 2 month follow up.

## 2024-04-25 NOTE — Progress Notes (Signed)
 Established Patient Office Visit  Subjective   Patient ID: Julie [name], female    DOB: [birth year]  Age: 83 y.o. MRN: [id number]  No chief complaint on file.   HPI   Julie [name] is seen for ER follow-up for recent probable SVT episode.  She already saw cardiologist earlier today.  She has past medical history significant for hyperlipidemia, nonobstructive CAD, prediabetes, hypertension, hypothyroidism.  She had been seen back in June with some shortness of breath.  Underwent nuclear stress test which was low risk.  Also wore event monitor then secondary to palpitations which showed brief runs of PACs and PVCs.  She eventually saw pulmonary for PFTs.  Prescribed albuterol .  Was seen in the ER on 20 June with palpitations and monitoring showed probable SVT.  She had chemical cardioversion with adenosine  6 mg.  No chest pain.  Patient noted that her heart rate got up to 160s.  Now has repeat Zio patch.  She was placed on Toprol  25 mg but had dizziness and weakness.  Her losartan  was stopped in the ER and her blood pressure was up when she saw cardiologist.  She was instructed earlier today to start back losartan  50 mg 1/2 tablet daily and metoprolol  succinate 25 mg 1/2 tablet daily.  In going over her medications she has not been taking her Lipitor recently.  She is advised to get back on that.  Previous coronary calcium  score over 300.  Other issue is insomnia.  She has difficulty getting to sleep and staying asleep.  Does frequently look at bright lights at night prior to bedtime.  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   Other bursitis disorders    history   Personal history of colonic polyps    PONV (postoperative nausea and vomiting)    Pre-diabetes    Pure hypercholesterolemia    Spondylolisthesis, cervical region    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    skin -spots on face, forehead and leg removed   SVD (spontaneous vaginal delivery)    x 1   Unspecified disorder of thyroid     Unspecified hypothyroidism    Past Surgical History:  Procedure Laterality Date   ABDOMINAL HYSTERECTOMY     ANTERIOR CERVICAL DECOMP/DISCECTOMY FUSION N/A 11/11/2017   Procedure: Cervical seven - Thoracic one  Anterior cervical decompression/discectomy/fusion;  Surgeon: Unice Pac, MD;  Location: [facility] OR;  Service: Neurosurgery;  Laterality: N/A;   ANTERIOR LAT LUMBAR FUSION Left 01/22/2017   Procedure: Left Lumbar three-four, Lumbar four-five  Anterior lateral lumbar interbody fusion;  Surgeon: Pac Unice, MD;  Location: [facility] OR;  Service: Neurosurgery;  Laterality: Left;   BACK SURGERY  2002   L4/L5   BLADDER SUSPENSION N/A 03/07/2014   Procedure: TRANSVAGINAL TAPE (TVT) PROCEDURE WITH CYSTO;  Surgeon: Marie-Lyne Lavoie, MD;  Location: [facility] ORS;  Service: Gynecology;  Laterality: N/A;  vaginal  blephorplasty      bilateral    BREAST SURGERY     x 2 breast implants, later removed   COLONOSCOPY     CYSTOCELE REPAIR N/A 03/07/2014   Procedure: ANTERIOR REPAIR (CYSTOCELE);  Surgeon: Marie-Lyne Lavoie, MD;  Location: [facility] ORS;  Service: Gynecology;  Laterality: N/A;   devaited septum surgery  1975   EYE SURGERY     bilateral cataract surgery   KNEE ARTHROSCOPY  10/21/2012   right - Procedure: ARTHROSCOPY KNEE;  Surgeon: Lonni CINDERELLA Poli, MD;  Location: [facility] ORS;  Service: Orthopedics;  Laterality: Left;  Left Knee Arthroscopy, Debridement, partial synovectomy   LUMBAR LAMINECTOMY     LUMBAR  PERCUTANEOUS PEDICLE SCREW 2 LEVEL N/A 01/22/2017   Procedure: Lumbar Percutaneous Pedicle Screw Placement Lumbar three-four, Lumbar four-five;  Surgeon: Fairy Levels, MD;  Location: [facility] OR;  Service: Neurosurgery;  Laterality: N/A;   ROBOTIC ASSISTED LAPAROSCOPIC SACROCOLPOPEXY N/A 03/07/2014   Procedure: ROBOTIC ASSISTED LAPAROSCOPIC SACROCOLPOPEXY With Betsey;  Surgeon: Marie-Lyne Lavoie, MD;  Location: [facility] OR[facility];  Service: Gynecology;  Laterality: N/A;   ROBOTIC ASSISTED SUPRACERVICAL HYSTERECTOMY WITH BILATERAL SALPINGO OOPHERECTOMY N/A 03/07/2014   Procedure: ROBOTIC ASSISTED SUPRACERVICAL HYSTERECTOMY WITH BILATERAL SALPINGO OOPHORECTOMY;  Surgeon: Marie-Lyne Lavoie, MD;  Location: [facility];  Service: Gynecology;  Laterality: N/A;  4 hrs.   TONSILLECTOMY     TOTAL KNEE ARTHROPLASTY Left 12/30/2012   Procedure: TOTAL KNEE ARTHROPLASTY;  Surgeon: Lonni CINDERELLA Poli, MD;  Location: [facility];  Service: Orthopedics;  Laterality: Left;  Left Total Knee Arthroplasty   TUBAL LIGATION     WISDOM TOOTH EXTRACTION      reports that she has never smoked. She has been exposed to tobacco smoke. She has never used smokeless tobacco. She reports that she does not drink alcohol and does not use drugs. family history includes Brain cancer in her grandchild; Breast cancer in her mother; Diabetes in her sister; Emphysema in her maternal aunt; Heart attack in her father; Lung cancer in her mother; Other in her grandchild; Stroke in her father; Ulcerative colitis in her daughter. Allergies  Allergen Reactions   Penicillins Anaphylaxis    REACTION: causes tongue to swell Has patient had a PCN reaction causing immediate rash, facial/tongue/throat swelling, SOB or lightheadedness with hypotension: Yes swelling Has patient had a PCN reaction causing severe rash involving mucus membranes or skin necrosis:  Has patient had a PCN reaction that required hospitalization no Has patient had a PCN reaction occurring within the last  10 years: no If all of the above answers are NO, then may proceed with Cephalosporin use.    Adhesive [Tape] Other (See Comments)    Please use paper tape   Codeine Nausea Only   Lisinopril     REACTION: wheezing   Other Other (See Comments)    Pt was having knee surgery and was given an epidural, which did not work.   Percocet [Oxycodone -Acetaminophen ] Nausea And Vomiting    Review of Systems  Constitutional:  Negative for chills, fever and malaise/fatigue.  Eyes:  Negative for blurred vision.  Respiratory:  Negative for shortness of breath.   Cardiovascular:  Negative for chest pain.  Neurological:  Negative for dizziness, weakness and headaches.      Objective:     BP 132/70 (BP Location: Left Arm, Patient Position: Sitting, Cuff Size: Normal)   Pulse 63   Temp 97.6 F (36.4 C) (Oral)   Wt 134 lb 6.4 oz (61 kg)   SpO2 95%  BMI 27.15 kg/m  BP Readings from Last 3 Encounters:  04/25/24 132/70  04/25/24 138/80  04/19/24 (!) 116/59   Wt Readings from Last 3 Encounters:  04/25/24 134 lb 6.4 oz (61 kg)  04/25/24 133 lb 9.6 oz (60.6 kg)  04/19/24 134 lb 0.6 oz (60.8 kg)      Physical Exam Vitals reviewed.  Constitutional:      General: She is not in acute distress.    Appearance: She is well-developed. She is not ill-appearing.   Eyes:     Pupils: Pupils are equal, round, and reactive to light.   Neck:     Thyroid : No thyromegaly.     Vascular: No JVD.   Cardiovascular:     Rate and Rhythm: Normal rate and regular rhythm.     Heart sounds:     No gallop.  Pulmonary:     Effort: Pulmonary effort is normal. No respiratory distress.     Breath sounds: Normal breath sounds. No wheezing or rales.   Musculoskeletal:     Cervical back: Neck supple.     Right lower leg: No edema.     Left lower leg: No edema.   Neurological:     Mental Status: She is alert.      No results found for any visits on 04/25/24.    The ASCVD Risk score (Arnett DK, et  al., 2019) failed to calculate for the following reasons:   The 2019 ASCVD risk score is only valid for ages 75 to 59    Assessment & Plan:   #1 recent tachyarrhythmia associated with dizziness and shortness of breath with probable SVT.  Patient now on low-dose metoprolol .  ZIO monitor in place.  Also instructed to watch her caffeine intake and careful with alcohol.  #2 hypertension.  Currently stable.  Continue losartan  50 mg 1/2 tablet daily and metoprolol  25 mg 1/2 tablet daily  #3 insomnia.  Patient states she has difficulty getting asleep and staying asleep.  We discussed principles of good sleep hygiene with handout given.  Try to minimize medications.  We agreed to trial of trazodone  50 mg nightly.  Avoid regular daytime naps.    Wolm Scarlet, MD

## 2024-04-27 ENCOUNTER — Ambulatory Visit: Admitting: Family Medicine

## 2024-04-27 DIAGNOSIS — Z0000 Encounter for general adult medical examination without abnormal findings: Secondary | ICD-10-CM

## 2024-04-27 NOTE — Progress Notes (Signed)
 PATIENT CHECK-IN and HEALTH RISK ASSESSMENT QUESTIONNAIRE:  -completed by phone/video for upcoming Medicare Preventive Visit   Pre-Visit Check-in: 1)Vitals (height, wt, BP, etc) - record in vitals section for visit on day of visit Request home vitals (wt, BP, etc.) and enter into vitals, THEN update Vital Signs SmartPhrase below at the top of the HPI. See below.  2)Review and Update Medications, Allergies PMH, Surgeries, Social history in Epic 3)Hospitalizations in the last year with date/reason? none  4)Review and Update Care Team (patient's specialists) in Epic 5) Complete PHQ9 in Epic  6) Complete Fall Screening in Epic 7)Review all Health Maintenance Due and order under PCP if not done.  Medicare Wellness Patient Questionnaire:  Answer theses question about your habits: How often do you have a drink containing alcohol?n How many drinks containing alcohol do you have on a typical day when you are drinking?n/a How often do you have six or more drinks on one occasion?n/a Have you ever smoked?n Quit date if applicable? na  How many packs a day do/did you smoke? na Do you use smokeless tobacco?n Do you use an illicit drugs?n On average, how many days per week do you engage in moderate to strenuous exercise (like a brisk walk)?3 On average, how many minutes do you engage in exercise at this level?20, walking Typical breakfast: oatmeal or eggs Typical lunch: yams, baked potato, grilled cheese Typical dinner: home cooked or goes out Typical snacks:triscuits and cheese  Beverages: water  or green tea  Answer theses question about your everyday activities: Can you perform most household chores?y Are you deaf or have significant trouble hearing?n Do you feel that you have a problem with memory?n Do you feel safe at home?y Last dentist visit?has dentures 8. Do you have any difficulty performing your everyday activities?n Are you having any difficulty walking, taking medications on your  own, and or difficulty managing daily home needs?n Do you have difficulty walking or climbing stairs?n Do you have difficulty dressing or bathing?n Do you have difficulty doing errands alone such as visiting a doctor's office or shopping?n Do you currently have any difficulty preparing food and eating?n Do you currently have any difficulty using the toilet?n Do you have any difficulty managing your finances?n Do you have any difficulties with housekeeping of managing your housekeeping?n   Do you have Advanced Directives in place (Living Will, Healthcare Power or Attorney)?  Still on the to do list   Last eye Exam and location?Dr. Garrel Bertin, thinks last appt was last year   Do you currently use prescribed or non-prescribed narcotic or opioid pain medications?n  Do you have a history or close family history of breast, ovarian, tubal or peritoneal cancer or a family member with BRCA (breast cancer susceptibility 1 and 2) gene mutations? Mother, breast ca     ----------------------------------------------------------------------------------------------------------------------------------------------------------------------------------------------------------------------  Because this visit was a virtual/telehealth visit, some criteria may be missing or patient reported. Any vitals not documented were not able to be obtained and vitals that have been documented are patient reported.    MEDICARE ANNUAL PREVENTIVE VISIT WITH PROVIDER: (Welcome to Medicare, initial annual wellness or annual wellness exam)  Virtual Visit via Video Note  I connected with Julie [name] on 04/27/24 by a video enabled telemedicine application and verified that I am speaking with the correct person using two identifiers.  Location patient: home Location provider:work or home office Persons participating in the virtual visit: patient, provider  Concerns and/or follow up today: had issues with SVT  recently - saw cardiologist 2 days ago and is doing well. Just saw PCP too recently.    See HM section in Epic for other details of completed HM.    ROS: negative for report of fevers, unintentional weight loss, vision changes, vision loss, hearing loss or change, chest pain, sob, hemoptysis, melena, hematochezia, hematuria, falls, bleeding or bruising, thoughts of suicide or self harm, memory loss  Patient-completed extensive health risk assessment - reviewed and discussed with the patient: See Health Risk Assessment completed with patient prior to the visit either above or in recent phone note. This was reviewed in detailed with the patient today and appropriate recommendations, orders and referrals were placed as needed per Summary below and patient instructions.   Review of Medical History: -PMH, PSH, Family History and current specialty and care providers reviewed and updated and listed below   Patient Care Team: Micheal Wolm ORN, MD as PCP - General (Family Medicine) Unice Pac, MD as Consulting Physician (Neurosurgery) Dermatology, [place], Sharyne MATSU, [facility] (Inactive) as Pharmacist (Pharmacist)   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   Other bursitis disorders    history   Personal history of colonic polyps    PONV (postoperative nausea and vomiting)    Pre-diabetes    Pure hypercholesterolemia    Spondylolisthesis, cervical region    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;  Service: Neurosurgery;  Laterality: N/A;   ANTERIOR LAT LUMBAR FUSION Left 01/22/2017   Procedure: Left Lumbar three-four, Lumbar four-five  Anterior lateral lumbar interbody fusion;  Surgeon: Pac Unice, MD;  Location: [facility] OR;  Service: Neurosurgery;  Laterality: Left;   BACK SURGERY  2002   L4/L5   BLADDER SUSPENSION N/A 03/07/2014   Procedure: TRANSVAGINAL TAPE (TVT) PROCEDURE WITH CYSTO;  Surgeon: Marie-Lyne Lavoie, MD;  Location: [facility] ORS;  Service: Gynecology;  Laterality: N/A;  vaginal    blephorplasty      bilateral    BREAST SURGERY     x 2 breast implants, later removed   COLONOSCOPY     CYSTOCELE REPAIR N/A 03/07/2014   Procedure: ANTERIOR REPAIR (CYSTOCELE);  Surgeon: Marie-Lyne Lavoie, MD;  Location: [facility] ORS;  Service: Gynecology;  Laterality: N/A;   devaited septum surgery  1975   EYE SURGERY     bilateral cataract surgery   KNEE ARTHROSCOPY  10/21/2012   right - Procedure: ARTHROSCOPY KNEE;  Surgeon: Lonni CINDERELLA Poli, MD;  Location: [facility];  Service: Orthopedics;  Laterality: Left;  Left Knee Arthroscopy, Debridement, partial synovectomy   LUMBAR LAMINECTOMY     LUMBAR PERCUTANEOUS PEDICLE SCREW 2 LEVEL N/A 01/22/2017   Procedure: Lumbar Percutaneous Pedicle Screw Placement Lumbar three-four, Lumbar four-five;  Surgeon: Fairy Levels, MD;  [facility] OR;  Service: Neurosurgery;  Laterality: N/A;   ROBOTIC ASSISTED LAPAROSCOPIC SACROCOLPOPEXY N/A 03/07/2014   Procedure: ROBOTIC ASSISTED LAPAROSCOPIC SACROCOLPOPEXY With Betsey;  Surgeon: Marie-Lyne  Lavoie, MD;  Location: [facility];  Service: Gynecology;  Laterality: N/A;   ROBOTIC ASSISTED SUPRACERVICAL HYSTERECTOMY WITH BILATERAL SALPINGO OOPHERECTOMY N/A 03/07/2014   Procedure: ROBOTIC ASSISTED SUPRACERVICAL HYSTERECTOMY WITH BILATERAL SALPINGO OOPHORECTOMY;  Surgeon: Marie-Lyne Lavoie, MD;  Location: [facility];  Service: Gynecology;  Laterality: N/A;  4 hrs.   TONSILLECTOMY     TOTAL KNEE ARTHROPLASTY Left 12/30/2012   Procedure: TOTAL KNEE ARTHROPLASTY;  Surgeon: Lonni CINDERELLA Poli, MD;  Location: [facility] OR[facility];  Service: Orthopedics;  Laterality: Left;  Left Total Knee Arthroplasty   TUBAL LIGATION     WISDOM TOOTH EXTRACTION      Social History   Socioeconomic History   Marital status: Married    Spouse name: Willistine Ferrall   Number of children: 1   Years of education: Not on file   Highest education level: Associate degree: occupational, Scientist, product/process development, or vocational program  Occupational History   Occupation: retired Social worker  Tobacco Use   Smoking status: Never    Passive exposure: Past   Smokeless tobacco: Never  Vaping Use   Vaping status: Never Used  Substance and Sexual Activity   Alcohol use: No   Drug use: No   Sexual activity: Yes    Birth control/protection: Post-menopausal  Other Topics Concern   Not on file  Social History Narrative   Not on file   Social Drivers of Health   Financial Resource Strain: Patient Declined (04/23/2024)   Overall Financial Resource Strain (CARDIA)    Difficulty of Paying Living Expenses: Patient declined  Food Insecurity: No Food Insecurity (04/23/2024)   Hunger Vital Sign    Worried About Running Out of Food in the Last Year: Never true    Ran Out of Food in the Last Year: Never true  Transportation Needs: No Transportation Needs (04/23/2024)   PRAPARE - Administrator, Civil Service (Medical): No    Lack of Transportation (Non-Medical): No  Physical Activity: Insufficiently Active (04/23/2024)   Exercise Vital Sign     Days of Exercise per Week: 3 days    Minutes of Exercise per Session: 20 min  Stress: Stress Concern Present (04/23/2024)   [employer] of Occupational Health - Occupational Stress Questionnaire    Feeling of Stress: To some extent  Social Connections: Moderately Isolated (04/23/2024)   Social Connection and Isolation Panel    Frequency of Communication with Friends and Family: More than three times a week    Frequency of Social Gatherings with Friends and Family: Once a week    Attends Religious Services: Patient declined    Database administrator or Organizations: No    Attends Engineer, structural: Not on file    Marital Status: Married  Catering manager Violence: Not on file    Family History  Problem Relation Age of Onset   Breast cancer Mother    Lung cancer Mother    Stroke Father    Heart attack Father    Diabetes Sister    Emphysema Maternal Aunt  Brain cancer Grandchild    Other Grandchild        PSC-had liver transplant   Ulcerative colitis Daughter    [name] cancer Neg Hx     Current Outpatient Medications on File Prior to Visit  Medication Sig Dispense Refill   albuterol  (VENTOLIN  HFA) 108 (90 Base) MCG/ACT inhaler Inhale 2 puffs into the lungs every 6 (six) hours as needed for wheezing or shortness of breath. 6.7 g 2   atorvastatin  (LIPITOR) 20 MG tablet Take 1 tablet (20 mg total) by mouth daily. 90 tablet 3   gabapentin  (NEURONTIN ) 100 MG capsule Take 100 mg by mouth as needed.     levothyroxine  (SYNTHROID ) 50 MCG tablet TAKE 1 TABLET(50 MCG) BY MOUTH DAILY 30 tablet 0   losartan  (COZAAR ) 50 MG tablet Take 1 tablet (50 mg total) by mouth daily. 90 tablet 3   metoprolol  succinate (TOPROL  XL) 25 MG 24 hr tablet Take 0.5 tablets (12.5 mg total) by mouth daily. 45 tablet 3   traZODone  (DESYREL ) 50 MG tablet Take 1 tablet (50 mg total) by mouth at bedtime. 30 tablet 1   VITAMIN E  PO Take by mouth.     No current facility-administered medications on  file prior to visit.    Allergies  Allergen Reactions   Penicillins Anaphylaxis    REACTION: causes tongue to swell Has patient had a PCN reaction causing immediate rash, facial/tongue/throat swelling, SOB or lightheadedness with hypotension: Yes swelling Has patient had a PCN reaction causing severe rash involving mucus membranes or skin necrosis:  Has patient had a PCN reaction that required hospitalization no Has patient had a PCN reaction occurring within the last 10 years: no If all of the above answers are NO, then may proceed with Cephalosporin use.    Adhesive [Tape] Other (See Comments)    Please use paper tape   Codeine Nausea Only   Lisinopril     REACTION: wheezing   Other Other (See Comments)    Pt was having knee surgery and was given an epidural, which did not work.   Percocet [Oxycodone -Acetaminophen ] Nausea And Vomiting       Physical Exam Vitals requested from patient and listed below if patient had equipment and was able to obtain at home for this virtual visit: There were no vitals filed for this visit. Estimated body mass index is 27.15 kg/m as calculated from the following:   Height as of 04/25/24: 4' 11 (1.499 m).   Weight as of 04/25/24: 134 lb 6.4 oz (61 kg).  EKG (optional): deferred due to virtual visit  GENERAL: alert, oriented, no acute distress detected, full vision exam deferred due to pandemic and/or virtual encounter  HEENT: atraumatic, conjunttiva clear, no obvious abnormalities on inspection of external nose and ears  NECK: normal movements of the head and neck  LUNGS: on inspection no signs of respiratory distress, breathing rate appears normal, no obvious gross SOB, gasping or wheezing  CV: no obvious cyanosis  MS: moves all visible extremities without noticeable abnormality  PSYCH/NEURO: pleasant and cooperative, no obvious depression or anxiety, speech and thought processing grossly intact, Cognitive function grossly  intact  Flowsheet Row PULMONARY REHAB OTHER RESPIRATORY from 11/16/2023 in [facility]  PHQ-9 Total Score 3        04/27/2024    5:36 PM 11/16/2023    3:58 PM 07/16/2023    1:12 PM 12/29/2022    4:53 PM 12/07/2022  3:16 PM  Depression screen PHQ 2/9  Decreased Interest 0 0 0 0 0  Down, Depressed, Hopeless 0 0 0 0 0  PHQ - 2 Score 0 0 0 0 0  Altered sleeping  1 2 3 2   Tired, decreased energy  1 1 2 2   Change in appetite  1 1 0 1  Feeling bad or failure about yourself   0 0 0 0  Trouble concentrating  0 0 0 0  Moving slowly or fidgety/restless  0 0 0 0  Suicidal thoughts  0 0 0 0  PHQ-9 Score  3 4 5 5   Difficult doing work/chores  Not difficult at all Somewhat difficult Somewhat difficult Not difficult at all       11/16/2023    2:11 PM 11/18/2023    1:44 PM 11/23/2023    2:03 PM 11/25/2023    1:43 PM 04/27/2024    5:36 PM  Fall Risk  Falls in the past year? 1 1 1 1  0  Was there an injury with Fall? 0 0 0 0 0  Fall Risk Category Calculator 1 1 1 1  0  Patient at Risk for Falls Due to History of fall(s);Impaired balance/gait;Other (Comment) History of fall(s);Impaired balance/gait;Other (Comment) History of fall(s);Impaired balance/gait;Other (Comment) History of fall(s);Impaired balance/gait;Other (Comment)   Patient at Risk for Falls Due to - Comments HIGH FALLS RISK HIGH FALLS RISK HIGH FALLS RISK high falls risk   Fall risk Follow up Falls evaluation completed;Falls prevention discussed Falls evaluation completed;Falls prevention discussed Falls evaluation completed;Falls prevention discussed Falls evaluation completed;Falls prevention discussed      SUMMARY AND PLAN:  Encounter for Medicare annual wellness exam   Discussed applicable health maintenance/preventive health measures and advised and referred or ordered per patient preferences: -discussed vaccines due recs and risks, advised can get at the pharmacy and to  let us  know if does so that we can update record -talked about bone density, she is considering, was on fosamax  in the past and completed 5 years, she plans to let Dr. Micheal know if is interested in discussing or evaluating further. Discussed wt bearing exercise, adequate vit d and calcium . Health Maintenance  Topic Date Due   Zoster Vaccines- Shingrix (1 of 2) Never done   COVID-19 Vaccine (3 - Pfizer risk series) 01/30/2020   INFLUENZA VACCINE  06/02/2024   Medicare Annual Wellness (AWV)  04/27/2025   DTaP/Tdap/Td (3 - Td or Tdap) 05/09/2032   Pneumococcal Vaccine: 50+ Years  Completed   DEXA SCAN  Completed   Hepatitis B Vaccines  Aged Out   HPV VACCINES  Aged Out   Meningococcal B Vaccine  Aged [employer] and counseling on the following was provided based on the above review of health and a plan/checklist for the patient, along with additional information discussed, was provided for the patient in the patient instructions :  -Advised on importance of completing advanced directives, discussed options for completing and provided information in patient instructions as well -Provided safe balance exercises that can be done at home to improve balance and discussed exercise guidelines for adults with include balance exercises at least 3 days per week.  -Advised and counseled on a healthy lifestyle - including the importance of a healthy diet, regular physical activity, social connections and stress management. -Reviewed patient's current diet. Advised and counseled on a whole foods based healthy diet. A summary of a healthy diet was provided in the Patient Instructions.  -reviewed  patient's current physical activity level and discussed exercise guidelines for adults. Discussed community resources and ideas for safe exercise at home to assist in meeting exercise guideline recommendations in a safe and healthy way.  -Advise yearly dental visits at minimum and regular eye  exams   Follow up: see patient instructions     Patient Instructions  I really enjoyed getting to talk with you today! I am available on Tuesdays and Thursdays for virtual visits if you have any questions or concerns, or if I can be of any further assistance.   CHECKLIST FROM ANNUAL WELLNESS VISIT:  -Follow up (please call to schedule if not scheduled after visit):   -yearly for annual wellness visit with primary care office  Here is a list of your preventive care/health maintenance measures and the plan for each if any are due:  PLAN For any measures below that may be due:  -can get vaccines at the pharmacy  Health Maintenance  Topic Date Due   Zoster Vaccines- Shingrix (1 of 2) Never done   COVID-19 Vaccine (3 - Pfizer risk series) 01/30/2020   Medicare Annual Wellness (AWV)  12/30/2023   INFLUENZA VACCINE  06/02/2024   DTaP/Tdap/Td (3 - Td or Tdap) 05/09/2032   Pneumococcal Vaccine: 50+ Years  Completed   DEXA SCAN  Completed   Hepatitis B Vaccines  Aged Out   HPV VACCINES  Aged Out   Meningococcal B Vaccine  Aged Out    -See a dentist at least yearly  -Get your eyes checked and then per your eye specialist's recommendations  -Other issues addressed today:   -I have included below further information regarding a healthy whole foods based diet, physical activity guidelines for adults, stress management and opportunities for social connections. I hope you find this information useful.   -----------------------------------------------------------------------------------------------------------------------------------------------------------------------------------------------------------------------------------------------------------    NUTRITION: -eat real food: lots of colorful vegetables (half the plate) and fruits -5-7 servings of vegetables and fruits per day (fresh or steamed is best), exp. 2 servings of vegetables with lunch and dinner and 2 servings of fruit  per day. Berries and greens such as kale and collards are great choices.  -consume on a regular basis:  fresh fruits, fresh veggies, fish, nuts, seeds, healthy oils (such as olive oil, avocado oil), whole grains (make sure for bread/pasta/crackers/etc., that the first ingredient on label contains the word whole), legumes. -can eat small amounts of dairy and lean meat (no larger than the palm of your hand), but avoid processed meats such as ham, bacon, lunch meat, etc. -drink water  -try to avoid fast food and pre-packaged foods, processed meat, ultra processed foods/beverages (donuts, candy, etc.) -most experts advise limiting sodium to < 2300mg  per day, should limit further is any chronic conditions such as high blood pressure, heart disease, diabetes, etc. The American Heart Association advised that < 1500mg  is is ideal -try to avoid foods/beverages that contain any ingredients with names you do not recognize  -try to avoid foods/beverages  with added sugar or sweeteners/sweets  -try to avoid sweet drinks (including diet drinks): soda, juice, Gatorade, sweet tea, power drinks, diet drinks -try to avoid white rice, white bread, pasta (unless whole grain)  EXERCISE GUIDELINES FOR ADULTS: -if you wish to increase your physical activity, do so gradually and with the approval of your doctor -STOP and seek medical care immediately if you have any chest pain, chest discomfort or trouble breathing when starting or increasing exercise  -move and stretch your body, legs, feet and arms  when sitting for long periods -Physical activity guidelines for optimal health in adults: -get at least 150 minutes per week of moderate exercise (can talk, but not sing); this is about 20-30 minutes of sustained activity 5-7 days per week or two 10-15 minute episodes of sustained activity 5-7 days per week -do some muscle building/resistance training/strength training at least 2 days per week  -balance exercises 3+ days per  week:   Stand somewhere where you have something sturdy to hold onto if you lose balance    1) lift up on toes, then back down, start with 5x per day and work up to 20x   2) stand and lift one leg straight out to the side so that foot is a few inches of the floor, start with 5x each side and work up to 20x each side   3) stand on one foot, start with 5 seconds each side and work up to 20 seconds on each side  If you need ideas or help with getting more active:  -Silver sneakers https://tools.silversneakers.com  -Walk with a Doc: http://www.duncan-williams.com/  -try to include resistance (weight lifting/strength building) and balance exercises twice per week: or the following link for ideas: http://castillo-powell.com/  BuyDucts.dk  STRESS MANAGEMENT: -can try meditating, or just sitting quietly with deep breathing while intentionally relaxing all parts of your body for 5 minutes daily -if you need further help with stress, anxiety or depression please follow up with your primary doctor or contact the wonderful folks at [facility]: [phone]  SOCIAL CONNECTIONS: -options in [place] if you wish to engage in more social and exercise related activities:  -Silver sneakers https://tools.silversneakers.com  -Walk with a Doc: http://www.duncan-williams.com/  -Check out the [facility] Active Adults 50+ section on the [place] of [employer] (hiking clubs, book clubs, cards and games, chess, exercise classes, aquatic classes and much more) - see the website for details: https://www.[place].gov/departments/parks-recreation/active-adults50  -YouTube has lots of exercise videos for different ages and abilities as well  -[facility] (a variety of indoor and outdoor inperson activities for adults). [phone]. [address]  -Virtual Online Classes (a variety of  topics): see seniorplanet.org or call (515)775-9563  -consider volunteering at a school, hospice center, church, senior center or elsewhere            Chiquita JONELLE Cramp, DO

## 2024-04-27 NOTE — Patient Instructions (Addendum)
 I really enjoyed getting to talk with you today! I am available on Tuesdays and Thursdays for virtual visits if you have any questions or concerns, or if I can be of any further assistance.   CHECKLIST FROM ANNUAL WELLNESS VISIT:  -Follow up (please call to schedule if not scheduled after visit):   -yearly for annual wellness visit with primary care office  Here is a list of your preventive care/health maintenance measures and the plan for each if any are due:  PLAN For any measures below that may be due:  -can get vaccines at the pharmacy  Health Maintenance  Topic Date Due   Zoster Vaccines- Shingrix (1 of 2) Never done   COVID-19 Vaccine (3 - Pfizer risk series) 01/30/2020   Medicare Annual Wellness (AWV)  12/30/2023   INFLUENZA VACCINE  06/02/2024   DTaP/Tdap/Td (3 - Td or Tdap) 05/09/2032   Pneumococcal Vaccine: 50+ Years  Completed   DEXA SCAN  Completed   Hepatitis B Vaccines  Aged Out   HPV VACCINES  Aged Out   Meningococcal B Vaccine  Aged Out    -See a dentist at least yearly  -Get your eyes checked and then per your eye specialist's recommendations  -Other issues addressed today:   -I have included below further information regarding a healthy whole foods based diet, physical activity guidelines for adults, stress management and opportunities for social connections. I hope you find this information useful.   -----------------------------------------------------------------------------------------------------------------------------------------------------------------------------------------------------------------------------------------------------------    NUTRITION: -eat real food: lots of colorful vegetables (half the plate) and fruits -5-7 servings of vegetables and fruits per day (fresh or steamed is best), exp. 2 servings of vegetables with lunch and dinner and 2 servings of fruit per day. Berries and greens such as kale and collards are great choices.   -consume on a regular basis:  fresh fruits, fresh veggies, fish, nuts, seeds, healthy oils (such as olive oil, avocado oil), whole grains (make sure for bread/pasta/crackers/etc., that the first ingredient on label contains the word whole), legumes. -can eat small amounts of dairy and lean meat (no larger than the palm of your hand), but avoid processed meats such as ham, bacon, lunch meat, etc. -drink water  -try to avoid fast food and pre-packaged foods, processed meat, ultra processed foods/beverages (donuts, candy, etc.) -most experts advise limiting sodium to < 2300mg  per day, should limit further is any chronic conditions such as high blood pressure, heart disease, diabetes, etc. The American Heart Association advised that < 1500mg  is is ideal -try to avoid foods/beverages that contain any ingredients with names you do not recognize  -try to avoid foods/beverages  with added sugar or sweeteners/sweets  -try to avoid sweet drinks (including diet drinks): soda, juice, Gatorade, sweet tea, power drinks, diet drinks -try to avoid white rice, white bread, pasta (unless whole grain)  EXERCISE GUIDELINES FOR ADULTS: -if you wish to increase your physical activity, do so gradually and with the approval of your doctor -STOP and seek medical care immediately if you have any chest pain, chest discomfort or trouble breathing when starting or increasing exercise  -move and stretch your body, legs, feet and arms when sitting for long periods -Physical activity guidelines for optimal health in adults: -get at least 150 minutes per week of moderate exercise (can talk, but not sing); this is about 20-30 minutes of sustained activity 5-7 days per week or two 10-15 minute episodes of sustained activity 5-7 days per week -do some muscle building/resistance training/strength training at least  2 days per week  -balance exercises 3+ days per week:   Stand somewhere where you have something sturdy to hold onto if  you lose balance    1) lift up on toes, then back down, start with 5x per day and work up to 20x   2) stand and lift one leg straight out to the side so that foot is a few inches of the floor, start with 5x each side and work up to 20x each side   3) stand on one foot, start with 5 seconds each side and work up to 20 seconds on each side  If you need ideas or help with getting more active:  -Silver sneakers https://tools.silversneakers.com  -Walk with a Doc: http://www.duncan-williams.com/  -try to include resistance (weight lifting/strength building) and balance exercises twice per week: or the following link for ideas: http://castillo-powell.com/  BuyDucts.dk  STRESS MANAGEMENT: -can try meditating, or just sitting quietly with deep breathing while intentionally relaxing all parts of your body for 5 minutes daily -if you need further help with stress, anxiety or depression please follow up with your primary doctor or contact the wonderful folks at [facility]: [phone]  SOCIAL CONNECTIONS: -options in [place] if you wish to engage in more social and exercise related activities:  -Silver sneakers https://tools.silversneakers.com  -Walk with a Doc: http://www.duncan-williams.com/  -Check out the [facility] Active Adults 50+ section on the [place] [employer] (hiking clubs, book clubs, cards and games, chess, exercise classes, aquatic classes and much more) - see the website for details: https://www.[place]-.gov/departments/parks-recreation/active-adults50  -YouTube has lots of exercise videos for different ages and abilities as well  -[facility] (a variety of indoor and outdoor inperson activities for adults). [phone]. [address]  -Virtual Online Classes (a variety of topics): see seniorplanet.org or call 850-266-9162  -consider  volunteering at a school, hospice center, church, senior center or elsewhere

## 2024-05-10 ENCOUNTER — Ambulatory Visit: Admitting: Nurse Practitioner

## 2024-05-10 DIAGNOSIS — I471 Supraventricular tachycardia, unspecified: Secondary | ICD-10-CM | POA: Diagnosis not present

## 2024-05-12 ENCOUNTER — Ambulatory Visit: Payer: Self-pay | Admitting: Cardiovascular Disease

## 2024-05-12 DIAGNOSIS — I471 Supraventricular tachycardia, unspecified: Secondary | ICD-10-CM | POA: Diagnosis not present

## 2024-05-22 ENCOUNTER — Encounter: Payer: Self-pay | Admitting: *Deleted

## 2024-05-23 ENCOUNTER — Encounter: Payer: Self-pay | Admitting: Emergency Medicine

## 2024-05-23 ENCOUNTER — Ambulatory Visit: Attending: Cardiology | Admitting: Emergency Medicine

## 2024-05-23 VITALS — BP 124/84 | HR 63 | Ht 59.0 in | Wt 133.0 lb

## 2024-05-23 DIAGNOSIS — I10 Essential (primary) hypertension: Secondary | ICD-10-CM | POA: Diagnosis not present

## 2024-05-23 DIAGNOSIS — E039 Hypothyroidism, unspecified: Secondary | ICD-10-CM

## 2024-05-23 DIAGNOSIS — I471 Supraventricular tachycardia, unspecified: Secondary | ICD-10-CM | POA: Diagnosis not present

## 2024-05-23 DIAGNOSIS — Z79899 Other long term (current) drug therapy: Secondary | ICD-10-CM

## 2024-05-23 DIAGNOSIS — I2510 Atherosclerotic heart disease of native coronary artery without angina pectoris: Secondary | ICD-10-CM | POA: Diagnosis not present

## 2024-05-23 DIAGNOSIS — E785 Hyperlipidemia, unspecified: Secondary | ICD-10-CM

## 2024-05-23 NOTE — Patient Instructions (Addendum)
 Medication Instructions:  NO CHANGES   Lab Work: FASTING LIPID PANEL AND LFTs TO BE DONE IN 8 WEEKS.   Testing/Procedures: NONE  Follow-Up: At [facility], you and your health needs are our priority.  As part of our continuing mission to provide you with exceptional heart care, our providers are all part of one team.  This team includes your primary Cardiologist (physician) and Advanced Practice Providers or APPs (Physician Assistants and Nurse Practitioners) who all work together to provide you with the care you need, when you need it.  Your next appointment:   6 month(s)  Provider:   ONEIL PARCHMENT, MD (AS NEW PATIENT) OR MADISON FOUNTAIN, DNP (FORMER [facility] PATIENT).

## 2024-05-23 NOTE — Progress Notes (Addendum)
 Cardiology Office Note:    Date:  05/23/2024  ID:  Julie [name], DOB 21-Feb-1941, MRN [id number] PCP: Micheal Wolm ORN, MD  [facility] Providers Cardiologist:  Oneil Parchment, MD Cardiology APP:  Rana Lum CROME, NP       Patient Profile:       Chief Complaint: 1 month follow-up History of Present Illness:  Julie [name] is a 83 y.o. female with visit-pertinent history of nonobstructive CAD, hypercholesterolemia, prediabetes, essential hypertension, GERD, hypothyroidism  Coronary CTA 09/2020 showing coronary calcium  score 36 (71st percentile) with makes plaque in the proximal LAD appears moderate 50-69% stenosis, noncalcified plaque in proximal RCA at 50-69% stenosis.  Lexiscan  stress test was completed in 09/2020 that was normal low risk study.  Echocardiogram 12/2022 showed LVEF 60 to 65%, no RWMA, grade 1 DD, RV function and size normal, trivial MR, moderate calcification of aortic valve, aortic valve sclerosis, mild dilation of ascending aorta measuring 38 mm.  Lexiscan  stress test on 02/09/2023 was normal low risk.  ZIO on 02/05/2023 showed normal sinus rhythm with rare PACs and PVCs.  Patient was seen in the ED on 04/2024 with palpitations and SVT with chemical conversion with adenosine  6 mg.  Her losartan  was held.  She was last seen in office on 04/25/2024 for ED follow-up.  She was feeling poorly on metoprolol  with complaints of dizziness and fatigue.  Her metoprolol  was decreased to 12.5 mg daily.  Blood pressure was elevated at visit and her losartan  25 mg daily.    ZIO 04/20/2024 showed normal sinus rhythm with average heart rate 63 bpm.43 SVT runs occurred, the run with the fastest interval lasting 4 beats with max rate of 167 bpm, longest lasting 1 minute 17 seconds with average rate of 108 bpm.  Occasional PACs at 4.6%.  No atrial fibrillation or flutter, no high-grade AV block or significant bradycardic episodes   Discussed the use of AI scribe software for  clinical note transcription with the patient, who gave verbal consent to proceed.  History of Present Illness Julie [name] is an 83 year old female with supraventricular tachycardia who presents for medication management.  Today patient is without acute cardiovascular concerns or complaints.  She denies any chest pain or other anginal symptoms.  She denies any symptoms concerning for recurrent sustained SVT.  She denies any palpitations.  She reports since her metoprolol  was decreased she has had some moderate improvement in her fatigue.  She does still experience some mild fatigue however seems to be well-controlled and does not interfere with current daily activities.  Home blood pressure well-controlled with average BP of 130s over 80s.  Reena reports only mild shortness of breath when working outside in the heat.  She remains active by walking her dog and gardening, though heat limits her activities.  She continues to take atorvastatin  inconsistently.  Currently taking atorvastatin  only once a week.  Her last LDL cholesterol reading in April 2024 was 168 mg/dL.  Family history is significant for heart disease, with her father and stepbrother having died of heart attacks.   She denies any syncope, presyncope, lightheadedness, dizziness, leg swelling  Review of systems:  Please see the history of present illness. All other systems are reviewed and otherwise negative.      Studies Reviewed:        ZIO 04/20/2024 Patch Wear Time:  13 days and 16 hours (2025-06-23T19:06:24-0400 to 2025-07-07T11:26:04-399)   Patient had a min HR of 48 bpm, max HR of  167 bpm, and avg HR of 63 bpm. Predominant underlying rhythm was Sinus Rhythm. 43 Supraventricular Tachycardia runs occurred, the run with the fastest interval lasting 4 beats with a max rate of 167 bpm, the  longest lasting 1 min 17 secs with an avg rate of 108 bpm. Some episodes of Supraventricular Tachycardia may be possible Atrial  Tachycardia with variable block. Supraventricular Tachycardia was detected within +/- 45 seconds of symptomatic patient  event(s). Isolated SVEs were occasional (4.6%, 56273), SVE Couplets were occasional (2.3%, 13946), and SVE Triplets were rare (<1.0%, 1680). Isolated VEs were rare (<1.0%), and no VE Couplets or VE Triplets were present.   Summary: The basic rhythm is normal sinus with an average heart rate of 63 bpm.  There is no atrial fibrillation or flutter.  There is no high-grade AV block or significant bradycardic episodes.  Isolated supraventricular ectopics occurred at a burden of 4.6% and there are short supraventricular runs noted.  The longest supraventricular run is 1 minute and 17 seconds with mild increased rate of 108 bpm.  There are rare ventricular ectopics present.  No sustained ventricular arrhythmias.  ZIO 02/16/2023   Normal sinus rhythm with rare PACs and rare PVCs.   Rare brief runs of PACs, longest was 7 beats.   No atrial fibrillation.   Patient symptoms correlated to PACs.     Patch Wear Time:  7 days and 7 hours (2024-04-05T12:09:48-0400 to 2024-04-12T20:06:36-0400)   Patient had a min HR of 53 bpm, max HR of 158 bpm, and avg HR of 69 bpm. Predominant underlying rhythm was Sinus Rhythm. First Degree AV Block was present. 29 Supraventricular Tachycardia runs occurred, the run with the fastest interval lasting 4 beats  with a max rate of 158 bpm, the longest lasting 7 beats with an avg rate of 118 bpm. Isolated SVEs were rare (<1.0%), SVE Couplets were rare (<1.0%), and SVE Triplets were rare (<1.0%). Isolated VEs were rare (<1.0%), VE Couplets were rare (<1.0%), and  no VE Triplets were present. Ventricular Bigeminy was present.  Lexiscan  Myoview  02/09/2023   The study is normal. The study is low risk.   No ST deviation was noted.   LV perfusion is normal. There is no evidence of ischemia. There is no evidence of infarction.   Left ventricular function is normal.  Nuclear stress EF: 72 %. The left ventricular ejection fraction is hyperdynamic (>65%). End diastolic cavity size is normal. End systolic cavity size is normal.   Prior study available for comparison from 09/24/2020.  Echocardiogram 12/30/2022  1. Left ventricular ejection fraction, by estimation, is 60 to 65%. The  left ventricle has normal function. The left ventricle has no regional  wall motion abnormalities. Left ventricular diastolic parameters are  consistent with Grade I diastolic  dysfunction (impaired relaxation).   2. Right ventricular systolic function is normal. The right ventricular  size is normal.   3. The mitral valve is abnormal. Trivial mitral valve regurgitation. No  evidence of mitral stenosis.   4. Nodular calcification of the non coronary cusp. The aortic valve is  tricuspid. There is moderate calcification of the aortic valve. There is  moderate thickening of the aortic valve. Aortic valve regurgitation is not  visualized. Aortic valve  sclerosis/calcification is present, without any evidence of aortic  stenosis.   5. Aortic dilatation noted. There is mild dilatation of the ascending  aorta, measuring 38 mm.   6. The inferior vena cava is normal in size with greater than 50%  respiratory variability, suggesting right atrial pressure of 3 mmHg.  Risk Assessment/Calculations:              Physical Exam:   VS:  BP 124/84 (BP Location: Right Arm, Patient Position: Sitting, Cuff Size: Normal)   Pulse 63   Ht 4' 11 (1.499 m)   Wt 133 lb (60.3 kg)   SpO2 95%   BMI 26.86 kg/m    Wt Readings from Last 3 Encounters:  05/23/24 133 lb (60.3 kg)  04/25/24 134 lb 6.4 oz (61 kg)  04/25/24 133 lb 9.6 oz (60.6 kg)    GEN: Well nourished, well developed in no acute distress NECK: No JVD; No carotid bruits CARDIAC: RRR, no murmurs, rubs, gallops RESPIRATORY:  Clear to auscultation without rales, wheezing or rhonchi  ABDOMEN: Soft, non-tender,  non-distended EXTREMITIES:  No edema; No acute deformity      Assessment and Plan:  Supraventricular tachycardia ZIO 05/2024 with 43 SVT runs with fastest interval lasting 4 beats with max rate of 167 bpm and longest lasting 1 minute 17 seconds with heart rate of 108 bpm ZIO 02/2023 with 26 SVT runs S/p chemical cardioversion for SVT with 6 mg of adenosine  on 04/19/2024 with return to sinus rhythm Recently metoprolol  was decreased to 12.5 mg daily in the setting of fatigue - Quiescent and well-controlled.  Maintaining HR on Apple Watch without further episodes of tachycardia - Fatigue has improved on low-dose metoprolol  - Continue metoprolol  XL 12.5 mg daily  Hypertension Blood pressure today is 124/84 and well-controlled - Continue metoprolol  XL 12.5 mg daily and losartan  25 mg daily - Maintain home BP monitoring and log  Coronary artery disease Coronary CTA 09/2020 with coronary calcium  score of 306 (71st percentile) Lexiscan  Myoview  09/2020 was normal low risk study Lexiscan  Myoview  02/2023 was normal low risk study - Today patient is without anginal symptoms, no indication for further ischemic evaluation this time - ASA discontinued in the past due to bruising - Continue atorvastatin  20 mg daily  Hyperlipidemia, LDL goal <70 LDL 168 on 02/2023 Patient has been inconsistently taking her atorvastatin .  She denies any myalgias.  Normally taken atorvastatin  once weekly.  Patient denies any active side effects just not wanting to take medication due to fear of side effects - After long discussion patient will resume at her atorvastatin  daily - Continue atorvastatin  20 mg daily and repeat lipid panel/LFTs x 8 weeks  Hypothyroidism TSH 1.17 on 6/25 - On Synthroid  management per PCP      Dispo:  Return in about 6 months (around 11/23/2024).  Former Dr. Dann patient.  Will have patient establish with DOD Dr. Jeffrie.  Signed, Lum LITTIE Louis, NP

## 2024-05-27 ENCOUNTER — Other Ambulatory Visit: Payer: Self-pay

## 2024-05-27 DIAGNOSIS — S51852A Open bite of left forearm, initial encounter: Secondary | ICD-10-CM | POA: Insufficient documentation

## 2024-05-27 DIAGNOSIS — W5551XA Bitten by raccoon, initial encounter: Secondary | ICD-10-CM | POA: Insufficient documentation

## 2024-05-27 DIAGNOSIS — Z23 Encounter for immunization: Secondary | ICD-10-CM | POA: Insufficient documentation

## 2024-05-27 DIAGNOSIS — S51851A Open bite of right forearm, initial encounter: Secondary | ICD-10-CM | POA: Diagnosis not present

## 2024-05-27 DIAGNOSIS — Z2914 Encounter for prophylactic rabies immune globin: Secondary | ICD-10-CM | POA: Insufficient documentation

## 2024-05-27 DIAGNOSIS — Z203 Contact with and (suspected) exposure to rabies: Secondary | ICD-10-CM | POA: Insufficient documentation

## 2024-05-27 NOTE — ED Triage Notes (Signed)
 Pt POV, attacked by nauru while trying to get it off of her cat. Bite noted to L FA, scratches to both arms.

## 2024-05-28 ENCOUNTER — Emergency Department (HOSPITAL_BASED_OUTPATIENT_CLINIC_OR_DEPARTMENT_OTHER)
Admission: EM | Admit: 2024-05-28 | Discharge: 2024-05-28 | Disposition: A | Discharge status: Home / Self Care | Attending: Emergency Medicine | Admitting: Emergency Medicine

## 2024-05-28 DIAGNOSIS — W5551XA Bitten by raccoon, initial encounter: Secondary | ICD-10-CM

## 2024-05-28 MED ORDER — RABIES VACCINE, PCEC IM SUSR
1.0000 mL | Freq: Once | INTRAMUSCULAR | Status: AC
  Administered 2024-05-28: 1 mL via INTRAMUSCULAR
  Filled 2024-05-28: qty 1

## 2024-05-28 MED ORDER — DOXYCYCLINE HYCLATE 100 MG PO CAPS: 100.0000 mg | ORAL_CAPSULE | Freq: Two times a day (BID) | ORAL | 0 refills | Status: AC

## 2024-05-28 MED ORDER — METRONIDAZOLE 500 MG PO TABS
500.0000 mg | ORAL_TABLET | Freq: Once | ORAL | Status: AC
  Administered 2024-05-28: 500 mg via ORAL
  Filled 2024-05-28: qty 1

## 2024-05-28 MED ORDER — METRONIDAZOLE 500 MG PO TABS: 500.0000 mg | ORAL_TABLET | Freq: Two times a day (BID) | ORAL | 0 refills | Status: AC

## 2024-05-28 MED ORDER — DOXYCYCLINE HYCLATE 100 MG PO TABS
100.0000 mg | ORAL_TABLET | Freq: Once | ORAL | Status: AC
  Administered 2024-05-28: 100 mg via ORAL
  Filled 2024-05-28: qty 1

## 2024-05-28 MED ORDER — RABIES IMMUNE GLOBULIN 300 UNIT/2ML IJ SOLN
20.0000 [IU]/kg | Freq: Once | INTRAMUSCULAR | Status: AC
  Administered 2024-05-28: 1200 [IU] via INTRAMUSCULAR
  Filled 2024-05-28: qty 8

## 2024-05-28 NOTE — ED Notes (Signed)
 DC instructions given, follow-up appointment discussed with pt. Pt verbalized understanding. Out of ED with all belongings and paperwork with steady gait not in visible distress.

## 2024-05-28 NOTE — ED Provider Notes (Signed)
 [place] [facility]MENT AT [facility]  Provider Note  CSN: [id number] Arrival date & time: 05/27/24 2351  History Chief Complaint  Patient presents with   Animal Bite    Julie [name] is a 83 y.o. female reports she heard a commotion on her back patio and found a Games developer with her cat. She was able to get the raccoon off the cat but then she was bitten and/or scratched on her arms. She reports TDAP is UTD.    Home Medications Prior to Admission medications   Medication Sig Start Date End Date Taking? Authorizing Provider  doxycycline  (VIBRAMYCIN ) 100 MG capsule Take 1 capsule (100 mg total) by mouth 2 (two) times daily for 7 days. 05/28/24 06/04/24 Yes Roselyn Carlin NOVAK, MD  metroNIDAZOLE  (FLAGYL ) 500 MG tablet Take 1 tablet (500 mg total) by mouth 2 (two) times daily. 05/28/24  Yes Roselyn Carlin NOVAK, MD  albuterol  (VENTOLIN  HFA) 108 [phone] Base) MCG/ACT inhaler Inhale 2 puffs into the lungs every 6 (six) hours as needed for wheezing or shortness of breath. 10/13/23   Kassie Acquanetta Bradley, MD  atorvastatin  (LIPITOR) 20 MG tablet Take 1 tablet (20 mg total) by mouth daily. Patient taking differently: Take 20 mg by mouth as needed. 06/09/23   Wyn Jackee VEAR Mickey., NP  gabapentin  (NEURONTIN ) 100 MG capsule Take 100 mg by mouth as needed.    [provider]  levothyroxine  (SYNTHROID ) 50 MCG tablet TAKE 1 TABLET(50 MCG) BY MOUTH DAILY 01/03/24   Burchette, Wolm ORN, MD  losartan  (COZAAR ) 50 MG tablet Take 1 tablet (50 mg total) by mouth daily. Patient taking differently: Take 50 mg by mouth daily. Per patient 1/2 tablet daily in the morning. 04/25/24 07/24/24  Parthenia Olivia HERO, PA-C  metoprolol  succinate (TOPROL  XL) 25 MG 24 hr tablet Take 0.5 tablets (12.5 mg total) by mouth daily. Patient taking differently: Take 12.5 mg by mouth daily. Per patient taking at night makes me feel dizzy. 04/25/24   Parthenia Olivia HERO, PA-C  traZODone  (DESYREL ) 50 MG tablet Take 1 tablet  (50 mg total) by mouth at bedtime. Patient taking differently: Take 50 mg by mouth as needed. 04/25/24   Burchette, Wolm ORN, MD  VITAMIN E  PO Take by mouth.    [provider]     Allergies    Penicillins, Adhesive [tape], Codeine, Lisinopril, Other, and Percocet [oxycodone -acetaminophen ]   Review of Systems   Review of Systems Please see HPI for pertinent positives and negatives  Physical Exam BP (!) 189/166   Pulse 87   Temp 98.3 F (36.8 C) (Temporal)   Resp 18   Ht 4' 11 (1.499 m)   Wt 59 kg   SpO2 98%   BMI 26.26 kg/m   Physical Exam Vitals and nursing note reviewed.  HENT:     Head: Normocephalic.     Nose: Nose normal.  Eyes:     Extraocular Movements: Extraocular movements intact.  Pulmonary:     Effort: Pulmonary effort is normal.  Musculoskeletal:        General: Normal range of motion.     Cervical back: Neck supple.  Skin:    Findings: No rash (on exposed skin).     Comments: Multiple small puncture wounds and bruising to both forearms  Neurological:     Mental Status: She is alert and oriented to person, place, and time.  Psychiatric:        Mood and Affect: Mood normal.  ED Results / Procedures / Treatments   EKG None  Procedures Procedures  Medications Ordered in the ED Medications  rabies immune globulin  (HYPERRAB) injection 1,200 Units (1,200 Units Intramuscular Given 05/28/24 0406)  rabies vaccine  (RABAVERT ) injection 1 mL (1 mL Intramuscular Given 05/28/24 0407)  metroNIDAZOLE  (FLAGYL ) tablet 500 mg (500 mg Oral Given 05/28/24 0339)  doxycycline  (VIBRA -TABS) tablet 100 mg (100 mg Oral Given 05/28/24 0339)    Initial Impression and Plan  Patient here after raccoon bites to her arms. She is UTD on TDAP, will need rabies PEP. She has PCN allergy, will give flagyl  and doxycycline  for infection prevention. Additional Rabies vaccine  injections at [facility].   ED Course       MDM Rules/Calculators/A&P Medical Decision  Making Problems Addressed: Raccoon bite, initial encounter: acute illness or injury  Risk Prescription drug management.     Final Clinical Impression(s) / ED Diagnoses Final diagnoses:  Raccoon bite, initial encounter    Rx / DC Orders ED Discharge Orders          Ordered    doxycycline  (VIBRAMYCIN ) 100 MG capsule  2 times daily        05/28/24 0416    metroNIDAZOLE  (FLAGYL ) 500 MG tablet  2 times daily        05/28/24 0416             Roselyn Carlin NOVAK, MD 05/28/24 [phone]

## 2024-05-28 NOTE — ED Notes (Signed)
 Rabies vaccine  information explained and given to pt. Pt verbalized understanding. Rabies vaccine  administrated to R deltoid and immunoglobulin affected areas of L/R FA. pt tolerated well.

## 2024-05-31 ENCOUNTER — Ambulatory Visit (HOSPITAL_COMMUNITY)
Admission: RE | Admit: 2024-05-31 | Discharge: 2024-05-31 | Disposition: A | Discharge status: Home / Self Care | Source: Ambulatory Visit | Attending: Family Medicine | Admitting: Family Medicine

## 2024-05-31 DIAGNOSIS — Z23 Encounter for immunization: Secondary | ICD-10-CM

## 2024-05-31 DIAGNOSIS — Z203 Contact with and (suspected) exposure to rabies: Secondary | ICD-10-CM

## 2024-05-31 MED ORDER — RABIES VACCINE, PCEC IM SUSR
1.0000 mL | Freq: Once | INTRAMUSCULAR | Status: AC
  Administered 2024-05-31: 1 mL via INTRAMUSCULAR

## 2024-05-31 MED ORDER — RABIES VACCINE, PCEC IM SUSR
INTRAMUSCULAR | Status: AC
  Filled 2024-05-31: qty 1

## 2024-05-31 NOTE — ED Triage Notes (Signed)
 Pt present for day 3 of rabies injection.

## 2024-06-01 ENCOUNTER — Encounter: Payer: Self-pay | Admitting: Family Medicine

## 2024-06-02 NOTE — Telephone Encounter (Signed)
 Spoke with patient.  She had recent scratch from raccoon.  No bite.  No redness or cellulitis changes.  She is having nausea which she thinks is from metronidazole .  She will try holding the metronidazole  and finish out doxycycline .  Most importantly, finish out rabies vaccine  series  Wolm LELON Scarlet MD [facility]

## 2024-06-05 ENCOUNTER — Ambulatory Visit (HOSPITAL_COMMUNITY)

## 2024-06-05 ENCOUNTER — Ambulatory Visit (HOSPITAL_COMMUNITY)
Admission: RE | Admit: 2024-06-05 | Discharge: 2024-06-05 | Disposition: A | Discharge status: Home / Self Care | Source: Ambulatory Visit | Attending: Family Medicine | Admitting: Family Medicine

## 2024-06-05 DIAGNOSIS — Z203 Contact with and (suspected) exposure to rabies: Secondary | ICD-10-CM | POA: Diagnosis not present

## 2024-06-05 MED ORDER — RABIES VACCINE, PCEC IM SUSR
1.0000 mL | Freq: Once | INTRAMUSCULAR | Status: AC
  Administered 2024-06-05: 1 mL via INTRAMUSCULAR

## 2024-06-05 MED ORDER — RABIES VACCINE, PCEC IM SUSR
INTRAMUSCULAR | Status: AC
  Filled 2024-06-05: qty 1

## 2024-06-06 ENCOUNTER — Ambulatory Visit: Admitting: Physician Assistant

## 2024-06-07 ENCOUNTER — Encounter: Payer: Self-pay | Admitting: Family Medicine

## 2024-06-07 DIAGNOSIS — E119 Type 2 diabetes mellitus without complications: Secondary | ICD-10-CM | POA: Diagnosis not present

## 2024-06-07 DIAGNOSIS — H35372 Puckering of macula, left eye: Secondary | ICD-10-CM | POA: Diagnosis not present

## 2024-06-07 DIAGNOSIS — H04123 Dry eye syndrome of bilateral lacrimal glands: Secondary | ICD-10-CM | POA: Diagnosis not present

## 2024-06-07 DIAGNOSIS — D23122 Other benign neoplasm of skin of left lower eyelid, including canthus: Secondary | ICD-10-CM | POA: Diagnosis not present

## 2024-06-07 DIAGNOSIS — H43813 Vitreous degeneration, bilateral: Secondary | ICD-10-CM | POA: Diagnosis not present

## 2024-06-07 DIAGNOSIS — D23112 Other benign neoplasm of skin of right lower eyelid, including canthus: Secondary | ICD-10-CM | POA: Diagnosis not present

## 2024-06-07 DIAGNOSIS — H353112 Nonexudative age-related macular degeneration, right eye, intermediate dry stage: Secondary | ICD-10-CM | POA: Diagnosis not present

## 2024-06-07 DIAGNOSIS — D23121 Other benign neoplasm of skin of left upper eyelid, including canthus: Secondary | ICD-10-CM | POA: Diagnosis not present

## 2024-06-07 DIAGNOSIS — D23111 Other benign neoplasm of skin of right upper eyelid, including canthus: Secondary | ICD-10-CM | POA: Diagnosis not present

## 2024-06-07 LAB — HM DIABETES EYE EXAM

## 2024-06-12 ENCOUNTER — Encounter (HOSPITAL_COMMUNITY): Payer: Self-pay

## 2024-06-12 ENCOUNTER — Ambulatory Visit (HOSPITAL_COMMUNITY)
Admission: EM | Admit: 2024-06-12 | Discharge: 2024-06-12 | Disposition: A | Discharge status: Home / Self Care | Attending: Family Medicine | Admitting: Family Medicine

## 2024-06-12 DIAGNOSIS — Z203 Contact with and (suspected) exposure to rabies: Secondary | ICD-10-CM

## 2024-06-12 DIAGNOSIS — Z23 Encounter for immunization: Secondary | ICD-10-CM | POA: Diagnosis not present

## 2024-06-12 MED ORDER — RABIES VACCINE, PCEC IM SUSR
INTRAMUSCULAR | Status: AC
  Filled 2024-06-12: qty 1

## 2024-06-12 MED ORDER — RABIES VACCINE, PCEC IM SUSR
1.0000 mL | Freq: Once | INTRAMUSCULAR | Status: AC
  Administered 2024-06-12 (×2): 1 mL via INTRAMUSCULAR

## 2024-06-12 NOTE — ED Triage Notes (Signed)
 Patient here for Day 14 rabies vaccine . Patient's first set of vaccines were 15 days ago. Okay per Ulanda (provider) Patient is doing well.

## 2024-06-13 ENCOUNTER — Other Ambulatory Visit: Payer: Self-pay | Admitting: Nurse Practitioner

## 2024-06-13 ENCOUNTER — Telehealth: Payer: Self-pay | Admitting: Nurse Practitioner

## 2024-06-13 NOTE — Telephone Encounter (Signed)
*  STAT* If patient is at the pharmacy, call can be transferred to refill team.   1. Which medications need to be refilled? (please list name of each medication and dose if known) atorvastatin  (LIPITOR) 20 MG tablet    2. Would you like to learn more about the convenience, safety, & potential cost savings by using the [facility]? No    3. Are you open to using the [facility] (Type [facility]. ). No   4. Which pharmacy/location (including street and city if local pharmacy) is medication to be sent to? [id number] - [place], [place] - [address] AT [facility] OF [address] & [address]     5. Do they need a 30 day or 90 day supply? 60 day

## 2024-06-16 ENCOUNTER — Other Ambulatory Visit: Payer: Self-pay | Admitting: Family Medicine

## 2024-06-20 DIAGNOSIS — H43813 Vitreous degeneration, bilateral: Secondary | ICD-10-CM | POA: Diagnosis not present

## 2024-06-20 DIAGNOSIS — H35372 Puckering of macula, left eye: Secondary | ICD-10-CM | POA: Diagnosis not present

## 2024-06-20 DIAGNOSIS — H35361 Drusen (degenerative) of macula, right eye: Secondary | ICD-10-CM | POA: Diagnosis not present

## 2024-06-21 ENCOUNTER — Other Ambulatory Visit: Payer: Self-pay | Admitting: Family Medicine

## 2024-06-26 DIAGNOSIS — I10 Essential (primary) hypertension: Secondary | ICD-10-CM | POA: Diagnosis not present

## 2024-06-26 DIAGNOSIS — Z79899 Other long term (current) drug therapy: Secondary | ICD-10-CM | POA: Diagnosis not present

## 2024-06-26 LAB — LIPID PANEL

## 2024-06-27 ENCOUNTER — Ambulatory Visit: Payer: Self-pay | Admitting: Emergency Medicine

## 2024-06-27 LAB — HEPATIC FUNCTION PANEL
ALT: 15 IU/L (ref 0–32)
AST: 18 IU/L (ref 0–40)
Albumin: 4.2 g/dL (ref 3.7–4.7)
Alkaline Phosphatase: 85 IU/L (ref 44–121)
Bilirubin Total: 0.6 mg/dL (ref 0.0–1.2)
Bilirubin, Direct: 0.23 mg/dL (ref 0.00–0.40)
Total Protein: 6.4 g/dL (ref 6.0–8.5)

## 2024-06-27 LAB — LIPID PANEL
Cholesterol, Total: 136 mg/dL (ref 100–199)
HDL: 49 mg/dL (ref >39)
LDL CALC COMMENT:: 2.8 ratio (ref 0.0–4.4)
LDL Chol Calc (NIH): 72 mg/dL (ref 0–99)
Triglycerides: 78 mg/dL (ref 0–149)
VLDL Cholesterol Cal: 15 mg/dL (ref 5–40)

## 2024-08-08 DIAGNOSIS — Z85828 Personal history of other malignant neoplasm of skin: Secondary | ICD-10-CM | POA: Diagnosis not present

## 2024-08-08 DIAGNOSIS — L570 Actinic keratosis: Secondary | ICD-10-CM | POA: Diagnosis not present

## 2024-08-08 DIAGNOSIS — L817 Pigmented purpuric dermatosis: Secondary | ICD-10-CM | POA: Diagnosis not present

## 2024-08-08 DIAGNOSIS — L812 Freckles: Secondary | ICD-10-CM | POA: Diagnosis not present

## 2024-08-08 DIAGNOSIS — L820 Inflamed seborrheic keratosis: Secondary | ICD-10-CM | POA: Diagnosis not present

## 2024-08-08 DIAGNOSIS — L821 Other seborrheic keratosis: Secondary | ICD-10-CM | POA: Diagnosis not present

## 2024-08-15 ENCOUNTER — Other Ambulatory Visit: Payer: Self-pay | Admitting: Family Medicine

## 2024-08-24 ENCOUNTER — Other Ambulatory Visit: Payer: Self-pay | Admitting: Family Medicine

## 2024-09-11 ENCOUNTER — Other Ambulatory Visit: Payer: Self-pay | Admitting: Family Medicine

## 2024-09-26 NOTE — Progress Notes (Signed)
   09/26/2024  Patient ID: Julie [name], female   DOB: 29-Nov-1940, 83 y.o.   MRN: [id number]  Pharmacy Quality Measure Review  This patient is appearing on a report for being at risk of failing the adherence measure for cholesterol (statin) medications this calendar year.   Medication: Atorvastatin  Last fill date: 09/08/24 for 90 day supply  Insurance report was not up to date. No action needed at this time.   Jon VEAR Lindau, PharmD Clinical Pharmacist [phone]

## 2024-10-09 ENCOUNTER — Other Ambulatory Visit: Payer: Self-pay | Admitting: Family Medicine

## 2024-10-12 ENCOUNTER — Encounter: Payer: Self-pay | Admitting: Cardiology

## 2024-10-12 ENCOUNTER — Ambulatory Visit: Payer: Self-pay

## 2024-10-12 NOTE — Telephone Encounter (Signed)
 FYI Only or Action Required?: FYI only for provider: appointment scheduled on 12/12.  Patient was last seen in primary care on 04/27/2024 by Luke Chiquita SAUNDERS, DO.  Called Nurse Triage reporting URI.  Symptoms began several days ago.  Interventions attempted: OTC medications: Tylenol  and Rest, hydration, or home remedies.  Symptoms are: gradually worsening.  Triage Disposition: See Physician Within 24 Hours  Patient/caregiver understands and will follow disposition?: Yes  Message from [place] F sent at 10/12/2024  9:01 AM EST  Reason for Triage: Patient having cough, flem, headache, and weakness. Please call [phone]   Reason for Disposition  [1] Continuous (nonstop) coughing interferes with work or school AND [2] no improvement using cough treatment per Care Advice  Answer Assessment - Initial Assessment Questions Cough for a few days and body aches, headache starting last night 12/10. Denies checking her temp but having chills and body aches, feels warm. Denies SOB, CP, Dizziness. BP 138/86 HR 86, O2 93%. Unsure what her baseline O2 is. Intermittent mild wheezing, productive cough but more to clear her throat then getting it out. Sinus congestion. mild Headaches.  Concern for Flu or RSV. Appt with PCP office 12/12 to assess. Will try tylenol /ibuprofen  for fever sx, and headache, advised to try mucinex for phlegm/congestion. ED precautions understood  1. ONSET: When did the cough begin?      Coughing a few days  2. SEVERITY: How bad is the cough today?      Hard to get through conversation, barely got sleep due to coughing 3. SPUTUM: Describe the color of your sputum (e.g., none, dry cough; clear, white, yellow, green)     Unsure- doesn't come out but has to cough to clear what's back there  4. HEMOPTYSIS: Are you coughing up any blood? If Yes, ask: How much? (e.g., flecks, streaks, tablespoons, etc.)     denies 5. DIFFICULTY BREATHING: Are you having difficulty  breathing? If Yes, ask: How bad is it? (e.g., mild, moderate, severe)      Denies, just coughing a lot  6. FEVER: Do you have a fever? If Yes, ask: What is your temperature, how was it measured, and when did it start?     Chills, and feels warm but hasn't checked 7. CARDIAC HISTORY: Do you have any history of heart disease? (e.g., heart attack, congestive heart failure)      HTN  8. LUNG HISTORY: Do you have any history of lung disease?  (e.g., pulmonary embolus, asthma, emphysema)     Reactive Airway  9. PE RISK FACTORS: Do you have a history of blood clots? (or: recent major surgery, recent prolonged travel, bedridden)     Denies  10. OTHER SYMPTOMS: Do you have any other symptoms? (e.g., runny nose, wheezing, chest pain)       Body aches,  Protocols used: Cough - Acute Productive-A-AH

## 2024-10-13 ENCOUNTER — Ambulatory Visit

## 2024-10-13 ENCOUNTER — Ambulatory Visit: Admitting: Family Medicine

## 2024-10-13 VITALS — BP 138/82 | HR 68 | Temp 98.2°F | Ht 59.0 in | Wt 134.1 lb

## 2024-10-13 DIAGNOSIS — J209 Acute bronchitis, unspecified: Secondary | ICD-10-CM | POA: Diagnosis not present

## 2024-10-13 DIAGNOSIS — R051 Acute cough: Secondary | ICD-10-CM

## 2024-10-13 LAB — POCT INFLUENZA A/B
Influenza A, POC: NEGATIVE
Influenza B, POC: NEGATIVE

## 2024-10-13 LAB — POC COVID19 BINAXNOW: SARS Coronavirus 2 Ag: NEGATIVE

## 2024-10-13 MED ORDER — PROMETHAZINE-DM 6.25-15 MG/5ML PO SYRP: 5.0000 mL | ORAL_SOLUTION | Freq: Four times a day (QID) | ORAL | 0 refills | Status: DC | PRN

## 2024-10-13 MED ORDER — METHYLPREDNISOLONE 4 MG PO TBPK: ORAL_TABLET | ORAL | 0 refills | Status: AC

## 2024-10-13 NOTE — Progress Notes (Signed)
 Acute Office Visit  Subjective:     Patient ID: Julie [name], female    DOB: [birth year], 83 y.o.   MRN: [id number]  Chief Complaint  Patient presents with   Cough    Cough, head aches and body aches onset 2 days     Cough Associated symptoms include a fever.  Discussed the use of AI scribe software for clinical note transcription with the patient, who gave verbal consent to proceed.  History of Present Illness   Julie [name] is an 83 year old female with reactive airway disease who presents with cough, headache, and difficulty breathing.  Symptoms started two days ago with cough, headache, body aches, marked fatigue, nasal congestion, drainage, and worsened breathing. She stayed in bed all day yesterday due to fatigue. She has no sore throat, ear pain, or chest pain but notes jaw pain. She has reactive airway disease and uses an inhaler infrequently, and she gets coughing and wheezy when ill. She has no prior lung disease or asthma and no known sick contacts.       Review of Systems  Constitutional:  Positive for fever.  Respiratory:  Positive for cough.   All other systems reviewed and are negative.       Objective:    BP 138/82 (BP Location: Left Arm, Patient Position: Sitting, Cuff Size: Normal)   Pulse 68   Temp 98.2 F (36.8 C) (Oral)   Ht 4' 11 (1.499 m)   Wt 134 lb 1.6 oz (60.8 kg)   SpO2 95%   BMI 27.08 kg/m    Physical Exam Vitals reviewed.  Constitutional:      Appearance: Normal appearance. She is normal weight.  HENT:     Right Ear: Tympanic membrane normal.     Left Ear: Tympanic membrane normal.     Nose: Congestion present.     Mouth/Throat:     Mouth: Mucous membranes are moist.     Pharynx: No posterior oropharyngeal erythema.  Eyes:     Conjunctiva/sclera: Conjunctivae normal.  Cardiovascular:     Rate and Rhythm: Normal rate and regular rhythm.     Heart sounds: Normal heart sounds. No murmur heard. Pulmonary:     Effort:  Pulmonary effort is normal.     Breath sounds: Wheezing (diffuse inspiratory wheezing BL posterior lobes,) present. No rhonchi or rales.  Lymphadenopathy:     Cervical: No cervical adenopathy.  Neurological:     Mental Status: She is alert and oriented to person, place, and time. Mental status is at baseline.  Psychiatric:        Mood and Affect: Mood normal.        Behavior: Behavior normal.     Results for orders placed or performed in visit on 10/13/24  POC Influenza A/B  Result Value Ref Range   Influenza A, POC Negative Negative   Influenza B, POC Negative Negative  POC COVID-19 BinaxNow  Result Value Ref Range   SARS Coronavirus 2 Ag Negative Negative        Assessment & Plan:   Problem List Items Addressed This Visit   None Visit Diagnoses       Acute cough    -  Primary   Relevant Orders   POC Influenza A/B (Completed)   POC COVID-19 BinaxNow (Completed)     Acute bronchitis, unspecified organism       Relevant Medications   methylPREDNISolone  (MEDROL  DOSEPAK) 4 MG TBPK tablet   promethazine -dextromethorphan (PROMETHAZINE -DM) 6.25-15  MG/5ML syrup   Other Relevant Orders   DG Chest 2 View     Assessment and Plan    Acute bronchitis with reactive airway disease Acute bronchitis with reactive airway disease, presenting with cough, headache, body aches, nasal congestion, and dyspnea. Wheezing and airway tightness likely due to viral infection. Differential includes viral bronchitis, influenza, and COVID-19. No current indication for antibiotics as likely viral etiology. Discussed potential for pneumonia, but current lung sounds do not suggest this. Awaiting results of influenza and COVID-19 tests to guide further treatment. - Prescribed Medrol  Dosepak for 6 days to reduce airway inflammation. - Instructed to use inhaler every 2-4 hours to open airways. -  influenza and COVID-19 tests tare both negative today.  - Pt expresses concern about possible pneumonia, will  order chest x-ray         Meds ordered this encounter  Medications   methylPREDNISolone  (MEDROL  DOSEPAK) 4 MG TBPK tablet    Sig: Take Package as directed.    Dispense:  21 each    Refill:  0   promethazine -dextromethorphan (PROMETHAZINE -DM) 6.25-15 MG/5ML syrup    Sig: Take 5 mLs by mouth 4 (four) times daily as needed for cough.    Dispense:  240 mL    Refill:  0    No follow-ups on file.  Heron CHRISTELLA Sharper, MD

## 2024-10-16 ENCOUNTER — Encounter: Payer: Self-pay | Admitting: Family Medicine

## 2024-10-17 ENCOUNTER — Ambulatory Visit (HOSPITAL_BASED_OUTPATIENT_CLINIC_OR_DEPARTMENT_OTHER): Admitting: Pulmonary Disease

## 2024-10-17 ENCOUNTER — Encounter (HOSPITAL_BASED_OUTPATIENT_CLINIC_OR_DEPARTMENT_OTHER): Payer: Self-pay | Admitting: Pulmonary Disease

## 2024-10-17 VITALS — BP 138/82 | HR 56 | Temp 97.8°F | Ht 59.0 in | Wt 132.0 lb

## 2024-10-17 DIAGNOSIS — J42 Unspecified chronic bronchitis: Secondary | ICD-10-CM

## 2024-10-17 NOTE — Patient Instructions (Signed)
 Asthmatic bronchitis URI - improving --CONTINUE Albuterol  every 4-6 hours AS NEEDED for shortness of breath or wheezing --Encourage regular activity for 20 min up to 5-7 times a week

## 2024-10-17 NOTE — Progress Notes (Signed)
 Subjective:   PATIENT ID: Julie [name] GENDER: female DOB: Oct 14, 1941, MRN: [id number]  Chief Complaint  Patient presents with   Cough    Reason for Visit: Follow-up   [name] is a 83 year old female never smoker with HTN, prediabetes, HLD, hypothyroidism, osteoporosis, lumbar scoliosis, insomnia who presents for follow-up.  Initial consult She reports shortness of breath since November 2023, >10 months. Worsens with walking and in the shower or any activity. Rest and sitting down improves it. In October 2023 she was in traveling in [place] for three weeks and felt tired and short of breath with more activity than usual than trip. No acute illness preceding this but has felt her shortness of breath has gradually worsened. Occurs when getting dressed. Before the trip she was not active at baseline due to her back issues/scoliosis but did not feel like her shortness of breath was this bad. Has some nonproductive coughing that requires daily cough drops and worsens with laying down. Denies wheezing. Has gained 20 lbs due to inactivity. Denies history of childhood asthma or chronic bronchitis.  10/13/23 Since our last visit she has been participating in pulmonary rehab and reports that she is learning a lot. Shortness of breath with activity but improved. Denies cough or wheezing.   10/17/24 Since our last visit she was seen by primary care for acute bronchitis started on prednisone  pack for four days for mixed cough, wheezing and shortness of breath. Cold air triggers it. Denies fevers or chills. Test neg for flu/covid. CXR neg for pneumonia. Denies sinus congestion. Cough has improved since then. Prior to this she was doing well and completed rehab last year in 11/2023.   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   Other bursitis disorders    history   Personal history of colonic polyps    PONV (postoperative nausea and vomiting)    Pre-diabetes    Pure hypercholesterolemia    Spondylolisthesis, cervical region    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    skin -spots on face, forehead and leg removed   SVD (spontaneous vaginal delivery)    x 1   Unspecified disorder of thyroid     Unspecified hypothyroidism      Family History  Problem Relation Age of Onset   Breast cancer Mother    Lung cancer Mother    Stroke Father    Heart attack Father    Diabetes Sister    Emphysema Maternal Aunt    Brain cancer Grandchild    Other Grandchild        PSC-had liver transplant   Ulcerative colitis Daughter    Colon cancer Neg Hx      Social History   Occupational History   Occupation: retired social worker  Tobacco Use   Smoking status: Never  Passive exposure: Past   Smokeless tobacco: Never  Vaping Use   Vaping status: Never Used  Substance and Sexual Activity   Alcohol use: No   Drug use: No   Sexual activity: Yes    Birth control/protection: Post-menopausal    Allergies  Allergen Reactions   Penicillins Anaphylaxis    REACTION: causes tongue to swell Has patient had a PCN reaction causing immediate rash, facial/tongue/throat swelling, SOB or lightheadedness with hypotension: Yes swelling Has patient had a PCN reaction causing severe rash involving mucus membranes or skin necrosis:  Has patient had a PCN reaction that required hospitalization no Has patient had a PCN reaction occurring within the last  10 years: no If all of the above answers are NO, then may proceed with Cephalosporin use.    Adhesive [Tape] Other (See Comments)    Please use paper tape   Codeine Nausea Only   Lisinopril     REACTION: wheezing   Other Other (See Comments)    Pt was having knee surgery and was given an epidural, which did not work.   Percocet [Oxycodone -Acetaminophen ] Nausea And Vomiting     Outpatient Medications Prior to Visit  Medication Sig Dispense Refill   albuterol  (VENTOLIN  HFA) 108 (90 Base) MCG/ACT inhaler Inhale 2 puffs into the lungs every 6 (six) hours as needed for wheezing or shortness of breath. 6.7 g 2   atorvastatin  (LIPITOR) 20 MG tablet TAKE 1 TABLET(20 MG) BY MOUTH DAILY 90 tablet 3   levothyroxine  (SYNTHROID ) 50 MCG tablet TAKE 1 TABLET(50 MCG) BY MOUTH DAILY 90 tablet 0   methylPREDNISolone  (MEDROL  DOSEPAK) 4 MG TBPK tablet Take Package as directed. 21 each 0   promethazine -dextromethorphan (PROMETHAZINE -DM) 6.25-15 MG/5ML syrup Take 5 mLs by mouth 4 (four) times daily as needed for cough. 240 mL 0   traZODone  (DESYREL ) 50 MG tablet TAKE 1 TABLET(50 MG) BY MOUTH AT BEDTIME 90 tablet 1   VITAMIN E  PO Take by mouth.     gabapentin  (NEURONTIN ) 100 MG capsule Take 100 mg by mouth as needed. (Patient not taking: Reported on 10/17/2024)     losartan  (COZAAR ) 50 MG tablet Take 1 tablet (50 mg total) by mouth daily. (Patient not taking: Reported on 10/17/2024) 90 tablet 3   metoprolol  succinate (TOPROL  XL) 25 MG 24 hr tablet Take 0.5 tablets (12.5 mg total) by mouth daily. (Patient not taking: Reported on 10/17/2024) 45 tablet 3   metroNIDAZOLE  (FLAGYL ) 500 MG tablet Take 1 tablet (500 mg total) by mouth 2 (two) times daily. (Patient not taking: Reported on 10/17/2024) 14 tablet 0   No facility-administered medications prior to visit.    Review of Systems  Constitutional:  Negative for chills, diaphoresis, fever, malaise/fatigue and weight loss.  HENT:  Negative for congestion.    Respiratory:  Negative for cough, hemoptysis, sputum production, shortness of breath and wheezing.   Cardiovascular:  Negative for chest pain, palpitations and leg swelling.     Objective:   Vitals:   10/17/24 1327  BP: (!) 167/87  Pulse: (!) 56  Temp: 97.8 F (36.6 C)  SpO2: 96%  Weight: 132 lb (59.9 kg)  Height: 4' 11 (1.499 m)     SpO2: 96 % Physical Exam: General: Well-appearing, no acute distress HENT: [place], AT Eyes: EOMI, no scleral icterus Respiratory: Clear to auscultation bilaterally.  No crackles, wheezing or rales Cardiovascular: RRR, -M/R/G, no JVD Extremities:-Edema,-tenderness Neuro: AAO x4, CNII-XII grossly intact Psych: Normal mood, normal affect  Data Reviewed:  Imaging: CT  Chest 03/04/23 - Tiny scattered nodules bilaterally measuring 2-3 mm. No masses, infiltrate, effusion or pneumothorax. CT Chest 03/07/24 - Stable subpleural pulmonary nodules in left apex. CXR 10/13/24 - No acute infiltrate effusion or edema  PFT: 10/13/23 FVC 1.70 (89%) FEV1 1.11 (80%) Ratio 65  TLC 96% DLCO 83% Interpretation: Normal PFTs  Labs: CBC    Component Value Date/Time   WBC 10.0 04/19/2024 1431   RBC 4.44 04/19/2024 1431   HGB 13.6 04/19/2024 1431   HGB 12.0 09/02/2020 0856   HCT 40.0 04/19/2024 1431   HCT 35.4 09/02/2020 0856   PLT 230 04/19/2024 1431   PLT 261 09/02/2020 0856   MCV 90.1 04/19/2024 1431   MCV 90 09/02/2020 0856   MCH 30.6 04/19/2024 1431   MCHC 34.0 04/19/2024 1431   RDW 13.3 04/19/2024 1431   RDW 12.8 09/02/2020 0856   LYMPHSABS 1.7 12/07/2022 1427   MONOABS 0.5 12/07/2022 1427   EOSABS 0.1 12/07/2022 1427   BASOSABS 0.0 12/07/2022 1427   Cardiac: Echo 12/30/22 EF 60-65%. No WMA. Grade I DD     Assessment & Plan:   Discussion: 83 year old female never smoker with HTN, prediabetes, HLD, hypothyroidism, osteoporosis, lumbar scoliosis, insomnia who presents for follow-up. Mild exacerbation that's improving. No pneumonia on imaging.  Continue supportive care. Reviewed pulmonary function tests. Normal PFTs. Partial bronchodilator response. Discussed benefit of bronchodilators while ill. Encouraged regular activity. Discussed preventive care including vaccinations (declined) and handwashing and avoidance when able.   Asthmatic bronchitis URI - improving --CONTINUE Albuterol  every 4-6 hours AS NEEDED for shortness of breath or wheezing --Encourage regular activity for 20 min up to 5-7 times a week  Health Maintenance Immunization History  Administered Date(s) Administered   Influenza-Unspecified 09/02/2018   PFIZER(Purple Top)SARS-COV-2 Vaccination 12/07/2019, 01/02/2020   Pneumococcal Conjugate-13 02/24/2016   Pneumococcal Polysaccharide-23 02/11/2015   Rabies, IM 05/28/2024, 05/31/2024, 06/05/2024, 06/12/2024   Tdap 04/23/2017, 05/09/2022   CT Lung Screen - not qualified  No orders of the defined types were placed in this encounter.  No orders of the defined types were placed in this encounter.   Return in about 1 year (around 10/17/2025).  I have spent a total time of 31-minutes on the day of the appointment including chart review, data review, collecting history, coordinating care and discussing medical diagnosis and plan with the patient/family. Past medical history, allergies, medications were reviewed. Pertinent imaging, labs and tests included in this note have been reviewed and interpreted independently by me.  Hatsuko Bizzarro Slater Staff, MD [facility] 10/17/2024 1:48 PM

## 2024-10-23 ENCOUNTER — Ambulatory Visit: Payer: Self-pay | Admitting: Family Medicine

## 2024-10-30 ENCOUNTER — Ambulatory Visit: Payer: Self-pay | Admitting: Family Medicine

## 2024-10-30 ENCOUNTER — Telehealth: Payer: Self-pay | Admitting: Family Medicine

## 2024-10-30 DIAGNOSIS — J209 Acute bronchitis, unspecified: Secondary | ICD-10-CM

## 2024-10-30 NOTE — Telephone Encounter (Unsigned)
 Copied from CRM #[id number]. Topic: Clinical - Medication Refill >> Oct 30, 2024 12:43 PM [name] wrote: Medication: promethazine -dextromethorphan (PROMETHAZINE -DM) 6.25-15 MG/5ML syrup  Has the patient contacted their pharmacy? Yes (Agent: If no, request that the patient contact the pharmacy for the refill. If patient does not wish to contact the pharmacy document the reason why and proceed with request.) (Agent: If yes, when and what did the pharmacy advise?)  This is the patient's preferred pharmacy:  [id number] [place], [place] - [address] AT [facility] OF [facility] [address] & [facility] [address] [place] [address] Phone: [phone] Fax: [phone]  Is this the correct pharmacy for this prescription? Yes If no, delete pharmacy and type the correct one.   Has the prescription been filled recently? Yes  Is the patient out of the medication? Yes  Has the patient been seen for an appointment in the last year OR does the patient have an upcoming appointment? Yes  Can we respond through MyChart? Yes  Agent: Please be advised that Rx refills may take up to 3 business days. We ask that you follow-up with your pharmacy.

## 2024-10-30 NOTE — Telephone Encounter (Signed)
 FYI Only or Action Required?: Action required by provider: update on patient condition and requesting phenergan  DM refill and additional recommendations.  Patient was last seen in primary care on 10/13/2024 by Julie Heron HERO, MD.  Called Nurse Triage reporting Cough.  Symptoms began about a month ago.  Interventions attempted: Prescription medications: mucinex, phenergan  DM, medrol  dosepak .  Symptoms are: unchanged.  Triage Disposition: See Physician Within 24 Hours  Patient/caregiver understands and will follow disposition?: No, wishes to speak with PCP  Message from [name] sent at 10/30/2024 12:45 PM EST  Reason for Triage: Patient is still experiencing cough and a lot of mucus and is not getting better.  Call back # [phone]   Reason for Disposition  [1] Continuous (nonstop) coughing interferes with work or school AND [2] no improvement using cough treatment per Care Advice  Answer Assessment - Initial Assessment Questions Productive cough for almost a month now. Patient has had XR chest, medrol  dosepak, phenergan  DM, and using home albuterol  and mucinex. No improvement in coughing and mucous production.   Stomach sore from coughing so much. Denies CP, Fever, Dizziness. Has mild baseline SOB that using inhaler helps.   Patient requesting refill of phenergan  DM. Asking if any other medication could be sent in to help. Pharmacy confirmed.  1. ONSET: When did the cough begin?      Almost a month  2. SEVERITY: How bad is the cough today?      Same if not worse  3. SPUTUM: Describe the color of your sputum (e.g., none, dry cough; clear, white, yellow, green)     Green/yellow  4. HEMOPTYSIS: Are you coughing up any blood? If Yes, ask: How much? (e.g., flecks, streaks, tablespoons, etc.)     Denies blood  5. DIFFICULTY BREATHING: Are you having difficulty breathing? If Yes, ask: How bad is it? (e.g., mild, moderate, severe)      Mild SOB- using inhaler  helps 6. FEVER: Do you have a fever? If Yes, ask: What is your temperature, how was it measured, and when did it start?     Denies  7. CARDIAC HISTORY: Do you have any history of heart disease? (e.g., heart attack, congestive heart failure)      HTN  8. LUNG HISTORY: Do you have any history of lung disease?  (e.g., pulmonary embolus, asthma, emphysema)     Reactive airway disease 9. PE RISK FACTORS: Do you have a history of blood clots? (or: recent major surgery, recent prolonged travel, bedridden)     denies 10. OTHER SYMPTOMS: Do you have any other symptoms? (e.g., runny nose, wheezing, chest pain)       Denies wheezing, CP  Protocols used: Cough - Acute Productive-A-AH

## 2024-10-31 MED ORDER — PROMETHAZINE-DM 6.25-15 MG/5ML PO SYRP: 5.0000 mL | ORAL_SOLUTION | Freq: Four times a day (QID) | ORAL | 0 refills | Status: AC | PRN

## 2024-10-31 NOTE — Telephone Encounter (Signed)
 Left a message for the patient to return my call.

## 2024-10-31 NOTE — Addendum Note (Signed)
 Addended by: MICHEAL WOLM ORN on: 10/31/2024 12:41 PM   Modules accepted: Orders

## 2024-10-31 NOTE — Telephone Encounter (Signed)
 I spoke with the patient and she reported Medrol  did not help and she is still experiencing post nasal drip but no GERD symptoms

## 2024-10-31 NOTE — Telephone Encounter (Signed)
 Sent in 1 refill of Promethazine  DM cough syrup.  Needs in office follow-up if symptoms persist  Wolm LELON Scarlet MD [facility]
